# Patient Record
Sex: Female | Born: 1940
Health system: Midwestern US, Community
[De-identification: ages and names within clinical notes are randomized; demographics above are authoritative.]

## PROBLEM LIST (undated history)

## (undated) DIAGNOSIS — T8543XA Leakage of breast prosthesis and implant, initial encounter: Secondary | ICD-10-CM

## (undated) DIAGNOSIS — M79604 Pain in right leg: Secondary | ICD-10-CM

## (undated) DIAGNOSIS — M1990 Unspecified osteoarthritis, unspecified site: Secondary | ICD-10-CM

## (undated) DIAGNOSIS — M159 Polyosteoarthritis, unspecified: Secondary | ICD-10-CM

## (undated) DIAGNOSIS — E7849 Other hyperlipidemia: Principal | ICD-10-CM

## (undated) DIAGNOSIS — R928 Other abnormal and inconclusive findings on diagnostic imaging of breast: Secondary | ICD-10-CM

## (undated) DIAGNOSIS — I10 Essential (primary) hypertension: Secondary | ICD-10-CM

## (undated) DIAGNOSIS — M0600 Rheumatoid arthritis without rheumatoid factor, unspecified site: Principal | ICD-10-CM

## (undated) DIAGNOSIS — D649 Anemia, unspecified: Secondary | ICD-10-CM

## (undated) DIAGNOSIS — G629 Polyneuropathy, unspecified: Secondary | ICD-10-CM

## (undated) DIAGNOSIS — Z79899 Other long term (current) drug therapy: Secondary | ICD-10-CM

## (undated) DIAGNOSIS — M79642 Pain in left hand: Secondary | ICD-10-CM

## (undated) DIAGNOSIS — E039 Hypothyroidism, unspecified: Principal | ICD-10-CM

## (undated) DIAGNOSIS — R5383 Other fatigue: Secondary | ICD-10-CM

## (undated) DIAGNOSIS — Z471 Aftercare following joint replacement surgery: Secondary | ICD-10-CM

## (undated) DIAGNOSIS — N1832 Chronic kidney disease, stage 3b (HCC): Principal | ICD-10-CM

## (undated) DIAGNOSIS — M19042 Primary osteoarthritis, left hand: Secondary | ICD-10-CM

## (undated) DIAGNOSIS — R200 Anesthesia of skin: Secondary | ICD-10-CM

## (undated) DIAGNOSIS — Z853 Personal history of malignant neoplasm of breast: Secondary | ICD-10-CM

## (undated) DIAGNOSIS — M79605 Pain in left leg: Secondary | ICD-10-CM

## (undated) DIAGNOSIS — N63 Unspecified lump in unspecified breast: Secondary | ICD-10-CM

## (undated) DIAGNOSIS — Z01818 Encounter for other preprocedural examination: Secondary | ICD-10-CM

## (undated) DIAGNOSIS — M150 Primary generalized (osteo)arthritis: Secondary | ICD-10-CM

## (undated) DIAGNOSIS — R635 Abnormal weight gain: Secondary | ICD-10-CM

## (undated) DIAGNOSIS — R7309 Other abnormal glucose: Secondary | ICD-10-CM

## (undated) DIAGNOSIS — M47816 Spondylosis without myelopathy or radiculopathy, lumbar region: Secondary | ICD-10-CM

## (undated) DIAGNOSIS — R202 Paresthesia of skin: Secondary | ICD-10-CM

## (undated) DIAGNOSIS — Z1239 Encounter for other screening for malignant neoplasm of breast: Secondary | ICD-10-CM

## (undated) DIAGNOSIS — B029 Zoster without complications: Secondary | ICD-10-CM

## (undated) DIAGNOSIS — M25442 Effusion, left hand: Secondary | ICD-10-CM

## (undated) DIAGNOSIS — D509 Iron deficiency anemia, unspecified: Secondary | ICD-10-CM

## (undated) DIAGNOSIS — G63 Polyneuropathy in diseases classified elsewhere: Secondary | ICD-10-CM

## (undated) DIAGNOSIS — M810 Age-related osteoporosis without current pathological fracture: Principal | ICD-10-CM

---

## 2009-03-16 LAB — URINALYSIS WITH MICROSCOPIC
Bilirubin, Urine: NEGATIVE
Blood, Urine: NEGATIVE
Glucose, UA: NEGATIVE mg/dl
Ketones, Urine: NEGATIVE mg/dl
Nitrite, Urine: POSITIVE
Protein, UA: NEGATIVE mg/dl
Specific Gravity, UA: 1.01
Urobilinogen, Urine: 0.2 EU/dl (ref <2.0)
pH, UA: 6 (ref 4.5–8.0)

## 2009-03-16 LAB — BASIC METABOLIC PANEL
BUN: 23 mg/dL — ABNORMAL HIGH (ref 7–18)
CO2: 31 meq/L (ref 21–32)
Calcium: 10 mg/dL (ref 8.3–10.6)
Chloride: 103 meq/L (ref 99–110)
Creatinine: 1 mg/dL (ref 0.6–1.2)
Est, Glom Filt Rate: 58 — ABNORMAL LOW (ref >60)
GFR Est, African/Amer: >60
Glucose: 106 mg/dL — ABNORMAL HIGH (ref 70–99)
Potassium: 4.3 meq/L (ref 3.5–5.1)
Sodium: 141 meq/L (ref 136–145)

## 2009-08-22 MED ORDER — POTASSIUM CHLORIDE CRYS ER 10 MEQ PO TBCR
10 | ORAL_TABLET | Freq: Every day | ORAL | 1.00 refills | 30.00 days | Status: AC
Start: 2009-08-22 — End: 2010-08-22

## 2009-08-22 MED ORDER — POTASSIUM CHLORIDE CRYS ER 10 MEQ PO TBCR
10 | ORAL_TABLET | Freq: Every day | ORAL | 1.00 refills | 30.00 days | Status: DC
Start: 2009-08-22 — End: 2009-08-22

## 2009-08-25 LAB — COMPREHENSIVE METABOLIC PANEL
ALT: 22 U/L (ref 10–50)
AST: 27 U/L (ref 5–34)
Albumin/Globulin Ratio: 1.6 (ref 1.0–2.0)
Albumin: 4.4 g/dL (ref 3.5–5.2)
Alkaline Phosphatase: 90 U/L (ref 42–98)
BUN: 19 mg/dL (ref 6–20)
CO2: 25 mEq/L (ref 21–31)
Calcium: 10 mg/dL (ref 8.6–10.5)
Chloride: 101 mEq/L (ref 98–107)
Creatinine: 1.2 mg/dL — ABNORMAL HIGH (ref 0.6–1.1)
Est, Glom Filt Rate: 47.2 — ABNORMAL LOW (ref >60.0)
Glucose: 92 mg/dL (ref 70–110)
Potassium: 3.8 mEq/L (ref 3.6–5.0)
Sodium: 142 mEq/L (ref 135–145)
Total Bilirubin: 0.3 mg/dL (ref 0.3–1.2)
Total Protein: 7.1 g/dL (ref 6.0–8.3)

## 2009-08-25 LAB — CHOLESTEROL, TOTAL: Cholesterol, Total: 211 mg/dL — ABNORMAL HIGH (ref 0–200)

## 2009-08-25 LAB — T4, FREE: T4 Free: 1.2 ng/dL (ref 0.9–1.8)

## 2009-08-25 LAB — LDL CHOLESTEROL, DIRECT: LDL Direct: 119 mg/dl — ABNORMAL HIGH (ref 0–99)

## 2009-08-25 LAB — TSH: TSH: 0.262 u[IU]/mL — ABNORMAL LOW (ref 0.400–4.00)

## 2009-11-01 MED ORDER — SYNTHROID 112 MCG PO TABS
112 | ORAL_TABLET | ORAL | 1.00 refills | 90.00 days | Status: DC
Start: 2009-11-01 — End: 2010-03-18

## 2009-12-13 MED ORDER — OMEPRAZOLE 20 MG PO CPDR
20 | ORAL_CAPSULE | ORAL | 1.00 refills | 90.00 days | Status: DC
Start: 2009-12-13 — End: 2010-07-17

## 2009-12-22 MED ORDER — TRIAMTERENE-HCTZ 37.5-25 MG PO CAPS
37.5-25 | ORAL_CAPSULE | ORAL | 1.00 refills | Status: DC
Start: 2009-12-22 — End: 2010-07-17

## 2010-02-02 LAB — BASIC METABOLIC PANEL
BUN: 22 mg/dl — ABNORMAL HIGH (ref 7–18)
CO2: 29 mEq/L (ref 21–32)
Calcium: 10.2 mg/dl (ref 8.3–10.6)
Chloride: 102 mEq/L (ref 99–110)
Creatinine: 1.2 mg/dl (ref 0.6–1.2)
GFR Est, African/Amer: 57 — ABNORMAL LOW
GFR, Estimated: 47 — ABNORMAL LOW (ref >60)
Glucose: 103 mg/dl — ABNORMAL HIGH (ref 70–99)
Potassium: 3.7 mEq/L (ref 3.5–5.1)
Sodium: 138 mEq/L (ref 136–145)

## 2010-02-02 LAB — CBC WITH AUTO DIFFERENTIAL
Basophils %: 1 % (ref 0.0–2.0)
Basophils Absolute: 0.1 10*3 (ref 0.0–0.2)
Eosinophils %: 4.3 % (ref 0.0–5.0)
Eosinophils Absolute: 0.3 10*3 (ref 0.0–0.6)
Granulocyte Absolute Count: 4.2 10*3 (ref 1.7–7.7)
Hematocrit: 40.9 % (ref 36.0–48.0)
Hemoglobin: 13.7 gm/dl (ref 12.0–16.0)
Lymphocytes %: 27.4 % (ref 25.0–40.0)
Lymphocytes Absolute: 1.9 10*3 (ref 1.0–5.1)
MCH: 29.6 pg (ref 26–34)
MCHC: 33.6 gm/dl (ref 31–36)
MCV: 88.1 fl (ref 80–100)
MPV: 8.5 fl (ref 5.0–10.5)
Monocytes %: 6.9 % (ref 0.0–12.0)
Monocytes Absolute: 0.5 10*3 (ref 0.0–1.3)
Platelets: 262 10*3 (ref 135–450)
RBC: 4.64 10*6 (ref 4.0–5.2)
RDW: 12.1 % (ref 11.5–14.5)
Segs Relative: 60.4 % (ref 42.0–63.0)
WBC: 7 10*3 (ref 4.0–11.0)

## 2010-02-02 LAB — CK: Total CK: 207 U/L — ABNORMAL HIGH (ref 26–140)

## 2010-02-02 LAB — TROPONIN: Troponin I: <0.05 ng/ml (ref <0.39)

## 2010-02-02 LAB — CK-MB INDEX: CK-MB: 1.4 ng/ml (ref <6.0)

## 2010-02-02 NOTE — ED Provider Notes (Unsigned)
PATIENT NAME:                 PA #:            MR #Jodi Walls, Jodi Walls                [id number]       [id number]            EMERGENCY ROOM PHYSICIAN:                ADM DATE:                    Ralene Cork, MD              02/02/2010                   DATE OF BIRTH:   AGE:           PATIENT TYPE:     RM #:              February 19, 1941       69             MEQ                                     CHIEF COMPLAINT:  Chest pain.     HISTORY OF PRESENT ILLNESS:  The patient is a 69 year old female who comes in  with chest pain with movement to her chest wall this morning.  It happened  around 10:00.  It is diffusely across her chest with movement.  There was  some pain to the upper part of her back.  Of significance is that the patient  was doing a lot of carrying of mulch yesterday and a lot of gardening as the  weather was very nice yesterday.  Otherwise, no other complaints are noted.   Pain level is approximately 4/10.  No shortness of breath; able to eat and  drink without any problems.  She has already taken her aspirin this morning  around 8:00.     ALLERGIES:  CODEINE.     PAST MEDICAL/SURGICAL HISTORY:  Hypertension and breast CA, and mastectomy.     SOCIAL HISTORY:  Negative smoke.  Occasional EtOH.  Lives with family.     FAMILY HISTORY:  Positive for hypertension.     REVIEW OF SYSTEMS:  GENERAL:  No fever, chills, malaise.  NEUROLOGIC:  No headache, visual disturbance.  CARDIOVASCULAR/RESPIRATORY:  Has had chest wall pain.  GASTROINTESTINAL:  No nausea, vomiting, diarrhea or constipation.  GENITOURINARY:  No dysuria.  MUSCULOSKELETAL:  No calf swelling or pain.  SKIN:  Negative for rash.  PSYCHIATRIC:  Negative for anxiety.     The rest of the systems were reviewed and negative.     PHYSICAL EXAMINATION:  GENERAL:  The patient is awake, alert, and interactive.  VITAL SIGNS:  Blood pressure 153/76, pulse 62, respirations 16, temperature  98.  Pulse oximetry 99%.  Pain level is  4/10.  HEENT:  Head normocephalic, atraumatic.  EOMI.  PERRLA.  Ears clear.  Nose  clear.  Oropharynx clear.  The patient uvula is midline.   NECK:  Supple.  There are no meningeal signs.  No cervical point tenderness  noted.  LUNGS:  Bilateral breath  sounds.  No wheeze.  CARDIOVASCULAR:  RRR.  No murmurs, rubs or gallops noted.   CHEST:  Chest wall has reproducible pain.  No stepoff noted.  ABDOMEN:  Soft, nontender, nondistended.  Positive bowel sounds x4.  No  rebound.  No pulsatile masses noted.  EXTREMITIES:  Positive pulses, equal bilaterally.  No edema.   NEUROLOGIC:  Cranial nerves II-XII grossly intact.  No focal deficits noted.     DIFFERENTIAL DIAGNOSIS:  Chest pain versus chest wall pain.     MEDICAL DECISION MAKING/EMERGENCY DEPARTMENT COURSE:  The patient had EKG,  interpreted by me.  It shows normal sinus rhythm with a rate of 64.  I do not  appreciate any ST elevations noted.  This is compared to an old EKG  previously.  The patient had a chest x-ray that was read by Radiology as  negative.  The patient had blood work obtained.  It showed her CPK was  somewhat elevated, but she was doing a lot of manual labor with carrying  mulch and doing a lot of gardening.  It was 207.  Her MB was 1.4.  The ratio  was low.  Her troponin was less than 0.05.  Her BUN is 22, creatinine 1.2.   Potassium is 3.7.  The patients glucose is 103.  The patients white count is  7, hemoglobin 13.7 and hematocrit 40.9, so all her tests were basically  within normal limits.  She was monitored.  She felt fine except for when she  moved around, or with direct palpation to her chest.  In light of this, the  patient will be given Toradol 15 mg IV and will be discharged home on  ibuprofen or Tylenol, cool packs, and given written and verbal instructions.   She states that she understands and will comply and will follow up with her  doctor.  See chart for details.                                            [employer] [facility] [employer] Geoffry Paradise,  [place]     [id number]  DD: 02/02/2010   DT: 02/02/2010 15:51   Job #: [id number]  CC:

## 2010-02-20 MED ORDER — ATENOLOL 25 MG PO TABS
25 | ORAL_TABLET | ORAL | 1.00 refills | 90.00 days | Status: DC
Start: 2010-02-20 — End: 2010-09-14

## 2010-02-20 MED ORDER — POTASSIUM CHLORIDE CRYS ER 10 MEQ PO TBCR
10 | ORAL_TABLET | ORAL | 1.00 refills | 30.00 days | Status: DC
Start: 2010-02-20 — End: 2010-05-17

## 2010-03-20 MED ORDER — SYNTHROID 112 MCG PO TABS
112 | ORAL_TABLET | ORAL | 1.00 refills | 90.00 days | Status: DC
Start: 2010-03-20 — End: 2010-06-18

## 2010-04-06 MED ORDER — ALPRAZOLAM 0.25 MG PO TABS
0.25 | ORAL_TABLET | Freq: Two times a day (BID) | ORAL | 0.00 refills | 30.00 days | Status: DC | PRN
Start: 2010-04-06 — End: 2010-08-24

## 2010-04-06 NOTE — Progress Notes (Signed)
Subjective:      Patient ID: Jodi Walls is a 69 y.o. female.    HPI Comments: She is well and not c/o. She did have some atypical chest pain. Was in the er. All ok. She has not decided on the colon check still.   She did want a script for xanax to have at home.       Review of Systems   Constitutional: Negative for fever, appetite change, fatigue and unexpected weight change.   Respiratory: Negative for chest tightness and shortness of breath.    Cardiovascular: Negative for chest pain, palpitations and leg swelling.   Gastrointestinal: Negative for nausea, vomiting, abdominal pain, diarrhea, constipation, blood in stool and abdominal distention.   Genitourinary: Negative for dysuria, hematuria and difficulty urinating.   Neurological: Negative for headaches.       Objective:   Physical Exam   Constitutional: She appears well-developed and well-nourished. No distress.   Neck: No mass and no thyromegaly present.   Cardiovascular: Normal rate, regular rhythm and normal heart sounds.  Exam reveals no gallop and no friction rub.    No murmur heard.            Pulmonary/Chest: Effort normal and breath sounds normal. No respiratory distress. She has no wheezes. She has no rales.   Abdominal: Soft. She exhibits no distension and no mass. No tenderness. She has no guarding.   Musculoskeletal: She exhibits no edema.   Neurological: She is alert.   Skin: Skin is warm and dry. She is not diaphoretic.   Psychiatric: She has a normal mood and affect.       Assessment:      1. Essential hypertension, benign  Basic metabolic panel   2. Other and unspecified hyperlipidemia  Lipid panel   3. Unspecified hypothyroidism  T4, free, TSH           Plan:      She is advised to get the colon check. She was told of side effects of the xanax. See in about 4 months

## 2010-04-07 LAB — TSH
T4 Free: 1.44 ng/dl (ref 0.9–1.8)
TSH: 0.34 u[IU]/mL — ABNORMAL LOW (ref 0.35–5.5)

## 2010-04-07 LAB — LIPID PANEL
Cholesterol, Total: 224 mg/dl — ABNORMAL HIGH (ref <200)
HDL: 78 mg/dl — ABNORMAL HIGH (ref 40–60)
LDL Calculated: 126 mg/dl — ABNORMAL HIGH (ref <100)
Triglycerides: 97 mg/dl (ref <150)
VLDL Cholesterol Calculated: 19 mg/dl

## 2010-04-07 LAB — BASIC METABOLIC PANEL
BUN: 21 mg/dl — ABNORMAL HIGH (ref 7–18)
CO2: 30 mEq/L (ref 21–32)
Calcium: 9.8 mg/dl (ref 8.3–10.6)
Chloride: 102 mEq/L (ref 99–110)
Creatinine: 1.1 mg/dl (ref 0.6–1.2)
Est, Glom Filt Rate: 52 — ABNORMAL LOW (ref >60)
GFR Est, African/Amer: >60
Glucose: 111 mg/dl — ABNORMAL HIGH (ref 70–99)
Potassium: 4.1 mEq/L (ref 3.5–5.1)
Sodium: 143 mEq/L (ref 136–145)

## 2010-04-07 LAB — T4, FREE: T4 Free: 1.44 ng/dl (ref 0.9–1.8)

## 2010-05-18 MED ORDER — POTASSIUM CHLORIDE ER 10 MEQ PO TBCR
10 | ORAL_TABLET | ORAL | 1.00 refills | 30.00 days | Status: DC
Start: 2010-05-18 — End: 2011-02-15

## 2010-06-19 MED ORDER — LEVOTHYROXINE SODIUM 112 MCG PO TABS
112 | ORAL_TABLET | ORAL | 1.00 refills | 90.00 days | Status: DC
Start: 2010-06-19 — End: 2010-10-23

## 2010-07-17 MED ORDER — TRIAMTERENE-HCTZ 37.5-25 MG PO CAPS
37.5-25 | ORAL_CAPSULE | ORAL | 1.00 refills | Status: DC
Start: 2010-07-17 — End: 2011-01-16

## 2010-07-17 MED ORDER — OMEPRAZOLE 20 MG PO CPDR
20 | ORAL_CAPSULE | ORAL | 1.00 refills | 90.00 days | Status: DC
Start: 2010-07-17 — End: 2011-01-16

## 2010-08-24 MED ORDER — ALPRAZOLAM 0.25 MG PO TABS
0.25 | ORAL_TABLET | Freq: Two times a day (BID) | ORAL | 0.00 refills | 30.00 days | Status: DC | PRN
Start: 2010-08-24 — End: 2011-01-18

## 2010-08-24 NOTE — Progress Notes (Signed)
 Subjective:      Patient ID: Jodi Walls is a 70 y.o. female.    HPI Comments: Patient presents with:    Check-Up - pt fasting    She is well and no c/o.  She had a mammogram that was fine     height is 5' 2 (1.575 m) and weight is 210 lb (95.255 kg). Her oral temperature is 98.1 F (36.7 C). Her blood pressure is 138/80 and her pulse is 76.     Hx reviewed        Review of Systems   Constitutional: Negative for fever, chills, appetite change and unexpected weight change.   HENT: Negative for neck pain.    Eyes: Negative for visual disturbance.   Respiratory: Negative for cough, chest tightness and shortness of breath.    Cardiovascular: Negative for chest pain, palpitations and leg swelling.   Gastrointestinal: Negative for nausea, vomiting, abdominal pain, diarrhea, constipation, blood in stool and abdominal distention.   Genitourinary: Negative for dysuria, frequency, hematuria and difficulty urinating.   Neurological: Negative for headaches.       Objective:   Physical Exam   Constitutional: She appears well-developed and well-nourished. No distress.   Eyes: Conjunctivae are normal. No scleral icterus.   Neck: Carotid bruit is not present. No mass and no thyromegaly present.   Cardiovascular: Normal rate, regular rhythm and normal heart sounds.  Exam reveals no gallop and no friction rub.    No murmur heard.       No edema   Pulmonary/Chest: Effort normal and breath sounds normal. No respiratory distress. She has no wheezes. She has no rales.   Lymphadenopathy:     She has no cervical adenopathy.        Right: No supraclavicular adenopathy present.        Left: No supraclavicular adenopathy present.   Neurological: She is alert.   Skin: Skin is warm, dry and intact. She is not diaphoretic. No cyanosis. No pallor. Nails show no clubbing.   Psychiatric: She has a normal mood and affect. Her speech is normal.       Assessment:      1. Essential hypertension, benign  Comprehensive metabolic panel   2.  Unspecified hypothyroidism  T4, free, TSH   3. Other and unspecified hyperlipidemia  Cholesterol, total, Triglyceride           Plan:      Check the blood  We will have to write the mammogram in the future  See about 6 months

## 2010-08-25 LAB — COMPREHENSIVE METABOLIC PANEL
ALT: 32 U/L (ref 10–40)
AST: 36 U/L (ref 15–37)
Albumin/Globulin Ratio: 1.7 (ref 1.1–2.2)
Albumin: 4.5 gm/dl (ref 3.4–5.0)
Alkaline Phosphatase: 102 U/L (ref 45–129)
BUN: 18 mg/dl (ref 7–18)
CO2: 28 mEq/L (ref 21–32)
Calcium: 10.1 mg/dl (ref 8.3–10.6)
Chloride: 104 mEq/L (ref 99–110)
Creatinine: 1.1 mg/dl (ref 0.6–1.2)
Est, Glom Filt Rate: 52 — ABNORMAL LOW (ref >60)
GFR Est, African/Amer: >60
Glucose: 110 mg/dl — ABNORMAL HIGH (ref 70–99)
Potassium: 3.9 mEq/L (ref 3.5–5.1)
Sodium: 142 mEq/L (ref 136–145)
Total Bilirubin: 0.5 mg/dl (ref 0.0–1.0)
Total Protein: 7.2 gm/dl (ref 6.4–8.2)

## 2010-08-25 LAB — TSH
T4 Free: 1.61 ng/dl (ref 0.9–1.8)
TSH: 0.05 u[IU]/mL — ABNORMAL LOW (ref 0.35–5.5)

## 2010-08-25 LAB — T4, FREE: T4 Free: 1.61 ng/dl (ref 0.9–1.8)

## 2010-08-25 LAB — CHOLESTEROL, TOTAL: Cholesterol, Total: 232 mg/dl — ABNORMAL HIGH (ref <200)

## 2010-08-25 LAB — TRIGLYCERIDES: Triglycerides: 77 mg/dl (ref <150)

## 2010-09-14 MED ORDER — ATENOLOL 25 MG PO TABS
25 | ORAL_TABLET | ORAL | 1.00 refills | 90.00 days | Status: DC
Start: 2010-09-14 — End: 2011-03-19

## 2010-10-23 MED ORDER — LEVOTHYROXINE SODIUM 100 MCG PO TABS
100 MCG | ORAL_TABLET | Freq: Every day | ORAL | Status: DC
Start: 2010-10-23 — End: 2011-02-23

## 2010-10-23 NOTE — Telephone Encounter (Signed)
Pt said we can call the thyroid med into [facility] in [place].

## 2011-01-16 MED ORDER — OMEPRAZOLE 20 MG PO CPDR
20 MG | ORAL_CAPSULE | ORAL | Status: DC
Start: 2011-01-16 — End: 2011-06-18

## 2011-01-16 MED ORDER — TRIAMTERENE-HCTZ 37.5-25 MG PO CAPS
37.5-25 | ORAL_CAPSULE | ORAL | 1.00 refills | Status: DC
Start: 2011-01-16 — End: 2011-06-18

## 2011-01-18 MED ORDER — ALPRAZOLAM 0.25 MG PO TABS
0.25 MG | ORAL_TABLET | Freq: Two times a day (BID) | ORAL | Status: DC | PRN
Start: 2011-01-18 — End: 2011-08-21

## 2011-01-18 NOTE — Telephone Encounter (Signed)
Pharmacy called.

## 2011-02-15 MED ORDER — POTASSIUM CHLORIDE ER 10 MEQ PO TBCR
10 | ORAL_TABLET | ORAL | 1.00 refills | 30.00 days | Status: DC
Start: 2011-02-15 — End: 2011-08-22

## 2011-02-22 NOTE — Progress Notes (Signed)
Subjective:      Patient ID: Jodi Walls is a 70 y.o. female.    HPI Comments: Patient presents with:    Check-Up    She is well and is wanting flu shot  No pb with the flu shot in the  Past  Dr fox wants to replace the right knee. Patient feels well now and she is not sure she wants    ----------------------------                02/22/11                       1116         ----------------------------   BP:           122/78         Pulse:          60           Temp:   97.9 F (36.6 C)    TempSrc:       Oral          Height:  5\' 2"  (1.575 m)     Weight: 206 lb (93.441 kg)  ----------------------------  Wt Readings from Last 3 Encounters:  02/22/11 : 206 lb (93.441 kg)  08/24/10 : 210 lb (95.255 kg)  04/06/10 : 208 lb (94.348 kg)  BP Readings from Last 3 Encounters:  02/22/11 : 122/78  08/24/10 : 138/80  04/06/10 : 140/80        Review of Systems   Constitutional: Negative for fever, chills, appetite change and unexpected weight change.   Eyes: Negative for visual disturbance.   Respiratory: Negative for cough, chest tightness and shortness of breath.    Cardiovascular: Negative for chest pain, palpitations and leg swelling.   Gastrointestinal: Negative for nausea, vomiting, abdominal pain, diarrhea, constipation, blood in stool and abdominal distention.        No gerd no dysphagia   Genitourinary: Negative for dysuria, frequency, hematuria and difficulty urinating.   Neurological: Negative for dizziness, light-headedness and headaches.       Objective:   Physical Exam   Constitutional: She appears well-developed and well-nourished. No distress.   HENT:   Head: Normocephalic and atraumatic.   Mouth/Throat: Uvula is midline, oropharynx is clear and moist and mucous membranes are normal.   Eyes: Conjunctivae and lids are normal. No scleral icterus.   Neck: Neck supple. Carotid bruit is not present. No mass and no thyromegaly present.   Cardiovascular: Normal rate, regular rhythm and  normal heart sounds.  Exam reveals no gallop and no friction rub.    No murmur heard.         No edema legs   Pulmonary/Chest: Effort normal and breath sounds normal. No respiratory distress. She has no wheezes. She has no rales.   Abdominal: Soft. Normal appearance and bowel sounds are normal. She exhibits no distension, no abdominal bruit and no mass. There is no hepatosplenomegaly. There is no tenderness. There is no guarding.   Lymphadenopathy:        Head (right side): No submental, no submandibular, no preauricular and no posterior auricular adenopathy present.        Head (left side): No submental, no submandibular, no preauricular and no posterior auricular adenopathy present.     She has no cervical adenopathy.        Right: No supraclavicular adenopathy present.        Left: No supraclavicular  adenopathy present.   Neurological: She is alert.   Skin: Skin is warm, dry and intact. She is not diaphoretic. No cyanosis. No pallor. Nails show no clubbing.   Psychiatric: She has a normal mood and affect. Her speech is normal.       Assessment:      1. Essential hypertension, benign  Comprehensive metabolic panel   2. Unspecified hypothyroidism  T4, free, TSH   3. Other and unspecified hyperlipidemia  Comprehensive metabolic panel, TSH, Triglyceride, Cholesterol, total   4. Immunization, active; influenza virus vaccine  Flu Vaccine, High Dose Flu Zone IM           Plan:      Flu shot   See in 6 months  Stay with the diet  No change with the diet

## 2011-02-23 LAB — COMPREHENSIVE METABOLIC PANEL
ALT: 26 U/L (ref 10–40)
AST: 33 U/L (ref 15–37)
Albumin/Globulin Ratio: 1.7 (ref 1.1–2.2)
Albumin: 4.4 gm/dl (ref 3.4–5.0)
Alkaline Phosphatase: 96 U/L (ref 45–129)
BUN: 24 mg/dl — ABNORMAL HIGH (ref 7–18)
CO2: 29 mEq/L (ref 21–32)
Calcium: 9.6 mg/dl (ref 8.3–10.6)
Chloride: 105 mEq/L (ref 99–110)
Creatinine: 1.2 mg/dl (ref 0.6–1.2)
Est, Glom Filt Rate: 47 — ABNORMAL LOW (ref >60)
GFR Est, African/Amer: 57 — ABNORMAL LOW
Glucose: 103 mg/dl — ABNORMAL HIGH (ref 70–99)
Potassium: 4.7 mEq/L (ref 3.5–5.1)
Sodium: 142 mEq/L (ref 136–145)
Total Bilirubin: 0.6 mg/dl (ref 0.0–1.0)
Total Protein: 7.1 gm/dl (ref 6.4–8.2)

## 2011-02-23 LAB — CHOLESTEROL, TOTAL: Cholesterol, Total: 224 mg/dl — ABNORMAL HIGH (ref <200)

## 2011-02-23 LAB — T4, FREE: T4 Free: 1.57 ng/dl (ref 0.9–1.8)

## 2011-02-23 LAB — TSH
T4 Free: 1.57 ng/dl (ref 0.9–1.8)
TSH: 0.19 u[IU]/mL — ABNORMAL LOW (ref 0.35–5.5)

## 2011-02-23 LAB — TRIGLYCERIDES: Triglycerides: 80 mg/dl (ref <150)

## 2011-02-23 MED ORDER — LEVOTHYROXINE SODIUM 75 MCG PO TABS
75 MCG | ORAL_TABLET | Freq: Every day | ORAL | Status: DC
Start: 2011-02-23 — End: 2011-08-15

## 2011-02-23 NOTE — Telephone Encounter (Signed)
Spoke to pt about her results, she said she would need new thyroid med called into [facility] in [place]. ( levothyroxine 75mg )

## 2011-03-19 MED ORDER — ATENOLOL 25 MG PO TABS
25 | ORAL_TABLET | ORAL | 1.00 refills | 90.00 days | Status: DC
Start: 2011-03-19 — End: 2011-08-15

## 2011-06-18 MED ORDER — TRIAMTERENE-HCTZ 37.5-25 MG PO CAPS
37.5-25 | ORAL_CAPSULE | ORAL | 1.00 refills | Status: DC
Start: 2011-06-18 — End: 2011-09-19

## 2011-06-18 MED ORDER — OMEPRAZOLE 20 MG PO CPDR
20 MG | ORAL_CAPSULE | ORAL | Status: DC
Start: 2011-06-18 — End: 2011-09-19

## 2011-08-16 MED ORDER — LEVOTHYROXINE SODIUM 75 MCG PO TABS
75 MCG | ORAL_TABLET | ORAL | Status: DC
Start: 2011-08-16 — End: 2011-09-19

## 2011-08-16 MED ORDER — ATENOLOL 25 MG PO TABS
25 | ORAL_TABLET | ORAL | 1.00 refills | 90.00 days | Status: DC
Start: 2011-08-16 — End: 2011-09-19

## 2011-08-21 MED ORDER — ALPRAZOLAM 0.25 MG PO TABS
0.25 MG | ORAL_TABLET | Freq: Two times a day (BID) | ORAL | Status: DC | PRN
Start: 2011-08-21 — End: 2012-12-17

## 2011-08-23 LAB — URINALYSIS WITH MICROSCOPIC
Bilirubin Urine: NEGATIVE
Blood, Urine: NEGATIVE
Glucose, Ur: NEGATIVE mg/dl
Ketones, Urine: NEGATIVE mg/dl
Nitrite, Urine: NEGATIVE
Protein, UA: NEGATIVE mg/dl
Specific Gravity, UA: 1.015 (ref 1.005–1.030)
Urobilinogen, Urine: 0.2 EU/dl (ref <2.0)
pH, UA: 6.5 (ref 4.5–8.0)

## 2011-08-23 MED ORDER — GABAPENTIN 300 MG PO CAPS
300 | ORAL_CAPSULE | Freq: Every day | ORAL | 1.00 refills | 30.00 days | Status: DC
Start: 2011-08-23 — End: 2011-11-26

## 2011-08-23 MED ORDER — POTASSIUM CHLORIDE ER 10 MEQ PO TBCR
10 | ORAL_TABLET | ORAL | 1.00 refills | 30.00 days | Status: AC
Start: 2011-08-23 — End: ?

## 2011-08-23 NOTE — Progress Notes (Signed)
Subjective:      Patient ID: Jodi Walls is a 71 y.o. female.    HPI Comments: Patient presents with:    Check-Up - pt fasting    She has leg cramps mainly at night  Going on for more than 6 months  She drinks some club soda and that will help sometimes    She wonders if the knees could be doing it as she is supposed to have surgery  She has not done the colon test    Date of Birth:  [birth year]    Date of Visit:  08/23/2011     -- Codeine -- Nausea And Vomiting    Current Outpatient Prescriptions:  gabapentin (NEURONTIN) 300 MG capsule, Take 1 capsule by mouth Daily with supper., Disp: 30 capsule, Rfl: 3  ALPRAZolam (XANAX) 0.25 MG tablet, Take 1 tablet by mouth 2 times daily as needed for Anxiety., Disp: 30 tablet, Rfl: 0  levothyroxine (SYNTHROID) 75 MCG tablet, TAKE ONE TABLET BY MOUTH ONCE A DAY, Disp: 30 tablet, Rfl: 3  atenolol (TENORMIN) 25 MG tablet, TAKE ONE TABLET BY MOUTH ONCE A DAY, Disp: 30 tablet, Rfl: 3  triamterene-hydrochlorothiazide (DYAZIDE) 37.5-25 MG per capsule, TAKE ONE CAPSULE BY MOUTH ONCE A DAY, Disp: 30 capsule, Rfl: 3  omeprazole (PRILOSEC) 20 MG capsule, TAKE ONE CAPSULE BY MOUTH ONCE A DAY, Disp: 30 capsule, Rfl: 3  traMADol (ULTRAM) 50 MG tablet, Take 50 mg by mouth 2 times daily as needed.    Indications: Knee Pain, Disp: , Rfl:     No current facility-administered medications for this visit.      ----------------------------                08/23/11                       1019         ----------------------------   BP:           128/68         Pulse:          68           Temp:   97.8 F (36.6 C)    TempSrc:       Oral          Height:  5\' 2"  (1.575 m)     Weight: 218 lb (98.884 kg)  ----------------------------  Body mass index is 39.86 kg/(m^2).     Wt Readings from Last 3 Encounters:  08/23/11 : 218 lb (98.884 kg)  02/22/11 : 206 lb (93.441 kg)  08/24/10 : 210 lb (95.255 kg)  BP Readings from Last 3 Encounters:  08/23/11 : 128/68  02/22/11 :  122/78  08/24/10 : 138/80          Review of Systems   Constitutional: Negative for fever, chills, appetite change and unexpected weight change.   Eyes: Negative for visual disturbance.   Respiratory: Negative for cough, chest tightness and shortness of breath.    Cardiovascular: Negative for chest pain, palpitations and leg swelling.   Gastrointestinal: Negative for nausea, vomiting, abdominal pain, diarrhea, constipation, blood in stool and abdominal distention.        No gerd no dysphagia   Endocrine: Negative for polydipsia and polyuria.   Genitourinary: Negative for dysuria, frequency, hematuria and difficulty urinating.   Neurological: Negative for dizziness, light-headedness and headaches.       Objective:   Physical Exam   Constitutional: She  appears well-developed and well-nourished. No distress.   Neck: Full passive range of motion without pain. Neck supple. Carotid bruit is not present. No mass and no thyromegaly present.   Cardiovascular: Normal rate, regular rhythm and normal heart sounds.  Exam reveals no gallop and no friction rub.    No murmur heard.  Pulses:       Dorsalis pedis pulses are 2+ on the right side, and 2+ on the left side.        Posterior tibial pulses are 2+ on the right side, and 2+ on the left side.     No edema   Pulmonary/Chest: Effort normal and breath sounds normal. No accessory muscle usage. Not tachypneic. No respiratory distress. She has no decreased breath sounds. She has no wheezes. She has no rhonchi. She has no rales.   Abdominal: Soft. Normal appearance and bowel sounds are normal. She exhibits no distension, no abdominal bruit and no mass. There is no hepatosplenomegaly. There is no tenderness. There is no guarding and no tenderness at McBurney's point.   Musculoskeletal:   Feet pink warm and dry   Lymphadenopathy:        Head (right side): No submental, no submandibular, no preauricular and no posterior auricular adenopathy present.        Head (left side): No  submental, no submandibular, no preauricular and no posterior auricular adenopathy present.     She has no cervical adenopathy.        Right: No supraclavicular adenopathy present.        Left: No supraclavicular adenopathy present.   Neurological: She is alert.   Reflex Scores:       Patellar reflexes are 2+ on the right side and 2+ on the left side.       Achilles reflexes are 2+ on the right side and 2+ on the left side.  Skin: Skin is warm, dry and intact. No rash noted. She is not diaphoretic. No cyanosis. No pallor. Nails show no clubbing.   Psychiatric: She has a normal mood and affect. Her speech is normal.       Assessment:      1. Essential hypertension, benign  Comprehensive metabolic panel, Lipid panel, CBC, TSH, Urinalysis with microscopic   2. Unspecified hypothyroidism  TSH, T4, free   3. Other and unspecified hyperlipidemia  Comprehensive metabolic panel, Lipid panel, TSH   4. HX: breast cancer  Comprehensive metabolic panel, CBC, MAM Dexa Bone Density Scan   5. Postmenopausal status  MAM Dexa Bone Density Scan           Plan:      Wrote for the mammogram and copy to her doctor dr Elwin Mocha  Trial of neurontin at night  See [name] in about 4 months

## 2011-08-24 LAB — COMPREHENSIVE METABOLIC PANEL
ALT: 26 U/L (ref 10–40)
AST: 32 U/L (ref 15–37)
Albumin/Globulin Ratio: 1.5 (ref 1.1–2.2)
Albumin: 4.3 gm/dl (ref 3.4–5.0)
Alkaline Phosphatase: 108 U/L (ref 45–129)
BUN: 24 mg/dl — ABNORMAL HIGH (ref 7–18)
CO2: 35 mEq/L — ABNORMAL HIGH (ref 21–32)
Calcium: 10 mg/dl (ref 8.3–10.6)
Chloride: 104 mEq/L (ref 99–110)
Creatinine: 1.4 mg/dl — ABNORMAL HIGH (ref 0.6–1.2)
Est, Glom Filt Rate: 39 — ABNORMAL LOW (ref >60)
GFR Est, African/Amer: 48 — ABNORMAL LOW
Glucose: 110 mg/dl — ABNORMAL HIGH (ref 70–99)
Potassium: 4.5 mEq/L (ref 3.5–5.1)
Sodium: 142 mEq/L (ref 136–145)
Total Bilirubin: 0.5 mg/dl (ref 0.0–1.0)
Total Protein: 7.1 gm/dl (ref 6.4–8.2)

## 2011-08-24 LAB — CBC
Hematocrit: 41.4 % (ref 36.0–48.0)
Hemoglobin: 13.4 gm/dl (ref 12.0–16.0)
MCH: 28.7 pg (ref 26–34)
MCHC: 32.4 gm/dl (ref 31–36)
MCV: 88.5 fl (ref 80–100)
MPV: 10.4 fl (ref 5.0–10.5)
Platelets: 251 10*3 (ref 135–450)
RBC: 4.68 10*6 (ref 4.0–5.2)
RDW: 13.9 % (ref 11.5–14.5)
WBC: 6.6 10*3 (ref 4.0–11.0)

## 2011-08-24 LAB — LIPID PANEL
Cholesterol, Total: 231 mg/dl — ABNORMAL HIGH (ref <200)
HDL: 76 mg/dl — ABNORMAL HIGH (ref 40–60)
LDL Calculated: 136 mg/dl — ABNORMAL HIGH (ref <100)
Triglycerides: 95 mg/dl (ref <150)
VLDL Cholesterol Calculated: 19 mg/dl

## 2011-08-24 LAB — TSH
T4 Free: 1.02 ng/dl (ref 0.9–1.8)
TSH: 4.37 u[IU]/mL (ref 0.35–5.5)

## 2011-08-24 LAB — T4, FREE: T4 Free: 1.02 ng/dl (ref 0.9–1.8)

## 2011-09-20 MED ORDER — ATENOLOL 25 MG PO TABS
25 | ORAL_TABLET | ORAL | 1.00 refills | 90.00 days | Status: DC
Start: 2011-09-20 — End: 2012-03-18

## 2011-09-20 MED ORDER — POTASSIUM CHLORIDE CRYS ER 10 MEQ PO TBCR
10 | ORAL_TABLET | ORAL | 1.00 refills | 30.00 days | Status: DC
Start: 2011-09-20 — End: 2012-04-21

## 2011-09-20 MED ORDER — LEVOTHYROXINE SODIUM 75 MCG PO TABS
75 MCG | ORAL_TABLET | ORAL | Status: DC
Start: 2011-09-20 — End: 2011-12-24

## 2011-09-20 MED ORDER — OMEPRAZOLE 20 MG PO CPDR
20 MG | ORAL_CAPSULE | ORAL | Status: DC
Start: 2011-09-20 — End: 2012-04-16

## 2011-09-20 MED ORDER — TRIAMTERENE-HCTZ 37.5-25 MG PO CAPS
37.5-25 | ORAL_CAPSULE | ORAL | 1.00 refills | Status: DC
Start: 2011-09-20 — End: 2012-06-12

## 2011-10-11 NOTE — Patient Instructions (Signed)
Do take fluids  Check the blood  See [name] in 4 months

## 2011-10-11 NOTE — Progress Notes (Signed)
 Subjective:      Patient ID: Jodi Walls is a 71 y.o. female.    HPI Comments: Patient presents with:    Follow-up    The gabapentin  did help the leg cramps  It also helps with sleep    C/o appetite and weight gain     height is 5' 2 (1.575 m) and weight is 219 lb (99.338 kg). Her oral temperature is 98 F (36.7 C). Her blood pressure is 128/70.           Review of Systems   Constitutional: Negative for fever, chills, appetite change and unexpected weight change.   Respiratory: Negative for cough and shortness of breath.    Cardiovascular: Negative for chest pain.   Neurological: Negative for headaches.       Objective:   Physical Exam   Constitutional: She appears well-developed and well-nourished. No distress.   Cardiovascular: Normal rate, regular rhythm and normal heart sounds.  Exam reveals no gallop and no friction rub.    No murmur heard.  Pulmonary/Chest: Effort normal and breath sounds normal. No accessory muscle usage. Not tachypneic. No respiratory distress. She has no decreased breath sounds. She has no wheezes. She has no rhonchi. She has no rales.   Lymphadenopathy:     She has no cervical adenopathy.        Right: No supraclavicular adenopathy present.        Left: No supraclavicular adenopathy present.   Neurological: She is alert.   Skin: Skin is warm, dry and intact. She is not diaphoretic. No pallor.       Assessment:      1. Essential hypertension, benign  Basic Metabolic Panel   2. Weight gain  Basic Metabolic Panel, Hemoglobin A1c   3. Elevated glucose  Basic Metabolic Panel, Hemoglobin A1c           Plan:      Do take fluids  Check the blood  See us  in 4 months

## 2011-10-12 LAB — BASIC METABOLIC PANEL
BUN: 21 mg/dl — ABNORMAL HIGH (ref 7–18)
CO2: 32 mEq/L (ref 21–32)
Calcium: 9.9 mg/dl (ref 8.3–10.6)
Chloride: 105 mEq/L (ref 99–110)
Creatinine: 1.4 mg/dl — ABNORMAL HIGH (ref 0.6–1.2)
Est, Glom Filt Rate: 39 — ABNORMAL LOW (ref >60)
GFR Est, African/Amer: 48 — ABNORMAL LOW
Glucose: 102 mg/dl — ABNORMAL HIGH (ref 70–99)
Potassium: 5 mEq/L (ref 3.5–5.1)
Sodium: 142 mEq/L (ref 136–145)

## 2011-10-12 LAB — HEMOGLOBIN A1C
Estimated Avg Glucose: 116.9 mg/dL
Hemoglobin A1C: 5.7

## 2011-10-25 NOTE — Telephone Encounter (Signed)
Information faxed

## 2011-10-25 NOTE — Telephone Encounter (Signed)
Pl fax over the last 3 office notes, current medication list, any imaging and the last 5 blood work results to [phone].  Pt has an appt on Aug. 12,

## 2011-11-26 MED ORDER — GABAPENTIN 300 MG PO CAPS
300 | ORAL_CAPSULE | ORAL | 1.00 refills | 30.00 days | Status: DC
Start: 2011-11-26 — End: 2011-12-27

## 2011-12-20 LAB — PROTEIN, URINE, RANDOM: Protein, Ur: 4.19 mg/dL (ref 0.00–12.00)

## 2011-12-20 LAB — CBC WITH AUTO DIFFERENTIAL
Basophils %: 0.5 %
Basophils Absolute: 0 10*3/uL (ref 0.0–0.2)
Eosinophils %: 4.1 %
Eosinophils Absolute: 0.2 10*3/uL (ref 0.0–0.6)
Hematocrit: 36.7 % (ref 36.0–48.0)
Hemoglobin: 12.2 g/dL (ref 12.0–16.0)
Lymphocytes %: 28.4 %
Lymphocytes Absolute: 1.6 10*3/uL (ref 1.0–5.1)
MCH: 28 pg (ref 26.0–34.0)
MCHC: 33.2 g/dL (ref 31.0–36.0)
MCV: 84.4 fL (ref 80.0–100.0)
MPV: 9.6 fL (ref 5.0–10.5)
Monocytes %: 6.3 %
Monocytes Absolute: 0.4 10*3/uL (ref 0.0–1.3)
Neutrophils %: 60.7 %
Neutrophils Absolute: 3.4 10*3/uL (ref 1.7–7.7)
Platelets: 229 10*3/uL (ref 135–450)
RBC: 4.35 M/uL (ref 4.00–5.20)
RDW: 14.3 % (ref 12.4–15.4)
WBC: 5.6 10*3/uL (ref 4.0–11.0)

## 2011-12-20 LAB — BASIC METABOLIC PANEL
BUN: 19 mg/dL — ABNORMAL HIGH (ref 7–18)
CO2: 26 mEq/L (ref 21–32)
Calcium: 9.4 mg/dL (ref 8.3–10.6)
Chloride: 103 mEq/L (ref 99–110)
Creatinine: 1.1 mg/dL (ref 0.6–1.2)
GFR African American: >60 (ref >60)
GFR Non-African American: 52 — AB (ref >60)
Glucose: 107 mg/dL — ABNORMAL HIGH (ref 70–99)
Potassium: 3.5 mEq/L (ref 3.5–5.1)
Sodium: 140 mEq/L (ref 136–145)

## 2011-12-20 LAB — MAGNESIUM: Magnesium: 2 mg/dL (ref 1.8–2.4)

## 2011-12-20 LAB — PHOSPHORUS: Phosphorus: 3.3 mg/dL (ref 2.5–4.9)

## 2011-12-20 LAB — CREATININE, RANDOM URINE: Creatinine, Ur: 37 mg/dL

## 2011-12-21 LAB — ELECTROPHORESIS PROTEIN, SERUM
Albumin: 3.6 g/dL (ref 3.0–5.1)
Alpha-1-Globulin: 0.2 g/dL (ref 0.1–0.4)
Alpha-2-Globulin: 1 g/dL (ref 0.6–1.2)
Beta Globulin: 1 g/dL (ref 0.7–1.3)
Gamma Globulin: 1 g/dL (ref 0.6–2.0)
Total Protein: 6.8 g/dL (ref 6.4–8.2)

## 2011-12-22 LAB — VITAMIN D 25 HYDROXY: Vit D, 25-Hydroxy: 21 ng/mL — ABNORMAL LOW (ref 30–80)

## 2011-12-24 MED ORDER — LEVOTHYROXINE SODIUM 75 MCG PO TABS
75 MCG | ORAL_TABLET | ORAL | Status: DC
Start: 2011-12-24 — End: 2012-09-30

## 2011-12-27 MED ORDER — GABAPENTIN 300 MG PO CAPS
300 | ORAL_CAPSULE | ORAL | 1.00 refills | 30.00 days | Status: DC
Start: 2011-12-27 — End: 2011-12-27

## 2011-12-28 MED ORDER — GABAPENTIN 300 MG PO CAPS
300 | ORAL_CAPSULE | ORAL | 1.00 refills | 30.00 days | Status: DC
Start: 2011-12-28 — End: 2012-09-30

## 2012-02-14 NOTE — Progress Notes (Signed)
 Subjective:      Patient ID: Jodi Walls is a 71 y.o. female.    HPI Comments: Patient presents with:    Check-Up - pt fasting    She is well  She saw the renal fellow  She is having a breast bx in the future  She is on the same meds    Date of Birth:  07/19/1940    Date of Visit:  02/14/2012     -- Codeine -- Nausea And Vomiting    Current Outpatient Prescriptions:  gabapentin  (NEURONTIN ) 300 MG capsule, TAKE ONE CAPSULE BY MOUTH EVERY DAY WITH SUPPER, Disp: 90 capsule, Rfl: 3  levothyroxine  (SYNTHROID ) 75 MCG tablet, TAKE ONE TABLET BY MOUTH ONCE A DAY, Disp: 30 tablet, Rfl: 4  atenolol  (TENORMIN ) 25 MG tablet, TAKE 1 TABLET BY MOUTH DAILY, Disp: 90 tablet, Rfl: 1  omeprazole  (PRILOSEC) 20 MG capsule, TAKE ONE CAPSULE BY MOUTH ONCE A DAY, Disp: 90 capsule, Rfl: 1  potassium chloride  SA (K-DUR;KLOR-CON  M) 10 MEQ tablet, TAKE ONE TABLET BY MOUTH ONCE A DAY, Disp: 90 tablet, Rfl: 1  ALPRAZolam  (XANAX ) 0.25 MG tablet, Take 1 tablet by mouth 2 times daily as needed for Anxiety., Disp: 30 tablet, Rfl: 0  traMADol  (ULTRAM ) 50 MG tablet, Take 50 mg by mouth 2 times daily as needed.    Indications: Knee Pain, Disp: , Rfl:   triamterene -hydrochlorothiazide (DYAZIDE) 37.5-25 MG per capsule, TAKE ONE CAPSULE BY MOUTH ONCE A DAY, Disp: 90 capsule, Rfl: 1    No current facility-administered medications for this visit.      ----------------------------                02/14/12                       1016         ----------------------------   BP:           130/80         Pulse:          48           Temp:   97.9 F (36.6 C)    TempSrc:       Oral          Height:  5' 2 (1.575 m)     Weight: 220 lb (99.791 kg)  ----------------------------  Body mass index is 40.23 kg/(m^2).     Wt Readings from Last 3 Encounters:  02/14/12 : 220 lb (99.791 kg)  10/11/11 : 219 lb (99.338 kg)  08/23/11 : 218 lb (98.884 kg)  BP Readings from Last 3 Encounters:  02/14/12 : 130/80  10/11/11 : 128/70  08/23/11 :  128/68        Review of Systems   Constitutional: Negative for fever and chills.   Respiratory: Negative for cough and shortness of breath.    Cardiovascular: Negative for chest pain, palpitations and leg swelling.   Gastrointestinal: Negative for abdominal pain.   Neurological: Negative for dizziness, light-headedness and headaches.       Objective:   Physical Exam   Constitutional: She appears well-developed and well-nourished. No distress.   Eyes: Conjunctivae are normal. No scleral icterus.   Cardiovascular: Normal rate, regular rhythm and normal heart sounds.  Exam reveals no gallop and no friction rub.    No murmur heard.  Pulmonary/Chest: Effort normal and breath sounds normal. No accessory muscle usage. Not tachypneic. No respiratory distress. She has no decreased  breath sounds. She has no wheezes. She has no rhonchi. She has no rales.   Lymphadenopathy:     She has no cervical adenopathy.        Right: No supraclavicular adenopathy present.        Left: No supraclavicular adenopathy present.   Neurological: She is alert.   Skin: Skin is warm, dry and intact. She is not diaphoretic. No pallor.       Assessment:      1. Essential hypertension, benign  EKG 12 lead, CBC, Basic Metabolic Panel   2. Other and unspecified hyperlipidemia  Lipid panel   3. Bradycardia  EKG 12 lead   4. Needs flu shot  FLU VACCINE HIGH DOSE (FLUZONE HIGH-DOSE) IM   5. Renal insufficiency  CBC, Basic Metabolic Panel     Reviewed old ekg and there is change from [place]  She is asymptomatic  Send back to the cardiologist      Plan:      Stop the atenolol   See me in one week

## 2012-02-14 NOTE — Progress Notes (Signed)
Risks and benefits explained, Current VIS given, Consent signed      Per dr Modesto Charon made pt an appt with [facility].  Ann NP, will see her tomorrow at [place] office at 1030 am. Pt advised, faxed ekg to them and sent copy with pt.

## 2012-02-15 LAB — BASIC METABOLIC PANEL
BUN: 21 mg/dL — ABNORMAL HIGH (ref 7–18)
CO2: 29 mEq/L (ref 21–32)
Calcium: 9.8 mg/dL (ref 8.3–10.6)
Chloride: 103 mEq/L (ref 99–110)
Creatinine: 1.2 mg/dL (ref 0.6–1.2)
GFR African American: 57 — AB (ref >60)
GFR Non-African American: 47 — AB (ref >60)
Glucose: 108 mg/dL — ABNORMAL HIGH (ref 70–99)
Potassium: 4.1 mEq/L (ref 3.5–5.1)
Sodium: 142 mEq/L (ref 136–145)

## 2012-02-15 LAB — CBC
Hematocrit: 40.3 % (ref 36.0–48.0)
Hemoglobin: 13.1 g/dL (ref 12.0–16.0)
MCH: 27.8 pg (ref 26.0–34.0)
MCHC: 32.5 g/dL (ref 31.0–36.0)
MCV: 85.6 fL (ref 80.0–100.0)
MPV: 10.6 fL — ABNORMAL HIGH (ref 5.0–10.5)
Platelets: 268 10*3/uL (ref 135–450)
RBC: 4.71 M/uL (ref 4.00–5.20)
RDW: 14.1 % (ref 12.4–15.4)
WBC: 6.8 10*3/uL (ref 4.0–11.0)

## 2012-02-15 LAB — LIPID PANEL
Cholesterol, Total: 246 mg/dL — ABNORMAL HIGH (ref 0–199)
HDL: 87 mg/dL — ABNORMAL HIGH (ref 40–60)
LDL Calculated: 142 mg/dL — ABNORMAL HIGH (ref 0–99)
Triglycerides: 86 mg/dL (ref 0–149)
VLDL Cholesterol Calculated: 17 mg/dL

## 2012-02-18 NOTE — Patient Instructions (Signed)
Do follow up with the cardiologist  See me about a month

## 2012-02-18 NOTE — Progress Notes (Signed)
 Subjective:      Patient ID: Jodi Walls is a 71 y.o. female.    HPI Comments: Patient presents with:    Follow-up - not taking the atenolol  still as directed.     She is well and off the atenolol   She did see the cardiology group and is having a w/u    Date of Birth:  July 12, 1940    Date of Visit:  02/18/2012     -- Codeine -- Nausea And Vomiting    Current Outpatient Prescriptions:  gabapentin  (NEURONTIN ) 300 MG capsule, TAKE ONE CAPSULE BY MOUTH EVERY DAY WITH SUPPER, Disp: 90 capsule, Rfl: 3  levothyroxine  (SYNTHROID ) 75 MCG tablet, TAKE ONE TABLET BY MOUTH ONCE A DAY, Disp: 30 tablet, Rfl: 4  triamterene -hydrochlorothiazide (DYAZIDE) 37.5-25 MG per capsule, TAKE ONE CAPSULE BY MOUTH ONCE A DAY, Disp: 90 capsule, Rfl: 1  omeprazole  (PRILOSEC) 20 MG capsule, TAKE ONE CAPSULE BY MOUTH ONCE A DAY, Disp: 90 capsule, Rfl: 1  potassium chloride  SA (K-DUR;KLOR-CON  M) 10 MEQ tablet, TAKE ONE TABLET BY MOUTH ONCE A DAY, Disp: 90 tablet, Rfl: 1  ALPRAZolam  (XANAX ) 0.25 MG tablet, Take 1 tablet by mouth 2 times daily as needed for Anxiety., Disp: 30 tablet, Rfl: 0  traMADol  (ULTRAM ) 50 MG tablet, Take 50 mg by mouth 2 times daily as needed.    Indications: Knee Pain, Disp: , Rfl:   atenolol  (TENORMIN ) 25 MG tablet, TAKE 1 TABLET BY MOUTH DAILY, Disp: 90 tablet, Rfl: 1    No current facility-administered medications for this visit.      ---------------------------------------                02/18/12      02/18/12                   1457          1516     ---------------------------------------   BP:           128/80        140/88     Pulse:                        64       Temp:   97.7 F (36.5 C)              TempSrc:       Oral                    Height:  5' 2 (1.575 m)               Weight: 220 lb (99.791 kg)            ---------------------------------------  Body mass index is 40.23 kg/(m^2).     Wt Readings from Last 3 Encounters:  02/18/12 : 220 lb (99.791 kg)  02/14/12 : 220  lb (99.791 kg)  10/11/11 : 219 lb (99.338 kg)  BP Readings from Last 3 Encounters:  02/18/12 : 140/88  02/14/12 : 130/80  10/11/11 : 128/70        Review of Systems    Objective:   Physical Exam   Constitutional: She appears well-developed and well-nourished. No distress.   Cardiovascular: Normal rate, regular rhythm and normal heart sounds.  Exam reveals no gallop and no friction rub.    No murmur heard.  Pulmonary/Chest: Effort normal and breath sounds normal. No accessory muscle usage. Not tachypneic. No respiratory distress. She  has no decreased breath sounds. She has no wheezes. She has no rhonchi. She has no rales.   Lymphadenopathy:     She has no cervical adenopathy.        Right: No supraclavicular adenopathy present.        Left: No supraclavicular adenopathy present.   Neurological: She is alert.   Skin: Skin is warm, dry and intact. She is not diaphoretic. No pallor.       Assessment:      1. Essential hypertension, benign            Plan:      Do follow up with the cardiologist  See me about a month

## 2012-03-18 MED ORDER — ATENOLOL 25 MG PO TABS
25 | ORAL_TABLET | ORAL | 1.00 refills | 90.00 days | Status: DC
Start: 2012-03-18 — End: 2012-09-30

## 2012-04-16 MED ORDER — OMEPRAZOLE 20 MG PO CPDR
20 MG | ORAL_CAPSULE | ORAL | Status: DC
Start: 2012-04-16 — End: 2012-07-17

## 2012-04-21 MED ORDER — POTASSIUM CHLORIDE CRYS ER 10 MEQ PO TBCR
10 | ORAL_TABLET | ORAL | 1.00 refills | 30.00 days | Status: DC
Start: 2012-04-21 — End: 2012-04-21

## 2012-04-21 MED ORDER — POTASSIUM CHLORIDE CRYS ER 10 MEQ PO TBCR
10 | ORAL_TABLET | ORAL | 1.00 refills | 30.00 days | Status: DC
Start: 2012-04-21 — End: 2013-05-06

## 2012-05-19 MED ORDER — GABAPENTIN 300 MG PO CAPS
300 | ORAL_CAPSULE | ORAL | 1.00 refills | 30.00 days | Status: DC
Start: 2012-05-19 — End: 2013-05-06

## 2012-06-12 MED ORDER — LEVOTHYROXINE SODIUM 75 MCG PO TABS
75 MCG | ORAL_TABLET | ORAL | Status: DC
Start: 2012-06-12 — End: 2012-09-14

## 2012-06-12 MED ORDER — TRIAMTERENE-HCTZ 37.5-25 MG PO CAPS
37.5-25 | ORAL_CAPSULE | ORAL | 1.00 refills | Status: DC
Start: 2012-06-12 — End: 2012-09-14

## 2012-07-16 LAB — BASIC METABOLIC PANEL
BUN: 19 mg/dL (ref 7–20)
CO2: 25 mmol/L (ref 21–32)
Calcium: 9.9 mg/dL (ref 8.3–10.6)
Chloride: 99 mmol/L (ref 99–110)
Creatinine: 1.2 mg/dL (ref 0.6–1.2)
GFR African American: 57 — AB (ref >60)
GFR Non-African American: 47 — AB (ref >60)
Glucose: 123 mg/dL — ABNORMAL HIGH (ref 70–99)
Potassium: 3.9 mmol/L (ref 3.5–5.1)
Sodium: 140 mmol/L (ref 136–145)

## 2012-07-16 LAB — CBC
Hematocrit: 39.2 % (ref 36.0–48.0)
Hemoglobin: 12.7 g/dL (ref 12.0–16.0)
MCH: 26.8 pg (ref 26.0–34.0)
MCHC: 32.5 g/dL (ref 31.0–36.0)
MCV: 82.3 fL (ref 80.0–100.0)
MPV: 9.5 fL (ref 5.0–10.5)
Platelets: 280 10*3/uL (ref 135–450)
RBC: 4.76 M/uL (ref 4.00–5.20)
RDW: 14 % (ref 12.4–15.4)
WBC: 7.1 10*3/uL (ref 4.0–11.0)

## 2012-07-16 LAB — IRON AND TIBC
Iron Saturation: 15 % (ref 15–50)
Iron: 49 ug/dL (ref 37–145)
TIBC: 331 ug/dL (ref 260–445)

## 2012-07-16 LAB — FERRITIN: Ferritin: 47 ng/mL (ref 15–150)

## 2012-07-16 LAB — PHOSPHORUS: Phosphorus: 2.9 mg/dL (ref 2.5–4.9)

## 2012-07-16 LAB — MAGNESIUM: Magnesium: 2.1 mg/dL (ref 1.80–2.40)

## 2012-07-17 LAB — VITAMIN D 25 HYDROXY: Vit D, 25-Hydroxy: 28 ng/mL — ABNORMAL LOW (ref 30–80)

## 2012-07-17 MED ORDER — OMEPRAZOLE 20 MG PO CPDR
20 MG | ORAL_CAPSULE | ORAL | Status: DC
Start: 2012-07-17 — End: 2012-10-17

## 2012-09-15 MED ORDER — TRIAMTERENE-HCTZ 37.5-25 MG PO CAPS
37.5-25 | ORAL_CAPSULE | ORAL | 1.00 refills | Status: DC
Start: 2012-09-15 — End: 2013-03-11

## 2012-09-15 MED ORDER — LEVOTHYROXINE SODIUM 75 MCG PO TABS
75 MCG | ORAL_TABLET | ORAL | Status: DC
Start: 2012-09-15 — End: 2013-03-11

## 2012-09-30 NOTE — Progress Notes (Signed)
 Subjective:      Patient ID: Jodi Walls is a 72 y.o. female.    HPI Comments: Patient presents with:    Blood Pressure Check - pt blood pressure was elevated at dr Gerhard Knuckles office. pt was told to check with pcp.  pt has been feeling lightheaded and dizzy and anxiety in chest.     She is overall well and no c/o  She does note some irregular heart beat in past. She has had similar when she saw the cardiologist  No cp no sob.     Date of Birth:  [birth year]    Date of Visit:  09/30/2012     -- Codeine -- Nausea And Vomiting    Current Outpatient Prescriptions:  triamterene -hydrochlorothiazide (DYAZIDE) 37.5-25 MG per capsule, TAKE ONE CAPSULE BY MOUTH DAILY, Disp: 90 capsule, Rfl: 1  levothyroxine  (SYNTHROID ) 75 MCG tablet, TAKE ONE TABLET BY MOUTH DAILY, Disp: 90 tablet, Rfl: 1  omeprazole  (PRILOSEC) 20 MG capsule, TAKE ONE CAPSULE BY MOUTH EVERY DAY, Disp: 90 capsule, Rfl: 0  gabapentin  (NEURONTIN ) 300 MG capsule, TAKE ONE CAPSULE BY MOUTH EVERY DAY WITH SUPPER, Disp: 30 capsule, Rfl: 4  potassium chloride  SA (K-DUR;KLOR-CON  M) 10 MEQ tablet, TAKE 1 TABLET BY MOUTH EVERY DAY, Disp: 90 tablet, Rfl: 2  traMADol  (ULTRAM ) 50 MG tablet, Take 50 mg by mouth 2 times daily as needed.    Indications: Knee Pain, Disp: , Rfl:     No current facility-administered medications for this visit.      ---------------------------------------                09/30/12      09/30/12                   1544          1618     ---------------------------------------   BP:           138/78        138/82     Pulse:                        68       Temp:   99.1 F (37.3 C)              TempSrc:       Oral                    Height:  5' 2 (1.575 m)               Weight: 216 lb (97.977 kg)            ---------------------------------------  Body mass index is 39.5 kg/(m^2).     Wt Readings from Last 3 Encounters:  09/30/12 : 216 lb (97.977 kg)  02/18/12 : 220 lb (99.791 kg)  02/14/12 : 220 lb (99.791 kg)  BP  Readings from Last 3 Encounters:  09/30/12 : 138/82  02/18/12 : 140/88  02/14/12 : 130/80        Review of Systems    Objective:   Physical Exam   Constitutional: She appears well-developed and well-nourished. No distress.   Cardiovascular: Normal rate, regular rhythm and normal heart sounds.  Exam reveals no gallop and no friction rub.    No murmur heard.       Pulmonary/Chest: Effort normal and breath sounds normal. No accessory muscle usage. Not tachypneic. No respiratory distress. She has no decreased breath sounds.  She has no wheezes. She has no rhonchi. She has no rales.   Abdominal: Soft. Normal appearance and bowel sounds are normal. She exhibits no distension, no abdominal bruit and no mass. There is no hepatosplenomegaly. There is no tenderness. There is no guarding.   Lymphadenopathy:     She has no cervical adenopathy.        Right: No supraclavicular adenopathy present.        Left: No supraclavicular adenopathy present.   Neurological: She is alert.   Skin: Skin is warm, dry and intact. She is not diaphoretic. No cyanosis. No pallor. Nails show no clubbing.   Psychiatric: She has a normal mood and affect.       Assessment:      1. Essential hypertension, benign     2. Unspecified hypothyroidism  TSH without Reflex    T4, free   3. Elevated glucose  Glucose    Hemoglobin A1c           Plan:      Do follow up with dr simth to check the heart   Check the blood today  See me in 3 months

## 2012-09-30 NOTE — Progress Notes (Signed)
Per dr Modesto Charon, appt scheduled with dr Katrinka Blazing, cardiology at [facility], her cardiologist, on Friday 10/03/12 at the [facility] office at 230 pm.  Pt aware.

## 2012-09-30 NOTE — Patient Instructions (Signed)
Do follow up with dr simth to check the heart   Check the blood today  See me in 3 months

## 2012-10-01 LAB — T4, FREE: T4 Free: 1.4 ng/ml (ref 0.9–1.8)

## 2012-10-01 LAB — HEMOGLOBIN A1C
Estimated Avg Glucose: 122.6 mg/dL
Hemoglobin A1C: 5.9 %

## 2012-10-01 LAB — GLUCOSE: Glucose: 99 mg/dL (ref 70–99)

## 2012-10-01 LAB — TSH: TSH: 1.17 u[IU]/mL (ref 0.27–4.20)

## 2012-10-03 NOTE — Telephone Encounter (Signed)
Pt's insurance [facility]([employer] H[employer]) requires a referral from our office and pt is there NOW to see Dr Katrinka BlazingSmith. Bonita QuinLinda  needs for this to be placed ASAP b/c it's her understanding that [facility] is no longer backdating referrals.

## 2012-10-03 NOTE — Telephone Encounter (Signed)
Called linda, faxed referrral to [place] today.

## 2012-10-17 MED ORDER — OMEPRAZOLE 20 MG PO CPDR
20 MG | ORAL_CAPSULE | ORAL | Status: DC
Start: 2012-10-17 — End: 2013-01-20

## 2012-11-03 LAB — CBC
Hematocrit: 36.9 % (ref 36.0–48.0)
Hemoglobin: 12.8 g/dL (ref 12.0–16.0)
MCH: 28.9 pg (ref 26.0–34.0)
MCHC: 34.7 g/dL (ref 31.0–36.0)
MCV: 83.4 fL (ref 80.0–100.0)
MPV: 9.4 fL (ref 5.0–10.5)
Platelets: 257 10*3/uL (ref 135–450)
RBC: 4.42 M/uL (ref 4.00–5.20)
RDW: 14.1 % (ref 12.4–15.4)
WBC: 6.6 10*3/uL (ref 4.0–11.0)

## 2012-11-03 LAB — BASIC METABOLIC PANEL
BUN: 30 mg/dL — ABNORMAL HIGH (ref 7–20)
CO2: 33 mmol/L — ABNORMAL HIGH (ref 21–32)
Calcium: 10.2 mg/dL (ref 8.3–10.6)
Chloride: 89 mmol/L — ABNORMAL LOW (ref 99–110)
Creatinine: 1.8 mg/dL — ABNORMAL HIGH (ref 0.6–1.2)
GFR African American: 35 — AB (ref >60)
GFR Non-African American: 29 — AB (ref >60)
Glucose: 148 mg/dL — ABNORMAL HIGH (ref 70–99)
Potassium: 2.7 mmol/L — CL (ref 3.5–5.1)
Sodium: 135 mmol/L — ABNORMAL LOW (ref 136–145)

## 2012-11-03 LAB — CREATININE, RANDOM URINE: Creatinine, Ur: 22.2 mg/dL — ABNORMAL LOW (ref 28.0–259.0)

## 2012-11-03 LAB — PHOSPHORUS: Phosphorus: 3.2 mg/dL (ref 2.5–4.9)

## 2012-11-03 LAB — PROTEIN, URINE, RANDOM: Protein, Ur: 4 mg/dL (ref <12)

## 2012-11-03 LAB — MAGNESIUM: Magnesium: 2.4 mg/dL (ref 1.80–2.40)

## 2012-12-17 MED ORDER — ALPRAZOLAM 0.25 MG PO TABS
0.25 MG | ORAL_TABLET | Freq: Two times a day (BID) | ORAL | Status: DC | PRN
Start: 2012-12-17 — End: 2013-11-03

## 2012-12-17 NOTE — Telephone Encounter (Signed)
Pt is requesting for a new rx for Xanax for anxiety.  Pt stated that when she was last in the office back in June she was supposed to get a rx but she never got one.  Pl advise.

## 2012-12-17 NOTE — Telephone Encounter (Signed)
ok 

## 2012-12-17 NOTE — Telephone Encounter (Signed)
Left rx up front for pick up, pt advised.

## 2012-12-31 NOTE — Progress Notes (Signed)
Sodium (mmol/L)   Date Value   11/03/2012 135*    BUN (mg/dL)   Date Value   05/05/1094 30*    Glucose (mg/dL)   Date Value   0/07/5407 148*   08/15/2009 92       Potassium (mmol/L)   Date Value   11/03/2012 2.7*    CREATININE (mg/dL)   Date Value   11/28/1912 1.8*           BP Readings from Last 2 Encounters:   09/30/12 138/82   02/18/12 140/88       Is patient currently taking any antihypertensive medications?     Yes   If yes, see med list as above    Is the patient reporting any side effects of antihypertensive medications?   No    Is the patient taking any over the counter medications?    Yes   If yes, see med list as above    Is the patient taking a daily aspirin?    Yes

## 2012-12-31 NOTE — Patient Instructions (Addendum)
Self- Management Goals for the Patient with Hypertension:    It is important to have goals to work towards when you have Hypertension.    Below is a list of goals your doctor would like you to work towards to help control your hypertension and also maintain and improve your overall health.    Please select one of these goals to try before your next follow up visit:    Goal: i will 5 lbs in the next 6 months.     Guess your barriers before they happen. Everyone runs into barriers to their goals. You may already know what's going to get in your way. Write down these problems (cost? time? stress? fear?), and think of ways to get around them.     Barriers to success: none  Plan for overcoming my barriers: N/A     Confidence: 10/10  Date goal set: 12/31/2012  Date goal attained:    See dr Marlyne Beards for the area of tenderness and the lump on the lower right chest wall   See in 5 months          DASH Diet: After Your Visit  Your Care Instructions  The DASH diet is an eating plan that can help lower your blood pressure. DASH stands for Dietary Approaches to Stop Hypertension. Hypertension is high blood pressure.  The DASH diet focuses on eating foods that are high in calcium, potassium, and magnesium. These nutrients can lower blood pressure. The foods that are highest in these nutrients are fruits, vegetables, low-fat dairy products, nuts, seeds, and legumes. But taking calcium, potassium, and magnesium supplements instead of eating foods that are high in those nutrients does not have the same effect. The DASH diet also includes whole grains, fish, and poultry.  The DASH diet is one of several lifestyle changes your doctor may recommend to lower your high blood pressure. Your doctor may also want you to decrease the amount of sodium in your diet. Lowering sodium while following the DASH diet can lower blood pressure even further than just the DASH diet alone.  Follow-up care is a key part of your treatment and safety. Be  sure to make and go to all appointments, and call your doctor if you are having problems. It's also a good idea to know your test results and keep a list of the medicines you take.  How can you care for yourself at home?  Following the DASH diet  ?? Eat 4 to 5 servings of fruit each day. A serving is 1 medium-sized piece of fruit, ?? cup chopped or canned fruit, 1/4 cup dried fruit, or 4 ounces (?? cup) of fruit juice. Choose fruit more often than fruit juice.  ?? Eat 4 to 5 servings of vegetables each day. A serving is 1 cup of lettuce or raw leafy vegetables, ?? cup of chopped or cooked vegetables, or 4 ounces (?? cup) of vegetable juice. Choose vegetables more often than vegetable juice.  ?? Get 2 to 3 servings of low-fat and fat-free dairy each day. A serving is 8 ounces of milk, 1 cup of yogurt, or 1 ?? ounces of cheese.  ?? Eat 6 to 8 servings of grains each day. A serving is 1 slice of bread, 1 ounce of dry cereal, or ?? cup of cooked rice, pasta, or cooked cereal. Try to choose whole-grain products as much as possible.  ?? Limit lean meat, poultry, and fish to 2 servings each day. A serving is 3 ounces,  about the size of a deck of cards.  ?? Eat 4 to 5 servings of nuts, seeds, and legumes (cooked dried beans, lentils, and split peas) each week. A serving is 1/3 cup of nuts, 2 tablespoons of seeds, or ?? cup cooked dried beans or peas.  ?? Limit fats and oils to 2 to 3 servings each day. A serving is 1 teaspoon of vegetable oil or 2 tablespoons of salad dressing.  ?? Limit sweets and added sugars to 5 servings or less a week. A serving is 1 tablespoon jelly or jam, ?? cup sorbet, or 1 cup of lemonade.  ?? Eat less than 2,300 milligrams (mg) of sodium a day. If you have high blood pressure, diabetes, or chronic kidney disease, if you are African-American, or if you are older than age 58, try to limit the amount of sodium you eat to less than 1,500 mg a day.  Tips for success  ?? Start small. Do not try to make dramatic changes  to your diet all at once. You might feel that you are missing out on your favorite foods and then be more likely to not follow the plan. Make small changes, and stick with them. Once those changes become habit, add a few more changes.  ?? Try some of the following:  ?? Make it a goal to eat a fruit or vegetable at every meal and at snacks. This will make it easy to get the recommended amount of fruits and vegetables each day.  ?? Try yogurt topped with fruit and nuts for a snack or healthy dessert.  ?? Add lettuce, tomato, cucumber, and onion to sandwiches.  ?? Combine a ready-made pizza crust with low-fat mozzarella cheese and lots of vegetable toppings. Try using tomatoes, squash, spinach, broccoli, carrots, cauliflower, and onions.  ?? Have a variety of cut-up vegetables with a low-fat dip as an appetizer instead of chips and dip.  ?? Sprinkle sunflower seeds or chopped almonds over salads. Or try adding chopped walnuts or almonds to cooked vegetables.  ?? Try some vegetarian meals using beans and peas. Add garbanzo or kidney beans to salads. Make burritos and tacos with mashed pinto beans or black beans.   Where can you learn more?   Go to [email or web address] and sign in to your MyChart account. Enter [id number] in the Search Health Information box to learn more about ???DASH Diet: After Your Visit.???    If you do not have an account, please click on the ???Sign Up Now??? link.     ?? 2006-2014 [employer]. Care instructions adapted under license by [facility]. This care instruction is for use with your licensed healthcare professional. If you have questions about a medical condition or this instruction, always ask your healthcare professional. Healthwise, Inc[employer] disclaims any warranty or liability for your use of this information.  Content Version: 10.1.311062; Current as of: July 09, 2012              Learning About High Blood Pressure  What is high blood pressure?  Blood  pressure is a measure of how hard the blood pushes against the walls of your arteries. It's normal for blood pressure to go up and down throughout the day, but if it stays up, you have high blood pressure. Another name for high blood pressure is hypertension.  Two numbers tell you your blood pressure. The first number is the systolic pressure. It shows how hard the blood pushes when your heart is pumping. The  second number is the diastolic pressure. It shows how hard the blood pushes between heartbeats, when your heart is relaxed and filling with blood.  Adults should have a blood pressure of less than 120/80 (say "120 over 80"). High blood pressure is 140/90 or higher. Many people fall into the category in between, called prehypertension. People with prehypertension need to make lifestyle changes to bring their blood pressure down and help prevent or delay high blood pressure.  What happens when you have high blood pressure?  ?? Blood flows through your arteries with too much force. Over time, this damages the walls of your arteries. But you can't feel it. High blood pressure usually doesn't cause symptoms.  ?? Fat and calcium start to build up in your arteries. This buildup is called plaque. Plaque makes your arteries narrower and stiffer. Blood can't flow through them as easily.  ?? This lack of good blood flow starts to damage some of the organs in your body. This can lead to problems such as coronary artery disease and heart attack, heart failure, stroke, kidney failure, and eye damage.  How can you prevent high blood pressure?  ?? Stay at a healthy weight.  ?? Try to limit how much sodium you eat to less than 2,300 milligrams (mg) a day. And try to limit the sodium you eat to less than 1,500 mg a day if you are 51 or older, are black, or have high blood pressure, diabetes, or chronic kidney disease.  ?? Buy foods that are labeled "unsalted," "sodium-free," or "low-sodium." Foods labeled "reduced-sodium" and "light  sodium" may still have too much sodium.  ?? Flavor your food with garlic, lemon juice, onion, vinegar, herbs, and spices instead of salt. Do not use soy sauce, steak sauce, onion salt, garlic salt, mustard, or ketchup on your food.  ?? Use less salt (or none) when recipes call for it. You can often use half the salt a recipe calls for without losing flavor.  ?? Be physically active. Get at least 30 minutes of exercise on most days of the week. Walking is a good choice. You also may want to do other activities, such as running, swimming, cycling, or playing tennis or team sports.  ?? Limit alcohol to 2 drinks a day for men and 1 drink a day for women.  ?? Eat plenty of fruits, vegetables, and low-fat dairy products. Eat less saturated and total fats.  How is high blood pressure treated?  ?? Your doctor will suggest making lifestyle changes. For example, your doctor may ask you to eat healthy foods, quit smoking, lose extra weight, and be more active.  ?? If lifestyle changes don't help enough or your blood pressure is very high, you will have to take medicine every day.  Follow-up care is a key part of your treatment and safety. Be sure to make and go to all appointments, and call your doctor if you are having problems. It's also a good idea to know your test results and keep a list of the medicines you take.   Where can you learn more?   Go to [email or web address] and sign in to your MyChart account. Enter P501 in the Search Health Information box to learn more about ???Learning About High Blood Pressure.???    If you do not have an account, please click on the ???Sign Up Now??? link.     ?? 2006-2014 [employer]. Care instructions adapted under license by [facility]. This care instruction is for  use with your licensed healthcare professional. If you have questions about a medical condition or this instruction, always ask your healthcare professional. [employer] disclaims  any warranty or liability for your use of this information.  Content Version: 10.0.273164; Last Revised: December 04, 2011

## 2012-12-31 NOTE — Progress Notes (Signed)
 Subjective:      Patient ID: Jodi Walls is a 72 y.o. female.    HPI Comments: Patient presents with:  Check-Up: htn, pt fasting    Overall well and seeing the renal doctor. She is on the meds. She notes ruq pain at times for a year. She notes it mainly when leaning over. Like a cramp. No affect with food. No blood in stool no melena.  No n no v. Urine is passing well no sx. About every day. She points to the right anterior rib cage  She never got the colon check  She is due to get more blood from the renal fellow.    Date of Birth:  Jun 28, 1940    Date of Visit:  12/31/2012     -- Codeine -- Nausea And Vomiting    Current Outpatient Prescriptions:  aspirin  81 MG tablet, Take 81 mg by mouth daily., Disp: , Rfl:   vitamin D (CHOLECALCIFEROL) 1000 UNIT TABS tablet, Take 1,000 Units by mouth daily., Disp: , Rfl:   ALPRAZolam  (XANAX ) 0.25 MG tablet, Take 1 tablet by mouth 2 times daily as needed for Anxiety., Disp: 30 tablet, Rfl: 1  omeprazole  (PRILOSEC) 20 MG capsule, TAKE 1 CAPSULE BY MOUTH EVERY DAY, Disp: 90 capsule, Rfl: 0  triamterene -hydrochlorothiazide (DYAZIDE) 37.5-25 MG per capsule, TAKE ONE CAPSULE BY MOUTH DAILY, Disp: 90 capsule, Rfl: 1  levothyroxine  (SYNTHROID ) 75 MCG tablet, TAKE ONE TABLET BY MOUTH DAILY, Disp: 90 tablet, Rfl: 1  gabapentin  (NEURONTIN ) 300 MG capsule, TAKE ONE CAPSULE BY MOUTH EVERY DAY WITH SUPPER, Disp: 30 capsule, Rfl: 4  potassium chloride  SA (K-DUR;KLOR-CON  M) 10 MEQ tablet, TAKE 1 TABLET BY MOUTH EVERY DAY, Disp: 90 tablet, Rfl: 2    No current facility-administered medications for this visit.      ----------------------------                12/31/12                       0955         ----------------------------   BP:           128/78         Pulse:          72           Temp:    98 F (36.7 C)     TempSrc:       Oral          Height: 5' 0.75 (1.543 m)   Weight: 213 lb (96.616 kg)  ----------------------------  Body mass index is 40.58 kg/(m^2).       Wt Readings from Last 3 Encounters:  12/31/12 : 213 lb (96.616 kg)  09/30/12 : 216 lb (97.977 kg)  02/18/12 : 220 lb (99.791 kg)  BP Readings from Last 3 Encounters:  12/31/12 : 128/78  09/30/12 : 138/82  02/18/12 : 140/88        Review of Systems   Constitutional: Negative for fever, chills, appetite change and unexpected weight change.   Respiratory: Negative for cough, chest tightness and shortness of breath.    Cardiovascular: Negative for chest pain, palpitations and leg swelling.   Gastrointestinal: Negative for nausea, vomiting, abdominal pain, diarrhea, constipation, blood in stool and abdominal distention.   Endocrine: Negative for polydipsia and polyuria.   Genitourinary: Negative for dysuria, frequency, hematuria, flank pain and difficulty urinating.   Neurological: Negative for dizziness, light-headedness and headaches.  Objective:   Physical Exam   Constitutional: She appears well-developed and well-nourished. No distress.   Cardiovascular: Normal rate, regular rhythm and normal heart sounds.  Exam reveals no gallop and no friction rub.    No murmur heard.       Pulmonary/Chest: Effort normal and breath sounds normal. No accessory muscle usage. No tachypnea. No respiratory distress. She has no decreased breath sounds. She has no wheezes. She has no rhonchi. She has no rales.   Abdominal: Soft. Normal appearance and bowel sounds are normal. She exhibits no distension, no abdominal bruit, no pulsatile midline mass and no mass. There is no hepatosplenomegaly. There is no tenderness. There is no rigidity, no guarding and no CVA tenderness.       She is tender at the area noted and she has a tender movable lump about 1X2 inch. No skin change.    Lymphadenopathy:     She has no cervical adenopathy.        Right: No supraclavicular adenopathy present.        Left: No supraclavicular adenopathy present.   Neurological: She is alert.   Skin: Skin is warm, dry and intact. No rash noted. She is not  diaphoretic. No cyanosis. No pallor. Nails show no clubbing.   Psychiatric: She has a normal mood and affect.       Assessment:      1. Essential hypertension, benign    2. Renal insufficiency    3. Other and unspecified hyperlipidemia    4. Unspecified hypothyroidism    5. Right-sided chest wall pain      She has not wanted to treat the lipids  She will follow the blood work with dr Gerhard Knuckles this week and she has the paper work at home.        Plan:      See dr Esther Hem for the area of tenderness and the lump on the lower right chest wall   See in 4 months    Jodi Walls received counseling on the following healthy behaviors: nutrition and medication adherence    Patient given educational materials on Nutrition and Hypertension    I have instructed Jodi Walls to complete a self tracking handout on Blood Pressures  and instructed them to bring it with them to her next appointment.     Discussed use, benefit, and side effects of prescribed medications.  Barriers to medication compliance addressed.  All patient questions answered.  Pt voiced understanding.

## 2013-01-01 NOTE — Progress Notes (Signed)
Subjective:      Patient ID: Jodi Walls is a 72 y.o. female.    HPI  Patient referred by Dr. Modesto Walls for evaluation of a mass on her right side. Patient reports symptoms of increased size over the past few years and now tenderness at times. Location of symptoms is the right flank over the lower edge of the ribs. Symptoms were first noted years ago. Previous evaluation includes PCP exam and referral. Patient has a history of right mastectomy but this does not appear to be near the surgical area. Will plan following treatment: removal of lipoma in OR under sedation.    Review of Systems   Musculoskeletal: Positive for arthralgias.   Skin:        Painful mass right side   All other systems reviewed and are negative.        Objective:   Physical Exam   Constitutional: She is oriented to person, place, and time. She appears well-developed and well-nourished.   HENT:   Head: Normocephalic and atraumatic.   Neck: Normal range of motion. Neck supple. No tracheal deviation present. No thyromegaly present.   Cardiovascular: Normal rate and normal heart sounds.    No murmur heard.  Pulmonary/Chest: Effort normal and breath sounds normal.   Abdominal: Soft. She exhibits no distension. There is no tenderness. There is no guarding.   Musculoskeletal: Normal range of motion. She exhibits no edema.   Neurological: She is alert and oriented to person, place, and time.   Skin: Skin is warm and dry.        Psychiatric: She has a normal mood and affect. Thought content normal.       Assessment:      1. Lipoma of flank             Plan:      Removal of lipoma in OR    The risks, benefits and alternatives to the planned procedure were discussed. Patient expressed an understanding and is willing to proceed.

## 2013-01-02 LAB — CBC
Hematocrit: 38.6 % (ref 36.0–48.0)
Hemoglobin: 13 g/dL (ref 12.0–16.0)
MCH: 28.7 pg (ref 26.0–34.0)
MCHC: 33.7 g/dL (ref 31.0–36.0)
MCV: 85 fL (ref 80.0–100.0)
MPV: 10.1 fL (ref 5.0–10.5)
Platelets: 280 10*3/uL (ref 135–450)
RBC: 4.54 M/uL (ref 4.00–5.20)
RDW: 14 % (ref 12.4–15.4)
WBC: 6.4 10*3/uL (ref 4.0–11.0)

## 2013-01-02 LAB — BASIC METABOLIC PANEL
BUN: 25 mg/dL — ABNORMAL HIGH (ref 7–20)
CO2: 27 mmol/L (ref 21–32)
Calcium: 10.1 mg/dL (ref 8.3–10.6)
Chloride: 95 mmol/L — ABNORMAL LOW (ref 99–110)
Creatinine: 1.5 mg/dL — ABNORMAL HIGH (ref 0.6–1.2)
GFR African American: 44 — AB (ref >60)
GFR Non-African American: 36 — AB (ref >60)
Glucose: 110 mg/dL — ABNORMAL HIGH (ref 70–99)
Potassium: 3.2 mmol/L — ABNORMAL LOW (ref 3.5–5.1)
Sodium: 140 mmol/L (ref 136–145)

## 2013-01-02 LAB — PHOSPHORUS: Phosphorus: 3 mg/dL (ref 2.5–4.9)

## 2013-01-02 LAB — MAGNESIUM: Magnesium: 2.3 mg/dL (ref 1.80–2.40)

## 2013-01-02 LAB — CREATININE, RANDOM URINE: Creatinine, Ur: 131.6 mg/dL (ref 28.0–259.0)

## 2013-01-02 LAB — PROTEIN, URINE, RANDOM: Protein, Ur: 19 mg/dL — ABNORMAL HIGH (ref <12)

## 2013-01-05 NOTE — Telephone Encounter (Signed)
Tried to reach patient regarding surgery.  No Answer.  1st attempt.

## 2013-01-08 NOTE — Telephone Encounter (Signed)
Spoke with patient regarding surgery (Removal of Right Upper Quadrant Lipoma) scheduled for 01/23/2013 @ [facility] with Dr. Marlyne Beards.  Patient is asked to arrive by 6:00 AM.  NPO after midnight.  Patient will need someone to drive her home following this procedure.  Patient is asked to bring a Photo ID & Insurance Card with her, and to report in at the Registration Desk inside the front door.  Dr. Marlyne Beards will perform H & P update.  If the hospital requires any further information, they will contact her.  Patient expressed an understanding of these instructions.  Thanked patient for taking time to speak with me.  Call ended.

## 2013-01-20 MED ORDER — OMEPRAZOLE 20 MG PO CPDR
20 MG | ORAL_CAPSULE | ORAL | Status: DC
Start: 2013-01-20 — End: 2013-08-18

## 2013-01-23 LAB — EKG 12-LEAD
Atrial Rate: 58 {beats}/min
P Axis: 65 degrees
P-R Interval: 164 ms
Q-T Interval: 494 ms
QRS Duration: 154 ms
QTc Calculation (Bazett): 484 ms
R Axis: 6 degrees
T Axis: 72 degrees
Ventricular Rate: 58 {beats}/min

## 2013-01-23 MED ORDER — FENTANYL CITRATE 0.05 MG/ML IJ SOLN
0.05 | INTRAMUSCULAR | Status: DC | PRN
Start: 2013-01-23 — End: 2013-01-24

## 2013-01-23 MED ORDER — DIPHENHYDRAMINE HCL 50 MG/ML IJ SOLN
50 | Freq: Once | INTRAMUSCULAR | Status: AC | PRN
Start: 2013-01-23 — End: 2013-01-23

## 2013-01-23 MED ORDER — ONDANSETRON HCL 4 MG/2ML IJ SOLN
4 | Freq: Once | INTRAMUSCULAR | Status: AC | PRN
Start: 2013-01-23 — End: 2013-01-23

## 2013-01-23 MED ORDER — OXYCODONE HCL 5 MG PO TABS
5 MG | ORAL_TABLET | ORAL | Status: AC | PRN
Start: 2013-01-23 — End: 2013-01-30

## 2013-01-23 MED ADMIN — famotidine (PEPCID) injection 20 mg: 20 mg | INTRAVENOUS | @ 11:00:00 | NDC 63323073912

## 2013-01-23 MED ADMIN — ondansetron (ZOFRAN) injection 4 mg: INTRAVENOUS | @ 11:00:00 | NDC 23155037831

## 2013-01-23 MED ADMIN — 0.9 % sodium chloride infusion: INTRAVENOUS | @ 11:00:00 | NDC 00338004904

## 2013-01-23 MED ADMIN — ceFAZolin (ANCEF) 2 g in dextrose 5% 100 mL IVPB: INTRAVENOUS | @ 11:00:00 | NDC 09999990046

## 2013-01-23 MED FILL — FENTANYL CITRATE 0.05 MG/ML IJ SOLN: 0.05 MG/ML | INTRAMUSCULAR | Qty: 2

## 2013-01-23 MED FILL — ONDANSETRON HCL 4 MG/2ML IJ SOLN: 4 MG/2ML | INTRAMUSCULAR | Qty: 2

## 2013-01-23 MED FILL — BUPIVACAINE HCL (PF) 0.5 % IJ SOLN: 0.5 % | INTRAMUSCULAR | Qty: 30

## 2013-01-23 MED FILL — MIDAZOLAM HCL 2 MG/2ML IJ SOLN: 2 MG/ML | INTRAMUSCULAR | Qty: 2

## 2013-01-23 MED FILL — CEFAZOLIN 2000 MG IN D5W 100 ML IVPB: Qty: 2

## 2013-01-23 MED FILL — LIDOCAINE HCL 1 % IJ SOLN: 1 % | INTRAMUSCULAR | Qty: 20

## 2013-01-23 MED FILL — FAMOTIDINE 20 MG/2ML IV SOLN: 20 MG/2ML | INTRAVENOUS | Qty: 2

## 2013-01-23 MED FILL — PROPOFOL 10 MG/ML IV EMUL: 10 MG/ML | INTRAVENOUS | Qty: 40

## 2013-01-23 MED FILL — SODIUM CHLORIDE 0.9 % IV SOLN: 0.9 % | INTRAVENOUS | Qty: 1000

## 2013-01-23 NOTE — Discharge Instructions (Signed)
Follow up in 2-3 weeks  Call [phone] for an appointment  Remove dressings in 3-4 days  Ok to shower in AM  No Driving today. OK to drive starting Saturday if you are not taking any pain medication.

## 2013-01-23 NOTE — H&P (Signed)
H&P Update    Patient's History and Physical from my office visit on January 01, 2013 was reviewed.    Patient examined.  Lipoma along right costal margin    There has been no change to overall health or lipoma.    Plan removal of lipoma    The risks, benefits and alternatives to the planned procedure were discussed. Patient expressed an understanding and is willing to proceed.    Electronically signed by Lavona Mound, MD on 01/23/2013 at 7:15 AM      Devi Hopman Marcine Matar

## 2013-01-23 NOTE — Anesthesia Pre-Procedure Evaluation (Signed)
Jodi Walls     Anesthesia Evaluation     Patient summary reviewed    History of anesthetic complications (PO N/V)   Airway   Mallampati: II  TM distance: >3 FB  Neck ROM: full  Dental    (+) upper dentures and lower dentures    Pulmonary    (+) shortness of breath, sleep apnea (snoring),   (-) pneumonia, COPD, asthma, recent URI, rhonchi, decreased breath sounds, wheezes  Cardiovascular   Exercise tolerance: poor  (+) hypertension well controlled, DOE,   (-) pacemaker, valvular problems/murmurs, past MI, CAD, CABG/stent, dysrhythmias, angina, CHF, orthopnea, PND, murmur    NYHA Classification: II  Rhythm: regular  Rate: normal  Beta Blocker:  Not on Beta Blocker    Neuro/Psych    (-) seizures, neuromuscular disease, TIA, CVA, headaches, psychiatric history  GI/Hepatic/Renal    (+) GERD well controlled, chronic renal disease CRI,   (-) hiatal hernia, PUD, hepatitis, liver disease, bowel prep    Endo/Other  (+) , hypothyroidism, blood dyscrasia (bruise easy), arthritis  (-) no type I diabetes, hyperthyroidism  Abdominal   (+) obese,   Abdomen: soft.  Bowel sounds: normal.       Other findings: 5'1"  213 Lbs  BMI 40.3             Allergies: Codeine    NPO Status: Time of last liquid consumption: 2100                       Time of last solid food consumption: 2100    [facility] Department of Anesthesiology  Pre-Anesthesia Evaluation/Consultation       Name:  [name]                                         Age:  72 y.o.  MRN:  [id number]           Procedure (Scheduled):  Removal of Rt upper quadrant lipoma  Surgeon:  Dr. Marlyne Beards     Allergies   Allergen Reactions   ??? Codeine Nausea And Vomiting     Patient Active Problem List   Diagnosis   ??? Other and unspecified hyperlipidemia   ??? Unspecified hypothyroidism   ??? Essential hypertension, benign   ??? HX: breast cancer   ??? Renal insufficiency     Past Medical History   Diagnosis Date   ??? Hypothyroidism    ??? Cancer (HCC)    ??? Kidney failure (HCC)      stage 3          Past Surgical History   Procedure Laterality Date   ??? Breast surgery     ??? Appendectomy     ??? Hysterectomy     ??? Cholecystectomy     ??? Back surgery     ??? Neck surgery       History   Substance Use Topics   ??? Smoking status: Never Smoker    ??? Smokeless tobacco: Never Used   ??? Alcohol Use: No     Medications  Current Outpatient Prescriptions on File Prior to Encounter   Medication Sig Dispense Refill   ??? omeprazole (PRILOSEC) 20 MG capsule TAKE 1 CAPSULE BY MOUTH EVERY DAY  90 capsule  1   ??? vitamin D (CHOLECALCIFEROL) 1000 UNIT TABS tablet Take 1,000 Units by mouth daily.       ???  ALPRAZolam (XANAX) 0.25 MG tablet Take 1 tablet by mouth 2 times daily as needed for Anxiety.  30 tablet  1   ??? triamterene-hydrochlorothiazide (DYAZIDE) 37.5-25 MG per capsule TAKE ONE CAPSULE BY MOUTH DAILY  90 capsule  1   ??? levothyroxine (SYNTHROID) 75 MCG tablet TAKE ONE TABLET BY MOUTH DAILY  90 tablet  1   ??? gabapentin (NEURONTIN) 300 MG capsule TAKE ONE CAPSULE BY MOUTH EVERY DAY WITH SUPPER  30 capsule  4   ??? potassium chloride SA (K-DUR;KLOR-CON M) 10 MEQ tablet TAKE 1 TABLET BY MOUTH EVERY DAY  90 tablet  2   ??? aspirin 81 MG tablet Take 81 mg by mouth daily.         No current facility-administered medications on file prior to encounter.     Current Outpatient Prescriptions   Medication Sig Dispense Refill   ??? omeprazole (PRILOSEC) 20 MG capsule TAKE 1 CAPSULE BY MOUTH EVERY DAY  90 capsule  1   ??? vitamin D (CHOLECALCIFEROL) 1000 UNIT TABS tablet Take 1,000 Units by mouth daily.       ??? ALPRAZolam (XANAX) 0.25 MG tablet Take 1 tablet by mouth 2 times daily as needed for Anxiety.  30 tablet  1   ??? triamterene-hydrochlorothiazide (DYAZIDE) 37.5-25 MG per capsule TAKE ONE CAPSULE BY MOUTH DAILY  90 capsule  1   ??? levothyroxine (SYNTHROID) 75 MCG tablet TAKE ONE TABLET BY MOUTH DAILY  90 tablet  1   ??? gabapentin (NEURONTIN) 300 MG capsule TAKE ONE CAPSULE BY MOUTH EVERY DAY WITH SUPPER  30 capsule  4   ??? potassium chloride SA  (K-DUR;KLOR-CON M) 10 MEQ tablet TAKE 1 TABLET BY MOUTH EVERY DAY  90 tablet  2   ??? aspirin 81 MG tablet Take 81 mg by mouth daily.         Current Facility-Administered Medications   Medication Dose Route Frequency Provider Last Rate Last Dose   ??? 0.9 % sodium chloride infusion   Intravenous Continuous Alden Hipp, MD 75 mL/hr at 01/23/13 0643     ??? sodium chloride flush 0.9 % injection 10 mL  10 mL Intravenous Q12H [facility] Alden Hipp, MD       ??? sodium chloride flush 0.9 % injection 10 mL  10 mL Intravenous PRN Alden Hipp, MD       ??? famotidine (PEPCID) injection 20 mg  20 mg Intravenous Once Alden Hipp, MD       ??? ceFAZolin (ANCEF) 2 g in dextrose 5% 100 mL IVPB  2 g Intravenous Once Lavona Mound, MD         Vital Signs (Current)   Filed Vitals:    01/23/13 0606   BP: 128/69   Pulse: 71   Temp: 97.6 ??F (36.4 ??C)   Resp: 18     Vital Signs Statistics (for past 48 hrs)     BP  Min: 128/69   Min taken time: 01/23/13 0606  Max: 128/69   Max taken time: 01/23/13 0606  Temp  Avg: 97.6 ??F (36.4 ??C)  Min: 97.6 ??F (36.4 ??C)   Min taken time: 01/23/13 0606  Max: 97.6 ??F (36.4 ??C)   Max taken time: 01/23/13 0606  Pulse  Avg: 71  Min: 71   Min taken time: 01/23/13 0606  Max: 71   Max taken time: 01/23/13 0606  Resp  Avg: 18  Min: 18   Min taken time: 01/23/13 0606  Max:  18   Max taken time: 01/23/13 0606  SpO2  Avg: 98 %  Min: 98 %   Min taken time: 01/23/13 0606  Max: 98 %   Max taken time: 01/23/13 0606    BP Readings from Last 3 Encounters:   01/23/13 128/69   01/01/13 130/70   12/31/12 128/78     BMI  Body mass index is 40.27 kg/(m^2).  Estimated body mass index is 40.27 kg/(m^2) as calculated from the following:    Height as of this encounter: 5\' 1"  (1.549 m).    Weight as of this encounter: 213 lb (96.616 kg).    CBC   Lab Results   Component Value Date    WBC 6.4 01/01/2013    RBC 4.54 01/01/2013    HGB 13.0 01/01/2013    HCT 38.6 01/01/2013    MCV 85.0 01/01/2013    RDW 14.0 01/01/2013     PLT 280 01/01/2013     CMP    Lab Results   Component Value Date    NA 140 01/01/2013    K 3.2 01/01/2013    CL 95 01/01/2013    CO2 27 01/01/2013    BUN 25 01/01/2013    CREATININE 1.5 01/01/2013    GFRAA 44 01/01/2013    GFRAA 48 10/11/2011    AGRATIO 1.5 08/23/2011    LABGLOM 36 01/01/2013    LABGLOM 47.2 08/15/2009    GLUCOSE 110 01/01/2013    GLUCOSE 92 08/15/2009    PROT 6.8 12/20/2011    PROT 7.1 02/22/2011    CALCIUM 10.1 01/01/2013    BILITOT 0.50 08/23/2011    ALKPHOS 108 08/23/2011    AST 32 08/23/2011    ALT 26 08/23/2011     BMP    Lab Results   Component Value Date    NA 140 01/01/2013    K 3.2 01/01/2013    CL 95 01/01/2013    CO2 27 01/01/2013    BUN 25 01/01/2013    CREATININE 1.5 01/01/2013    CALCIUM 10.1 01/01/2013    GFRAA 44 01/01/2013    GFRAA 48 10/11/2011    LABGLOM 36 01/01/2013    LABGLOM 47.2 08/15/2009    GLUCOSE 110 01/01/2013    GLUCOSE 92 08/15/2009     POCGlucose  No results found for this basename: GLUCOSE,  in the last 72 hours   Coags    No results found for this basename: PROTIME, INR, APTT     HCG (If Applicable)   No results found for this basename: PREGTESTUR, PREGSERUM, HCG, HCGQUANT      ABGs   No results found for this basename: PHART, PO2ART, PCO2ART, HCO3ART, BEART, O2SATART      Type & Screen (If Applicable)  No results found for this basename: LABABO, LABRH       DOS STAFF ADDENDUM:    Pt seen and examined, chart reviewed (including anesthesia, drug and allergy history).  No interval changes to history and physical examination.  Anesthetic plan, risks, benefits, alternatives, and personnel involved discussed with patient.  Patient verbalized an understanding and agrees to proceed.      Zannie Kehr, MD  January 23, 2013  6:54 AM    Anesthesia Plan    ASA 3     MAC   (Morbid obesity  )  intravenous induction   Anesthetic plan and risks discussed with patient.    Plan discussed with CRNA.  Zannie Kehr, MD  01/23/2013

## 2013-01-23 NOTE — Progress Notes (Signed)
Pt wakes easily.  Oral airway removed.  Denies pain.  Quickly back to sleep.  VSS.

## 2013-01-23 NOTE — Progress Notes (Signed)
Pt awake.  Still drowsy.  OK with Dr. Kerry HoughWoo to transfer to ACU.  VSS.

## 2013-01-23 NOTE — Anesthesia Post-Procedure Evaluation (Signed)
[facility] Department of Anesthesiology  Post-Anesthesia Note       Name:  Jodi Walls                                         Age:  72 y.o.  MRN:  [id number]     Last Vitals & Oxygen Saturation: BP 102/53   Pulse 57   Temp(Src) 98.4 ??F (36.9 ??C) (Temporal)   Resp 9   Ht 5\' 1"  (1.549 m)   Wt 213 lb (96.616 kg)   BMI 40.27 kg/m2   SpO2 99%  Patient Vitals for the past 4 hrs:   BP Temp Temp src Pulse Resp SpO2 Height Weight   01/23/13 0826 102/53 mmHg - - 57 9 99 % - -   01/23/13 0816 97/52 mmHg - - 59 9 97 % - -   01/23/13 0811 93/49 mmHg - - 61 10 96 % - -   01/23/13 0806 95/62 mmHg - - 63 10 96 % - -   01/23/13 0801 95/61 mmHg - - 64 10 95 % - -   01/23/13 0759 92/55 mmHg 98.4 ??F (36.9 ??C) Temporal 68 10 96 % - -   01/23/13 0606 128/69 mmHg 97.6 ??F (36.4 ??C) Temporal 71 18 98 % 5\' 1"  (1.549 m) 213 lb (96.616 kg)       Level of consciousness: awake, alert  and oriented    Respiratory: stable     Cardiovascular: stable     Hydration: stable     PONV: stable     Post-op pain: Adequate analgesia    Post-op assessment: no apparent anesthetic complications, tolerated procedure well and no evidence of recall    Complications:  none    Krisandra Bueno Wilhelmina Mcardle, MD  January 23, 2013   8:40 AM

## 2013-01-23 NOTE — Progress Notes (Signed)
Dressing to right breast dry and intact No c/o

## 2013-01-23 NOTE — Plan of Care (Signed)
1.  Patient is identified using name and date of birth.  2.  The patient is free from signs and symptoms of injury.  3.  The patient receives appropriate medication(s), safely administered during the perioperative period.  4.  The patient had wound/tissuue perfusion consistent with or improved from baseline levels established preoperatively.  5.  The patient is at or returning to normothermia at the conclusion of the immediate postoperative period.  6.  The patient's fluid, electrolyte, and acid base balances are consistent with or improved from baseline levels established preoperatively.  7.  The patient's pulmonary function is consistent with or improved from baseline levels established preoperatively.  8.  The patient's cardiovascular status is consistent with or improved from baseline levels established preoperatively.  9.  The patient/caregiver participates in decisions affecting his or her perioperative care.  10.  The patient's care is consistent with the individualized perioperative plan of care.  11.  The patient's right to privacy is maintained.  12.  The patient is the recipient of competent and ethical care within legal standards of practice.  13.  The patient's value system, lifestyle, ethnicity, and culture are considered, respected, and incorporated in the perioperative plan of care.  14.  The patient demonstrates and/or reports adequate pain control throughout the perioperative period.  15.  The patient's neurological status is consistent with or improved from baseline levels established preoperatively.  16.  The patient/caregiver demonstrates knowledge of the expected responses to the operative or invasive procedure.  17.  Patient/caregiver has reduced anxiety.  Interventions - familiarize with environment and equipment.

## 2013-01-23 NOTE — Progress Notes (Signed)
1. Verify patient.  2.Confirm procedure  3.Maintaining core body temperature 96.8 or above.  4.Maintain line, tube placement  5.Assess pain, nausea and vomiting  6.Monitor EKG, pulse ox and VS per protocol.  7.Provide instructions in incentive spirometer, C& DB  8.Side Rails up  9.Maintain Oxygen saturations above  or equal to 92 %  10.Follow standard of care

## 2013-01-23 NOTE — Discharge Instructions (Signed)
GENERAL SURGERY DISCHARGE INSTRUCTIONS    ?? Follow your surgeons instructions.  ?? Follow up with your surgeon as directed.  ?? Observe the operative area for signs of excessive bleeding.If needed apply pressure,elevate if able and contact your surgeon.  ?? Observe the operative site for any signs of infection- such as increased pain,redness,fever greater than 101 degrees,swelling, foul odor or drainage.Contact your surgeon if any of these symptoms are present.  ?? Keep operative site clean and dry.  ?? Do not remove dressing unless instructed to by surgeon.  ??   ?? If unable to urinate once you are at home,  notify your surgeon or go to the Emergency Room.  ?? Avoid pulling,pushing or tugging to suture line.  ?? If you become short of breath call your doctor or go to the ER.  ?? Take medications as directed.  ?? Pain medication should be taken with food.  ?? Do not drive or operate machinery while taking narcotics.  ?? For any problems or question call your surgeon

## 2013-01-23 NOTE — Brief Op Note (Signed)
Brief Postoperative Note    Jodi Walls  Date of Birth:  June 14, 1940  [id number]    Pre-operative Diagnosis: lipoma RUQ    Post-operative Diagnosis: Same    Procedure: removal of subcutaneous lipoma from RUQ measuring 6 cm    Anesthesia: General and MAC    Surgeons/Assistants: Lyman Bishop    Estimated Blood Loss: less than 50     Complications: None    Specimens: Was Obtained: lipoma    Findings: muti-lobulated lipoma in subcutaneous space    Electronically signed by Lavona Mound, MD on 01/23/2013 at 7:50 AM

## 2013-01-23 NOTE — Progress Notes (Signed)
To PACU from OR.  Pt asleep with oral airway in place.  Dressing to right chest dry and intact.  Right radial pulse palpable.  IV infusing.  Monitor in sinus rhythm.  VSS

## 2013-01-23 NOTE — Op Note (Signed)
PATIENT NAME:                 PA #:            MR #Jodi Walls, Jodi Walls                [id number]       [id number]            SURGEON:                              SURG DATE:  DIS DATE:          Lavona Mound, MD             01/23/2013  01/23/2013         DATE OF BIRTH:   AGE:           PATIENT TYPE:     RM #:              [birth year]       72             OSK                                     PREOPERATIVE DIAGNOSIS:  Lipoma right upper quadrant near costal margin.     POSTOPERATIVE DIAGNOSIS:  Lipoma right upper quadrant near costal margin.     PROCEDURE:  Removal of subcutaneous lipoma measuring 6-cm from the right  upper quadrant.      SPECIMEN:  Lipoma.      COMPLICATIONS:   None.      DISPOSITION:  To recovery in stable condition.      ASA CLASS:  Two.     ANTIBIOTICS:  Prophylactic Ancef.      DVT PROPHYLAXIS:  Intermittent compression devices.      INDICATIONS:  The patient is a 72 year old female who presents with an  enlarging mass in the right upper quadrant near the right costal margin.  On  exam, this appeared to be a lipoma and was tender to direct palpation.  The  risks, benefits, and alternatives of removal were reviewed and she agreed to  proceed.      PROCEDURE:  The patient was brought to the operating room, placed supine. IV  sedation delivered and the right upper quadrant and lower chest prepped and  draped in a sterile fashion.  Local anesthetic was infused and a transverse  incision made over a clearly palpable mass.  Dissection was carried into the  subcutaneous tissue where an obvious lipoma was encountered.  This extended  over a 6-cm area, had multiple lobulations.  These were dissected free of the  surrounding subcutaneous tissue and removed.  Hemostasis was achieved and the  wound was closed with 3 and 4-0 Vicryl.  Dressings were applied and the  patient transferred to recovery in good condition.                                             Mayumi Summerson Marcine Matar, MD      [id number]  DD: 01/23/2013 14:04   DT: 01/24/2013 08:23   Job #:  [id number]  CC: [name], MD  CC: Denim Start Marcine Matar, MD

## 2013-01-23 NOTE — Plan of Care (Signed)
1.Verify patient  2.Orientation surroundings  3.Confirms consent  4.Assess anxiety  5.Maintain core temperature 96.8 or above  6.Patient safety call light in reach side rail(s) up.  7. IV infusing without problems.  8.Assess increasing levels of pain.  9.Monitor for nausea.  10.Follow standard of cares.

## 2013-01-23 NOTE — Progress Notes (Signed)
To room Alert but drowsy Denies pain Up to chair Dressing to right breast dry and intact Drink given Call light in reach

## 2013-02-05 NOTE — Progress Notes (Signed)
Subjective:      Patient ID: Jodi Walls is a 72 y.o. female.    HPI  Patient presents s/p removal of a lipoma from her RUQ. Patient is two weeks post op. Pain level is minimal. Incision appearance: well healed. Post op complications: none. Pathology report reviewed with patient and showed lipoma. Follow up prn.    Review of Systems    Objective:   Physical Exam    Assessment:      1. Lipoma of flank             Plan:      Follow up with me as needed

## 2013-02-10 ENCOUNTER — Telehealth

## 2013-02-10 NOTE — Telephone Encounter (Signed)
Referral sent to [facility], and put into epic.

## 2013-02-10 NOTE — Telephone Encounter (Signed)
Pt aware this was done.

## 2013-02-10 NOTE — Telephone Encounter (Signed)
Pt has an appt w/ Dr Daphene JaegerSunil Dama.(sleep medicine doctor) tomorrow and she needs for a referral to be sent to her insurance and she needs for our office to notify Dr Salley Hewsama's office as well that this has been done. The phone # to Dr Houston Sirenama is [phone]. Pl advise pt when this has been done.

## 2013-02-16 MED ORDER — GABAPENTIN 300 MG PO CAPS
300 | ORAL_CAPSULE | ORAL | 1.00 refills | 30.00 days | Status: DC
Start: 2013-02-16 — End: 2013-05-06

## 2013-02-18 MED ORDER — GABAPENTIN 300 MG PO CAPS
300 | ORAL_CAPSULE | ORAL | 1.00 refills | 30.00 days | Status: DC
Start: 2013-02-18 — End: 2014-05-11

## 2013-03-11 MED ORDER — LEVOTHYROXINE SODIUM 75 MCG PO TABS
75 MCG | ORAL_TABLET | ORAL | Status: DC
Start: 2013-03-11 — End: 2013-06-13

## 2013-03-11 MED ORDER — TRIAMTERENE-HCTZ 37.5-25 MG PO CAPS
37.5-25 | ORAL_CAPSULE | ORAL | 1.00 refills | Status: DC
Start: 2013-03-11 — End: 2013-06-13

## 2013-05-06 LAB — LIPID PANEL
Cholesterol, Total: 277 mg/dL — ABNORMAL HIGH (ref 0–199)
HDL: 88 mg/dL — ABNORMAL HIGH (ref 40–60)
LDL Calculated: 169 mg/dL — ABNORMAL HIGH (ref <100)
Triglycerides: 102 mg/dL (ref 0–150)
VLDL Cholesterol Calculated: 20 mg/dL

## 2013-05-06 LAB — COMPREHENSIVE METABOLIC PANEL
ALT: 32 U/L (ref 10–40)
AST: 32 U/L (ref 15–37)
Albumin/Globulin Ratio: 1.6 (ref 1.1–2.2)
Albumin: 4.6 g/dL (ref 3.4–5.0)
Alkaline Phosphatase: 105 U/L (ref 40–129)
Anion Gap: 16 (ref 3–16)
BUN: 26 mg/dL — ABNORMAL HIGH (ref 7–20)
CO2: 28 mmol/L (ref 21–32)
Calcium: 10.1 mg/dL (ref 8.3–10.6)
Chloride: 94 mmol/L — ABNORMAL LOW (ref 99–110)
Creatinine: 1.5 mg/dL — ABNORMAL HIGH (ref 0.6–1.2)
GFR African American: 44 — AB (ref >60)
GFR Non-African American: 36 — AB (ref >60)
Globulin: 2.9 g/dL
Glucose: 125 mg/dL — ABNORMAL HIGH (ref 70–99)
Potassium: 3.8 mmol/L (ref 3.5–5.1)
Sodium: 138 mmol/L (ref 136–145)
Total Bilirubin: 0.6 mg/dL (ref 0.0–1.0)
Total Protein: 7.5 g/dL (ref 6.4–8.2)

## 2013-05-06 NOTE — Patient Instructions (Signed)
On the day of the surgery take the omeprazole with just enough water to get the pill down.  See [name] back in 6 months

## 2013-05-06 NOTE — Progress Notes (Signed)
 Subjective:      Patient ID: Jodi Walls is a 73 y.o. female.    HPI Comments: Patient presents with:  Check-Up: htn, pt fasting    She is doing well and for knee surgery. February the 4th. Dr. Margaretmary Walls. At [facility]  (she has still not done the colon check we have asked for)     height is 5' 0.75 (1.543 m) and weight is 212 lb (96.163 kg). Her oral temperature is 98.3 F (36.8 C). Her blood pressure is 128/78 and her pulse is 72.     Allergies:   -- Codeine -- Nausea And Vomiting      No tobacco use. No ETOH     Immunization History  Administered            Date(s) Administered      Influenza Whole       01/14/2013      Pneumococcal Polysaccharide                          08/24/2010      Current Outpatient Prescriptions:  potassium chloride  SA (K-DUR;KLOR-CON  M) 10 MEQ tablet, Take 10 mEq by mouth 3 times daily.triamterene -hydrochlorothiazide (DYAZIDE) 37.5-25 MG per capsule, TAKE ONE CAPSULE BY MOUTH DAILY  levothyroxine  (SYNTHROID ) 75 MCG tablet, TAKE 1 TABLET BY MOUTH DAILY  gabapentin  (NEURONTIN ) 300 MG capsule, TAKE 1 CAPSULE BY MOUTH EVERY DAY WITH SUPPER. Uses for leg cramps at night  omeprazole  (PRILOSEC) 20 MG capsule, TAKE 1 CAPSULE BY MOUTH EVERY DAY    aspirin  81 MG tablet, Take 81 mg by mouth daily.  vitamin D (CHOLECALCIFEROL) 1000 UNIT TABS tablet, Take 1,000 Units by mouth daily.  ALPRAZolam  (XANAX ) 0.25 MG tablet, Take 1 tablet by mouth 2 times daily as needed for Anxiety.,     Past Surgical History:    APPENDECTOMY                                                   BACK SURGERY                                                   NECK SURGERY                                                   BREAST SURGERY                                   1995            Comment:right mastectomy    CHOLECYSTECTOMY                                                HYSTERECTOMY  Comment:TAH-BSO. no  cancer    No problems with anesthesia              Review of Systems    Constitutional: Negative for fever, chills, appetite change and unexpected weight change.   HENT: Negative for sore throat, trouble swallowing and voice change.         Full dentures   Eyes: Negative for pain and visual disturbance.   Respiratory: Negative for cough, chest tightness and shortness of breath.         No hemoptysis   Cardiovascular: Negative for chest pain, palpitations and leg swelling.        No hx of heart disease. Hx of LBBB and has seen dr. Lyndell Walls and ok in 2013. 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        No dysphagia no gerd symptoms.  no ulcer hx, no hepatitis hx   Endocrine: Negative for polydipsia and polyuria.   Genitourinary: Negative for dysuria, frequency, hematuria, flank pain and difficulty urinating.   Musculoskeletal: Negative for back pain and neck pain.   Skin: Negative for rash.   Neurological: Negative for dizziness, syncope, light-headedness and headaches.        No hx of CVA   Hematological: Negative for adenopathy. Does not bruise/bleed easily.        Hx of blood clot in the hand from I.V. No anticoagulation needed. After gallbladder surgery       Objective:   Physical Exam   Constitutional: She appears well-developed and well-nourished. No distress.   HENT:   Head: Normocephalic and atraumatic.   Right Ear: Tympanic membrane and ear canal normal.   Left Ear: Tympanic membrane and ear canal normal.   Nose: Nose normal.   Mouth/Throat: Uvula is midline, oropharynx is clear and moist and mucous membranes are normal. She has dentures. No oral lesions.   Eyes: Conjunctivae are normal. No scleral icterus.   Neck: Full passive range of motion without pain. Neck supple. Carotid bruit is not present. No thyroid mass and no thyromegaly present.   Cardiovascular: Normal rate, regular rhythm and normal heart sounds.  Exam  reveals no gallop and no friction rub.    No murmur heard.       Pulmonary/Chest: Effort normal and breath sounds normal. No accessory muscle usage. No tachypnea. No respiratory distress. She has no decreased breath sounds. She has no wheezes. She has no rhonchi. She has no rales.   Abdominal: Soft. Normal appearance and bowel sounds are normal. She exhibits no distension, no abdominal bruit, no pulsatile midline mass and no mass. There is no hepatosplenomegaly. There is no tenderness. There is no guarding.   Lymphadenopathy:        Head (right side): No submental and no submandibular adenopathy present.        Head (left side): No submental and no submandibular adenopathy present.     She has no cervical adenopathy.        Right: No supraclavicular adenopathy present.        Left: No supraclavicular adenopathy present.   Neurological: She is alert.   Skin: Skin is warm, dry and intact. She is not diaphoretic. No cyanosis. No pallor. Nails show no clubbing.   Psychiatric: She has a normal mood and affect. Her speech is normal.       Assessment:  1. Preop examination  Lipid Panel    Comprehensive Metabolic Panel   2. Knee joint pain, left     3. Essential hypertension, benign  Lipid Panel    Comprehensive Metabolic Panel   4. Other and unspecified hyperlipidemia  Lipid Panel    Comprehensive Metabolic Panel   5. Renal insufficiency  Comprehensive Metabolic Panel   6. Unspecified hypothyroidism     7. HX: breast cancer         She tells me the hospital will be calling to do preop lab and ekg  She see's dr Jodi Walls for the renal care      Plan:      On the day of the surgery take the omeprazole  with just enough water to get the pill down.  See us  back in 6 months

## 2013-05-07 ENCOUNTER — Encounter

## 2013-05-20 NOTE — Telephone Encounter (Signed)
Jodi Walls w/[facility] is needing the H & P faxed to them @ [phone]

## 2013-05-21 NOTE — Telephone Encounter (Signed)
Faxed h/p, labs, and ekg to [facility].

## 2013-06-15 MED ORDER — LEVOTHYROXINE SODIUM 75 MCG PO TABS
75 MCG | ORAL_TABLET | ORAL | Status: DC
Start: 2013-06-15 — End: 2013-10-17

## 2013-06-15 MED ORDER — TRIAMTERENE-HCTZ 37.5-25 MG PO CAPS
37.5-25 | ORAL_CAPSULE | ORAL | 1.00 refills | Status: DC
Start: 2013-06-15 — End: 2013-10-18

## 2013-07-01 NOTE — Telephone Encounter (Signed)
Christine w/ Dr Linus Orn is calling to get a insurance referral to be sent in to [facility] for pt. Pt has an appt on 3/23. The diagnoses code is 585.3 (chronic kidney disease stage 3). Altha Harm wants for this to be faxed to her at [phone] when this has been done.

## 2013-07-02 NOTE — Telephone Encounter (Signed)
Submitted referral to [employer] and faxed copy to christines attn.

## 2013-07-17 LAB — BASIC METABOLIC PANEL
Anion Gap: 17 — ABNORMAL HIGH (ref 3–16)
BUN: 18 mg/dL (ref 7–20)
CO2: 24 mmol/L (ref 21–32)
Calcium: 10 mg/dL (ref 8.3–10.6)
Chloride: 98 mmol/L — ABNORMAL LOW (ref 99–110)
Creatinine: 1.4 mg/dL — ABNORMAL HIGH (ref 0.6–1.2)
GFR African American: 44 — AB (ref >60)
GFR Non-African American: 37 — AB (ref >60)
Glucose: 105 mg/dL — ABNORMAL HIGH (ref 70–99)
Potassium: 4 mmol/L (ref 3.5–5.1)
Sodium: 139 mmol/L (ref 136–145)

## 2013-07-17 LAB — VITAMIN D 25 HYDROXY: Vit D, 25-Hydroxy: 30.7 ng/mL (ref >=30)

## 2013-07-17 LAB — CBC
Hematocrit: 37.4 % (ref 36.0–48.0)
Hemoglobin: 12.2 g/dL (ref 12.0–16.0)
MCH: 27.5 pg (ref 26.0–34.0)
MCHC: 32.6 g/dL (ref 31.0–36.0)
MCV: 84.2 fL (ref 80.0–100.0)
MPV: 9.6 fL (ref 5.0–10.5)
Platelets: 253 10*3/uL (ref 135–450)
RBC: 4.45 M/uL (ref 4.00–5.20)
RDW: 14.2 % (ref 12.4–15.4)
WBC: 5.8 10*3/uL (ref 4.0–11.0)

## 2013-07-17 LAB — PROTEIN ELECTROPHORESIS, URINE
Protein, Ur: 0.024 g/dL (ref <0.012)
Protein, Ur: 24 mg/dL — ABNORMAL HIGH (ref <12)

## 2013-07-17 LAB — CREATININE, RANDOM URINE: Creatinine, Ur: 312.6 mg/dL — ABNORMAL HIGH (ref 28.0–259.0)

## 2013-07-17 LAB — PHOSPHORUS: Phosphorus: 3.5 mg/dL (ref 2.5–4.9)

## 2013-07-17 LAB — PATH INTERP ELEC URINE

## 2013-07-17 LAB — MAGNESIUM: Magnesium: 2 mg/dL (ref 1.80–2.40)

## 2013-07-17 LAB — PTH, INTACT: PTH: 39.9 pg/mL (ref 14.0–72.0)

## 2013-08-19 MED ORDER — OMEPRAZOLE 20 MG PO CPDR
20 MG | ORAL_CAPSULE | ORAL | Status: DC
Start: 2013-08-19 — End: 2013-11-15

## 2013-10-19 MED ORDER — LEVOTHYROXINE SODIUM 75 MCG PO TABS
75 MCG | ORAL_TABLET | ORAL | Status: DC
Start: 2013-10-19 — End: 2014-04-16

## 2013-10-19 MED ORDER — TRIAMTERENE-HCTZ 37.5-25 MG PO CAPS
37.5-25 | ORAL_CAPSULE | ORAL | 1.00 refills | Status: DC
Start: 2013-10-19 — End: 2014-07-16

## 2013-11-03 LAB — MAGNESIUM: Magnesium: 2.3 mg/dL (ref 1.80–2.40)

## 2013-11-03 LAB — BASIC METABOLIC PANEL
Anion Gap: 16 (ref 3–16)
BUN: 25 mg/dL — ABNORMAL HIGH (ref 7–20)
CO2: 27 mmol/L (ref 21–32)
Calcium: 10.3 mg/dL (ref 8.3–10.6)
Chloride: 94 mmol/L — ABNORMAL LOW (ref 99–110)
Creatinine: 1.5 mg/dL — ABNORMAL HIGH (ref 0.6–1.2)
GFR African American: 41 — AB (ref >60)
GFR Non-African American: 34 — AB (ref >60)
Glucose: 115 mg/dL — ABNORMAL HIGH (ref 70–99)
Potassium: 3.8 mmol/L (ref 3.5–5.1)
Sodium: 137 mmol/L (ref 136–145)

## 2013-11-03 LAB — CBC
Hematocrit: 39.7 % (ref 36.0–48.0)
Hemoglobin: 12.7 g/dL (ref 12.0–16.0)
MCH: 26.6 pg (ref 26.0–34.0)
MCHC: 32 g/dL (ref 31.0–36.0)
MCV: 83.2 fL (ref 80.0–100.0)
MPV: 9.9 fL (ref 5.0–10.5)
Platelets: 295 10*3/uL (ref 135–450)
RBC: 4.77 M/uL (ref 4.00–5.20)
RDW: 14.3 % (ref 12.4–15.4)
WBC: 5.4 10*3/uL (ref 4.0–11.0)

## 2013-11-03 LAB — PHOSPHORUS: Phosphorus: 3.8 mg/dL (ref 2.5–4.9)

## 2013-11-03 LAB — TSH: TSH: 3.07 u[IU]/mL (ref 0.27–4.20)

## 2013-11-03 LAB — T4, FREE: T4 Free: 1.2 ng/ml (ref 0.9–1.8)

## 2013-11-03 MED ORDER — ALPRAZOLAM 0.25 MG PO TABS
0.25 MG | ORAL_TABLET | Freq: Two times a day (BID) | ORAL | Status: AC | PRN
Start: 2013-11-03 — End: 2014-11-03

## 2013-11-03 NOTE — Telephone Encounter (Signed)
[facility] Dr. Doristine Johns needs a referral thru [facility] for [place].    Dr. Doristine Johns  [address]  [place] [address]  [phone]  Tax ID [id number]   NPI [id number]    Dx 426.3, 785.1  [phone]  Member ID [place]    Fax to [phone]

## 2013-11-03 NOTE — Telephone Encounter (Signed)
Rikki Spearing w/[facility] is returning Dr Boneta Lucks phone call.

## 2013-11-03 NOTE — Telephone Encounter (Signed)
Referral done thru [employer].  Certification Number [id number]  Transaction ID [id number]  Valid til 01-31-2014.    Faxed Info to [place].

## 2013-11-03 NOTE — Patient Instructions (Signed)
See in 6 months

## 2013-11-03 NOTE — Telephone Encounter (Signed)
Staff called back and had pneumonia 23 on 06/10/13

## 2013-11-03 NOTE — Progress Notes (Signed)
 Subjective:      Patient ID: Jodi Walls is a 73 y.o. female.    HPI Comments: Patient presents with:  6 Month Follow-Up: hypertension, patient is fasting for labs, Medication Refills    While at rehab, [facility] in [place] had a pneumonia shot in February    sheis overall well and no c/o.  She seldom uses the xanax  and does need refill. She has used 60 in the last year    Date of Birth:  06/13/1940    Date of Visit:  11/03/2013     -- Codeine -- Nausea And Vomiting    Current Outpatient Prescriptions:  ALPRAZolam  (XANAX ) 0.25 MG tablet, Take 1 tablet by mouth 2 times daily as needed for Anxiety, Disp: 30 tablet, Rfl: 1  levothyroxine  (SYNTHROID ) 75 MCG tablet, TAKE 1 TABLET BY MOUTH EVERY DAY, Disp: 90 tablet, Rfl: 0  triamterene -hydrochlorothiazide (DYAZIDE) 37.5-25 MG per capsule, TAKE ONE CAPSULE BY MOUTH EVERY DAY, Disp: 90 capsule, Rfl: 1  omeprazole  (PRILOSEC) 20 MG capsule, TAKE ONE CAPSULE BY MOUTH EVERY DAY, Disp: 90 capsule, Rfl: 0  potassium chloride  SA (K-DUR;KLOR-CON  M) 10 MEQ tablet, Take 10 mEq by mouth 3 times daily., Disp: , Rfl:   gabapentin  (NEURONTIN ) 300 MG capsule, TAKE 1 CAPSULE BY MOUTH EVERY DAY WITH SUPPER, Disp: 90 capsule, Rfl: 2  aspirin  81 MG tablet, Take 81 mg by mouth daily., Disp: , Rfl:   vitamin D (CHOLECALCIFEROL) 1000 UNIT TABS tablet, Take 1,000 Units by mouth daily., Disp: , Rfl:     No current facility-administered medications for this visit.      ----------------------------                11/03/13                       1000         ----------------------------   BP:           130/74         Temp:   98.4 F (36.9 C)                Pulse 72 reg   TempSrc:       Oral          Height: 5' 0.75 (1.543 m)   Weight: 211 lb (95.709 kg)  ----------------------------  Body mass index is 40.2 kg/(m^2).     Wt Readings from Last 3 Encounters:  11/03/13 : 211 lb (95.709 kg)  05/06/13 : 212 lb (96.163 kg)  01/23/13 : 213 lb (96.616 kg)  BP  Readings from Last 3 Encounters:  11/03/13 : 130/74  05/06/13 : 128/78  01/23/13 : 116/62          Review of Systems   Constitutional: Negative for fever, chills, appetite change and unexpected weight change.   Respiratory: Negative for chest tightness and shortness of breath.    Cardiovascular: Negative for chest pain, palpitations and leg swelling.   Gastrointestinal: Negative for nausea, vomiting, abdominal pain, diarrhea, constipation, blood in stool and abdominal distention.   Neurological: Negative for headaches.       Objective:   Physical Exam   Constitutional: She appears well-developed and well-nourished. No distress.   Cardiovascular: Normal rate, regular rhythm and normal heart sounds.  Exam reveals no gallop and no friction rub.    No murmur heard.  Pulmonary/Chest: Effort normal and breath sounds normal. No accessory muscle usage. No tachypnea. No respiratory  distress. She has no decreased breath sounds. She has no wheezes. She has no rhonchi. She has no rales.   Lymphadenopathy:     She has no cervical adenopathy.        Right: No supraclavicular adenopathy present.        Left: No supraclavicular adenopathy present.   Neurological: She is alert.   Skin: Skin is warm, dry and intact. She is not diaphoretic. No pallor.       Assessment:       1. Essential hypertension, benign     2. Unspecified hypothyroidism  TSH without Reflex    T4, free   3. Elevated glucose  Hemoglobin A1C     Pneumonia i called the [facility] she did get the shot. They were not able to tell me exact type. Instructed to have nursing staff call me   Seeing dr Gerhard Knuckles still and he is getting additional blood work      Plan:      See in 6 months      Orders Placed This Encounter   Medications   . ALPRAZolam  (XANAX ) 0.25 MG tablet     Sig: Take 1 tablet by mouth 2 times daily as needed for Anxiety     Dispense:  30 tablet     Refill:  1

## 2013-11-04 LAB — HEMOGLOBIN A1C
Estimated Avg Glucose: 125.5 mg/dL
Hemoglobin A1C: 6 %

## 2013-11-09 NOTE — Telephone Encounter (Signed)
Pt has an appt today w/ Dr

## 2013-11-16 MED ORDER — OMEPRAZOLE 20 MG PO CPDR
20 MG | ORAL_CAPSULE | ORAL | Status: DC
Start: 2013-11-16 — End: 2014-02-10

## 2013-11-17 NOTE — Telephone Encounter (Signed)
Error

## 2014-01-15 MED ORDER — LEVOTHYROXINE SODIUM 75 MCG PO TABS
75 | ORAL_TABLET | ORAL | Status: DC
Start: 2014-01-15 — End: 2014-07-17

## 2014-01-18 MED ORDER — TRIAMTERENE-HCTZ 37.5-25 MG PO CAPS
ORAL_CAPSULE | ORAL | Status: DC
Start: 2014-01-18 — End: 2014-05-04

## 2014-02-11 MED ORDER — OMEPRAZOLE 20 MG PO CPDR
20 MG | ORAL_CAPSULE | ORAL | Status: DC
Start: 2014-02-11 — End: 2014-05-11

## 2014-04-16 MED ORDER — LEVOTHYROXINE SODIUM 75 MCG PO TABS
75 MCG | ORAL_TABLET | ORAL | Status: DC
Start: 2014-04-16 — End: 2014-05-04

## 2014-05-04 ENCOUNTER — Ambulatory Visit: Admit: 2014-05-04 | Discharge: 2014-05-04 | Payer: MEDICARE | Attending: Family Medicine | Primary: Family Medicine

## 2014-05-04 DIAGNOSIS — I10 Essential (primary) hypertension: Secondary | ICD-10-CM

## 2014-05-04 MED ORDER — AMOXICILLIN 500 MG PO CAPS
500 | ORAL_CAPSULE | Freq: Three times a day (TID) | ORAL | 0.00 refills | 9.00 days | Status: AC
Start: 2014-05-04 — End: 2014-05-14

## 2014-05-04 NOTE — Patient Instructions (Addendum)
Do try and trim the weight back  Be careful of salts and fats in the diet  See [name] in 6 months  Do see Dr. Volney Presser for the breast checks  See dr haberthier for the colon check and the scope consideration on the stomach as well      Self- Management Goals for the Patient with Hypertension:    It is important to have goals to work towards when you have Hypertension.    Below is a list of goals your doctor would like you to work towards to help control your hypertension and also maintain and improve your overall health.    Please select one of these goals to try before your next follow up visit:    Goal: I will take all medications as prescribed by my doctor, and I will call the office if I am having any medication problems.    Guess your barriers before they happen. Everyone runs into barriers to their goals. You may already know what's going to get in your way. Write down these problems (cost? time? stress? fear?), and think of ways to get around them.     Barriers to success: none  Plan for overcoming my barriers: N/A     Confidence: 10/10  Date goal set: 05/04/14  Date goal attained:          DASH Diet: Care Instructions  Your Care Instructions  The DASH diet is an eating plan that can help lower your blood pressure. DASH stands for Dietary Approaches to Stop Hypertension. Hypertension is high blood pressure.  The DASH diet focuses on eating foods that are high in calcium, potassium, and magnesium. These nutrients can lower blood pressure. The foods that are highest in these nutrients are fruits, vegetables, low-fat dairy products, nuts, seeds, and legumes. But taking calcium, potassium, and magnesium supplements instead of eating foods that are high in those nutrients does not have the same effect. The DASH diet also includes whole grains, fish, and poultry.  The DASH diet is one of several lifestyle changes your doctor may recommend to lower your high blood pressure. Your doctor may also want you to decrease the  amount of sodium in your diet. Lowering sodium while following the DASH diet can lower blood pressure even further than just the DASH diet alone.  Follow-up care is a key part of your treatment and safety. Be sure to make and go to all appointments, and call your doctor if you are having problems. It's also a good idea to know your test results and keep a list of the medicines you take.  How can you care for yourself at home?  Following the DASH diet  ?? Eat 4 to 5 servings of fruit each day. A serving is 1 medium-sized piece of fruit, ?? cup chopped or canned fruit, 1/4 cup dried fruit, or 4 ounces (?? cup) of fruit juice. Choose fruit more often than fruit juice.  ?? Eat 4 to 5 servings of vegetables each day. A serving is 1 cup of lettuce or raw leafy vegetables, ?? cup of chopped or cooked vegetables, or 4 ounces (?? cup) of vegetable juice. Choose vegetables more often than vegetable juice.  ?? Get 2 to 3 servings of low-fat and fat-free dairy each day. A serving is 8 ounces of milk, 1 cup of yogurt, or 1 ?? ounces of cheese.  ?? Eat 6 to 8 servings of grains each day. A serving is 1 slice of bread, 1 ounce  of dry cereal, or ?? cup of cooked rice, pasta, or cooked cereal. Try to choose whole-grain products as much as possible.  ?? Limit lean meat, poultry, and fish to 2 servings each day. A serving is 3 ounces, about the size of a deck of cards.  ?? Eat 4 to 5 servings of nuts, seeds, and legumes (cooked dried beans, lentils, and split peas) each week. A serving is 1/3 cup of nuts, 2 tablespoons of seeds, or ?? cup of cooked beans or peas.  ?? Limit fats and oils to 2 to 3 servings each day. A serving is 1 teaspoon of vegetable oil or 2 tablespoons of salad dressing.  ?? Limit sweets and added sugars to 5 servings or less a week. A serving is 1 tablespoon jelly or jam, ?? cup sorbet, or 1 cup of lemonade.  ?? Eat less than 2,300 milligrams (mg) of sodium a day. If you have high blood pressure, diabetes, or chronic kidney  disease, if you are African-American, or if you are older than age 61, try to limit the amount of sodium you eat to less than 1,500 mg a day.  Tips for success  ?? Start small. Do not try to make dramatic changes to your diet all at once. You might feel that you are missing out on your favorite foods and then be more likely to not follow the plan. Make small changes, and stick with them. Once those changes become habit, add a few more changes.  ?? Try some of the following:  ?? Make it a goal to eat a fruit or vegetable at every meal and at snacks. This will make it easy to get the recommended amount of fruits and vegetables each day.  ?? Try yogurt topped with fruit and nuts for a snack or healthy dessert.  ?? Add lettuce, tomato, cucumber, and onion to sandwiches.  ?? Combine a ready-made pizza crust with low-fat mozzarella cheese and lots of vegetable toppings. Try using tomatoes, squash, spinach, broccoli, carrots, cauliflower, and onions.  ?? Have a variety of cut-up vegetables with a low-fat dip as an appetizer instead of chips and dip.  ?? Sprinkle sunflower seeds or chopped almonds over salads. Or try adding chopped walnuts or almonds to cooked vegetables.  ?? Try some vegetarian meals using beans and peas. Add garbanzo or kidney beans to salads. Make burritos and tacos with mashed pinto beans or black beans.   Where can you learn more?   Go to [email or web address] and sign in to your MyChart account. Enter [phone] in the [place] box to learn more about ???DASH Diet: Care Instructions.???    If you do not have an account, please click on the ???Sign Up Now??? link.     ?? 2006-2015 Healthwise, Inc[employer]. Care instructions adapted under license by [facility]. This care instruction is for use with your licensed healthcare professional. If you have questions about a medical condition or this instruction, always ask your healthcare professional. [place] any  warranty or liability for your use of this information.  Content Version: 10.6.465758; Current as of: June 19, 2013              Low Sodium Diet (2,000 Milligram): Care Instructions  Your Care Instructions  Too much sodium causes your body to hold on to extra water. This can raise your blood pressure and force your heart and kidneys to work harder. In very serious cases, this could cause you to be put  in the hospital. It might even be life-threatening. By limiting sodium, you will feel better and lower your risk of serious problems.  The most common source of sodium is salt. People get most of the salt in their diet from canned, prepared, and packaged foods. Fast food and restaurant meals also are very high in sodium. Your doctor will probably limit your sodium to less than 2,000 milligrams (mg) a day. This limit counts all the sodium in prepared and packaged foods and any salt you add to your food.  And try to further reduce how much sodium you eat to less than 1,500 mg a day if you are 60 or older, are black, or have high blood pressure, diabetes, or chronic kidney disease.  Follow-up care is a key part of your treatment and safety. Be sure to make and go to all appointments, and call your doctor if you are having problems. It's also a good idea to know your test results and keep a list of the medicines you take.  How can you care for yourself at home?  Read food labels  ?? Read labels on cans and food packages. The labels tell you how much sodium is in each serving. Make sure that you look at the serving size. If you eat more than the serving size, you have eaten more sodium.  ?? Food labels also tell you the Percent Daily Value for sodium. Choose products with low Percent Daily Values for sodium.  ?? Be aware that sodium can come in forms other than salt, including monosodium glutamate (MSG), sodium citrate, and sodium bicarbonate (baking soda). MSG is often added to Asian food. When you eat out, you can  sometimes ask for food without MSG or added salt.  Buy low-sodium foods  ?? Buy foods that are labeled "unsalted" (no salt added), "sodium-free" (less than 5 mg of sodium per serving), or "low-sodium" (less than 140 mg of sodium per serving). Foods labeled "reduced-sodium" and "light sodium" may still have too much sodium. Be sure to read the label to see how much sodium you are getting.  ?? Buy fresh vegetables, or frozen vegetables without added sauces. Buy low-sodium versions of canned vegetables, soups, and other canned goods.  Prepare low-sodium meals  ?? Cut back on the amount of salt you use in cooking. This will help you adjust to the taste. Do not add salt after cooking. One teaspoon of salt has about 2,300 mg of sodium.  ?? Take the salt shaker off the table.  ?? Flavor your food with garlic, lemon juice, onion, vinegar, herbs, and spices. Do not use soy sauce, lite soy sauce, steak sauce, onion salt, garlic salt, celery salt, mustard, or ketchup on your food.  ?? Use low-sodium salad dressings, sauces, and ketchup. Or make your own salad dressings and sauces without adding salt.  ?? Use less salt (or none) when recipes call for it. You can often use half the salt a recipe calls for without losing flavor. Other foods such as rice, pasta, and grains do not need added salt.  ?? Rinse canned vegetables, and cook them in fresh water. This removes some--but not all--of the salt.  ?? Avoid water that is naturally high in sodium or that has been treated with water softeners, which add sodium. Call your local water company to find out the sodium content of your water supply. If you buy bottled water, read the label and choose a sodium-free brand.  Avoid high-sodium foods  ??  Avoid eating:  ?? Smoked, cured, salted, and canned meat, fish, and poultry.  ?? Ham, bacon, hot dogs, and luncheon meats.  ?? Regular, hard, and processed cheese and regular peanut butter.  ?? Crackers with salted tops, and other salted snack foods such as  pretzels, chips, and salted popcorn.  ?? Frozen prepared meals, unless labeled low-sodium.  ?? Canned and dried soups, broths, and bouillon, unless labeled sodium-free or low-sodium.  ?? Canned vegetables, unless labeled sodium-free or low-sodium.  ?? [place] fries, pizza, tacos, and other fast foods.  ?? Pickles, olives, ketchup, and other condiments, especially soy sauce, unless labeled sodium-free or low-sodium.   Where can you learn more?   Go to [email or web address] and sign in to your MyChart account. Enter [id number] in the [place] box to learn more about ???Low Sodium Diet (2,000 Milligram): Care Instructions.???    If you do not have an account, please click on the ???Sign Up Now??? link.     ?? 2006-2015 Healthwise, Inc[employer]. Care instructions adapted under license by [facility]. This care instruction is for use with your licensed healthcare professional. If you have questions about a medical condition or this instruction, always ask your healthcare professional. [place] any warranty or liability for your use of this information.  Content Version: 10.6.465758; Current as of: June 12, 2013

## 2014-05-04 NOTE — Progress Notes (Signed)
 Subjective:      Patient ID: Jodi Walls is a 74 y.o. female.    HPI Comments: Patient presents with:  6 Month Follow-Up: hypertension, cholesterol, thyroid- pt is fasting    She is well and no c/o  She did not want the colon yet  She also has not done the mammogram yet    Some nasal congestion and pnd and runny nose and no fever since christmas.   No cough  Facial pain none. Not getting better    She is getting blood work with dr Gerhard Knuckles soon and we will add hga1c    Date of Birth:  [birth year]    Date of Visit:  05/04/2014     -- Codeine -- Nausea And Vomiting    Current Outpatient Prescriptions:  omeprazole  (PRILOSEC) 20 MG capsule, TAKE ONE CAPSULE BY MOUTH DAILY, Disp: 90 capsule, Rfl: 0  levothyroxine  (SYNTHROID ) 75 MCG tablet, TAKE 1 TABLET BY MOUTH EVERY DAY, Disp: 90 tablet, Rfl: 0  ALPRAZolam  (XANAX ) 0.25 MG tablet, Take 1 tablet by mouth 2 times daily as needed for Anxiety, Disp: 30 tablet, Rfl: 1  triamterene -hydrochlorothiazide (DYAZIDE) 37.5-25 MG per capsule, TAKE ONE CAPSULE BY MOUTH EVERY DAY, Disp: 90 capsule, Rfl: 1  potassium chloride  SA (K-DUR;KLOR-CON  M) 10 MEQ tablet, Take 10 mEq by mouth 3 times daily., Disp: , Rfl:   gabapentin  (NEURONTIN ) 300 MG capsule, TAKE 1 CAPSULE BY MOUTH EVERY DAY WITH SUPPER, Disp: 90 capsule, Rfl: 2  aspirin  81 MG tablet, Take 81 mg by mouth daily., Disp: , Rfl:   vitamin D (CHOLECALCIFEROL) 1000 UNIT TABS tablet, Take 1,000 Units by mouth daily., Disp: , Rfl:     No current facility-administered medications for this visit.      -----------------------------------                   05/04/14                              1013             -----------------------------------   BP:              134/80             Pulse:             72               Temp:       97.5 F (36.4 C)       TempSrc:          Oral              Height:    5' 0.75 (1.543 m)       Weight: 216 lb 9.6 oz (98.249 kg)  -----------------------------------  Body mass index  is 41.27 kg/(m^2).     Wt Readings from Last 3 Encounters:  05/04/14 : 216 lb 9.6 oz (98.249 kg)  11/03/13 : 211 lb (95.709 kg)  05/06/13 : 212 lb (96.163 kg)  BP Readings from Last 3 Encounters:  05/04/14 : 134/80  11/03/13 : 130/74  05/06/13 : 128/78    Past Medical History:    Hypothyroidism  Cancer (HCC)                                                  Kidney failure                                                  Comment:stage 3  Past Surgical History:    APPENDECTOMY                                                   BACK SURGERY                                                   NECK SURGERY                                                   BREAST SURGERY                                   1995            Comment:right mastectomy    CHOLECYSTECTOMY                                                HYSTERECTOMY                                                     Comment:TAH-BSO. no  cancer  Review of patient's family history indicates:    Cancer                         Mother                      Comment: lung     Cancer                         Father                      Comment: bladder    Heart Disease                  Father                    High Blood Pressure            Father  Cancer                         Maternal Aunt               Comment: ovarian    Cancer                         Paternal Cousin             Comment: ovarian    Social History    Marital Status: Widowed             Spouse Name:                       Years of Education:                 Number of children:               Occupational History    None on file    Social History Main Topics    Smoking Status: Never Smoker                      Smokeless Status: Never Used                        Alcohol Use: No              Drug Use: No                           Review of Systems   Constitutional: Negative for fever, chills, appetite change and unexpected weight change.    Respiratory: Negative for cough, chest tightness and shortness of breath.    Cardiovascular: Negative for chest pain, palpitations and leg swelling.   Gastrointestinal: Negative for nausea, vomiting, abdominal pain, diarrhea, constipation, blood in stool and abdominal distention.        No gerd no dysphagia   Genitourinary: Negative for dysuria, frequency, hematuria, enuresis and difficulty urinating.   Neurological: Negative for headaches.       Objective:   Physical Exam   Constitutional: She appears well-developed and well-nourished. No distress.   HENT:   Head: Normocephalic and atraumatic.   Right Ear: Tympanic membrane and ear canal normal.   Left Ear: Tympanic membrane and ear canal normal.   Nose: Nose normal.   Mouth/Throat: Uvula is midline, oropharynx is clear and moist and mucous membranes are normal. No oral lesions.   Neck: Neck supple.   Cardiovascular: Normal rate, regular rhythm and normal heart sounds.  Exam reveals no gallop and no friction rub.    No murmur heard.       Pulmonary/Chest: Effort normal and breath sounds normal. No accessory muscle usage. No tachypnea. No respiratory distress. She has no decreased breath sounds. She has no wheezes. She has no rhonchi. She has no rales.   Abdominal: Soft. Normal appearance and bowel sounds are normal. She exhibits no distension, no abdominal bruit and no mass. There is no hepatosplenomegaly. There is no tenderness. There is no guarding.   Lymphadenopathy:        Head (right side): No submental and no submandibular adenopathy present.        Head (left side): No submental and no submandibular adenopathy present.     She has no cervical adenopathy.  Right: No supraclavicular adenopathy present.        Left: No supraclavicular adenopathy present.   Neurological: She is alert.   Skin: Skin is warm, dry and intact. She is not diaphoretic. No cyanosis. No pallor. Nails show no clubbing.   Psychiatric: She has a normal mood and affect.        Assessment:       1. Essential hypertension, benign     2. HX: breast cancer  MAM Digital Diagnostic Unilateral Left   3. Acquired hypothyroidism     4. Elevated glucose  Hemoglobin A1C   5. Upper respiratory tract infection, unspecified upper respiratory infection         Updated the allergies as she tells me she had a rxn to zpak in past  She still remains on the prilosec and she is again asked to have egd      Plan:      Do try and trim the weight back  Be careful of salts and fats in the diet  See us  in 6 months  Do see Dr. Ezra Holmes for the breast checks  See dr haberthier for the colon check and the scope consideration on the stomach as well        Orders Placed This Encounter   Medications   . amoxicillin  (AMOXIL ) 500 MG capsule     Sig: Take 1 capsule by mouth 3 times daily for 10 days     Dispense:  30 capsule     Refill:  0

## 2014-05-04 NOTE — Progress Notes (Signed)
SODIUM (mmol/L)   Date Value   11/03/2013 137    BUN (mg/dL)   Date Value   11/03/2013 25*    GLUCOSE (mg/dL)   Date Value   11/03/2013 115*   08/15/2009 92      POTASSIUM (mmol/L)   Date Value   11/03/2013 3.8    CREATININE (mg/dL)   Date Value   11/03/2013 1.5*           BP Readings from Last 2 Encounters:   11/03/13 130/74   05/06/13 128/78       Is patient currently taking any antihypertensive medications?     Yes   If yes, see med list as above    Is the patient reporting any side effects of antihypertensive medications?   No    Is the patient taking any over the counter medications?    Yes   If yes, see med list as above    Is the patient taking a daily aspirin?    Yes

## 2014-05-11 MED ORDER — GABAPENTIN 300 MG PO CAPS
300 | ORAL_CAPSULE | ORAL | 1.00 refills | 30.00 days | Status: DC
Start: 2014-05-11 — End: 2014-11-25

## 2014-05-11 MED ORDER — OMEPRAZOLE 20 MG PO CPDR
20 MG | ORAL_CAPSULE | ORAL | Status: DC
Start: 2014-05-11 — End: 2014-11-04

## 2014-05-27 LAB — CBC
Hematocrit: 37 % (ref 36.0–48.0)
Hemoglobin: 11.7 g/dL — ABNORMAL LOW (ref 12.0–16.0)
MCH: 25.3 pg — ABNORMAL LOW (ref 26.0–34.0)
MCHC: 31.7 g/dL (ref 31.0–36.0)
MCV: 79.9 fL — ABNORMAL LOW (ref 80.0–100.0)
MPV: 9.1 fL (ref 5.0–10.5)
Platelets: 252 10*3/uL (ref 135–450)
RBC: 4.62 M/uL (ref 4.00–5.20)
RDW: 14.5 % (ref 12.4–15.4)
WBC: 5.9 10*3/uL (ref 4.0–11.0)

## 2014-05-27 LAB — BASIC METABOLIC PANEL
Anion Gap: 15 (ref 3–16)
BUN: 23 mg/dL — ABNORMAL HIGH (ref 7–20)
CO2: 24 mmol/L (ref 21–32)
Calcium: 9.8 mg/dL (ref 8.3–10.6)
Chloride: 98 mmol/L — ABNORMAL LOW (ref 99–110)
Creatinine: 1.3 mg/dL — ABNORMAL HIGH (ref 0.6–1.2)
GFR African American: 48 — AB (ref >60)
GFR Non-African American: 40 — AB (ref >60)
Glucose: 111 mg/dL — ABNORMAL HIGH (ref 70–99)
Potassium: 3.6 mmol/L (ref 3.5–5.1)
Sodium: 137 mmol/L (ref 136–145)

## 2014-05-27 LAB — VITAMIN D 25 HYDROXY: Vit D, 25-Hydroxy: 27.1 ng/mL — ABNORMAL LOW (ref >=30)

## 2014-05-27 LAB — PHOSPHORUS: Phosphorus: 2.8 mg/dL (ref 2.5–4.9)

## 2014-05-27 LAB — MAGNESIUM: Magnesium: 2.1 mg/dL (ref 1.80–2.40)

## 2014-05-27 LAB — PROTEIN, URINE, RANDOM: Protein, Ur: 19 mg/dL — ABNORMAL HIGH (ref <12)

## 2014-05-27 LAB — CREATININE, RANDOM URINE: Creatinine, Ur: 136.5 mg/dL (ref 28.0–259.0)

## 2014-05-28 LAB — HEMOGLOBIN A1C
Estimated Avg Glucose: 125.5 mg/dL
Hemoglobin A1C: 6 %

## 2014-06-09 ENCOUNTER — Encounter

## 2014-06-16 ENCOUNTER — Encounter

## 2014-06-17 LAB — CBC
Hematocrit: 35.3 % — ABNORMAL LOW (ref 36.0–48.0)
Hemoglobin: 11.4 g/dL — ABNORMAL LOW (ref 12.0–16.0)
MCH: 26.1 pg (ref 26.0–34.0)
MCHC: 32.2 g/dL (ref 31.0–36.0)
MCV: 81.1 fL (ref 80.0–100.0)
MPV: 9.8 fL (ref 5.0–10.5)
Platelets: 283 10*3/uL (ref 135–450)
RBC: 4.36 M/uL (ref 4.00–5.20)
RDW: 15.7 % — ABNORMAL HIGH (ref 12.4–15.4)
WBC: 6.3 10*3/uL (ref 4.0–11.0)

## 2014-06-17 LAB — VITAMIN B12 & FOLATE
Folate: 9.16 ng/mL (ref 3.10–17.50)
Vitamin B-12: 457 pg/mL (ref 211–911)

## 2014-06-17 LAB — IRON AND TIBC
Iron % Saturation: 17 % (ref 15–50)
Iron: 62 ug/dL (ref 37–145)
TIBC: 356 ug/dL (ref 260–445)

## 2014-06-21 NOTE — Telephone Encounter (Signed)
noted 

## 2014-06-21 NOTE — Telephone Encounter (Signed)
Noted. Medical records, please close when you are done with chart.

## 2014-06-21 NOTE — Telephone Encounter (Signed)
np, dx anemia, ref by Dr. Barron Alvine [phone],  per pt. we have permission to request records from Dr. Chriss Czar done, ins- [place] [employer], np appt scheduled per first available per physician preferences, np appt scheduled for 2.26.16 at 12:15p.m. aes

## 2014-06-25 ENCOUNTER — Ambulatory Visit
Admit: 2014-06-25 | Discharge: 2014-06-25 | Payer: MEDICARE | Attending: Hematology & Oncology | Primary: Family Medicine

## 2014-06-25 DIAGNOSIS — D500 Iron deficiency anemia secondary to blood loss (chronic): Secondary | ICD-10-CM

## 2014-06-25 NOTE — Progress Notes (Signed)
HEMATOLOGY/ONCOLOGY CONSULTATION:     06/25/2014 9:42 PM    REASON FOR CONSULT:     PROVIDERS:  Barron Alvine, MD    CHIEF COMPLAINT:     Chief Complaint   Patient presents with   ??? New Patient       HISTORY OF PRESENT ILLNESS:     HPI:        Jodi Walls was referred for further workup and management of her anemia.  In terms of her recent history, she was seeing Dr. Jacelyn Grip as part of a routine visit and her Hb was 11.4 with a normal WBC count and plt count.  She is otherwise at her baseline state of health and denies sob/cp, melana, hematochezia,  She denies any weight loss.  Her main complaint is mild to moderate chronic fatigue.       No history exists.       PAST MEDICAL HISTORY:     Past Medical History   Diagnosis Date   ??? Hypothyroidism    ??? Cancer ([place])    ??? Kidney failure      stage 3       PAST SURGICAL HISTORY:        Past Surgical History   Procedure Laterality Date   ??? Appendectomy     ??? Back surgery     ??? Neck surgery     ??? Breast surgery  1995     right mastectomy   ??? Cholecystectomy     ??? Hysterectomy       TAH-BSO. no  cancer       SOCIAL HISTORY:     History     Social History   ??? Marital Status: Widowed     Spouse Name: N/A     Number of Children: N/A   ??? Years of Education: N/A     Occupational History   ??? Not on file.     Social History Main Topics   ??? Smoking status: Never Smoker    ??? Smokeless tobacco: Never Used   ??? Alcohol Use: No   ??? Drug Use: No   ??? Sexual Activity: Not on file     Other Topics Concern   ??? Not on file     Social History Narrative       FAMILY HISTORY:     Family History   Problem Relation Age of Onset   ??? Cancer Mother      lung    ??? Cancer Father      bladder   ??? Heart Disease Father    ??? High Blood Pressure Father    ??? Cancer Maternal Aunt      ovarian   ??? Cancer Paternal Cousin      ovarian       ALLERGIES:     Allergies as of 06/25/2014 - Review Complete 06/25/2014   Allergen Reaction Noted   ??? Zithromax [azithromycin] Swelling 05/04/2014   ??? Codeine Nausea And  Vomiting        MEDICATIONS:     Current Outpatient Prescriptions on File Prior to Visit   Medication Sig Dispense Refill   ??? omeprazole (PRILOSEC) 20 MG capsule TAKE 1 CAPSULE BY MOUTH DAILY 90 capsule 1   ??? gabapentin (NEURONTIN) 300 MG capsule TAKE ONE CAPSULE BY MOUTH EVERY DAY WITH SUPPER 90 capsule 1   ??? levothyroxine (SYNTHROID) 75 MCG tablet TAKE 1 TABLET BY MOUTH EVERY DAY 90 tablet 0   ??? ALPRAZolam (  XANAX) 0.25 MG tablet Take 1 tablet by mouth 2 times daily as needed for Anxiety 30 tablet 1   ??? triamterene-hydrochlorothiazide (DYAZIDE) 37.5-25 MG per capsule TAKE ONE CAPSULE BY MOUTH EVERY DAY 90 capsule 1   ??? potassium chloride SA (K-DUR;KLOR-CON M) 10 MEQ tablet Take 10 mEq by mouth 3 times daily.     ??? aspirin 81 MG tablet Take 81 mg by mouth daily.     ??? vitamin D (CHOLECALCIFEROL) 1000 UNIT TABS tablet Take 1,000 Units by mouth daily.       No current facility-administered medications on file prior to visit.           REVIEW OF SYSTEMS:       ?? Constitutional: Denies fever, sweats, weight loss     ?? Eyes: No visual changes or diplopia. No scleral icterus.  ?? ENT: No Headaches, hearing loss or vertigo. No mouth sores or sore throat.  ?? Cardiovascular: No chest pain, dyspnea on exertion, palpitations or loss of consciousness.   ?? Respiratory: No cough or wheezing, no sputum production. No hemoptysis..    ?? Gastrointestinal: No abdominal pain, appetite loss, blood in stools. No change in bowel habits.  ?? Genitourinary: No dysuria, trouble voiding, or hematuria.  ?? Musculoskeletal:  Generalized weakness. No joint complaints.  ?? Integumentary: No rash or pruritis.  ?? Neurological: No headache, diplopia. No change in gait, balance, or coordination. No paresthesias.  ?? Endocrine: No temperature intolerance. No excessive thirst, fluid intake, or urination.   ?? Hematologic/Lymphatic: No abnormal bruising or ecchymoses, blood clots or swollen lymph nodes.  ?? Allergic/Immunologic: No nasal congestion or hives.        PHYSICAL EXAM:       BP 144/88 mmHg   Pulse 66   Temp(Src) 96.9 ??F (36.1 ??C) (Oral)   Resp 18   Ht 5' 0.63" (1.54 m)   Wt 222 lb (100.699 kg)   BMI 42.46 kg/m2    General appearance: alert and cooperative  Head: Normocephalic, without obvious abnormality, atraumatic  Neck: No palpable lymphadenopathy in supraclavicular or cervical chains  Lungs: Clear to auscultation bilaterally, no audible rales, wheezes or crackles  Heart: Regular rate and rhythm, S1, S2 normal  Abdomen: Soft, non-tender; bowel sounds normal; no masses,  no organomegaly  Extremities: without cyanosis, clubbing, edema or asymmetry  Skin: No jaundice, purpura or petechiae          LABS:     Lab Results   Component Value Date    WBC 6.3 06/16/2014    HGB 11.4* 06/16/2014    HCT 35.3* 06/16/2014    MCV 81.1 06/16/2014    PLT 283 06/16/2014    LYMPHOPCT 28.4 12/20/2011    RBC 4.36 06/16/2014    MCH 26.1 06/16/2014    MCHC 32.2 06/16/2014    RDW 15.7* 06/16/2014             @LASTINR @    Lab Results   Component Value Date    GLUCOSE 111* 05/27/2014    BUN 23* 05/27/2014    CREATININE 1.3* 05/27/2014    K 3.6 05/27/2014    PHOS 2.8 05/27/2014       Lab Results   Component Value Date    ALKPHOS 105 05/06/2013    AST 32 05/06/2013       Lab Results   Component Value Date    MG 2.10 05/27/2014       No results found for: URICACID    No  results found for: LDH      Lab Results   Component Value Date    IRON 29* 06/25/2014    TIBC 393 06/25/2014    FERRITIN 27.8 06/25/2014       IMAGING:     No procedure found.    STAGING:     No matching staging information was found for the patient.    ASSESSMENT:     Problem List Items Addressed This Visit     None      Visit Diagnoses     Iron deficiency anemia due to chronic blood loss    -  Primary     Relevant Orders        Protein Electrophoresis, Serum        Ferritin (Completed)        Iron (Completed)        TIBC AND %SATURATION (Completed)        CBC Auto Differential                 PLAN:     1. Anemia.  Her  iron studies and B12 and folate were recently normal.  Will check a spep to rule out hemolysis and a haptoglobin to rule out hemolysis.  If the additional blood work is negative, I suspect her anemia is due to her CRI.  She could have early MDS as well.  I would not treat this unless her Hb falls below 10.  She is not on any meds that cause her anemia and does not have hepatosplenomegaly on exam.  She will return in 2 months.    Thank you for the consultation and please call with any questions.    Zenia Resides, MD  [phone]

## 2014-06-28 LAB — TIBC AND %SATURATION
% SATURATION: 7 % — ABNORMAL LOW (ref 15–50)
TIBC: 393 ug/dL (ref 250–450)

## 2014-06-28 LAB — PROTEIN, TOTAL: Total Protein: 7.2 gm/dL (ref 6.4–8.2)

## 2014-06-28 LAB — ELECTROPHORESIS PROTEIN, SERUM
Albumin %: 61 %
Albumin: 4.39 gm/dL (ref 4.00–5.30)
Alpha 1 %: 2.6 %
Alpha 1: 0.19 gm/dL (ref 0.10–0.30)
Alpha 2 %: 12.7 %
Alpha 2: 0.91 gm/dL (ref 0.50–1.00)
Beta Percent: 12.8 %
Beta: 0.92 gm/dL (ref 0.60–1.10)
Gamma Globulin %: 10.9 %
Gamma: 0.78 gm/dL (ref 0.60–1.60)
Total Protein: 7.2 gm/dL (ref 6.4–8.2)

## 2014-06-28 LAB — IMMUNOFIXATION SERUM PROFILE

## 2014-06-28 LAB — IRON: Iron: 29 ug/dL — ABNORMAL LOW (ref 50–170)

## 2014-06-28 LAB — FERRITIN: Ferritin: 27.8 ng/mL (ref 8.0–255.0)

## 2014-06-30 MED ORDER — FERROUS SULFATE 325 (65 FE) MG PO TABS
325 (65 Fe) MG | ORAL_TABLET | Freq: Every day | ORAL | Status: DC
Start: 2014-06-30 — End: 2015-04-08

## 2014-06-30 NOTE — Telephone Encounter (Signed)
Patient called regarding Blood Work Results from 06/25/2014. Please call.

## 2014-06-30 NOTE — Telephone Encounter (Signed)
Spoke to Mrs Steinmeyer and per Dr Allegra Lai labs showed iron is low and Dr Allegra Lai would like Mrs Hansley to take Ferrous sulfate 325 mg one time daily.  This was escribed to [facility] in [place]. She should also keep her two month follow up appointment.   Mrs Dermody verbalized understanding

## 2014-07-16 MED ORDER — TRIAMTERENE-HCTZ 37.5-25 MG PO CAPS
37.5-25 | ORAL_CAPSULE | ORAL | 1.00 refills | Status: DC
Start: 2014-07-16 — End: 2015-01-13

## 2014-07-17 MED ORDER — LEVOTHYROXINE SODIUM 75 MCG PO TABS
75 MCG | ORAL_TABLET | ORAL | Status: DC
Start: 2014-07-17 — End: 2015-04-14

## 2014-08-12 ENCOUNTER — Ambulatory Visit: Admit: 2014-08-12 | Discharge: 2014-08-12 | Payer: MEDICARE | Attending: Family Medicine | Primary: Family Medicine

## 2014-08-12 DIAGNOSIS — I10 Essential (primary) hypertension: Secondary | ICD-10-CM

## 2014-08-12 NOTE — Progress Notes (Signed)
Subjective:      Patient ID: Jodi Walls is a 74 y.o. female.    HPI Comments: Patient presents with:  3 Month Follow-Up    No c/o she is actually here to early      Review of Systems    Objective:   Physical Exam   Constitutional: She appears well-developed and well-nourished. No distress.   Neurological: She is alert.   Skin: She is not diaphoretic.       Assessment:      She is too early  No charge and told her to get the copay back from front      Plan:      Do see dr haberthier for the colon check  Do follow up with dr Volney Presser for the breast checks  See [name] in the summer. July or august

## 2014-08-12 NOTE — Patient Instructions (Signed)
Do see dr haberthier for the colon check  Do follow up with dr Volney Presser for the breast checks  See [name] in the summer. July or august

## 2014-08-23 ENCOUNTER — Telehealth

## 2014-08-23 NOTE — Telephone Encounter (Signed)
[phone] Jodi Walls states personal history of cancer needs to be secondary.   The primary dx needs to be a physical evidence such as lump for her insurance to cover.

## 2014-08-23 NOTE — Telephone Encounter (Signed)
Unless she has a lump which she does not  We can do screening as the dx  Second is hx of breast ca if that will work  If he is stil having problems with that  Refer the patient back to dr Volney Presser for evaluation and check

## 2014-08-23 NOTE — Telephone Encounter (Signed)
i am not sure what to tell them. It is what it is.  If they need screening vs diagnostic or vice versa that is fine

## 2014-08-23 NOTE — Telephone Encounter (Signed)
New order placed in EP[facility].

## 2014-08-23 NOTE — Telephone Encounter (Signed)
Brad w/ [facility] scheduling is calling to get a new diagnoses and new order for the diagnostic mammogram. The current diagnoses is a history of breast cancer and this won't pass the insurance guidelines. Pl advise.

## 2014-08-25 ENCOUNTER — Ambulatory Visit
Admit: 2014-08-25 | Discharge: 2014-08-25 | Payer: MEDICARE | Attending: Hematology & Oncology | Primary: Family Medicine

## 2014-08-25 DIAGNOSIS — D500 Iron deficiency anemia secondary to blood loss (chronic): Secondary | ICD-10-CM

## 2014-08-25 LAB — CBC WITH AUTO DIFFERENTIAL
Granulocytes %: 58.4 % (ref 37.0–80.0)
Granulocytes: 4 10*3/uL (ref 2.0–7.8)
Hematocrit: 42.2 % (ref 37.7–47.9)
Hemoglobin: 12.6 g/dL (ref 12.2–16.2)
Lymphocyte %: 32.3 % (ref 10.0–50.0)
Lymphocytes: 2.2 10*3/uL (ref 0.6–4.1)
MCH: 26.2 pg — ABNORMAL LOW (ref 27.0–31.2)
MCHC: 29.9 g/dL — ABNORMAL LOW (ref 31.8–35.4)
MCV: 87.8 fL (ref 80.0–97.0)
Mid Cells %: 9.3 % (ref 0.1–24.0)
Mid Cells: 0.6 10*3/uL (ref 0.0–1.8)
Platelets: 220 10*3/uL (ref 142–424)
RBC: 4.81 M/uL (ref 4.04–5.48)
RDW: 18.5 % — ABNORMAL HIGH (ref 11.6–14.8)
WBC: 6.8 10*3/uL (ref 4.6–10.2)

## 2014-08-25 NOTE — Progress Notes (Addendum)
ONCOLOGY FOLLOW-UP:       Primary Oncologist: Zenia Resides M.D.    PROBLEM LIST:       1. Anemia  2. CRI  3. MGUS    INTERVAL HISTORY:       Jodi Walls returns for follow up.  Overall she is doing well and tolerating her oral iron reasonably well other than mild constipation.    REVIEW OF SYSTEMS:         ?? Constitutional: Denies fever, sweats, weight loss     ?? Eyes: No visual changes or diplopia. No scleral icterus.  ?? ENT: No Headaches, hearing loss or vertigo. No mouth sores or sore throat.  ?? Cardiovascular: No chest pain, dyspnea on exertion, palpitations or loss of consciousness.   ?? Respiratory: No cough or wheezing, no sputum production. No hemoptysis..    ?? Gastrointestinal: No abdominal pain, appetite loss, blood in stools. No change in bowel habits.  ?? Genitourinary: No dysuria, trouble voiding, or hematuria.  ?? Musculoskeletal:  Generalized weakness. No joint complaints.  ?? Integumentary: No rash or pruritis.  ?? Neurological: No headache, diplopia. No change in gait, balance, or coordination. No paresthesias.  ?? Endocrine: No temperature intolerance. No excessive thirst, fluid intake, or urination.   ?? Hematologic/Lymphatic: No abnormal bruising or ecchymoses, blood clots or swollen lymph nodes.  ?? Allergic/Immunologic: No nasal congestion or hives.           PHYSICAL EXAM:       BP 128/80 mmHg   Pulse 72   Temp(Src) 97.6 ??F (36.4 ??C) (Oral)   Resp 14   Ht 5' 0.63" (1.54 m)   Wt 224 lb (101.606 kg)   BMI 42.84 kg/m2    General appearance: alert and cooperative  Head: Normocephalic, without obvious abnormality, atraumatic  Neck: No palpable lymphadenopathy in supraclavicular or cervical chains  Lungs: Clear to auscultation bilaterally, no audible rales, wheezes or crackles  Heart: Regular rate and rhythm, S1, S2 normal  Abdomen: Soft, non-tender; bowel sounds normal; no masses,  no organomegaly  Extremities: without cyanosis, clubbing, edema or asymmetry  Skin: No jaundice, purpura or  petechiae        LABS:     CBC:   Lab Results   Component Value Date    WBC 6.8 08/25/2014    WBC 6.3 06/16/2014    NEUTROABS 3.4 12/20/2011    HGB 12.6 08/25/2014    MCV 87.8 08/25/2014    PLT 220 08/25/2014       BMP:   Lab Results   Component Value Date    NA 137 05/27/2014    K 3.6 05/27/2014    CO2 24 05/27/2014    BUN 23 05/27/2014    CREATININE 1.3 05/27/2014    MG 2.10 05/27/2014    TSH 3.07 11/03/2013    TSH 4.37 08/23/2011       HEPATIC:  Lab Results   Component Value Date    AST 32 05/06/2013    ALT 32 05/06/2013    ALKPHOS 105 05/06/2013    PROT 7.2 06/25/2014    PROT 7.2 06/25/2014    PROT 7.1 02/22/2011    BILITOT 0.6 05/06/2013       TUMOR MARKERS: No results found for: PSA, CEA, CA125, CA2729, CA199      IMAGING:     @SEHRISRSLT @    ASSESSMENT AND PLAN:       1. Anemia--repeat iron studies low.  Her Hb has normalized on oral iron.  Will continue daily for 3 months and then stop for 3 months and see her back in 6 months. She should be getting an egd/colon in next month or so.    2. MGUS--recheck M-spike in 6 months      Zenia Resides, MD  [phone]

## 2014-08-30 ENCOUNTER — Inpatient Hospital Stay: Admit: 2014-08-30 | Primary: Family Medicine

## 2014-08-30 DIAGNOSIS — Z853 Personal history of malignant neoplasm of breast: Secondary | ICD-10-CM

## 2014-10-13 MED ORDER — LEVOTHYROXINE SODIUM 75 MCG PO TABS
75 MCG | ORAL_TABLET | ORAL | Status: DC
Start: 2014-10-13 — End: 2014-11-10

## 2014-11-02 ENCOUNTER — Encounter: Attending: Family Medicine | Primary: Family Medicine

## 2014-11-04 MED ORDER — OMEPRAZOLE 20 MG PO CPDR
20 MG | ORAL_CAPSULE | ORAL | 1 refills | Status: DC
Start: 2014-11-04 — End: 2015-05-06

## 2014-11-10 ENCOUNTER — Encounter

## 2014-11-10 ENCOUNTER — Ambulatory Visit: Admit: 2014-11-10 | Discharge: 2014-11-10 | Payer: MEDICARE | Attending: Family Medicine | Primary: Family Medicine

## 2014-11-10 DIAGNOSIS — R635 Abnormal weight gain: Secondary | ICD-10-CM

## 2014-11-10 LAB — LIPID PANEL
Cholesterol, Total: 240 mg/dL — ABNORMAL HIGH (ref 0–199)
HDL: 79 mg/dL — ABNORMAL HIGH (ref 40–60)
LDL Calculated: 141 mg/dL — ABNORMAL HIGH (ref <100)
Triglycerides: 98 mg/dL (ref 0–150)
VLDL Cholesterol Calculated: 20 mg/dL

## 2014-11-10 LAB — COMPREHENSIVE METABOLIC PANEL
ALT: 25 U/L (ref 10–40)
AST: 26 U/L (ref 15–37)
Albumin/Globulin Ratio: 1.6 (ref 1.1–2.2)
Albumin: 4.4 g/dL (ref 3.4–5.0)
Alkaline Phosphatase: 96 U/L (ref 40–129)
Anion Gap: 13 (ref 3–16)
BUN: 21 mg/dL — ABNORMAL HIGH (ref 7–20)
CO2: 27 mmol/L (ref 21–32)
Calcium: 10 mg/dL (ref 8.3–10.6)
Chloride: 100 mmol/L (ref 99–110)
Creatinine: 1.3 mg/dL — ABNORMAL HIGH (ref 0.6–1.2)
GFR African American: 48 — AB (ref >60)
GFR Non-African American: 40 — AB (ref >60)
Globulin: 2.8 g/dL
Glucose: 105 mg/dL — ABNORMAL HIGH (ref 70–99)
Potassium: 4.7 mmol/L (ref 3.5–5.1)
Sodium: 140 mmol/L (ref 136–145)
Total Bilirubin: 0.5 mg/dL (ref 0.0–1.0)
Total Protein: 7.2 g/dL (ref 6.4–8.2)

## 2014-11-10 LAB — HEMOGLOBIN A1C
Estimated Avg Glucose: 116.9 mg/dL
Hemoglobin A1C: 5.7 %

## 2014-11-10 LAB — TSH: TSH: 3.45 u[IU]/mL (ref 0.27–4.20)

## 2014-11-10 LAB — T4, FREE: T4 Free: 1.2 ng/dL (ref 0.9–1.8)

## 2014-11-10 MED ORDER — POTASSIUM CHLORIDE CRYS ER 10 MEQ PO TBCR
10 MEQ | ORAL_TABLET | Freq: Two times a day (BID) | ORAL | 1 refills | Status: DC
Start: 2014-11-10 — End: 2015-02-08

## 2014-11-10 NOTE — Patient Instructions (Signed)
See in 4 months  Stay with the diet  Do remember to see the [facility] doctor to evaluate the colon and stomach  Do keep the appointments with the breast surgeon dr Volney Presser

## 2014-11-10 NOTE — Progress Notes (Signed)
 Subjective:      Patient ID: Jodi Walls is a 74 y.o. female.    HPI Comments: Patient presents with:  3 Month Follow-Up: hypertension, cholesterol, thyroid - pt is fasting    She is overall well  She is trying to watch the diet  She did see the specialist and she did not get the egd or the colon yet  She is off the iron for now    i offered to schedule and she refused    She is seeing dr Felipe Horton for cardiology next week. Dr Parris Bolognese checks her breasts. [place]    Date of Birth:  April 21, 1941    Date of Visit:  11/10/2014     -- Pcn (Penicillins) -- Swelling    --  Face and hands swelled   -- Zithromax (Azithromycin) -- Swelling   -- Codeine -- Nausea And Vomiting    Current Outpatient Prescriptions:  potassium chloride  SA (K-DUR;KLOR-CON  M) 10 MEQ tablet, Take 1 tablet by mouth 2 times daily, Disp: 60 tablet, Rfl: 1  omeprazole  (PRILOSEC) 20 MG capsule, TAKE ONE CAPSULE BY MOUTH EVERY DAY, Disp: 90 capsule, Rfl: 1  levothyroxine  (SYNTHROID ) 75 MCG tablet, TAKE 1 TABLET BY MOUTH EVERY DAY, Disp: 90 tablet, Rfl: 1  triamterene -hydrochlorothiazide (DYAZIDE) 37.5-25 MG per capsule, TAKE ONE CAPSULE BY MOUTH EVERY DAY, Disp: 90 capsule, Rfl: 1  melatonin 3 MG TABS tablet, Take 3 mg by mouth daily, Disp: , Rfl:   gabapentin  (NEURONTIN ) 300 MG capsule, TAKE ONE CAPSULE BY MOUTH EVERY DAY WITH SUPPER, Disp: 90 capsule, Rfl: 1  aspirin  81 MG tablet, Take 81 mg by mouth daily., Disp: , Rfl:   vitamin D (CHOLECALCIFEROL) 1000 UNIT TABS tablet, Take 1,000 Units by mouth daily., Disp: , Rfl:   ferrous sulfate  325 (65 FE) MG tablet, Take 1 tablet by mouth daily (with breakfast), Disp: 30 tablet, Rfl: 3    No current facility-administered medications for this visit.       --------------------------------                  11/10/14                           0903           --------------------------------   BP:             128/76           Site:          Left Arm          Position:        Sitting           Cuff Size:     Large Adult         Pulse:            64             Temp:     97.6 F (36.4 C)      TempSrc:         Oral            Weight: 220 lb 6.4 oz (100 kg)   Height:   5' 0.63 (1.54 m)     --------------------------------  Body mass index is 42.15 kg/(m^2).     Wt Readings from Last 3 Encounters:  11/10/14 : 220 lb 6.4 oz (100 kg)  08/25/14 : 224 lb (101.6 kg)  08/12/14 : 218 lb 12.8 oz (99.2 kg)    BP Readings from Last 3 Encounters:  11/10/14 : 128/76  08/25/14 : 128/80  08/12/14 : 122/74            Review of Systems   Constitutional: Negative for appetite change, chills, fever and unexpected weight change.   Respiratory: Negative for cough, chest tightness and shortness of breath.    Cardiovascular: Negative for chest pain, palpitations and leg swelling.   Gastrointestinal: Negative for abdominal distention, abdominal pain, blood in stool, constipation, diarrhea, nausea and vomiting.        No gerd no dysphagia   Genitourinary: Negative for difficulty urinating.   Neurological: Negative for headaches.   Hematological: Does not bruise/bleed easily.       Objective:   Physical Exam   Constitutional: She appears well-developed and well-nourished. No distress.   HENT:   Head: Normocephalic and atraumatic.   Mouth/Throat: Oropharynx is clear and moist.   Eyes: No scleral icterus.   Neck: Full passive range of motion without pain. Neck supple. Carotid bruit is not present. No thyroid mass and no thyromegaly present.   Cardiovascular: Normal rate, regular rhythm and normal heart sounds.  Exam reveals no gallop and no friction rub.    No murmur heard.       Pulmonary/Chest: Effort normal and breath sounds normal. No accessory muscle usage. No tachypnea. No respiratory distress. She has no decreased breath sounds. She has no wheezes. She has no rhonchi. She has no rales.   Abdominal: Soft. Normal appearance and bowel sounds are normal. She exhibits no distension, no  abdominal bruit, no pulsatile midline mass and no mass. There is no hepatosplenomegaly. There is no tenderness. There is no guarding.   Lymphadenopathy:     She has no cervical adenopathy.        Right: No supraclavicular adenopathy present.        Left: No supraclavicular adenopathy present.   Neurological: She is alert.   Skin: Skin is warm, dry and intact. She is not diaphoretic. No cyanosis. No pallor. Nails show no clubbing.   Psychiatric: She has a normal mood and affect. Her speech is normal.       Assessment:       1. Weight gain  Hemoglobin A1C    Comprehensive Metabolic Panel    TSH without Reflex   2. Glucose intolerance (impaired glucose tolerance)  Hemoglobin A1C    Comprehensive Metabolic Panel    Lipid Panel   3. Essential hypertension, benign  TSH without Reflex    Lipid Panel   4. Acquired hypothyroidism  TSH without Reflex    T4, Free   5. Other and unspecified hyperlipidemia  TSH without Reflex    Lipid Panel       i will fax the mammogram report to dr Ezra Holmes  She declined tetanus and prevnar for now      Plan:      See in 4 months  Stay with the diet  Do remember to see the Gi doctor to evaluate the colon and stomach  Do keep the appointments with the breast surgeon dr Ezra Holmes

## 2014-11-22 LAB — BASIC METABOLIC PANEL
Anion Gap: 15 (ref 3–16)
BUN: 20 mg/dL (ref 7–20)
CO2: 25 mmol/L (ref 21–32)
Calcium: 9.8 mg/dL (ref 8.3–10.6)
Chloride: 99 mmol/L (ref 99–110)
Creatinine: 1.2 mg/dL (ref 0.6–1.2)
GFR African American: 53 — AB (ref >60)
GFR Non-African American: 44 — AB (ref >60)
Glucose: 100 mg/dL — ABNORMAL HIGH (ref 70–99)
Potassium: 4.4 mmol/L (ref 3.5–5.1)
Sodium: 139 mmol/L (ref 136–145)

## 2014-11-22 LAB — CBC
Hematocrit: 42.5 % (ref 36.0–48.0)
Hemoglobin: 13.9 g/dL (ref 12.0–16.0)
MCH: 28.7 pg (ref 26.0–34.0)
MCHC: 32.6 g/dL (ref 31.0–36.0)
MCV: 88.1 fL (ref 80.0–100.0)
MPV: 8.7 fL (ref 5.0–10.5)
Platelets: 224 10*3/uL (ref 135–450)
RBC: 4.83 M/uL (ref 4.00–5.20)
RDW: 13.2 % (ref 12.4–15.4)
WBC: 6.8 10*3/uL (ref 4.0–11.0)

## 2014-11-22 LAB — PROTEIN, URINE, RANDOM: Protein, Ur: 9 mg/dL (ref <12)

## 2014-11-22 LAB — MAGNESIUM: Magnesium: 2 mg/dL (ref 1.80–2.40)

## 2014-11-22 LAB — PHOSPHORUS: Phosphorus: 2.9 mg/dL (ref 2.5–4.9)

## 2014-11-22 LAB — CREATININE, RANDOM URINE: Creatinine, Ur: 43.3 mg/dL (ref 28.0–259.0)

## 2014-11-25 MED ORDER — GABAPENTIN 300 MG PO CAPS
300 MG | ORAL_CAPSULE | ORAL | 1 refills | Status: DC
Start: 2014-11-25 — End: 2015-06-06

## 2015-01-13 MED ORDER — TRIAMTERENE-HCTZ 37.5-25 MG PO CAPS
37.5-25 | ORAL_CAPSULE | ORAL | 0 refills | Status: DC
Start: 2015-01-13 — End: 2015-04-13

## 2015-02-08 ENCOUNTER — Ambulatory Visit: Admit: 2015-02-08 | Discharge: 2015-02-08 | Payer: MEDICARE | Attending: Family Medicine | Primary: Family Medicine

## 2015-02-08 DIAGNOSIS — I10 Essential (primary) hypertension: Secondary | ICD-10-CM

## 2015-02-08 MED ORDER — POTASSIUM CHLORIDE CRYS ER 10 MEQ PO TBCR
10 MEQ | ORAL_TABLET | Freq: Every day | ORAL | 1 refills | Status: DC
Start: 2015-02-08 — End: 2019-05-21

## 2015-02-08 NOTE — Patient Instructions (Addendum)
Do see dr haberthier for the stomach scope and the colon check  Dr Fransisco Beau for the foot   [facility]  See me back in 5 months  Stay with the diet

## 2015-02-08 NOTE — Progress Notes (Signed)
Flu  Vaccine given per order. Consent form signed. See immunization record for documentation.

## 2015-02-08 NOTE — Progress Notes (Signed)
 Subjective:      Patient ID: Jodi Walls is a 74 y.o. female.    HPI Comments: Patient presents with:  Check-Up: pt requesting flu shot    She is overall well. Her daughter is still ill with the breast ca  We discussed the pneumonia vaccine. She has had 2 pneumovax in the last 4 years. We ill wait on the prevnar     She needs referral for another egd, derm, and foot doctor. Having problem with small toe deviation  She is seeing dr Mercer Stakes end of the month  Dr Gerhard Knuckles told her to decrease the k to one a day      Date of Birth:  1941-04-15    Date of Visit:  02/08/2015     -- Pcn (Penicillins) -- Swelling    --  Face and hands swelled   -- Zithromax (Azithromycin) -- Swelling   -- Codeine -- Nausea And Vomiting    Current Outpatient Prescriptions:  potassium chloride  (KLOR-CON  M) 10 MEQ extended release tablet, Take 1 tablet by mouth daily, Disp: 60 tablet, Rfl: 1  triamterene -hydrochlorothiazide (DYAZIDE) 37.5-25 MG per capsule, TAKE 1 CAPSULE BY MOUTH EVERY DAY, Disp: 90 capsule, Rfl: 0  gabapentin  (NEURONTIN ) 300 MG capsule, TAKE ONE CAPSULE BY MOUTH EVERY DAY WITH SUPPER, Disp: 90 capsule, Rfl: 1  omeprazole  (PRILOSEC) 20 MG capsule, TAKE ONE CAPSULE BY MOUTH EVERY DAY, Disp: 90 capsule, Rfl: 1  levothyroxine  (SYNTHROID ) 75 MCG tablet, TAKE 1 TABLET BY MOUTH EVERY DAY, Disp: 90 tablet, Rfl: 1  melatonin 3 MG TABS tablet, Take 3 mg by mouth daily, Disp: , Rfl:   aspirin  81 MG tablet, Take 81 mg by mouth daily., Disp: , Rfl:   ferrous sulfate  325 (65 FE) MG tablet, Take 1 tablet by mouth daily (with breakfast), Disp: 30 tablet, Rfl: 3    No current facility-administered medications for this visit.       ---------------------------               02/08/15                      1052         ---------------------------   BP:          118/70         Site:       Left Arm        Position:     Sitting        Cuff Size:  Medium Adult      Pulse:         60           Temp:   97.5 F (36.4 C)    TempSrc:      Oral          Weight: 217 lb (98.4 kg)    Height:   5' (1.524 m)     ---------------------------  Body mass index is 42.38 kg/(m^2).     Wt Readings from Last 3 Encounters:  02/08/15 : 217 lb (98.4 kg)  11/10/14 : 220 lb 6.4 oz (100 kg)  08/25/14 : 224 lb (101.6 kg)    BP Readings from Last 3 Encounters:  02/08/15 : 118/70  11/10/14 : 128/76  08/25/14 : 128/80        Review of Systems    Objective:   Physical Exam   Constitutional: She appears well-developed and well-nourished. No distress.   Cardiovascular: Normal rate, regular rhythm  and normal heart sounds.  Exam reveals no gallop and no friction rub.    No murmur heard.       Pulmonary/Chest: Effort normal and breath sounds normal. No accessory muscle usage. No tachypnea. No respiratory distress. She has no decreased breath sounds. She has no wheezes. She has no rhonchi. She has no rales.   Abdominal: Soft. Normal appearance and bowel sounds are normal. She exhibits no distension, no abdominal bruit and no mass. There is no hepatosplenomegaly. There is no tenderness. There is no guarding.   Lymphadenopathy:     She has no cervical adenopathy.        Right: No supraclavicular adenopathy present.        Left: No supraclavicular adenopathy present.   Neurological: She is alert.   Skin: Skin is warm, dry and intact. She is not diaphoretic. No cyanosis. No pallor. Nails show no clubbing.   Psychiatric: She has a normal mood and affect.       Assessment:      1. Essential hypertension, benign     2. Needs flu shot  INFLUENZA, HIGH DOSE, PF (FLUZONE, HIGH DOSE - 65+)     Seeing dr Ezra Holmes for the breast next month      Plan:      Do see dr haberthier for the stomach scope and the colon check  Dr Glenetta Lane for the foot   [facility]  See me back in 5 months  Stay with the diet

## 2015-02-09 ENCOUNTER — Encounter: Attending: Family Medicine | Primary: Family Medicine

## 2015-02-23 ENCOUNTER — Encounter

## 2015-02-23 ENCOUNTER — Ambulatory Visit: Admit: 2015-02-23 | Discharge: 2015-02-23 | Payer: MEDICARE | Attending: Nurse Practitioner | Primary: Family Medicine

## 2015-02-23 DIAGNOSIS — D500 Iron deficiency anemia secondary to blood loss (chronic): Secondary | ICD-10-CM

## 2015-02-23 NOTE — Progress Notes (Signed)
ONCOLOGY FOLLOW-UP:       Primary Oncologist: Zenia Resides M.D.    CHIEF COMPLAINT:      Chief Complaint   Patient presents with   ??? Follow-up     PROBLEM LIST:       1. Anemia  2. CRI  3. MGUS    CURRENT THERAPY:      Observation.  Was on oral iron but stopped 3 months ago per instruction of Dr.Kurt Leuenberger.     INTERVAL HISTORY:       Jodi Walls returns for follow up.  Overall she is doing well and has no new concerns or complaints. Denies any blood in stool that she can tell. She has not seen GI because she is not sure what is going on with this referral.     REVIEW OF SYSTEMS:       ?? Constitutional: Denies fever, sweats, weight loss     ?? Eyes: No visual changes or diplopia. No scleral icterus.  ?? ENT: No Headaches, hearing loss or vertigo. No mouth sores or sore throat.  ?? Cardiovascular: No chest pain, dyspnea on exertion, palpitations or loss of consciousness.   ?? Respiratory: No cough or wheezing, no sputum production. No hemoptysis.   ?? Gastrointestinal: No abdominal pain, appetite loss, blood in stools. No change in bowel habits.  ?? Genitourinary: No dysuria, trouble voiding, or hematuria.  ?? Musculoskeletal:  No generalized weakness. No joint complaints.  ?? Integumentary: No rash or pruritis.  ?? Neurological: No headache, diplopia. No change in gait, balance, or coordination. No paresthesias.  ?? Endocrine: No temperature intolerance. No excessive thirst, fluid intake, or urination.   ?? Hematologic/Lymphatic: No abnormal bruising or ecchymoses, blood clots or swollen lymph nodes.  ?? Allergic/Immunologic: No nasal congestion or hives.     PHYSICAL EXAM:       Visit Vitals   ??? BP 134/82 (Site: Left Arm, Position: Sitting, Cuff Size: Medium Adult)   ??? Pulse 68   ??? Temp 97.5 ??F (36.4 ??C) (Oral)   ??? Resp 14   ??? Ht 4' 11.84" (1.52 m)   ??? Wt 220 lb (99.8 kg)   ??? BMI 43.19 kg/m2       General appearance: alert and cooperative  Head: Normocephalic, without obvious abnormality, atraumatic  Neck: No  palpable lymphadenopathy in supraclavicular or cervical chains  Lungs: Clear to auscultation bilaterally, no audible rales, wheezes or crackles  Heart: Regular rate and rhythm, S1, S2 normal  Abdomen: Soft, non-tender; bowel sounds normal; no masses,  no organomegaly  Extremities: without cyanosis, clubbing, edema or asymmetry  Skin: No jaundice, purpura or petechiae    LABS:     CBC:   Lab Results   Component Value Date    WBC 6.7 02/23/2015    WBC 6.8 11/22/2014    NEUTROABS 3.4 12/20/2011    HGB 14.3 02/23/2015    MCV 93.5 02/23/2015    PLT 268 02/23/2015       BMP:   Lab Results   Component Value Date    NA 143 02/23/2015    K 4.1 02/23/2015    CO2 26 02/23/2015    BUN 19 02/23/2015    CREATININE 1.4 02/23/2015    MG 2.00 11/22/2014    TSH 3.45 11/10/2014    TSH 4.37 08/23/2011       HEPATIC:  Lab Results   Component Value Date    AST 38 02/23/2015    ALT 33 02/23/2015  ALKPHOS 78 02/23/2015    ALKPHOS 96 11/10/2014    PROT 7.6 02/23/2015    PROT 7.2 11/10/2014    BILITOT 0.8 02/23/2015       SPEP pending for today.     IMAGING:     No results found.    ASSESSMENT AND PLAN:       1. Anemia.  Her Hb has normalized on oral iron--we will continue her off oral iron since her anemia remains resolved after being off for 3 months. We will call Dr.Habetheir's office and see when she can get in for GI eval (possible scopes). The patient has never had a colonoscopy. Return in 8 months otherwise.     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     Return to clinic:  MD/FU in 8 months. CBC, CMP, SPEP    Patient was instructed to stop at our front desk to make their next appointment before leaving.    They will call in the interim with any questions/concerns/new symptoms.    This plan was discussed with the patient and they verbalized understanding and acceptance of the plan.     Dr.Kurt Allegra Lai was available for consultation for this visit.     Carles Collet, APRN

## 2015-02-23 NOTE — Telephone Encounter (Signed)
Spoke to [place] at Dr. Dellia Cloud office patient needs appt for EGD and colonoscopy for iron def anemia.  Office will call patient with appt date and time.  Records were faxed to [phone]

## 2015-03-01 LAB — ELECTROPHORESIS PROTEIN, SERUM
Albumin %: 61.1 %
Albumin: 4.34 gm/dL (ref 4.00–5.30)
Alpha 1 %: 2.5 %
Alpha 1: 0.18 gm/dL (ref 0.10–0.30)
Alpha 2 %: 12.2 %
Alpha 2: 0.87 gm/dL (ref 0.50–1.00)
Beta Percent: 11.6 %
Beta: 0.82 gm/dL (ref 0.60–1.10)
Gamma Globulin %: 12.6 %
Gamma: 0.89 gm/dL (ref 0.60–1.60)
Total Protein: 7.1 gm/dL (ref 6.4–8.2)

## 2015-03-01 LAB — COMPREHENSIVE METABOLIC PANEL
ALT: 33 U/L (ref 10–47)
AST: 38 U/L (ref 11–38)
Albumin: 4.1 G/DL (ref 3.3–5.5)
Alk Phosphatase: 78 U/L (ref 42–141)
BUN: 19 MG/DL (ref 7–22)
CO2: 26 mmol/L (ref 18–33)
Calcium: 10 MG/DL (ref 8.0–10.3)
Chloride: 96 mmol/L — ABNORMAL LOW (ref 98–108)
Creatinine: 1.4 MG/DL — ABNORMAL HIGH (ref 0.6–1.2)
GFR Non-African American: 39 mL/min/{1.73_m2} — ABNORMAL LOW (ref >60.0)
Glucose: 109 MG/DL (ref 73–118)
Potassium: 4.1 mmol/L (ref 3.6–5.1)
Sodium: 143 mmol/L (ref 128–145)
Total Bilirubin: 0.8 MG/DL (ref 0.2–1.6)
Total Protein: 7.6 G/DL (ref 6.4–8.1)
eGFR African American: 47.1 mL/min/{1.73_m2} — ABNORMAL LOW (ref >60.0)

## 2015-03-01 LAB — CBC WITH AUTO DIFFERENTIAL
Granulocytes %: 65.9 % (ref 37.0–80.0)
Granulocytes: 4.4 10*3/uL (ref 2.0–7.8)
Hematocrit: 46.8 % (ref 37.7–47.9)
Hemoglobin: 14.3 g/dL (ref 12.2–16.2)
Lymphocyte %: 27.4 % (ref 10.0–50.0)
Lymphocytes: 1.8 10*3/uL (ref 0.6–4.1)
MCH: 28.5 pg (ref 27.0–31.2)
MCHC: 30.6 g/dL — ABNORMAL LOW (ref 31.8–35.4)
MCV: 93.5 fL (ref 80.0–97.0)
Mid Cells %: 6.7 % (ref 0.1–24.0)
Mid Cells: 0.4 10*3/uL (ref 0.0–1.8)
Platelets: 268 10*3/uL (ref 142–424)
RBC: 5.01 M/uL (ref 4.04–5.48)
RDW: 13.3 % (ref 11.6–14.8)
WBC: 6.7 10*3/uL (ref 4.6–10.2)

## 2015-03-01 LAB — PROTEIN, TOTAL: Total Protein: 7.1 gm/dL (ref 6.4–8.2)

## 2015-04-08 NOTE — Progress Notes (Signed)
C-Difficile admission screening and protocol:     * Admitted with diarrhea?  no     *Prior history of C-Diff. In last 3 months?  no     *Antibiotic use in the past 6-8 weeks?  no     *Prior hospitalization or nursing home in the last month?    no

## 2015-04-08 NOTE — Patient Instructions (Signed)
[place] time____0900________        Surgery time__1030__________    Take the following medications with a sip of water:    Levothyroxine and omeprazole    Do not eat or drink anything after 12:00 midnight prior to your surgery.  This includes water chewing gum, mints and ice chips.   You may brush your teeth and gargle the morning of your surgery, but do not swallow the water     Please see your family doctor/pediatrician for a history and physical and/or concerning medications.   Bring any test results/reports from your physicians office.   If you are under the care of a heart doctor or specialist doctor, please be aware that you may be asked to them for clearance    You may be asked to stop blood thinners such as Coumadin, Plavix, Fragmin, Lovenox, etc., or any anti-inflammatories such as:  Aspirin, Ibuprofen, Advil, Naproxen prior to your surgery.    We also ask that you stop any OTC medications such as fish oil, vitamin E, glucosamine, garlic, Multivitamins, COQ 10, etc.    We ask that you do not smoke 24 hours prior to surgery  We ask that you do not  drink any alcoholic beverages 24 hours prior to surgery     You must make arrangements for a responsible adult to take you home after your surgery.    For your safety you will not be allowed to leave alone or drive yourself home.  Your surgery will be cancelled if you do not have a ride home.     Also for your safety, it is strongly suggested that someone stay with you the first 24 hours after your surgery.     A parent or legal guardian must accompany a child scheduled for surgery and plan to stay at the hospital until the child is discharged.    Please do not bring other children with you.    For your comfort, please wear simple loose fitting clothing to the hospital.  Please do not bring valuables.    Do not wear any make-up or nail polish on your fingers or toes      For your safety, please do not wear any  jewelry or body piercing's on the day of surgery.   All jewelry must be removed.      If you have dentures, they will be removed before going to operating room.    For your convenience, we will provide you with a container.    If you wear contact lenses or glasses, they will be removed, please bring a case for them.     If you have a living will and a durable power of attorney for healthcare, please bring in a copy.     As part of our patient safety program to minimize surgical site infections, we ask you to do the following:    ?? Please notify your surgeon if you develop any illness between         now and the  day of your surgery.    ?? This includes a cough, cold, fever, sore throat, nausea,         or vomiting, and diarrhea, etc.  ??  Please notify your surgeon if you experience dizziness, shortness         of breath or blurred vision between now and the time of your surgery.      Do not shave  your operative site 96 hours prior to surgery.   For face and neck surgery, men may use an electric razor 48 hours   prior to surgery.    You may shower the night before surgery or the morning of   your surgery with an antibacterial soap.    You will need to bring a photo ID and insurance card    [facility] has an onsite pharmacy, would you like to utilize our pharmacy     If you will be staying overnight and use a C-pap machine, please bring   your C-pap to hospital     Our goal is to provide you with excellent care, therefore, visitors will be limited to two(2) in the room at a time so that we may focus on providing this care for you.          Please contact pre-admission testing if you have any further questions.                 [facility] phone number:  [facility] fax number:  [phone]  Please note these are generalized instructions for all surgical cases, you may be provided with more specific instructions according to your surgery.

## 2015-04-12 ENCOUNTER — Inpatient Hospital Stay: Admit: 2015-04-12 | Attending: Gastroenterology | Primary: Family Medicine

## 2015-04-12 LAB — CBC WITH AUTO DIFFERENTIAL
Basophils %: 0.8 %
Basophils Absolute: 0 10*3/uL (ref 0.0–0.2)
Eosinophils %: 2.2 %
Eosinophils Absolute: 0.1 10*3/uL (ref 0.0–0.6)
Hematocrit: 38 % (ref 36.0–48.0)
Hemoglobin: 12.8 g/dL (ref 12.0–16.0)
Lymphocytes %: 22.5 %
Lymphocytes Absolute: 1.2 10*3/uL (ref 1.0–5.1)
MCH: 29.3 pg (ref 26.0–34.0)
MCHC: 33.6 g/dL (ref 31.0–36.0)
MCV: 87.1 fL (ref 80.0–100.0)
MPV: 8.8 fL (ref 5.0–10.5)
Monocytes %: 7.5 %
Monocytes Absolute: 0.4 10*3/uL (ref 0.0–1.3)
Neutrophils %: 67 %
Neutrophils Absolute: 3.5 10*3/uL (ref 1.7–7.7)
Platelets: 204 10*3/uL (ref 135–450)
RBC: 4.36 M/uL (ref 4.00–5.20)
RDW: 13.2 % (ref 12.4–15.4)
WBC: 5.2 10*3/uL (ref 4.0–11.0)

## 2015-04-12 LAB — FERRITIN: Ferritin: 78.8 ng/mL (ref 15.0–150.0)

## 2015-04-12 LAB — IRON AND TIBC
Iron Saturation: 29 % (ref 15–50)
Iron: 72 ug/dL (ref 37–145)
TIBC: 252 ug/dL — ABNORMAL LOW (ref 260–445)

## 2015-04-12 MED ORDER — ONDANSETRON HCL 4 MG/2ML IJ SOLN
4 MG/2ML | Freq: Once | INTRAMUSCULAR | Status: AC | PRN
Start: 2015-04-12 — End: 2015-04-12

## 2015-04-12 MED ORDER — NORMAL SALINE FLUSH 0.9 % IV SOLN
0.9 % | INTRAVENOUS | Status: DC | PRN
Start: 2015-04-12 — End: 2015-04-13

## 2015-04-12 MED ORDER — NORMAL SALINE FLUSH 0.9 % IV SOLN
0.9 % | Freq: Two times a day (BID) | INTRAVENOUS | Status: DC
Start: 2015-04-12 — End: 2015-04-13

## 2015-04-12 MED ORDER — FAMOTIDINE 20 MG/2ML IV SOLN
20 MG/2ML | Freq: Once | INTRAVENOUS | Status: AC
Start: 2015-04-12 — End: 2015-04-12
  Administered 2015-04-12: 15:00:00 20 mg via INTRAVENOUS

## 2015-04-12 MED ORDER — SODIUM CHLORIDE 0.9 % IV SOLN
0.9 % | INTRAVENOUS | Status: DC
Start: 2015-04-12 — End: 2015-04-13
  Administered 2015-04-12: 15:00:00 via INTRAVENOUS

## 2015-04-12 MED FILL — SODIUM CHLORIDE 0.9 % IV SOLN: 0.9 % | INTRAVENOUS | Qty: 1000

## 2015-04-12 MED FILL — FAMOTIDINE 20 MG/2ML IV SOLN: 20 MG/2ML | INTRAVENOUS | Qty: 2

## 2015-04-12 NOTE — Anesthesia Pre-Procedure Evaluation (Signed)
Department of Anesthesiology  Preprocedure Note       Name:  Jodi Walls   Age:  74 y.o.  DOB:  1940-05-13                                          MRN:  [id number]         Date:  04/12/2015        [facility] of Anest[facility]iology  Pre-Anesthesia Evaluation/Consultation       Name:  Jodi Walls                                         Age:  74 y.o.  MRN:  [id number]           Procedure (Scheduled):  Egd, colon  Surgeon:  Dr. Marsa Aris      Allergies   Allergen Reactions   ??? Pcn [Penicillins] Swelling     Face and hands swelled   ??? Ace Inhibitors Other (See Comments)     cough   ??? Atorvastatin Other (See Comments)     Muscle aches   ??? Zithromax [Azithromycin] Swelling   ??? Codeine Nausea And Vomiting     Patient Active Problem List   Diagnosis   ??? Other and unspecified hyperlipidemia   ??? Hypothyroidism   ??? Essential hypertension, benign   ??? HX: breast cancer   ??? Renal insufficiency   ??? Iron deficiency anemia due to chronic blood loss   ??? MGUS (monoclonal gammopathy of unknown significance)     Past Medical History   Diagnosis Date   ??? Anemia    ??? Arthritis    ??? Cancer ([place]) 1995     hx of breast cancer--no chemo or radiation needed   ??? GERD (gastroesophageal reflux disease)    ??? Hx of blood clots 2001     after gallbladder surgery--blood clot in arm where IV had been   ??? Hypothyroidism    ??? Kidney failure      stage 3   ??? PONV (postoperative nausea and vomiting)    ??? Wears glasses      reading     Past Surgical History   Procedure Laterality Date   ??? Appendectomy     ??? Hysterectomy       TAH-BSO. no  cancer   ??? Mastectomy, radical  1995   ??? Cholecystectomy  2001   ??? Joint replacement Left      left total knee replacement   ??? Back surgery       r/t to herniated disc in lower back, neck area--screws and rods in place   ??? Lipoma resection       right side below right breast   ??? Breast surgery  1995     right mastectomy   ??? Breast biopsy       left breast biopsy     Social History   Substance Use Topics   ???  Smoking status: Never Smoker   ??? Smokeless tobacco: Never Used   ??? Alcohol use Yes      Comment: social-rare     Medications  Current Outpatient Prescriptions on File Prior to Encounter   Medication Sig Dispense Refill   ??? potassium chloride (KLOR-CON M) 10 MEQ extended  release tablet Take 1 tablet by mouth daily 60 tablet 1   ??? triamterene-hydrochlorothiazide (DYAZIDE) 37.5-25 MG per capsule TAKE 1 CAPSULE BY MOUTH EVERY DAY 90 capsule 0   ??? gabapentin (NEURONTIN) 300 MG capsule TAKE ONE CAPSULE BY MOUTH EVERY DAY WITH SUPPER 90 capsule 1   ??? omeprazole (PRILOSEC) 20 MG capsule TAKE ONE CAPSULE BY MOUTH EVERY DAY 90 capsule 1   ??? levothyroxine (SYNTHROID) 75 MCG tablet TAKE 1 TABLET BY MOUTH EVERY DAY 90 tablet 1   ??? melatonin 3 MG TABS tablet Take 3 mg by mouth nightly      ??? aspirin 81 MG tablet Take 81 mg by mouth daily.       No current facility-administered medications on file prior to encounter.      Current Outpatient Prescriptions   Medication Sig Dispense Refill   ??? potassium chloride (KLOR-CON M) 10 MEQ extended release tablet Take 1 tablet by mouth daily 60 tablet 1   ??? triamterene-hydrochlorothiazide (DYAZIDE) 37.5-25 MG per capsule TAKE 1 CAPSULE BY MOUTH EVERY DAY 90 capsule 0   ??? gabapentin (NEURONTIN) 300 MG capsule TAKE ONE CAPSULE BY MOUTH EVERY DAY WITH SUPPER 90 capsule 1   ??? omeprazole (PRILOSEC) 20 MG capsule TAKE ONE CAPSULE BY MOUTH EVERY DAY 90 capsule 1   ??? levothyroxine (SYNTHROID) 75 MCG tablet TAKE 1 TABLET BY MOUTH EVERY DAY 90 tablet 1   ??? melatonin 3 MG TABS tablet Take 3 mg by mouth nightly      ??? aspirin 81 MG tablet Take 81 mg by mouth daily.       No current facility-administered medications for this encounter.      Vital Signs (Current) There were no vitals filed for this visit.  Vital Signs Statistics (for past 48 hrs)     No Data Recorded    BP Readings from Last 3 Encounters:   02/23/15 134/82   02/08/15 118/70   11/10/14 128/76     BMI  There is no height or weight on file  to calculate BMI.  Estimated body mass index is 40.62 kg/(m^2) as calculated from the following:    Height as of 04/08/15: 5\' 1"  (1.549 m).    Weight as of 04/08/15: 215 lb (97.5 kg).    CBC   Lab Results   Component Value Date    WBC 6.7 02/23/2015    WBC 6.8 11/22/2014    RBC 5.01 02/23/2015    RBC 4.83 11/22/2014    HGB 14.3 02/23/2015    HCT 46.8 02/23/2015    MCV 93.5 02/23/2015    RDW 13.3 02/23/2015    PLT 268 02/23/2015     CMP    Lab Results   Component Value Date    NA 143 02/23/2015    K 4.1 02/23/2015    CL 96 02/23/2015    CO2 26 02/23/2015    BUN 19 02/23/2015    CREATININE 1.4 02/23/2015    GFRAA 53 11/22/2014    GFRAA 48 10/11/2011    AGRATIO 1.6 11/10/2014    LABGLOM 39.0 02/23/2015    LABGLOM 47.2 08/15/2009    GLUCOSE 109 02/23/2015    GLUCOSE 100 11/22/2014    PROT 7.6 02/23/2015    PROT 7.1 02/23/2015    PROT 7.1 02/23/2015    CALCIUM 10.0 02/23/2015    BILITOT 0.8 02/23/2015    ALKPHOS 78 02/23/2015    ALKPHOS 96 11/10/2014    AST 38 02/23/2015  ALT 33 02/23/2015     BMP    Lab Results   Component Value Date    NA 143 02/23/2015    K 4.1 02/23/2015    CL 96 02/23/2015    CO2 26 02/23/2015    BUN 19 02/23/2015    CREATININE 1.4 02/23/2015    CALCIUM 10.0 02/23/2015    GFRAA 53 11/22/2014    GFRAA 48 10/11/2011    LABGLOM 39.0 02/23/2015    LABGLOM 47.2 08/15/2009    GLUCOSE 109 02/23/2015    GLUCOSE 100 11/22/2014     POCGlucose  No results for input(s): GLUCOSE in the last 72 hours.   Coags  No results found for: PROTIME, INR, APTT  HCG (If Applicable) No results found for: PREGTESTUR, PREGSERUM, HCG, HCGQUANT   ABGs No results found for: PHART, PO2ART, PCO2ART, HCO3ART, BEART, O2SATART   Type & Screen (If Applicable)  No results found for: LABABO, LABRH    NPO Status:                            clears 0500                                                      BMI:   Wt Readings from Last 3 Encounters:   04/08/15 215 lb (97.5 kg)   02/23/15 220 lb (99.8 kg)   02/08/15 217 lb (98.4 kg)     There  is no height or weight on file to calculate BMI.    Anesthesia Evaluation  Patient summary reviewed   history of anesthetic complications: PONV  Airway: Mallampati: II  TM distance: >3 FB   Neck ROM: full  Mouth opening: > = 3 FB Dental:    (+) upper dentures and lower dentures      Pulmonary: breath sounds clear to auscultation      (-) COPD, asthma, shortness of breath, recent URI and sleep apnea       Cardiovascular:    (+) hypertension:,     (-) valvular problems/murmurs, past MI, CAD, CABG/stent, dysrhythmias,  angina,  CHF, orthopnea, PND and murmur      Rhythm: regular  Rate: normal              Beta Blocker:  Not on Beta Blocker   Neuro/Psych:      (-) seizures, neuromuscular disease, TIA, CVA, headaches and psychiatric history  GI/Hepatic/Renal:   (+) GERD: well controlled, renal disease: CRI,      (-) PUD, hepatitis and liver disease     Endo/Other:    (+) hypothyroidism::.    (-) hyperthyroidism, blood dyscrasia       Comments: Right sided breast cancer, no BP/IV RUE Abdominal:                    Anesthesia Plan    ASA 3     TIVA     intravenous induction   Anesthetic plan and risks discussed with patient and child/children.    Plan discussed with CRNA.            DOS STAFF ADDENDUM:    Pt seen and examined, chart reviewed (including anesthesia, drug and allergy history).  No interval changes to history and physical examination.  Anesthetic plan, risks,  benefits, alternatives, and personnel involved discussed with patient.  Patient verbalized an understanding and agrees to proceed.      Stann Mainland, MD  April 12, 2015  9:11 AM      Stann Mainland, MD   04/12/2015

## 2015-04-12 NOTE — Progress Notes (Signed)
1.  Identify name and date of birth.  2.  Patient remains free from signs and symptoms of injury.  3.  Patient receives appropriate medication(s), safely administered during the       Perioperative period.  4.  The patient is free from signs and symptoms of infection.  5.  The patient has wound/tissur perfusion.  6.  The patient's fluid, electrolyte, and acid-base balances are established perioperatively.  7.  The patient's pulmonary function is established preoperatively.  8.  The patient's cardiovascular status is established perioperatively.  9.  The patient/caregiver demonstrates knowledge of nutritional management related to the operative or other invasive procedure.  10.  The patient/caregiver demonstrates knowledge of medication management.  11.  The patient/caregiver demonstrates knowledge of pain management.  12.  The patient participates in the rehabilitation process as applicable.  13.  The patient/caregiver participates in decisions in decisions affecting his or her Perioperative plan of care.  14.  The patients care is consistent with the individualized Perioperative plan of care.  15.  The patients right to privacy is maintained.  16.  The patient is the recipient of competent and ethical care within legal standards of practice.  17.  The patient's value system, lifestyle, ethnicity, and culture are considered, respected, and incorporated int the perioperative plan of care and understands special services available.  18.  The patient demonstrates and/or reports adequate pain control throughout the perioperative period.  19.  The patient's neurological status is established preoperatively.  20.  The patient/caregiver demonstrates knowledge of the expected responses to the operative or invasive procedure.  21.  Patient/caregiver has reduced anxiety.  Interventions - Familiarize with environment and equipment.  22.  Patient/caregiver verbalizes understanding of Phase I and Phase II process.  23.  Patient  pain level is established preoperatively using age appropriate pain scale.  Electronically signed by Jenne Campus, RN on 04/12/2015 at 9:30 AM

## 2015-04-12 NOTE — H&P (Signed)
Pre-operative History and Physical    Patient: Jodi Walls  DOB: 03-24-1941  Acct#:     Intended Procedure:  EGD, colonoscopy    HISTORY OF PRESENT ILLNESS:  The patient is a 74 y.o. female  who presents for/due to iron deficiency anemia.       Past Medical History:        Diagnosis Date   ??? Anemia    ??? Arthritis    ??? Cancer ([place]) 1995     hx of breast cancer--no chemo or radiation needed   ??? GERD (gastroesophageal reflux disease)    ??? Hx of blood clots 2001     after gallbladder surgery--blood clot in arm where IV had been   ??? Hypothyroidism    ??? Kidney failure      stage 3   ??? PONV (postoperative nausea and vomiting)    ??? Wears glasses      reading     Past Surgical History:        Procedure Laterality Date   ??? Appendectomy     ??? Hysterectomy       TAH-BSO. no  cancer   ??? Mastectomy, radical  1995   ??? Cholecystectomy  2001   ??? Joint replacement Left      left total knee replacement   ??? Back surgery       r/t to herniated disc in lower back, neck area--screws and rods in place   ??? Lipoma resection       right side below right breast   ??? Breast surgery  1995     right mastectomy   ??? Breast biopsy       left breast biopsy     Medications Prior to Admission:   Current Outpatient Prescriptions on File Prior to Encounter   Medication Sig Dispense Refill   ??? potassium chloride (KLOR-CON M) 10 MEQ extended release tablet Take 1 tablet by mouth daily 60 tablet 1   ??? triamterene-hydrochlorothiazide (DYAZIDE) 37.5-25 MG per capsule TAKE 1 CAPSULE BY MOUTH EVERY DAY 90 capsule 0   ??? gabapentin (NEURONTIN) 300 MG capsule TAKE ONE CAPSULE BY MOUTH EVERY DAY WITH SUPPER 90 capsule 1   ??? omeprazole (PRILOSEC) 20 MG capsule TAKE ONE CAPSULE BY MOUTH EVERY DAY 90 capsule 1   ??? levothyroxine (SYNTHROID) 75 MCG tablet TAKE 1 TABLET BY MOUTH EVERY DAY 90 tablet 1   ??? melatonin 3 MG TABS tablet Take 3 mg by mouth nightly      ??? aspirin 81 MG tablet Take 81 mg by mouth daily.       No current facility-administered medications on  file prior to encounter.         Allergies:  Pcn [penicillins]; Ace inhibitors; Atorvastatin; Zithromax [azithromycin]; and Codeine    Social History:   TOBACCO:   reports that she has never smoked. She has never used smokeless tobacco.  ETOH:   reports that she drinks alcohol.  DRUGS:   reports that she does not use illicit drugs.    PHYSICAL EXAM:      Vital Signs:   Visit Vitals   ??? BP (!) 146/78   ??? Pulse 92   ??? Temp 97.8 ??F (36.6 ??C) (Temporal)   ??? Resp 16   ??? Ht 5\' 1"  (1.549 m)   ??? Wt 217 lb 4.8 oz (98.6 kg)   ??? SpO2 97%   ??? BMI 41.06 kg/m2      Airway: No stridor or  wheezing noted.  Good air movement  Pulmonary: without wheezes.  Clear to auscultation  Cardiac:regular rate and rhythm without loud murmurs  Abdomen:soft, nontender,  Bowel sounds present    Pre-Procedure Assessment / Plan:  1) EGD, colonoscopy - iron deficiency anemia    ASA Grade:  ASA 2 - Patient with mild systemic disease with no functional limitations  Mallampati Classification:  Class II    Level of Sedation Plan: Administered by monitored anesthesia care.      Post Procedure plan: Return to same level of care    I assessed the patient and find that the patient is in satisfactory condition to proceed with the planned procedure and sedation plan.    I have explained the risk, benefits, and alternatives to the procedure; the patient understands and agrees to proceed.       Rockwell Germany, MD  04/12/2015

## 2015-04-12 NOTE — Progress Notes (Signed)
1.  Patient/Caregiver identifies-states name and date of birth  2.  The patient is free from signs & symptoms of chemical, electrical, laser, radiation, positioning or transfer/transport injury.  3.  The patient receives appropriate medication(s) safely administered during the Perioperative period.  4.  The patient has wound/tissue perfusion consistent with or improved from baseline levels established preoperatively  5. The patient is at or returning to normothermia at the conclusion of the immediate postoperative period.  6.  The patient's fluid, electrolyte, and acid base balances are consistent with or improved from baseline levels established preoperatively  7.  The patient's pulmonary function is consistent with or improved from baseline levels established preoperatively  8.  The patient's cardiovascular status is consistent with or improved from baseline levels established preoperatively  9.  The patient/caregiver demonstrates knowledge of nutritional management related to the operative or other invasive procedure.  10.  The patient/caregiver demonstrates knowledge of medication, pain, and wound management.  11.  The patient participates in the rehabilitation process as applicable  12.  The patient/caregiver participates in decisions affecting his or her Perioperative plan of care  13.  The patient's care is consistent with the individualized Perioperative plan of care  14.  The patient's right to privacy is maintained.  15.  The patient is the recipient of competent and ethical care within legal standards of practice.  16. The patient's value system, lifestyle, ethnicity, and culture are considered, respected, and incorporated in the Perioperative plan of care. Verbalizes understanding of special services available.  17.  The patient demonstrated and/or reports adequate pain control throughout the Perioperative period.  18.  The patient's neurological status is consistent with or improved from baseline levels  established preoperatively.  19.  The patient/caregiver demonstrates knowledge of the expected responses to the operative or invasive procedure.  20.  Patient/Caregiver has reduced anxiety. Interventions-Familiarize with environment and equipment.  21.  Other:  22.  Other:

## 2015-04-12 NOTE — Progress Notes (Signed)
oob to chair, tolerated po, daughter to bedside and d/c instructions reviewed

## 2015-04-12 NOTE — Progress Notes (Signed)
Arrived to phase 2, awake, alert, vss, abd soft, denies pain

## 2015-04-12 NOTE — Other (Signed)
Sedation provided per anesthesia.  See anesthesia record for medication administration and vitals.

## 2015-04-12 NOTE — Op Note (Addendum)
Endoscopy Note    Patient: Jodi Walls  DOB: 03-16-1941  Acct#:     Procedure: Esophagogastroduodenoscopy with biopsy                       Colonoscopy, diagnostic    Date:  04/12/2015     Endoscopist:   Rockwell Germany, MD    Referring Physician:  Barron Alvine, MD    Indications: This is a 74 y.o. year old female who presents today with Unexplained iron deficiency anaemia and screening for colon cancer.    Postoperative Diagnosis:    1) Large 6cm hiatal hernia.   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   3) Normal duodenum.  Cold forceps biopsies were performed to rule out celiac disease.   4) Mild sigmoid diverticulosis.     Anesthesia:  Administered by monitored anesthesia care.      Consent:  The patient or their legal guardian has signed a consent, and is aware of the potential risks, benefits, alternatives, and potential complications of this procedure.  These include, but are not limited to hemorrhage, bleeding, post procedural pain, perforation, phlebitis, aspiration, hypotension, hypoxia, cardiovascular events such as arryhthmia, and possibly death.  Additionally, the possibility of missed colonic polyps and interval colon cancer was discussed in the consent.    Description of Procedure: The patient was then taken to the endoscopy suite, placed in the left lateral decubitus position and the above IV sedation was administrered.    The Olympus video endoscope was placed through the patient's oropharynx without difficulty to the extent of the 2nd portion of the duodenum.  Both forward and retroflexed views of the stomach were obtained.      Findings:    Esophagus: The esophagus had a large 6cm hiatal hernia.  There were several small non-bleeding Cameron's erosions in the hiatal hernia sac, which may be the source of her recent iron deficiency anemia. There was no evidence of Barrett's esophagus or  reflux esophagitis.   Stomach: The stomach appeared normal on forward views.  Retroflexed views showed the large hiatal hernia.   Duodenum: The first and 2nd portions of the duodenum appeared normal with normal villous pattern.  Cold forceps biopsies were taken in the duodenum to rule out celiac disease.     The scope was then withdrawn back into the stomach, it was decompressed, and the scope was completely withdrawn.    A digital rectal examination was performed and revealed negative without mass, lesions or tenderness.      The Olympus video colonoscope was placed in the patient's rectum under digital direction and advanced to the cecum. The cecum was identified by characteristic anatomy and ballottment.  The prep was good.  The ileocecal valve was identified.     The terminal ileum was not intubated.    The scope was then withdrawn back through the cecum, ascending, transverse, descending, sigmoid colon, and rectum.  Careful circumferential examination of the mucosa in these areas demonstrated mild sigmoid diverticulosis. The scope was then withdrawn into the rectum and retroflexed.  The retroflexed view of the anal verge and rectum demonstrates no abnormalities.  The scope was straightened, the colon was decompressed and the scope was withdrawn from the patient.  The patient tolerated the procedure well and was taken to the PACU in good condition.    Impression:    1) Large 6cm  hiatal hernia.   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   3) Normal duodenum.  Cold forceps biopsies were performed to rule out celiac disease.   4) Mild sigmoid diverticulosis.     Recommendations:   1)  The patient had biopsies taken today.  The patient should call for results in 7 days if they have not heard from our office.  Our number is [phone].  2)  The patient should continue Prilosec (aka Omeprazole) 20mg  daily for findings of  large hiatal hernia with Cameron's erosions.  3)  Repeat colonoscopy in 10 years.   4)  Check labs today including: CBC with diff, iron, TIBC, and ferritin.     Rockwell Germany, [facility] [facility] [place]

## 2015-04-12 NOTE — Progress Notes (Signed)
1.  Patient is identified using name and date of birth.  2.  The patient is free from signs and symptoms of injury.  3.  The patient receives appropriate medication(s), safely administered during the perioperative period.  4.  The patient had wound/tissuue perfusion consistent with or improved from baseline levels established preoperatively.  5.  The patient is at or returning to normothermia at the conclusion of the immediate postoperative period.  6.  The patient's fluid, electrolyte, and acid base balances are consistent with or improved from baseline levels established preoperatively.  7.  The patient's pulmonary function is consistent with or improved from baseline levels established preoperatively.  8.  The patient's cardiovascular status is consistent with or improved from baseline levels established preoperatively.  9.  The patient/caregiver participates in decisions affecting his or her perioperative care.  10.  The patient's care is consistent with the individualized perioperative plan of care.  11.  The patient's right to privacy is maintained.  12.  The patient is the recipient of competent and ethical care within legal standards of practice.  13.  The patient's value system, lifestyle, ethnicity, and culture are considered, respected, and incorporated in the perioperative plan of care.  14.  The patient demonstrates and/or reports adequate pain control throughout the perioperative period.  15.  The patient's neurological status is consistent with or improved from baseline levels established preoperatively.  16.  The patient/caregiver demonstrates knowledge of the expected responses to the operative or invasive procedure.  17.  Patient/caregiver has reduced anxiety.  Interventions - familiarize with environment and equipment.

## 2015-04-12 NOTE — Anesthesia Post-Procedure Evaluation (Signed)
Anesthesia Post-op Note    Patient: Jodi Walls  MRN: [id number]  Birthdate: Sep 29, 1940  Date of evaluation: 04/12/2015  Time:  11:59 AM         [facility]  Post-Anesthesia Note       Name:  ANTONYA CARMER                                  Age:  74 y.o.  MRN:  [id number]     Last Vitals & Oxygen Saturation:   Visit Vitals   ??? BP (!) 154/89   ??? Pulse 67   ??? Temp 97.4 ??F (36.3 ??C) (Temporal)   ??? Resp 16   ??? Ht 5\' 1"  (1.549 m)   ??? Wt 217 lb 4.8 oz (98.6 kg)   ??? SpO2 99%   ??? BMI 41.06 kg/m2     Patient Vitals for the past 4 hrs:   BP Temp Temp src Pulse Resp SpO2 Height Weight   04/12/15 1147 (!) 154/89 97.4 ??F (36.3 ??C) Temporal 67 16 99 % - -   04/12/15 1128 (!) 142/84 97.3 ??F (36.3 ??C) Temporal 72 16 98 % - -   04/12/15 1115 - 97 ??F (36.1 ??C) - 67 16 98 % - -   04/12/15 1114 - - - 71 13 100 % - -   04/12/15 1113 - - - 74 16 100 % - -   04/12/15 1112 123/69 - - 67 14 100 % - -   04/12/15 1111 - - - 71 14 100 % - -   04/12/15 1110 - - - 71 11 99 % - -   04/12/15 1108 - - - 74 15 - - -   04/12/15 1107 113/68 - - 76 13 100 % - -   04/12/15 1106 - - - 75 13 100 % - -   04/12/15 1105 - - - 73 14 100 % - -   04/12/15 1104 - - - 75 14 100 % - -   04/12/15 1103 - - - 79 21 100 % - -   04/12/15 1102 109/64 - - 71 14 - - -   04/12/15 1059 - - - 80 13 99 % - -   04/12/15 1058 (!) 103/51 96.8 ??F (36 ??C) Temporal 74 13 97 % - -   04/12/15 1057 - - - 76 13 100 % - -   04/12/15 1056 110/66 - - - - 98 % - -   04/12/15 1055 - - - - - 100 % - -   04/12/15 1054 - - - 74 15 98 % - -   04/12/15 0934 (!) 146/78 97.8 ??F (36.6 ??C) Temporal 92 16 97 % 5\' 1"  (1.549 m) 217 lb 4.8 oz (98.6 kg)       Level of consciousness:  Awake, alert    Respiratory: Respirations easy, no distress. Stable.    Cardiovascular: Hemodynamically stable.    Hydration: Adequate.    PONV: Adequately managed.    Post-op pain: Adequately controlled.    Post-op assessment: Tolerated anesthetic well without complication.    Complications:   None.    Sonia Baller, MD  April 12, 2015   11:59 AM      Anesthesia Post Evaluation    Sonia Baller, MD  11:59 AM

## 2015-04-12 NOTE — Progress Notes (Signed)
1052--Pt admitted to PACU from endo. Moniotrs placed. O2 ona t 3l./nc. Abd soft.  IV patent .

## 2015-04-12 NOTE — Progress Notes (Signed)
Awake and oriented. Abd soft.  To phase 2 permission Dr. Berneice Gandy anesthesia.

## 2015-04-12 NOTE — Progress Notes (Signed)
Blood drawn jper order before leaving PACU>

## 2015-04-13 MED ORDER — TRIAMTERENE-HCTZ 37.5-25 MG PO CAPS
37.5-25 | ORAL_CAPSULE | Freq: Every day | ORAL | 0 refills | Status: DC
Start: 2015-04-13 — End: 2015-07-16

## 2015-04-14 MED ORDER — LEVOTHYROXINE SODIUM 75 MCG PO TABS
75 MCG | ORAL_TABLET | Freq: Every day | ORAL | 1 refills | Status: DC
Start: 2015-04-14 — End: 2015-07-18

## 2015-05-06 MED ORDER — OMEPRAZOLE 20 MG PO CPDR
20 MG | ORAL_CAPSULE | ORAL | 0 refills | Status: DC
Start: 2015-05-06 — End: 2015-08-03

## 2015-06-06 MED ORDER — GABAPENTIN 300 MG PO CAPS
300 MG | ORAL_CAPSULE | ORAL | 1 refills | Status: DC
Start: 2015-06-06 — End: 2015-11-29

## 2015-07-13 ENCOUNTER — Encounter

## 2015-07-13 ENCOUNTER — Ambulatory Visit: Admit: 2015-07-13 | Discharge: 2015-07-13 | Payer: MEDICARE | Attending: Family Medicine | Primary: Family Medicine

## 2015-07-13 DIAGNOSIS — I10 Essential (primary) hypertension: Secondary | ICD-10-CM

## 2015-07-13 MED ORDER — ALPRAZOLAM 0.25 MG PO TABS
0.25 MG | ORAL_TABLET | Freq: Every day | ORAL | 0 refills | Status: DC | PRN
Start: 2015-07-13 — End: 2016-08-27

## 2015-07-13 NOTE — Patient Instructions (Addendum)
Take care of the xanax as it can cause sleepiness and at times forgetfulness  See [name] in 6 months    Self- Management Goals for the Patient with Hypertension:    It is important to have goals to work towards when you have Hypertension.    Below is a list of goals your doctor would like you to work towards to help control your hypertension and also maintain and improve your overall health.    Please select one of these goals to try before your next follow up visit:    Goal: I will take all medications as prescribed by my doctor, and I will call the office if I am having any medication problems.    Guess your barriers before they happen. Everyone runs into barriers to their goals. You may already know what's going to get in your way. Write down these problems (cost? time? stress? fear?), and think of ways to get around them.     Barriers to success: none  Plan for overcoming my barriers: N/A     Confidence: 10/10  Date goal set: 07/13/15  Date goal attained:             Learning About High Blood Pressure  What is high blood pressure?    Blood pressure is a measure of how hard the blood pushes against the walls of your arteries. It's normal for blood pressure to go up and down throughout the day, but if it stays up, you have high blood pressure. Another name for high blood pressure is hypertension.  Two numbers tell you your blood pressure. The first number is the systolic pressure. It shows how hard the blood pushes when your heart is pumping. The second number is the diastolic pressure. It shows how hard the blood pushes between heartbeats, when your heart is relaxed and filling with blood.  A blood pressure of less than 120/80 (say "120 over 80") is ideal for an adult. High blood pressure is 140/90 or higher. You have high blood pressure if your top number is 140 or higher or your bottom number is 90 or higher, or both. Many people fall into the category in between, called prehypertension. People with  prehypertension need to make lifestyle changes to bring their blood pressure down and help prevent or delay high blood pressure.  What happens when you have high blood pressure?  ?? Blood flows through your arteries with too much force. Over time, this damages the walls of your arteries. But you can't feel it. High blood pressure usually doesn't cause symptoms.  ?? Fat and calcium start to build up in your arteries. This buildup is called plaque. Plaque makes your arteries narrower and stiffer. Blood can't flow through them as easily.  ?? This lack of good blood flow starts to damage some of the organs in your body. This can lead to problems such as coronary artery disease and heart attack, heart failure, stroke, kidney failure, and eye damage.  How can you prevent high blood pressure?  ?? Stay at a healthy weight.  ?? Try to limit how much sodium you eat to less than 2,300 milligrams (mg) a day. If you limit your sodium to 1,500 mg a day, you can lower your blood pressure even more.  ?? Buy foods that are labeled "unsalted," "sodium-free," or "low-sodium." Foods labeled "reduced-sodium" and "light sodium" may still have too much sodium.  ?? Flavor your food with garlic, lemon juice, onion, vinegar, herbs, and spices instead  of salt. Do not use soy sauce, steak sauce, onion salt, garlic salt, mustard, or ketchup on your food.  ?? Use less salt (or none) when recipes call for it. You can often use half the salt a recipe calls for without losing flavor.  ?? Be physically active. Get at least 30 minutes of exercise on most days of the week. Walking is a good choice. You also may want to do other activities, such as running, swimming, cycling, or playing tennis or team sports.  ?? Limit alcohol to 2 drinks a day for men and 1 drink a day for women.  ?? Eat plenty of fruits, vegetables, and low-fat dairy products. Eat less saturated and total fats.  How is high blood pressure treated?  ?? Your doctor will suggest making lifestyle  changes. For example, your doctor may ask you to eat healthy foods, quit smoking, lose extra weight, and be more active.  ?? If lifestyle changes don't help enough or your blood pressure is very high, you will have to take medicine every day.  Follow-up care is a key part of your treatment and safety. Be sure to make and go to all appointments, and call your doctor if you are having problems. It's also a good idea to know your test results and keep a list of the medicines you take.  Where can you learn more?  Go to [email or web address] and sign in to your MyChart account. Enter P501 in the [place] box to learn more about "Learning About High Blood Pressure."     If you do not have an account, please click on the "Sign Up Now" link.  Current as of: July 21, 2014  Content Version: 11.2  ?? 2006-2017 [employer], [employer]. Care instructions adapted under license by [facility]. If you have questions about a medical condition or this instruction, always ask your healthcare professional. [place] any warranty or liability for your use of this information.       DASH Diet: Care Instructions  Your Care Instructions  The DASH diet is an eating plan that can help lower your blood pressure. DASH stands for Dietary Approaches to Stop Hypertension. Hypertension is high blood pressure.  The DASH diet focuses on eating foods that are high in calcium, potassium, and magnesium. These nutrients can lower blood pressure. The foods that are highest in these nutrients are fruits, vegetables, low-fat dairy products, nuts, seeds, and legumes. But taking calcium, potassium, and magnesium supplements instead of eating foods that are high in those nutrients does not have the same effect. The DASH diet also includes whole grains, fish, and poultry.  The DASH diet is one of several lifestyle changes your doctor may recommend to lower your high blood pressure. Your doctor may  also want you to decrease the amount of sodium in your diet. Lowering sodium while following the DASH diet can lower blood pressure even further than just the DASH diet alone.  Follow-up care is a key part of your treatment and safety. Be sure to make and go to all appointments, and call your doctor if you are having problems. It's also a good idea to know your test results and keep a list of the medicines you take.  How can you care for yourself at home?  Following the DASH diet  ?? Eat 4 to 5 servings of fruit each day. A serving is 1 medium-sized piece of fruit, ?? cup chopped or canned fruit, 1/4 cup dried fruit, or  4 ounces (?? cup) of fruit juice. Choose fruit more often than fruit juice.  ?? Eat 4 to 5 servings of vegetables each day. A serving is 1 cup of lettuce or raw leafy vegetables, ?? cup of chopped or cooked vegetables, or 4 ounces (?? cup) of vegetable juice. Choose vegetables more often than vegetable juice.  ?? Get 2 to 3 servings of low-fat and fat-free dairy each day. A serving is 8 ounces of milk, 1 cup of yogurt, or 1 ?? ounces of cheese.  ?? Eat 6 to 8 servings of grains each day. A serving is 1 slice of bread, 1 ounce of dry cereal, or ?? cup of cooked rice, pasta, or cooked cereal. Try to choose whole-grain products as much as possible.  ?? Limit lean meat, poultry, and fish to 2 servings each day. A serving is 3 ounces, about the size of a deck of cards.  ?? Eat 4 to 5 servings of nuts, seeds, and legumes (cooked dried beans, lentils, and split peas) each week. A serving is 1/3 cup of nuts, 2 tablespoons of seeds, or ?? cup of cooked beans or peas.  ?? Limit fats and oils to 2 to 3 servings each day. A serving is 1 teaspoon of vegetable oil or 2 tablespoons of salad dressing.  ?? Limit sweets and added sugars to 5 servings or less a week. A serving is 1 tablespoon jelly or jam, ?? cup sorbet, or 1 cup of lemonade.  ?? Eat less than 2,300 milligrams (mg) of sodium a day. If you limit your sodium to 1,500 mg  a day, you can lower your blood pressure even more.  Tips for success  ?? Start small. Do not try to make dramatic changes to your diet all at once. You might feel that you are missing out on your favorite foods and then be more likely to not follow the plan. Make small changes, and stick with them. Once those changes become habit, add a few more changes.  ?? Try some of the following:  ?? Make it a goal to eat a fruit or vegetable at every meal and at snacks. This will make it easy to get the recommended amount of fruits and vegetables each day.  ?? Try yogurt topped with fruit and nuts for a snack or healthy dessert.  ?? Add lettuce, tomato, cucumber, and onion to sandwiches.  ?? Combine a ready-made pizza crust with low-fat mozzarella cheese and lots of vegetable toppings. Try using tomatoes, squash, spinach, broccoli, carrots, cauliflower, and onions.  ?? Have a variety of cut-up vegetables with a low-fat dip as an appetizer instead of chips and dip.  ?? Sprinkle sunflower seeds or chopped almonds over salads. Or try adding chopped walnuts or almonds to cooked vegetables.  ?? Try some vegetarian meals using beans and peas. Add garbanzo or kidney beans to salads. Make burritos and tacos with mashed pinto beans or black beans.  Where can you learn more?  Go to [email or web address] and sign in to your MyChart account. Enter [phone] in the [place] box to learn more about "DASH Diet: Care Instructions."     If you do not have an account, please click on the "Sign Up Now" link.  Current as of: July 21, 2014  Content Version: 11.2  ?? 2006-2017 Healthwise, Inc[employer]. Care instructions adapted under license by [facility]. If you have questions about a medical condition or this instruction, always ask your healthcare professional. [employer] disclaims  any warranty or liability for your use of this information.

## 2015-07-13 NOTE — Progress Notes (Signed)
 Subjective:      Patient ID: Jodi Walls is a 75 y.o. female.    HPI Comments: Patient presents with:  Follow-up: hypertension, thyroid, lipids - pt is fasting    She is ;doing fine  She did see dr Ezra Holmes last week for breast check  She also had egd and colon in December    She wanted a refill of the xanax  and she uses for anxiety situations. Will be on plane trip  She  Has not used for some time    Date of Birth:  29-Sep-1940    Date of Visit:  07/13/2015     -- Pcn (Penicillins) -- Swelling    --  Face and hands swelled   -- Ace Inhibitors -- Other (See Comments)    --  cough   -- Atorvastatin -- Other (See Comments)    --  Muscle aches   -- Zithromax (Azithromycin) -- Swelling   -- Codeine -- Nausea And Vomiting    Current Outpatient Prescriptions:  gabapentin  (NEURONTIN ) 300 MG capsule, TAKE ONE CAPSULE BY MOUTH EVERY DAY WITH SUPPER, Disp: 90 capsule, Rfl: 1  omeprazole  (PRILOSEC) 20 MG delayed release capsule, TAKE ONE CAPSULE BY MOUTH EVERY DAY, Disp: 90 capsule, Rfl: 0  levothyroxine  (SYNTHROID ) 75 MCG tablet, Take 1 tablet by mouth daily, Disp: 90 tablet, Rfl: 1  triamterene -hydrochlorothiazide (DYAZIDE) 37.5-25 MG per capsule, Take 1 capsule by mouth daily, Disp: 90 capsule, Rfl: 0  potassium chloride  (KLOR-CON  M) 10 MEQ extended release tablet, Take 1 tablet by mouth daily, Disp: 60 tablet, Rfl: 1  melatonin 3 MG TABS tablet, Take 3 mg by mouth nightly , Disp: , Rfl:   aspirin  81 MG tablet, Take 81 mg by mouth daily., Disp: , Rfl:     No current facility-administered medications for this visit.       ---------------------------               07/13/15                      0948         ---------------------------   BP:          130/74         Site:       Left Arm        Position:     Sitting        Cuff Size:   Large Adult      Pulse:         72           Temp:   97.7 F (36.5 C)   TempSrc:      Oral          Weight:  216 lb (98 kg)     Height:  5' 1 (1.549 m)    ---------------------------  Body mass index is 40.81 kg/(m^2).     Wt Readings from Last 3 Encounters:  07/13/15 : 216 lb (98 kg)  04/12/15 : 217 lb 4.8 oz (98.6 kg)  04/08/15 : 215 lb (97.5 kg)    BP Readings from Last 3 Encounters:  07/13/15 : 130/74  04/12/15 : (!) 154/89  02/23/15 : 134/82                Review of Systems   Constitutional: Negative for appetite change, chills, fever and unexpected weight change.   Respiratory: Negative for cough, chest tightness and shortness of breath.  Cardiovascular: Negative for chest pain, palpitations and leg swelling.   Gastrointestinal: Negative for abdominal distention, abdominal pain, blood in stool, constipation, diarrhea, nausea and vomiting.   Genitourinary: Negative for difficulty urinating, dysuria, frequency and hematuria.   Neurological: Negative for dizziness and headaches.       Objective:   Physical Exam   Constitutional: She appears well-developed and well-nourished. No distress.   Cardiovascular: Normal rate, regular rhythm and normal heart sounds.  Exam reveals no gallop and no friction rub.    No murmur heard.  Pulmonary/Chest: Effort normal and breath sounds normal. No accessory muscle usage. No tachypnea. No respiratory distress. She has no decreased breath sounds. She has no wheezes. She has no rhonchi. She has no rales.   Lymphadenopathy:     She has no cervical adenopathy.        Right: No supraclavicular adenopathy present.        Left: No supraclavicular adenopathy present.   Neurological: She is alert.   Skin: Skin is warm, dry and intact. She is not diaphoretic. No pallor.       Assessment:       1. Essential hypertension, benign  Comprehensive Metabolic Panel    Lipid Panel   2. Acquired hypothyroidism  Comprehensive Metabolic Panel    Lipid Panel   3. Need for prophylactic vaccination with Streptococcus pneumoniae (Pneumococcus) and Influenza vaccines  Pneumococcal conjugate vaccine 13-valent IM (PREVNAR 13)     Controlled Substances  Monitoring:    Done today and negative  Gabapentin  used for her leg cramps      Plan:      Take care of the xanax  as it can cause sleepiness and at times forgetfulness  See us  in 6 months  Jodi Walls received counseling on the following healthy behaviors: nutrition, exercise and medication adherence    Patient given educational materials on Nutrition and Hypertension    I have instructed Jodi Walls to complete a self tracking handout on Blood Pressures  and instructed them to bring it with them to her next appointment.     Discussed use, benefit, and side effects of prescribed medications.  Barriers to medication compliance addressed.  All patient questions answered.  Pt voiced understanding.         Orders Placed This Encounter   Medications   . ALPRAZolam  (XANAX ) 0.25 MG tablet     Sig: Take 1 tablet by mouth daily as needed for Anxiety     Dispense:  20 tablet     Refill:  0

## 2015-07-13 NOTE — Progress Notes (Signed)
Sodium (mmol/L)   Date Value   02/23/2015 143    BUN (MG/DL)   Date Value   02/23/2015 19    Glucose   Date Value   02/23/2015 109 MG/DL   11/22/2014 100 mg/dL (H)      Potassium (mmol/L)   Date Value   02/23/2015 4.1    CREATININE (MG/DL)   Date Value   02/23/2015 1.4 (H)           BP Readings from Last 2 Encounters:   04/12/15 (!) 154/89   02/23/15 134/82       Is patient currently taking any antihypertensive medications?     Yes   If yes, see med list as above    Is the patient reporting any side effects of antihypertensive medications?   No    Is the patient taking any over the counter medications?    Yes   If yes, see med list as above    Is the patient taking a daily aspirin?    Yes

## 2015-07-14 LAB — COMPREHENSIVE METABOLIC PANEL
ALT: 21 U/L (ref 10–40)
AST: 24 U/L (ref 15–37)
Albumin/Globulin Ratio: 1.4 (ref 1.1–2.2)
Albumin: 4.2 g/dL (ref 3.4–5.0)
Alkaline Phosphatase: 90 U/L (ref 40–129)
Anion Gap: 16 (ref 3–16)
BUN: 25 mg/dL — ABNORMAL HIGH (ref 7–20)
CO2: 25 mmol/L (ref 21–32)
Calcium: 9.8 mg/dL (ref 8.3–10.6)
Chloride: 98 mmol/L — ABNORMAL LOW (ref 99–110)
Creatinine: 1.2 mg/dL (ref 0.6–1.2)
GFR African American: 53 — AB (ref >60)
GFR Non-African American: 44 — AB (ref >60)
Globulin: 3 g/dL
Glucose: 113 mg/dL — ABNORMAL HIGH (ref 70–99)
Potassium: 3.9 mmol/L (ref 3.5–5.1)
Sodium: 139 mmol/L (ref 136–145)
Total Bilirubin: 0.6 mg/dL (ref 0.0–1.0)
Total Protein: 7.2 g/dL (ref 6.4–8.2)

## 2015-07-14 LAB — LIPID PANEL
Cholesterol, Total: 224 mg/dL — ABNORMAL HIGH (ref 0–199)
HDL: 82 mg/dL — ABNORMAL HIGH (ref 40–60)
LDL Calculated: 125 mg/dL — ABNORMAL HIGH (ref <100)
Triglycerides: 87 mg/dL (ref 0–150)
VLDL Cholesterol Calculated: 17 mg/dL

## 2015-07-18 MED ORDER — TRIAMTERENE-HCTZ 37.5-25 MG PO CAPS
37.5-25 | ORAL_CAPSULE | ORAL | 1 refills | Status: DC
Start: 2015-07-18 — End: 2016-02-03

## 2015-07-18 MED ORDER — LEVOTHYROXINE SODIUM 75 MCG PO TABS
75 MCG | ORAL_TABLET | ORAL | 1 refills | Status: DC
Start: 2015-07-18 — End: 2016-07-19

## 2015-08-03 MED ORDER — OMEPRAZOLE 20 MG PO CPDR
20 MG | ORAL_CAPSULE | ORAL | 1 refills | Status: DC
Start: 2015-08-03 — End: 2016-01-27

## 2015-10-26 ENCOUNTER — Ambulatory Visit
Admit: 2015-10-26 | Discharge: 2015-10-26 | Payer: MEDICARE | Attending: Hematology & Oncology | Primary: Family Medicine

## 2015-10-26 DIAGNOSIS — D500 Iron deficiency anemia secondary to blood loss (chronic): Secondary | ICD-10-CM

## 2015-10-26 NOTE — Progress Notes (Signed)
ONCOLOGY FOLLOW-UP:       Primary Oncologist: Zenia Resides M.D.    PROBLEM LIST:       1. Anemia  2. CRI  3. MGUS    INTERVAL HISTORY:       Jodi Walls returns for follow up.  Overall she is doing well--denies sob/cp/abd pain    REVIEW OF SYSTEMS:         ?? Constitutional: Denies fever, sweats, weight loss     ?? Eyes: No visual changes or diplopia. No scleral icterus.  ?? ENT: No Headaches, hearing loss or vertigo. No mouth sores or sore throat.  ?? Cardiovascular: No chest pain, dyspnea on exertion, palpitations or loss of consciousness.   ?? Respiratory: No cough or wheezing, no sputum production. No hemoptysis..    ?? Gastrointestinal: No abdominal pain, appetite loss, blood in stools. No change in bowel habits.  ?? Genitourinary: No dysuria, trouble voiding, or hematuria.  ?? Musculoskeletal:  Generalized weakness. No joint complaints.  ?? Integumentary: No rash or pruritis.  ?? Neurological: No headache, diplopia. No change in gait, balance, or coordination. No paresthesias.  ?? Endocrine: No temperature intolerance. No excessive thirst, fluid intake, or urination.   ?? Hematologic/Lymphatic: No abnormal bruising or ecchymoses, blood clots or swollen lymph nodes.  ?? Allergic/Immunologic: No nasal congestion or hives.           PHYSICAL EXAM:       BP 138/82 (Site: Left Arm, Position: Sitting, Cuff Size: Medium Adult)   Pulse 54   Temp 96.9 ??F (36.1 ??C) (Oral)    Resp 20   Ht 5' 1.02" (1.55 m)   Wt 213 lb (96.6 kg)   BMI 40.21 kg/m2    General appearance: alert and cooperative  Head: Normocephalic, without obvious abnormality, atraumatic  Neck: No palpable lymphadenopathy in supraclavicular or cervical chains  Lungs: Clear to auscultation bilaterally, no audible rales, wheezes or crackles  Heart: Regular rate and rhythm, S1, S2 normal  Abdomen: Soft, non-tender; bowel sounds normal; no masses,  no organomegaly  Extremities: without cyanosis, clubbing, edema or asymmetry  Skin: No jaundice, purpura or  petechiae        LABS:     CBC:   Lab Results   Component Value Date    WBC 6.0 10/26/2015    WBC 5.2 04/12/2015    NEUTROABS 3.5 04/12/2015    HGB 14.0 10/26/2015    MCV 90.8 10/26/2015    PLT 261 10/26/2015       BMP:   Lab Results   Component Value Date    NA 140 10/26/2015    K 3.3 10/26/2015    CO2 30 10/26/2015    BUN 27 10/26/2015    CREATININE 1.4 10/26/2015    MG 2.00 11/22/2014    TSH 3.45 11/10/2014       HEPATIC:  Lab Results   Component Value Date    AST 32 10/26/2015    ALT 25 10/26/2015    ALKPHOS 89 10/26/2015    ALKPHOS 90 07/13/2015    PROT 7.3 10/26/2015    PROT 7.1 10/26/2015    BILITOT 0.7 10/26/2015       TUMOR MARKERS: No results found for: PSA, CEA, CA125, CA2729, CA199      IMAGING:     @SEHRISRSLT @    ASSESSMENT AND PLAN:       1. Anemia.  She is not anemic today.    2. MGUS.  I will recheck her M  spike today.  Assuming it is stable, I will see her on a yearly basis.  If it ever starts to go up, I will perform a bone marrow aspirate and biopsy.  She will return in 1 year.      Zenia Resides, MD  [phone]

## 2015-10-28 LAB — CBC WITH AUTO DIFFERENTIAL
Granulocytes %: 68.2 % (ref 37.0–80.0)
Granulocytes: 4.1 10*3/uL (ref 2.0–7.8)
Hematocrit: 42.9 % (ref 37.7–47.9)
Hemoglobin: 14 g/dL (ref 12.2–16.2)
Lymphocyte %: 24.7 % (ref 10.0–50.0)
Lymphocytes: 1.5 10*3/uL (ref 0.6–4.1)
MCH: 29.7 pg (ref 27.0–31.2)
MCHC: 32.6 g/dL (ref 31.8–35.4)
MCV: 90.8 fL (ref 80.0–97.0)
Mid Cells %: 7.1 % (ref 0.1–24.0)
Mid Cells: 0.4 10*3/uL (ref 0.0–1.8)
Platelets: 261 10*3/uL (ref 142–424)
RBC: 4.72 M/uL (ref 4.04–5.48)
RDW: 13.5 % (ref 11.6–14.8)
WBC: 6 10*3/uL (ref 4.6–10.2)

## 2015-10-28 LAB — COMPREHENSIVE METABOLIC PANEL
ALT: 25 U/L (ref 10–47)
AST: 32 U/L (ref 11–38)
Albumin: 3.9 G/DL (ref 3.3–5.5)
Alk Phosphatase: 89 U/L (ref 42–141)
BUN: 27 MG/DL — ABNORMAL HIGH (ref 7–22)
CO2: 30 mmol/L (ref 18–33)
Calcium: 9.6 MG/DL (ref 8.0–10.3)
Chloride: 99 mmol/L (ref 98–108)
Creatinine: 1.4 MG/DL — ABNORMAL HIGH (ref 0.6–1.2)
GFR Non-African American: 38.9 mL/min/{1.73_m2} — ABNORMAL LOW (ref >60.0)
Glucose: 109 MG/DL (ref 73–118)
Potassium: 3.3 mmol/L — ABNORMAL LOW (ref 3.6–5.1)
Sodium: 140 mmol/L (ref 128–145)
Total Bilirubin: 0.7 MG/DL (ref 0.2–1.6)
Total Protein: 7.3 G/DL (ref 6.4–8.1)
eGFR African American: 47 mL/min/{1.73_m2} — ABNORMAL LOW (ref >60.0)

## 2015-10-28 LAB — ELECTROPHORESIS PROTEIN, SERUM
Albumin %: 62.6 %
Albumin: 4.44 gm/dL (ref 4.00–5.30)
Alpha 1 %: 2.5 %
Alpha 1: 0.18 gm/dL (ref 0.10–0.30)
Alpha 2 %: 12.4 %
Alpha 2: 0.88 gm/dL (ref 0.50–1.00)
Beta Percent: 11.2 %
Beta: 0.8 gm/dL (ref 0.60–1.10)
Gamma Globulin %: 11.3 %
Gamma: 0.8 gm/dL (ref 0.60–1.60)
Total Protein: 7.1 gm/dL (ref 6.4–8.2)

## 2015-10-28 LAB — PROTEIN, TOTAL: Total Protein: 7.1 gm/dL (ref 6.4–8.2)

## 2015-11-29 MED ORDER — GABAPENTIN 300 MG PO CAPS
300 MG | ORAL_CAPSULE | ORAL | 0 refills | Status: DC
Start: 2015-11-29 — End: 2016-02-27

## 2015-12-28 ENCOUNTER — Ambulatory Visit: Admit: 2015-12-28 | Primary: Family Medicine

## 2015-12-28 ENCOUNTER — Encounter

## 2015-12-28 ENCOUNTER — Inpatient Hospital Stay: Admit: 2015-12-28 | Primary: Family Medicine

## 2015-12-28 DIAGNOSIS — C50211 Malignant neoplasm of upper-inner quadrant of right female breast: Secondary | ICD-10-CM

## 2016-01-16 ENCOUNTER — Ambulatory Visit: Admit: 2016-01-16 | Discharge: 2016-01-16 | Payer: MEDICARE | Attending: Family Medicine | Primary: Family Medicine

## 2016-01-16 ENCOUNTER — Encounter

## 2016-01-16 DIAGNOSIS — I10 Essential (primary) hypertension: Secondary | ICD-10-CM

## 2016-01-16 LAB — BASIC METABOLIC PANEL
Anion Gap: 15 (ref 3–16)
BUN: 30 mg/dL — ABNORMAL HIGH (ref 7–20)
CO2: 28 mmol/L (ref 21–32)
Calcium: 9.8 mg/dL (ref 8.3–10.6)
Chloride: 97 mmol/L — ABNORMAL LOW (ref 99–110)
Creatinine: 1.5 mg/dL — ABNORMAL HIGH (ref 0.6–1.2)
GFR African American: 41 — AB (ref >60)
GFR Non-African American: 34 — AB (ref >60)
Glucose: 106 mg/dL — ABNORMAL HIGH (ref 70–99)
Potassium: 4.1 mmol/L (ref 3.5–5.1)
Sodium: 140 mmol/L (ref 136–145)

## 2016-01-16 LAB — LIPID PANEL
Cholesterol, Total: 227 mg/dL — ABNORMAL HIGH (ref 0–199)
HDL: 84 mg/dL — ABNORMAL HIGH (ref 40–60)
LDL Calculated: 125 mg/dL — ABNORMAL HIGH (ref <100)
Triglycerides: 89 mg/dL (ref 0–150)
VLDL Cholesterol Calculated: 18 mg/dL

## 2016-01-16 LAB — T4, FREE: T4 Free: 1.1 ng/dL (ref 0.9–1.8)

## 2016-01-16 LAB — MAGNESIUM: Magnesium: 2.1 mg/dL (ref 1.80–2.40)

## 2016-01-16 LAB — TSH: TSH: 2.46 u[IU]/mL (ref 0.27–4.20)

## 2016-01-16 MED ORDER — TRIAMTERENE-HCTZ 37.5-25 MG PO CAPS
37.5-25 | ORAL_CAPSULE | ORAL | 0 refills | Status: DC
Start: 2016-01-16 — End: 2016-03-05

## 2016-01-16 NOTE — Progress Notes (Signed)
Vaccine Information Sheet, "Influenza - Inactivated"  given to Jodi Walls, or parent/legal guardian of  Jodi Walls and verbalized understanding.    Patient responses:    Have you ever had a reaction to a flu vaccine? No  Are you able to eat eggs without adverse effects?  Yes  Do you have any current illness?  No  Have you ever had Guillian Barre Syndrome?  No    Flu vaccine given per order. Please see immunization tab.

## 2016-01-16 NOTE — Progress Notes (Signed)
 Subjective:      Patient ID: Jodi Walls is a 75 y.o. female.    HPI Comments: Patient presents with:  6 Month Follow-Up: hypertension, thyroid - pt is fasting    She is well  She is only on the dyazide prn for swelling and typically only few times a month  She will take 3 potassium on the day she takes it. She said that she was instructed on this from the renal doctor    Date of Birth:  06-04-1940    Date of Visit:  01/16/2016     -- Pcn (Penicillins) -- Swelling    --  Face and hands swelled   -- Ace Inhibitors -- Other (See Comments)    --  cough   -- Atorvastatin -- Other (See Comments)    --  Muscle aches   -- Zithromax (Azithromycin) -- Swelling   -- Codeine -- Nausea And Vomiting    Current Outpatient Prescriptions:  gabapentin  (NEURONTIN ) 300 MG capsule, TAKE ONE CAPSULE BY MOUTH EVERY DAY WITH SUPPER, Disp: 90 capsule, Rfl: 0  omeprazole  (PRILOSEC) 20 MG delayed release capsule, TAKE ONE CAPSULE BY MOUTH EVERY DAY, Disp: 90 capsule, Rfl: 1  triamterene -hydrochlorothiazide (DYAZIDE) 37.5-25 MG per capsule, TAKE 1 CAPSULE BY MOUTH DAILY, Disp: 90 capsule, Rfl: 1  levothyroxine  (SYNTHROID ) 75 MCG tablet, TAKE 1 TABLET BY MOUTH DAILY, Disp: 90 tablet, Rfl: 1  ALPRAZolam  (XANAX ) 0.25 MG tablet, Take 1 tablet by mouth daily as needed for Anxiety, Disp: 20 tablet, Rfl: 0  potassium chloride  (KLOR-CON  M) 10 MEQ extended release tablet, Take 1 tablet by mouth daily, Disp: 60 tablet, Rfl: 1  melatonin 3 MG TABS tablet, Take 3 mg by mouth nightly , Disp: , Rfl:   aspirin  81 MG tablet, Take 81 mg by mouth daily., Disp: , Rfl:     No current facility-administered medications for this visit.       ---------------------------------                  01/16/16                            0857            ---------------------------------   BP:             122/74            Site:          Left Arm           Position:        Sitting           Cuff Size:      Large Adult         Pulse:            60               Temp:      97.7 F (36.5 C)      TempSrc:         Oral             Weight: 211 lb 6.4 oz (95.9 kg)   Height:    5' 1.02 (1.55 m)     ---------------------------------  Body mass index is 39.92 kg/(m^2).     Wt Readings from Last 3 Encounters:  01/16/16 : 211 lb 6.4 oz (95.9 kg)  10/26/15 : 213 lb (96.6 kg)  07/13/15 : 216 lb (98  kg)    BP Readings from Last 3 Encounters:  01/16/16 : 122/74  10/26/15 : 138/82  07/13/15 : 130/74              Review of Systems   Constitutional: Negative for appetite change, chills, fever and unexpected weight change.   Respiratory: Negative for chest tightness and shortness of breath.    Cardiovascular: Negative for chest pain, palpitations and leg swelling.   Gastrointestinal: Negative for abdominal distention, abdominal pain, blood in stool, constipation, diarrhea, nausea and vomiting.   Neurological: Negative for headaches.       Objective:   Physical Exam   Constitutional: She appears well-developed and well-nourished. No distress.   Cardiovascular: Normal rate, regular rhythm and normal heart sounds.  Exam reveals no gallop and no friction rub.    No murmur heard.  Pulmonary/Chest: Effort normal and breath sounds normal. No accessory muscle usage. No tachypnea. No respiratory distress. She has no decreased breath sounds. She has no wheezes. She has no rhonchi. She has no rales.   Lymphadenopathy:     She has no cervical adenopathy.        Right: No supraclavicular adenopathy present.        Left: No supraclavicular adenopathy present.   Neurological: She is alert.   Skin: Skin is warm, dry and intact. She is not diaphoretic. No pallor.       Assessment:      1. Essential hypertension, benign  Basic Metabolic Panel    Lipid Panel   2. Acquired hypothyroidism  TSH without Reflex    T4, Free     She is seeing dr Felipe Horton cardiology next month  She declined to get the shingle vaccine.         Plan:      Continue to see the specialists  See us  back in 6 months

## 2016-01-16 NOTE — Patient Instructions (Signed)
Continue to see the specialists  See [name] back in 6 months

## 2016-01-27 MED ORDER — OMEPRAZOLE 20 MG PO CPDR
20 MG | ORAL_CAPSULE | ORAL | 0 refills | Status: DC
Start: 2016-01-27 — End: 2019-08-17

## 2016-02-21 NOTE — Other (Unsigned)
Progress Notes by Drue Stager., MD at 02/21/16 1345     Author:  Drue Stager., MD Service:  (none) Author Type:  Physician    Filed:  02/21/16 1407 Encounter Date:  02/21/2016 Status:  Signed    Editor:  Drue Stager., MD (Physician)         Jodi Walls  Nov 11, 1940  Chief Complaint     Patient presents with     ??????? 12 Month Follow Up      hx of LBBB/HTN   /started to get CP again last week[JS.1]        Jodi Walls presents to the [facility] cardiology clinic at [facility]   for her annual checkup.  Since I saw her last, she has generally been in good   health.  She continues to complain of dyspnea on exertion particularly when   she tries to ambulate a flight of stairs.  There is been overall no   significant decrease in her stamina.  She's had no complaints of feeling   lightheaded, dizzy or faint.  She endorsed compliance with her medications and   denied any adverse drug reactions.[JS.2]      Patient's Medications     Current Medications      ASPIRIN 81 MG PO TABLET    Take 81 mg by mouth daily.       Order Dose: 81 mg     CHOLECALCIFEROL, VITAMIN D3, 1,000 UNIT PO CAP    Take 1,000 Units by mouth   daily.       Order Dose: 1,000 Units     COENZYME Q10 (CO Q-10) 100 MG CAPSULE    Take 200 mg by mouth daily.       Order Dose: 200 mg     DIAZEPAM (VALIUM) 5 MG TABLET    Take 1 Tab by mouth every 6 hours as needed   (Spasms).       Order Dose: 5 mg     FUROSEMIDE (LASIX) 40 MG TABLET    Take 40 mg by mouth daily. PRN       Order Dose: 40 mg     GABAPENTIN (NEURONTIN) 300 MG CAPSULE    Take 300 mg by mouth daily.       Order Dose: 300 mg     LEVOTHYROXINE (SYNTHROID) 75 MCG PO TABLET    daily       Order Dose: --     MELATONIN 3 MG TABLET    Take 3 mg by mouth nightly at bedtime.       Order Dose: 3 mg     OMEPRAZOLE (PRILOSEC) 20 MG CAPSULE    Take 20 mg by mouth daily.       Order Dose: 20 mg     POTASSIUM CHLORIDE (KDUR) 10 MEQ PO TABLET    Take 10 mEq by mouth 2 times   daily. daily       Order  Dose: 10 mEq     TRIAMTERENE-HYDROCHLOROTHIAZIDE (DYAZIDE) 37.5-25 MG PO PER CAPSULE    Daily       Order Dose: --[JS.1]               Review of Systems   Constitutional: Negative.    HENT: Negative.    Respiratory: Positive for[CS.1] shortness of breath[CS.2].    Cardiovascular: Positive for[CS.1] chest pain[CS.3] and[CS.1] leg   swelling[CS.3].   Gastrointestinal: Negative.    Musculoskeletal: Negative.    Skin: Negative.  Neurological: Negative.    Endo/Heme/Allergies: Negative.    Psychiatric/Behavioral: Negative.[CS.1]                Vitals:     02/21/16 1351   BP: 140/84   Pulse: 61   Weight: 221 lb (100.2 kg)   Height: 5\' 1"  (1.549 m)     Body mass index is 41.76 kg/m????.[JS.1]    Physical Exam   Constitutional: She is[CS.1] oriented to person, place, and time[JS.3]   and[CS.1] well-developed, well-nourished, and in no distress[JS.3].[CS.1]   Vital signs are normal[JS.3].[CS.1] No distress[JS.3].   Eyes:[CS.1] Right eye visual fields normal[JS.3] and[CS.1] left eye visual   fields normal[JS.3].[CS.1] Conjunctivae[JS.3],[CS.1] EOM[JS.3] and[CS.1]   lids[JS.3] are normal.[CS.1] Pupils are equal, round, and reactive to   light[JS.3].   Neck:[CS.1] Trachea normal[JS.3],[CS.1] normal range of motion[JS.3],[CS.1]   full passive range of motion without pain[JS.3] and[CS.1] phonation   normal[JS.3].[CS.1] Neck supple[JS.3].[CS.1] Normal carotid   pulses[JS.3],[CS.1] no hepatojugular reflux[JS.3] and[CS.1] no JVD[JS.3]   present.[CS.1] Carotid bruit is not present[JS.3].[CS.1] No thyroid mass[JS.3]   and[CS.1] no thyromegaly[JS.3] present.   Cardiovascular:[CS.1] Normal rate[JS.3],[CS.1] regular rhythm[JS.3],[CS.1] S1   normal[JS.3],[CS.1] S2 normal[JS.3],[CS.1] normal heart sounds[JS.3],[CS.1]   intact distal pulses[JS.3] and[CS.1] normal pulses[JS.3].[CS.1]   No   extrasystoles[JS.3] are present.[CS.1] PMI is not displaced[JS.3].  Exam   reveals[CS.1] no gallop[JS.3],[CS.1] no S3[JS.3],[CS.1] no S4[JS.3] and[CS.1]    no friction rub[JS.3].[CS.1]    No murmur[JS.3] heard.[id number]   No systolic[JS.3] murmur is present[CS.1]    No diastolic[JS.3] murmur is present   Pulmonary/Chest:[CS.1] Effort normal[JS.3] and[CS.1] breath sounds   normal[JS.3]. No[CS.1] respiratory distress[JS.3]. She has[CS.1] no   wheezes[JS.3].   Abdominal:[CS.1] Normal appearance[JS.3],[CS.1] normal aorta[JS.3] and[CS.1]   bowel sounds are normal[JS.3]. There is[CS.1] no tenderness[JS.3]. There   is[CS.1] no CVA tenderness[JS.3].   Musculoskeletal:[CS.1] Normal range of motion[JS.3].   Neurological: She is[CS.1] alert[JS.3] and[CS.1] oriented to person, place,   and time[JS.3]. She has[CS.1] normal motor skills[JS.3],[CS.1] normal   sensation[JS.3],[CS.1] normal strength[JS.3],[CS.1] normal reflexes[JS.3]   and[CS.1] intact cranial nerves[JS.3].[CS.1] Gait[JS.3] normal.[CS.1] GCS   score is 15[JS.3].   Skin: Skin is[CS.1] warm[JS.3] and[CS.1] dry[JS.3].[CS.1] No   abrasion[JS.3],[CS.1] no bruising[JS.3],[CS.1] no burn[JS.3],[CS.1] no   ecchymosis[JS.3] and[CS.1] no rash[JS.3] noted. She is[CS.1] not   diaphoretic[JS.3]. No[CS.1] cyanosis[JS.3]. Nails show[CS.1] no   clubbing[JS.3].   Psychiatric:[CS.1] Mood[JS.3],[CS.1] memory[JS.3],[CS.1] affect[JS.3]   and[CS.1] judgment[JS.3] normal.[CS.1]   Vitals[JS.3] reviewed.      IMPRESSION:[CS.1]      ICD-9-CM ICD-10-CM    1. LBBB (left bundle branch block) 426.3 I44.7 ECG   2. Shortness of breath 786.05 R06.02[JS.1]        PLAN AND RECOMMENDATION:[CS.1]    1.  Left bundle branch block???????? This has been a stable finding on her surface   ECG.  She is otherwise asymptomatic.  Previous transthoracic echocardiogram   showed a structurally normal heart and preserved left ventricular systolic   function.  SPECT MPI showed normal myocardial perfusion.  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 There is a low risk of progression to   higher degrees of potentially symptomatic heart block.???? I will repeat an  ECG   and physical examination in one year.[JS.3]     2.  Shortness of breath????????This has been somewhat of a chronic complaint.  She   said she was taken off triamterene hydrochlorothiazide by her primary care   physician about 4 weeks ago.  At that time a basic metabolic profile showed a   potassium of 4.1 and creatinine of  1.5.  Since then, she says she's noticed   some increasing weight gain and dependent edema.  She took a single torsemide   tablet over the weekend and didn't endorse increasing urine output that day.    She'll he takes her loop diuretic as needed.  I suggested referring her today   for a BNP and repeat basic metabolic profile.  She may need to start taking   furosemide on a daily basis.  If her BNP is not significantly elevated, I may   consider referring her for coronary calcium score and using that to determine   if I should repeat stress testing to exclude this complaint as being an   anginal equivalent.[JS.4]  ????    --- Please note that I used Set designer software to generate the   above note. Occasionally words are mistranscribed despite my best efforts to   edit errors. ---    ????[JS.3]             (RETURN OFFICE VISIT)[CS.1]               Electronically signed by Drue Stager., MD on 02/21/16 1407.   Attribution Key    CS.1 - Erick Blinks, NCMA on 02/21/16 1348  CS.2 -   Erick Blinks, NCMA on 02/21/16 1351  CS.3 - Erick Blinks, NCMA on 02/21/16   1350  JS.1 - Drue Stager., MD on 02/21/16 1401  JS.2 - Drue Stager., MD   on 02/21/16 [place].3 - Drue Stager., MD on 02/21/16 [place].4 - Drue Stager., MD on 02/21/16 1405          Revision History      Date/Time User Provider Type Action   > 02/21/16 1407   Drue Stager., MD Physician Sign    02/21/16 1351 Erick Blinks, [place] Assistant Sign at close encounter

## 2016-02-27 MED ORDER — GABAPENTIN 300 MG PO CAPS
300 MG | ORAL_CAPSULE | ORAL | 1 refills | Status: DC
Start: 2016-02-27 — End: 2016-08-23

## 2016-03-05 ENCOUNTER — Ambulatory Visit: Admit: 2016-03-05 | Discharge: 2016-03-05 | Payer: MEDICARE | Attending: Family Medicine | Primary: Family Medicine

## 2016-03-05 DIAGNOSIS — I10 Essential (primary) hypertension: Secondary | ICD-10-CM

## 2016-03-05 NOTE — Telephone Encounter (Signed)
Pt called and stated that the medication that you want to know is     FUROSEMIDE 40 mg.   Take one by mouth as needed.     Pl advise   [phone]

## 2016-03-05 NOTE — Progress Notes (Signed)
 Subjective:      Patient ID: Jodi Walls is a 75 y.o. female.    Patient presents with:  1 Month Follow-Up: hypertension, - pt is fasting    Right heel pain for about a month. Especially when she first gets up. Like a bruise. Also some pain with walking  No injury    Holding on to fluid since being off the diuretic  She did see dr Felipe Horton cardiac  She gave me permission to update charts    Her recent bp and bnp all normal     Date of Birth:  1940-07-02    Date of Visit:  03/05/2016     -- Pcn (Penicillins) -- Swelling    --  Face and hands swelled   -- Ace Inhibitors -- Other (See Comments)    --  cough   -- Atorvastatin -- Other (See Comments)    --  Muscle aches   -- Zithromax (Azithromycin) -- Swelling   -- Codeine -- Nausea And Vomiting      Current Outpatient Prescriptions:   .  furosemide  (LASIX ) 40 MG tablet, Take 40 mg by mouth See Admin Instructions One daily prn edema, Disp: , Rfl:   .  gabapentin  (NEURONTIN ) 300 MG capsule, TAKE ONE CAPSULE BY MOUTH EVERY DAY WITH SUPPER, Disp: 90 capsule, Rfl: 1  .  omeprazole  (PRILOSEC) 20 MG delayed release capsule, TAKE ONE CAPSULE BY MOUTH EVERY DAY, Disp: 90 capsule, Rfl: 0  .  levothyroxine  (SYNTHROID ) 75 MCG tablet, TAKE 1 TABLET BY MOUTH DAILY, Disp: 90 tablet, Rfl: 1  .  ALPRAZolam  (XANAX ) 0.25 MG tablet, Take 1 tablet by mouth daily as needed for Anxiety, Disp: 20 tablet, Rfl: 0  .  melatonin 3 MG TABS tablet, Take 3 mg by mouth nightly , Disp: , Rfl:   .  aspirin  81 MG tablet, Take 81 mg by mouth daily., Disp: , Rfl:   .  potassium chloride  (KLOR-CON  M) 10 MEQ extended release tablet, Take 1 tablet by mouth daily, Disp: 60 tablet, Rfl: 1       She was instructed by dr Gerhard Knuckles to use 3 prn with the lasix       ---------------------------------------               03/05/16      03/05/16                   0936          0941      ---------------------------------------   BP:        (!) 142/80    (!) 142/80    Site:       Left Arm      Left Arm      Position:     Sitting       Sitting     Cuff Size:   Large Adult   Large Adult   Pulse:                       72        Temp:   97.5 F (36.4 C)              TempSrc:      Oral                     Weight: 221 lb (100.2 kg)  Height:  5' 1 (1.549 m)              ---------------------------------------  Body mass index is 41.76 kg/m.     Wt Readings from Last 3 Encounters:  03/05/16 : 221 lb (100.2 kg)  01/16/16 : 211 lb 6.4 oz (95.9 kg)  10/26/15 : 213 lb (96.6 kg)    BP Readings from Last 3 Encounters:  03/05/16 : (!) 142/80  01/16/16 : 122/74  10/26/15 : 138/82            Review of Systems   Constitutional: Negative for chills and fever.   Cardiovascular: Negative for chest pain.        She has hx of chest pain and doe and seen dr Felipe Horton for it   Gastrointestinal: Negative for abdominal pain.   Genitourinary: Negative for difficulty urinating, dysuria and hematuria.   Neurological: Negative for headaches.       Objective:   Physical Exam   Constitutional: She appears well-developed and well-nourished. No distress.   Cardiovascular: Normal rate, regular rhythm and normal heart sounds.  Exam reveals no gallop and no friction rub.    No murmur heard.  Trace to 1+ edema lower legs   Pulmonary/Chest: Effort normal and breath sounds normal. No accessory muscle usage. No tachypnea. No respiratory distress. She has no decreased breath sounds. She has no wheezes. She has no rhonchi. She has no rales.   Musculoskeletal:   Foot pink no lesions  Supple at the ankle  She is tender to palpate the heel    Lymphadenopathy:     She has no cervical adenopathy.        Right: No supraclavicular adenopathy present.        Left: No supraclavicular adenopathy present.   Neurological: She is alert.   Skin: Skin is warm, dry and intact. She is not diaphoretic. No pallor.       Assessment:       1. Essential hypertension, benign     2. Need for Tdap vaccination  Tdap (age 45y and older) IM  (BOOSTRIX)       Dr Gerhard Knuckles told her to take a lasix /demadex as needed with up to 3 k each time prn daily. I do not have the info on that  Water retention  Exercise for plantar faciitis      Plan:      Restart the water pill and the potassium from dr revis  Use it as needed Call me in 7 days. Also try using just every other day  Call me the name of the medication you are on  See me in 4 weeks.

## 2016-03-05 NOTE — Patient Instructions (Addendum)
 Restart the water pill and the potassium from dr revis  Use it as needed Call me in 7 days. Also try using just every other day  Call me the name of the medication you are on  See me in 4 weeks.     Patient Education        Plantar Fasciitis: Exercises  Your Care Instructions  Here are some examples of typical rehabilitation exercises for your condition. Start each exercise slowly. Ease off the exercise if you start to have pain.  Your doctor or physical therapist will tell you when you can start these exercises and which ones will work best for you.  How to do the exercises  Note: Each exercise should create a pulling feeling but should not cause pain.  Towel stretch    1. Sit with your legs extended and knees straight.  2. Place a towel around your foot just under the toes.  3. Hold each end of the towel in each hand, with your hands above your knees.  4. Pull back with the towel so that your foot stretches toward you.  5. Hold the position for at least 15 to 30 seconds.  6. Repeat 2 to 4 times a session, up to 5 sessions a day.  Calf stretch    Note: This exercise stretches the muscles at the back of the lower leg (the calf) and the Achilles tendon. Do this exercise 3 or 4 times a day, 5 days a week.  1. Stand facing a wall with your hands on the wall at about eye level. Put the leg you want to stretch about a step behind your other leg.  2. Keeping your back heel on the floor, bend your front knee until you feel a stretch in the back leg.  3. Hold the stretch for 15 to 30 seconds. Repeat 2 to 4 times.  Plantar fascia and calf stretch    Note: Stretching the plantar fascia and calf muscles can increase flexibility and decrease heel pain. You can do this exercise several times each day and before and after activity.  1. Stand on a step as shown above. Be sure to hold on to the banister.  2. Slowly let your heels down over the edge of the step as you relax your calf muscles. You should feel a gentle stretch  across the bottom of your foot and up the back of your leg to your knee.  3. Hold the stretch about 15 to 30 seconds, and then tighten your calf muscle a little to bring your heel back up to the level of the step. Repeat 2 to 4 times.  Towel curls    1. While sitting, place your foot on a towel on the floor and scrunch the towel toward you with your toes.  2. Then, also using your toes, push the towel away from you.  Note: Make this exercise more challenging by placing a weighted object, such as a soup can, on the other end of the towel.  Marble pickups    1. Put marbles on the floor next to a cup.  2. Using your toes, try to lift the marbles up from the floor and put them in the cup.  Follow-up care is a key part of your treatment and safety. Be sure to make and go to all appointments, and call your doctor if you are having problems. It's also a good idea to know your test results and keep a list of  the medicines you take.  Where can you learn more?  Go to [email or web address] and sign in to your MyChart account. Enter [phone] in the Search Health Information box to learn more about Plantar Fasciitis: Exercises.     If you do not have an account, please click on the Sign Up Now link.  Current as of: July 19, 2015  Content Version: 11.3   2006-2017 [employer]. Care instructions adapted under license by [facility]. If you have questions about a medical condition or this instruction, always ask your healthcare professional. [employer] disclaims any warranty or liability for your use of this information.

## 2016-03-13 NOTE — Telephone Encounter (Signed)
Pt calling w/ update water pill is helping some. But her foot still hurts and she is doing her exercise    [phone]

## 2016-03-14 NOTE — Telephone Encounter (Signed)
Ok see in 4 weeks as scheduled  She can see dr Fransisco Beau for the foot pain

## 2016-03-14 NOTE — Telephone Encounter (Signed)
[phone] - Left msg for Rikia to return call.

## 2016-03-14 NOTE — Telephone Encounter (Signed)
Pt advised and verbalized understanding

## 2016-04-02 ENCOUNTER — Ambulatory Visit: Admit: 2016-04-02 | Discharge: 2016-04-02 | Payer: MEDICARE | Attending: Family Medicine | Primary: Family Medicine

## 2016-04-02 DIAGNOSIS — J0100 Acute maxillary sinusitis, unspecified: Secondary | ICD-10-CM

## 2016-04-02 MED ORDER — FUROSEMIDE 40 MG PO TABS
40 MG | ORAL_TABLET | ORAL | 1 refills | Status: DC
Start: 2016-04-02 — End: 2018-11-13

## 2016-04-02 MED ORDER — DOXYCYCLINE HYCLATE 100 MG PO CAPS
100 MG | ORAL_CAPSULE | Freq: Two times a day (BID) | ORAL | 0 refills | Status: AC
Start: 2016-04-02 — End: 2016-04-12

## 2016-04-02 NOTE — Progress Notes (Signed)
 Subjective:      Patient ID: Jodi Walls is a 75 y.o. female.    Patient presents with:  Check-Up: hypertension - pt is fasting  Congestion: sneezing, clogged head, watery eyes x 3 days    No fever  Facial pain  pnd and runny nose  Head congestion  No cough    Date of Birth:  06-26-1940    Date of Visit:  04/02/2016     -- Pcn (Penicillins) -- Swelling    --  Face and hands swelled   -- Ace Inhibitors -- Other (See Comments)    --  cough   -- Atorvastatin -- Other (See Comments)    --  Muscle aches   -- Zithromax (Azithromycin) -- Swelling   -- Codeine -- Nausea And Vomiting    Current Outpatient Prescriptions:  furosemide  (LASIX ) 40 MG tablet, Take 40 mg by mouth See Admin Instructions One daily prn edema, Disp: , Rfl:   gabapentin  (NEURONTIN ) 300 MG capsule, TAKE ONE CAPSULE BY MOUTH EVERY DAY WITH SUPPER, Disp: 90 capsule, Rfl: 1  omeprazole  (PRILOSEC) 20 MG delayed release capsule, TAKE ONE CAPSULE BY MOUTH EVERY DAY, Disp: 90 capsule, Rfl: 0  levothyroxine  (SYNTHROID ) 75 MCG tablet, TAKE 1 TABLET BY MOUTH DAILY, Disp: 90 tablet, Rfl: 1  ALPRAZolam  (XANAX ) 0.25 MG tablet, Take 1 tablet by mouth daily as needed for Anxiety, Disp: 20 tablet, Rfl: 0  potassium chloride  (KLOR-CON  M) 10 MEQ extended release tablet, Take 1 tablet by mouth daily, Disp: 60 tablet, Rfl: 1  melatonin 3 MG TABS tablet, Take 3 mg by mouth nightly , Disp: , Rfl:   aspirin  81 MG tablet, Take 81 mg by mouth daily., Disp: , Rfl:     No current facility-administered medications for this visit.       ---------------------------------                  04/02/16                            0926            ---------------------------------   BP:             128/70            Site:          Left Arm           Position:        Sitting           Cuff Size:      Large Adult         Temp:      98.2 F (36.8 C)      TempSrc:         Oral             Weight: 212 lb 9.6 oz (96.4 kg)   Height:     5' 1 (1.549 m)       ---------------------------------  Body mass index is 40.17 kg/m.     Wt Readings from Last 3 Encounters:  04/02/16 : 212 lb 9.6 oz (96.4 kg)  03/05/16 : 221 lb (100.2 kg)  01/16/16 : 211 lb 6.4 oz (95.9 kg)    BP Readings from Last 3 Encounters:  04/02/16 : 128/70  03/05/16 : (!) 142/80  01/16/16 : 122/74          Review of Systems    Objective:  Physical Exam   Constitutional: She appears well-developed and well-nourished. No distress.   HENT:   Head: Normocephalic and atraumatic.   Right Ear: Tympanic membrane and ear canal normal.   Left Ear: Tympanic membrane and ear canal normal.   Nose: Mucosal edema present. Right sinus exhibits maxillary sinus tenderness. Left sinus exhibits maxillary sinus tenderness.   Mouth/Throat: Uvula is midline, oropharynx is clear and moist and mucous membranes are normal. No oral lesions.   Neck: Neck supple.   Cardiovascular: Normal rate, regular rhythm and normal heart sounds.  Exam reveals no gallop and no friction rub.    No murmur heard.  Pulmonary/Chest: Effort normal and breath sounds normal. No accessory muscle usage. No tachypnea. No respiratory distress. She has no decreased breath sounds. She has no wheezes. She has no rhonchi. She has no rales.   Lymphadenopathy:        Head (right side): No submental and no submandibular adenopathy present.        Head (left side): No submental and no submandibular adenopathy present.     She has no cervical adenopathy.        Right: No supraclavicular adenopathy present.        Left: No supraclavicular adenopathy present.   Neurological: She is alert.   Skin: Skin is warm, dry and intact. She is not diaphoretic. No pallor.       Assessment:       1. Acute non-recurrent maxillary sinusitis         She still see's dr Gerhard Knuckles for the renal function  She will stay with current medication  She is doing well overall.         Plan:      Do take the antibiotic  Call if no better  See us  back in 4 months      Orders Placed This Encounter    Medications   . doxycycline  hyclate (VIBRAMYCIN ) 100 MG capsule     Sig: Take 1 capsule by mouth 2 times daily for 10 days     Dispense:  20 capsule     Refill:  0   . furosemide  (LASIX ) 40 MG tablet     Sig: Take 1 tablet by mouth See Admin Instructions One daily prn edema     Dispense:  90 tablet     Refill:  1

## 2016-04-02 NOTE — Patient Instructions (Signed)
Do take the antibiotic  Call if no better  See [name] back in 4 months

## 2016-04-16 ENCOUNTER — Encounter

## 2016-04-16 LAB — BASIC METABOLIC PANEL
Anion Gap: 17 — ABNORMAL HIGH (ref 3–16)
BUN: 21 mg/dL — ABNORMAL HIGH (ref 7–20)
CO2: 23 mmol/L (ref 21–32)
Calcium: 9.2 mg/dL (ref 8.3–10.6)
Chloride: 106 mmol/L (ref 99–110)
Creatinine: 1.2 mg/dL (ref 0.6–1.2)
GFR African American: 53 — AB (ref >60)
GFR Non-African American: 44 — AB (ref >60)
Glucose: 95 mg/dL (ref 70–99)
Potassium: 4 mmol/L (ref 3.5–5.1)
Sodium: 146 mmol/L — ABNORMAL HIGH (ref 136–145)

## 2016-04-27 MED ORDER — OMEPRAZOLE 20 MG PO CPDR
20 MG | ORAL_CAPSULE | ORAL | 1 refills | Status: DC
Start: 2016-04-27 — End: 2016-08-27

## 2016-07-16 ENCOUNTER — Encounter: Attending: Family Medicine | Primary: Family Medicine

## 2016-07-19 MED ORDER — LEVOTHYROXINE SODIUM 75 MCG PO TABS
75 MCG | ORAL_TABLET | ORAL | 1 refills | Status: DC
Start: 2016-07-19 — End: 2017-01-17

## 2016-08-01 ENCOUNTER — Encounter: Attending: Family Medicine | Primary: Family Medicine

## 2016-08-23 ENCOUNTER — Encounter

## 2016-08-24 NOTE — Telephone Encounter (Signed)
-----   Message from Barron Alvine, MD sent at 08/24/2016  4:28 PM EDT -----  Need to know why she uses the gabapentin

## 2016-08-24 NOTE — Telephone Encounter (Signed)
[phone] (home) - Left msg for Tiwana to return call.

## 2016-08-27 ENCOUNTER — Ambulatory Visit: Admit: 2016-08-27 | Discharge: 2016-08-27 | Payer: MEDICARE | Attending: Family Medicine | Primary: Family Medicine

## 2016-08-27 DIAGNOSIS — I10 Essential (primary) hypertension: Secondary | ICD-10-CM

## 2016-08-27 MED ORDER — ALPRAZOLAM 0.25 MG PO TABS
0.25 MG | ORAL_TABLET | Freq: Every day | ORAL | 0 refills | Status: AC | PRN
Start: 2016-08-27 — End: 2016-09-16

## 2016-08-27 MED ORDER — GABAPENTIN 300 MG PO CAPS
300 MG | ORAL_CAPSULE | ORAL | 1 refills | Status: DC
Start: 2016-08-27 — End: 2017-02-18

## 2016-08-27 NOTE — Telephone Encounter (Signed)
Neisha advised and states she has been on it for years for her leg cramps.

## 2016-08-27 NOTE — Patient Instructions (Signed)
Stay with the diet  continue the medications  See in 6 months

## 2016-08-27 NOTE — Progress Notes (Signed)
Subjective:      Patient ID: Jodi Walls is a 76 y.o. female.    Patient presents with:  Follow-up: hypertension - pt is fasting    She is doing well and just saw dr Dorette Grate and all ok   She has no c/o  She is to see the oncology dr in June    She had blood work done a month ago and drawn here but not in our system as was ordered by dr Dorette Grate    She wanted to have refill of xanax that she uses for sleep seldom uses  She is cautioned on its use and warnings    ---------------------------               08/27/16                      1058         ---------------------------   BP:          128/84         Site:       Left Arm        Position:     Sitting        Cuff Size:   Large Adult      Pulse:         64           Temp:   98.2 F (36.8 C)   TempSrc:      Oral          Weight: 214 lb (97.1 kg)    Height:  5\' 1"  (1.549 m)   ---------------------------  Wt Readings from Last 3 Encounters:  08/27/16 : 214 lb (97.1 kg)  04/02/16 : 212 lb 9.6 oz (96.4 kg)  03/05/16 : 221 lb (100.2 kg)    BP Readings from Last 3 Encounters:  08/27/16 : 128/84  04/02/16 : 128/70  03/05/16 : (!) 142/80      Current Outpatient Prescriptions:   .  levothyroxine (SYNTHROID) 75 MCG tablet, TAKE 1 TABLET BY MOUTH DAILY, Disp: 90 tablet, Rfl: 1  .  furosemide (LASIX) 40 MG tablet, Take 1 tablet by mouth See Admin Instructions One daily prn edema, Disp: 90 tablet, Rfl: 1  .  gabapentin (NEURONTIN) 300 MG capsule, TAKE ONE CAPSULE BY MOUTH EVERY DAY WITH SUPPER, Disp: 90 capsule, Rfl: 1  .  omeprazole (PRILOSEC) 20 MG delayed release capsule, TAKE ONE CAPSULE BY MOUTH EVERY DAY, Disp: 90 capsule, Rfl: 0  .  potassium chloride (KLOR-CON M) 10 MEQ extended release tablet, Take 1 tablet by mouth daily (Patient taking differently: Take 10 mEq by mouth daily as needed (use when you take the water pill) ), Disp: 60 tablet, Rfl: 1  .  melatonin 3 MG TABS tablet, Take 3 mg by mouth nightly , Disp: , Rfl:   .  aspirin  81 MG tablet, Take 81 mg by mouth daily., Disp: , Rfl:               Review of Systems    Objective:   Physical Exam   Constitutional: She appears well-developed and well-nourished. No distress.   Cardiovascular: Normal rate, regular rhythm and normal heart sounds.  Exam reveals no gallop and no friction rub.    No murmur heard.  Pulmonary/Chest: Effort normal and breath sounds normal. No accessory muscle usage. No tachypnea. No respiratory distress. She has no decreased breath sounds. She has no wheezes. She  has no rhonchi. She has no rales.   Lymphadenopathy:     She has no cervical adenopathy.        Right: No supraclavicular adenopathy present.        Left: No supraclavicular adenopathy present.   Neurological: She is alert.   Skin: Skin is warm, dry and intact. She is not diaphoretic. No pallor.       Assessment:        Diagnosis Orders   1. Essential hypertension, benign         Overall well  She is stable and we will try and get the blood results  She is on gabapentin for leg cramps and it does help. Both sides      Plan:      Stay with the diet  continue the medications  See in 6 months      Orders Placed This Encounter   Medications   . ALPRAZolam (XANAX) 0.25 MG tablet     Sig: Take 1 tablet by mouth daily as needed for Sleep or Anxiety for up to 20 days..     Dispense:  20 tablet     Refill:  0

## 2016-10-22 ENCOUNTER — Encounter: Attending: Hematology & Oncology | Primary: Family Medicine

## 2016-10-25 MED ORDER — OMEPRAZOLE 20 MG PO CPDR
20 MG | ORAL_CAPSULE | ORAL | 1 refills | Status: DC
Start: 2016-10-25 — End: 2016-11-13

## 2016-11-13 ENCOUNTER — Ambulatory Visit: Admit: 2016-11-13 | Discharge: 2016-11-13 | Payer: MEDICARE | Attending: Family Medicine | Primary: Family Medicine

## 2016-11-13 DIAGNOSIS — R21 Rash and other nonspecific skin eruption: Secondary | ICD-10-CM

## 2016-11-13 MED ORDER — FAMCICLOVIR 500 MG PO TABS
500 | ORAL_TABLET | Freq: Three times a day (TID) | ORAL | 0 refills | Status: AC
Start: 2016-11-13 — End: 2016-11-20

## 2016-11-13 NOTE — Progress Notes (Signed)
Subjective:      Patient ID: Jodi Walls is a 76 y.o. female.    Patient presents with:  Rash: itchy painful rash on right leg x 4 days    It does hurt  She has on the right lateral upper leg    height is 5\' 1"  (1.549 m) and weight is 211 lb 9.6 oz (96 kg). Her oral temperature is 97.7 F (36.5 C). Her blood pressure is 134/84.           Review of Systems    Objective:   Physical Exam   Constitutional: She appears well-developed and well-nourished. No distress.   Neurological: She is alert.   Skin: Skin is warm and dry. She is not diaphoretic. No pallor.            Assessment:       Diagnosis Orders   1. Rash       Zoster or herpetic rash      Plan:      If this recurs do come in when the blister is clear and fresh so I can culture  Use the medication  Keep the area clean with soap and water      Orders Placed This Encounter   Medications   . famciclovir (FAMVIR) 500 MG tablet     Sig: Take 1 tablet by mouth 3 times daily for 7 days     Dispense:  21 tablet     Refill:  0

## 2016-11-13 NOTE — Patient Instructions (Signed)
If this recurs do come in when the blister is clear and fresh so I can culture  Use the medication  Keep the area clean with soap and water

## 2016-11-14 ENCOUNTER — Ambulatory Visit: Admit: 2016-11-14 | Discharge: 2016-11-14 | Payer: MEDICARE | Attending: Family Medicine | Primary: Family Medicine

## 2016-11-14 DIAGNOSIS — R238 Other skin changes: Secondary | ICD-10-CM

## 2016-11-14 NOTE — Progress Notes (Signed)
Subjective:      Patient ID: Jodi Walls is a 76 y.o. female.    Patient presents with:  Blister: on right leg started last night    She had a few more blisters to break out  On her right leg  She also had one on the right upper chest  She is using the meds  No worse no fever    Date of Birth:  [birth year]    Date of Visit:  11/14/2016     -- Pcn (Penicillins) -- Swelling    --  Face and hands swelled   -- Ace Inhibitors -- Other (See Comments)    --  cough   -- Atorvastatin -- Other (See Comments)    --  Muscle aches   -- Zithromax (Azithromycin) -- Swelling   -- Codeine -- Nausea And Vomiting    Current Outpatient Prescriptions:  famciclovir (FAMVIR) 500 MG tablet, Take 1 tablet by mouth 3 times daily for 7 days, Disp: 21 tablet, Rfl: 0  gabapentin (NEURONTIN) 300 MG capsule, TAKE ONE CAPSULE BY MOUTH EVERY DAY WITH SUPPER, Disp: 90 capsule, Rfl: 1  levothyroxine (SYNTHROID) 75 MCG tablet, TAKE 1 TABLET BY MOUTH DAILY, Disp: 90 tablet, Rfl: 1  furosemide (LASIX) 40 MG tablet, Take 1 tablet by mouth See Admin Instructions One daily prn edema, Disp: 90 tablet, Rfl: 1  omeprazole (PRILOSEC) 20 MG delayed release capsule, TAKE ONE CAPSULE BY MOUTH EVERY DAY, Disp: 90 capsule, Rfl: 0  potassium chloride (KLOR-CON M) 10 MEQ extended release tablet, Take 1 tablet by mouth daily (Patient taking differently: Take 10 mEq by mouth daily as needed (use when you take the water pill) ), Disp: 60 tablet, Rfl: 1  melatonin 3 MG TABS tablet, Take 3 mg by mouth nightly , Disp: , Rfl:   aspirin 81 MG tablet, Take 81 mg by mouth daily., Disp: , Rfl:     No current facility-administered medications for this visit.       ---------------------------               11/14/16                      1102         ---------------------------   BP:          138/82         Site:       Left Arm        Position:     Sitting        Cuff Size:   Large Adult      Temp:   98.1 F (36.7 C)   TempSrc:      Oral           Weight: 211 lb (95.7 kg)    Height:  5\' 1"  (1.549 m)   ---------------------------  Body mass index is 39.87 kg/m.     Wt Readings from Last 3 Encounters:  11/14/16 : 211 lb (95.7 kg)  11/13/16 : 211 lb 9.6 oz (96 kg)  08/27/16 : 214 lb (97.1 kg)    BP Readings from Last 3 Encounters:  11/14/16 : 138/82  11/13/16 : 134/84  08/27/16 : 128/84          Review of Systems    Objective:   Physical Exam   Constitutional: She appears well-developed and well-nourished. No distress.   Neurological: She is alert.   Skin: Skin is warm, dry and intact.  She is not diaphoretic. No pallor.            Assessment:       Diagnosis Orders   1. Rash, vesicular  HSV PCR    VARICELLA ZOSTER BY PCR           Plan:      She will finish the meds  She will call if a problem or go to er

## 2016-11-17 LAB — VARICELLA ZOSTER BY PCR: Varicella-Zoster, PCR: DETECTED — AB

## 2016-11-17 LAB — HSV PCR: HERPES SIMPLEX VIRUS BY PCR: NOT DETECTED

## 2016-11-22 ENCOUNTER — Telehealth

## 2016-11-22 MED ORDER — TRAMADOL HCL 50 MG PO TABS
50 | ORAL_TABLET | Freq: Four times a day (QID) | ORAL | 0 refills | 5.00 days | Status: AC | PRN
Start: 2016-11-22 — End: 2016-11-27

## 2016-11-22 NOTE — Telephone Encounter (Signed)
Pt would like to know if she could have some pain meds for her shingles.   Pl advise  [phone] (home)

## 2016-11-22 NOTE — Telephone Encounter (Signed)
Ok pick up

## 2016-11-22 NOTE — Telephone Encounter (Signed)
[phone] (home)  -Left msg for Mellina stating that script is ready for pick up.

## 2017-01-17 MED ORDER — LEVOTHYROXINE SODIUM 75 MCG PO TABS
75 MCG | ORAL_TABLET | ORAL | 1 refills | Status: DC
Start: 2017-01-17 — End: 2017-07-18

## 2017-02-18 ENCOUNTER — Encounter

## 2017-02-18 MED ORDER — GABAPENTIN 300 MG PO CAPS
300 MG | ORAL_CAPSULE | ORAL | 1 refills | Status: DC
Start: 2017-02-18 — End: 2017-08-20

## 2017-02-22 NOTE — Other (Unsigned)
Progress Notes by Drue Stager., MD at 02/22/17 1100     Author:  Drue Stager., MD Service:  (none) Author Type:  Physician    Filed:  02/22/17 1115 Encounter Date:  02/22/2017 Status:  Signed    Editor:  Drue Stager., MD (Physician)         Subjective    Jodi Walls  Aug 19, 1940  Chief Complaint    Patient presents with    ? 12 Month Follow Up   ? HTN          Mrs. Treese returns to the [facility] cardiology clinic in [place]   for an annual checkup.  At her last office visit she was complaining of   shortness of breath.  I referred her for a BNP which was only 54.  She had a   coronary calcium score performed which was only 29.  She is not complaining of   any episodes of feeling lightheaded, dizzy or faint.[JS.1]    She continues to complain of some dyspnea on exertion and now is also   complaining of some discomfort in her chest seems to localize beneath the left   breast.[JS.2]    Patient's Medications     Current Medications      ASPIRIN 81 MG PO TABLET    Take 81 mg by mouth daily.       Order Dose: 81 mg     FUROSEMIDE (LASIX) 40 MG TABLET    Take 40 mg by mouth daily. PRN       Order Dose: 40 mg     GABAPENTIN (NEURONTIN) 300 MG CAPSULE    Take 300 mg by mouth daily.       Order Dose: 300 mg     LEVOTHYROXINE (SYNTHROID) 75 MCG PO TABLET    daily       Order Dose: --     MELATONIN 3 MG TABLET    Take 3 mg by mouth nightly at bedtime.       Order Dose: 3 mg     OMEPRAZOLE (PRILOSEC) 20 MG CAPSULE    Take 20 mg by mouth daily.       Order Dose: 20 mg     POTASSIUM CHLORIDE (KDUR) 10 MEQ PO TABLET    Take 10 mEq by mouth 2 times   daily. daily       Order Dose: 10 mEq        Discontinued Medications      CHOLECALCIFEROL, VITAMIN D3, 1,000 UNIT PO CAP    Take 1,000 Units by mouth   daily.       Notes: --     COENZYME Q10 (CO Q-10) 100 MG CAPSULE    Take 200 mg by mouth daily.       Notes: --     DIAZEPAM (VALIUM) 5 MG TABLET    Take 1 Tab by mouth every 6 hours as needed   (Spasms).        Notes: For questions regarding Rx or for refill information, please   contact office of Dr Darius Bump [phone]     TRIAMTERENE-HYDROCHLOROTHIAZIDE (DYAZIDE) 37.5-25 MG PO PER CAPSULE    Daily       Notes: --[JS.1]            ROS    Review of Systems   Constitutional: Negative.    HENT: Negative.    Respiratory: Negative.    Cardiovascular: Positive for leg swelling.  Gastrointestinal: Negative.    Musculoskeletal: Negative.    Skin: Negative.    Neurological: Negative.    Endo/Heme/Allergies: Negative.    Psychiatric/Behavioral: Negative.[PR.1]            Objective    Vitals:     02/22/17 1055   BP: 172/90   Pulse: 54   Weight: 205 lb (93 kg)   Height: 5\' 1"  (1.549 m)     Body mass index is 38.73 kg/m.[JS.1]    Physical Exam   Constitutional: She is[PR.1] oriented to person, place, and time[JS.1]   and[PR.1] well-developed, well-nourished, and in no distress[JS.1].[PR.1]   Vital signs are normal[JS.1].[PR.1] No distress[JS.1].   Eyes:[PR.1] Right eye visual fields normal[JS.1] and[PR.1] left eye visual   fields normal[JS.1].[PR.1] Conjunctivae[JS.1],[PR.1] EOM[JS.1] and[PR.1]   lids[JS.1] are normal.[PR.1] Pupils are equal, round, and reactive to   light[JS.1].   Neck:[PR.1] Trachea normal[JS.1],[PR.1] normal range of motion[JS.1],[PR.1]   full passive range of motion without pain[JS.1] and[PR.1] phonation   normal[JS.1].[PR.1] Neck supple[JS.1].[PR.1] Normal carotid   pulses[JS.1],[PR.1] no hepatojugular reflux[JS.1] and[PR.1] no JVD[JS.1]   present.[PR.1] Carotid bruit is not present[JS.1].[PR.1] No thyroid mass[JS.1]   and[PR.1] no thyromegaly[JS.1] present.   Cardiovascular:[PR.1] Normal rate[JS.1],[PR.1] regular rhythm[JS.1],[PR.1] S1   normal[JS.1],[PR.1] S2 normal[JS.1],[PR.1] normal heart sounds[JS.1],[PR.1]   intact distal pulses[JS.1] and[PR.1] normal pulses[JS.1].[PR.1]   No   extrasystoles[JS.1] are present.[PR.1] PMI is not displaced[JS.1].  Exam   reveals[PR.1] no gallop[JS.1],[PR.1] no  S3[JS.1],[PR.1] no S4[JS.1] and[PR.1]   no friction rub[JS.1].[PR.1]    No murmur[JS.1] heard.[WF.0]   No systolic[JS.1] murmur is XNATFTD[DU.2]    No diastolic[JS.1] murmur is present   Pulmonary/Chest:[PR.1] Effort normal[JS.1] and[PR.1] breath sounds   normal[JS.1]. No[PR.1] respiratory distress[JS.1]. She has[PR.1] no   wheezes[JS.1].   Abdominal:[PR.1] Normal appearance[JS.1],[PR.1] normal aorta[JS.1] and[PR.1]   bowel sounds are normal[JS.1]. There is[PR.1] no tenderness[JS.1]. There   is[PR.1] no CVA tenderness[JS.1].   Musculoskeletal:[PR.1] Normal range of motion[JS.1].   Neurological: She is[PR.1] alert[JS.1] and[PR.1] oriented to person, place,   and time[JS.1]. She has[PR.1] normal motor skills[JS.1],[PR.1] normal   sensation[JS.1],[PR.1] normal strength[JS.1],[PR.1] normal reflexes[JS.1]   and[PR.1] intact cranial nerves[JS.1].[PR.1] Gait[JS.1] normal.[PR.1] GCS   score is 15[JS.1].   Skin: Skin is[PR.1] warm[JS.1] and[PR.1] dry[JS.1].[PR.1] No   abrasion[JS.1],[PR.1] no bruising[JS.1],[PR.1] no burn[JS.1],[PR.1] no   ecchymosis[JS.1] and[PR.1] no rash[JS.1] noted. She is[PR.1] not   diaphoretic[JS.1]. No[PR.1] cyanosis[JS.1]. Nails show[PR.1] no   clubbing[JS.1].   Psychiatric:[PR.1] Mood[JS.1],[PR.1] memory[JS.1],[PR.1] affect[JS.1]   and[PR.1] judgment[JS.1] normal.[PR.1]   Vitals[JS.1] reviewed.        IMPRESSION:[PR.1]      ICD-10-CM    1. LBBB (left bundle branch block) I44.7 ECG   2. Agatston coronary artery calcium score less than 100 R93.1    3. 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 Left bundle branch block?"This has been a stable finding on her   surface ECG. She is otherwise asymptomatic. Previous transthoracic   echocardiogram showed a structurally normal heart and preserved left   ventricular systolic function. SPECT MPI showed normal myocardial perfusion.   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 There is a low risk   of  progression to higher degrees of potentially symptomatic heart block.    2.  Shortness of breath?"This has been somewhat of a chronic complaint.[JS.1]        3.  Chest discomfort?"Very atypical of angina.  ECG nondiagnostic.  Coronary   calcium score 2017 was only 29.  I recommended referral for Lexiscan SPECT  MPI.    I will see her back for follow-up in one year.[JS.2]      --- Please note that I used Set designer software to generate the   above note. Occasionally words are mistranscribed despite my best efforts to   edit errors. ---     [JS.1]        Electronically signed by Drue Stager., MD on 02/22/17 1115.   Attribution Key    JS.1 - Drue Stager., MD on 02/22/17 1103  JS.2 - Drue Stager., MD on 02/22/17 1114  PR.1 - Loni Dolly, RMA on 02/22/17 1102            Revision History      Date/Time User Provider Type Action   > 02/22/17 1115   Drue Stager., MD Physician Sign    02/22/17 1102 Loni Dolly, [facility]   Assistant Sign at close encounter

## 2017-03-04 ENCOUNTER — Encounter

## 2017-03-04 ENCOUNTER — Ambulatory Visit: Admit: 2017-03-04 | Discharge: 2017-03-04 | Payer: MEDICARE | Attending: Family Medicine | Primary: Family Medicine

## 2017-03-04 DIAGNOSIS — I10 Essential (primary) hypertension: Secondary | ICD-10-CM

## 2017-03-04 LAB — COMPREHENSIVE METABOLIC PANEL
ALT: 14 U/L (ref 10–40)
AST: 18 U/L (ref 15–37)
Albumin/Globulin Ratio: 1.9 (ref 1.1–2.2)
Albumin: 4.4 g/dL (ref 3.4–5.0)
Alkaline Phosphatase: 92 U/L (ref 40–129)
Anion Gap: 16 (ref 3–16)
BUN: 23 mg/dL — ABNORMAL HIGH (ref 7–20)
CO2: 22 mmol/L (ref 21–32)
Calcium: 9.9 mg/dL (ref 8.3–10.6)
Chloride: 103 mmol/L (ref 99–110)
Creatinine: 1.1 mg/dL (ref 0.6–1.2)
GFR African American: 58 — AB (ref >60)
GFR Non-African American: 48 — AB (ref >60)
Globulin: 2.3 g/dL
Glucose: 96 mg/dL (ref 70–99)
Potassium: 4.6 mmol/L (ref 3.5–5.1)
Sodium: 141 mmol/L (ref 136–145)
Total Bilirubin: 0.5 mg/dL (ref 0.0–1.0)
Total Protein: 6.7 g/dL (ref 6.4–8.2)

## 2017-03-04 LAB — T4, FREE: T4 Free: 1.2 ng/dL (ref 0.9–1.8)

## 2017-03-04 LAB — LIPID PANEL
Cholesterol, Total: 225 mg/dL — ABNORMAL HIGH (ref 0–199)
HDL: 80 mg/dL — ABNORMAL HIGH (ref 40–60)
LDL Calculated: 129 mg/dL — ABNORMAL HIGH (ref <100)
Triglycerides: 82 mg/dL (ref 0–150)
VLDL Cholesterol Calculated: 16 mg/dL

## 2017-03-04 LAB — TSH: TSH: 1.42 u[IU]/mL (ref 0.27–4.20)

## 2017-03-04 NOTE — Patient Instructions (Signed)
Stay with the diet  See in 6 months

## 2017-03-04 NOTE — Progress Notes (Signed)
Subjective:      Patient ID: Jodi Walls is a 76 y.o. female.    Patient presents with:  6 Month Follow-Up: hypertension, lipids, thyroid - pt is fasting    She is well  She got the flu shot    She has no c/o and overall well     She also just had a stress gxt with imaging and all ok    meds the same      Date of Birth:  [birth year]    Date of Visit:  03/04/2017     -- Pcn (Penicillins) -- Swelling    --  Face and hands swelled   -- Ace Inhibitors -- Other (See Comments)    --  cough   -- Atorvastatin -- Other (See Comments)    --  Muscle aches   -- Zithromax (Azithromycin) -- Swelling   -- Codeine -- Nausea And Vomiting    Current Outpatient Prescriptions:  gabapentin (NEURONTIN) 300 MG capsule, TAKE ONE CAPSULE BY MOUTH EVERY DAY WITH SUPPER, Disp: 90 capsule, Rfl: 1  levothyroxine (SYNTHROID) 75 MCG tablet, TAKE 1 TABLET BY MOUTH DAILY, Disp: 90 tablet, Rfl: 1  furosemide (LASIX) 40 MG tablet, Take 1 tablet by mouth See Admin Instructions One daily prn edema, Disp: 90 tablet, Rfl: 1  omeprazole (PRILOSEC) 20 MG delayed release capsule, TAKE ONE CAPSULE BY MOUTH EVERY DAY, Disp: 90 capsule, Rfl: 0  potassium chloride (KLOR-CON M) 10 MEQ extended release tablet, Take 1 tablet by mouth daily (Patient taking differently: Take 10 mEq by mouth daily as needed (use when you take the water pill) ), Disp: 60 tablet, Rfl: 1  melatonin 3 MG TABS tablet, Take 3 mg by mouth nightly , Disp: , Rfl:   aspirin 81 MG tablet, Take 81 mg by mouth daily., Disp: , Rfl:   FLUZONE HIGH-DOSE 0.5 ML SUSY injection, ADM 0.5ML IM UTD, Disp: , Rfl: 0    No current facility-administered medications for this visit.       ---------------------------               03/04/17                      1059         ---------------------------   BP:          138/84         Site:    Left Upper Arm     Position:     Sitting        Cuff Size:   Large Adult      Pulse:         64           Temp:   97.6 F (36.4 C)   TempSrc:       Oral          Weight: 204 lb (92.5 kg)    Height:  5\' 1"  (1.549 m)   ---------------------------  Body mass index is 38.55 kg/m.     Wt Readings from Last 3 Encounters:  She has intentionally lost weight  03/04/17 : 204 lb (92.5 kg)  11/14/16 : 211 lb (95.7 kg)  11/13/16 : 211 lb 9.6 oz (96 kg)    BP Readings from Last 3 Encounters:  03/04/17 : 138/84  11/14/16 : 138/82  11/13/16 : 134/84            Review of Systems    Objective:  Physical Exam   Constitutional: She appears well-developed and well-nourished. No distress.   Eyes: No scleral icterus.   Cardiovascular: Normal rate, regular rhythm and normal heart sounds.  Exam reveals no gallop and no friction rub.    No murmur heard.  Pulmonary/Chest: Effort normal and breath sounds normal. No accessory muscle usage. No tachypnea. No respiratory distress. She has no decreased breath sounds. She has no wheezes. She has no rhonchi. She has no rales.   Lymphadenopathy:     She has no cervical adenopathy.        Right: No supraclavicular adenopathy present.        Left: No supraclavicular adenopathy present.   Neurological: She is alert. She displays no tremor.   Skin: Skin is warm, dry and intact. She is not diaphoretic. No pallor.   Psychiatric: She has a normal mood and affect. Her speech is normal.       Assessment:        Diagnosis Orders   1. Essential hypertension, benign  Comprehensive Metabolic Panel    Lipid Panel   2. Acquired hypothyroidism  TSH without Reflex    T4, Free   3. Chronic kidney disease, stage III (moderate) (HCC)  Comprehensive Metabolic Panel       She is well   Get blood today  She did not want to get the shingle vaccine due to cost      Plan:      Stay with the diet  See in 6 months        Barron Alvine, MD

## 2017-07-18 MED ORDER — LEVOTHYROXINE SODIUM 75 MCG PO TABS
75 MCG | ORAL_TABLET | ORAL | 1 refills | Status: DC
Start: 2017-07-18 — End: 2018-01-16

## 2017-08-20 ENCOUNTER — Encounter

## 2017-08-20 MED ORDER — GABAPENTIN 300 MG PO CAPS
300 MG | ORAL_CAPSULE | ORAL | 1 refills | Status: DC
Start: 2017-08-20 — End: 2018-02-14

## 2017-08-27 IMAGING — CT CT ABDOMEN AND PELVIS WITH CONTRAST
2 of 3 series · 17 of 46 positions shown, 19 images · IV contrast (isovue)
Comparison: There are no previous exams available for comparison.

# [id number] 
CT ABDOMEN AND PELVIS WITH CONTRAST, 08/27/2017 [DATE]: 
CLINICAL INDICATION:  Lower abdominal pain and cramping intermittent for a 
year. 
Urinary frequency. Previous hysterectomy. 
The patient's eGFR was calculated to be 86 using the i-STAT device. 
A search for DICOM formatted images was conducted for prior CT imaging studies 
completed at a non-affiliated media free facility.
TECHNIQUE: The abdomen and pelvis were scanned from lung bases through the 
pubic rami with 100 cc's of Isovue 300 injected intravenously on a 
high-resolution Ct scanner using dose reduction techniques.  Routine MPR 
reconstructions were performed.

[Series 4: abd/pel ax w · axial · 0.88mm/px · z∈[-431,-59]mm · 14 of 144 slices shown, 16 images]
[im 10/144  soft-tissue]
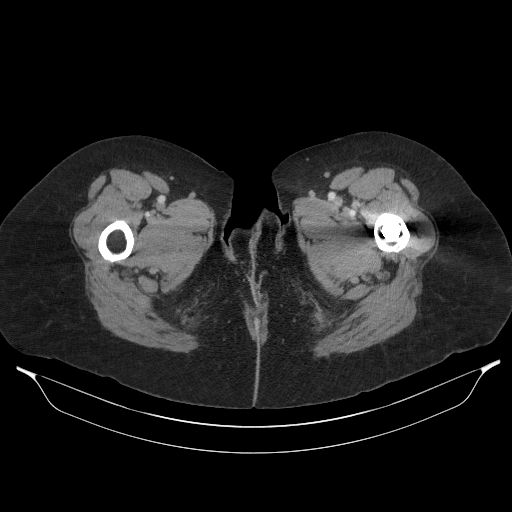
[im 10/144  bone]
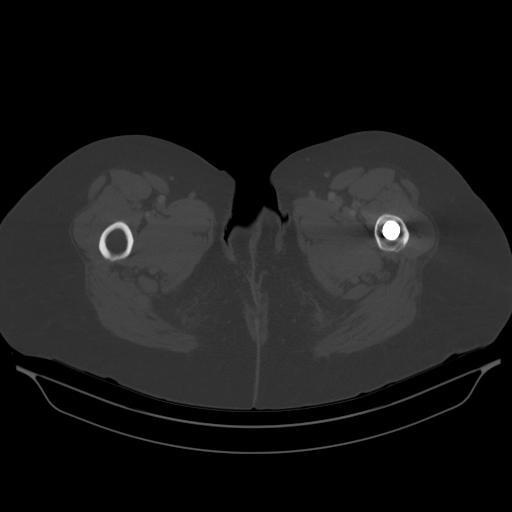
[im 19/144  soft-tissue]
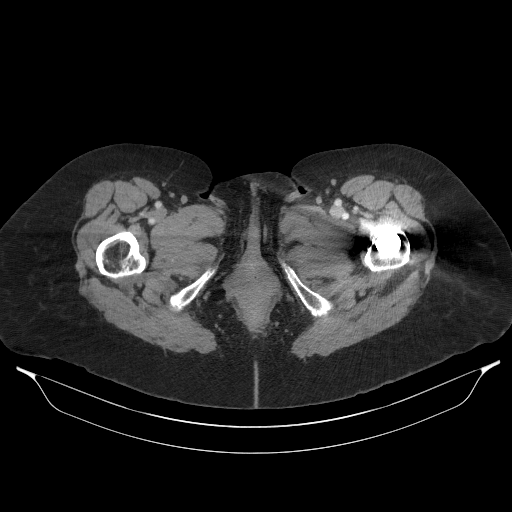
[im 28/144  soft-tissue]
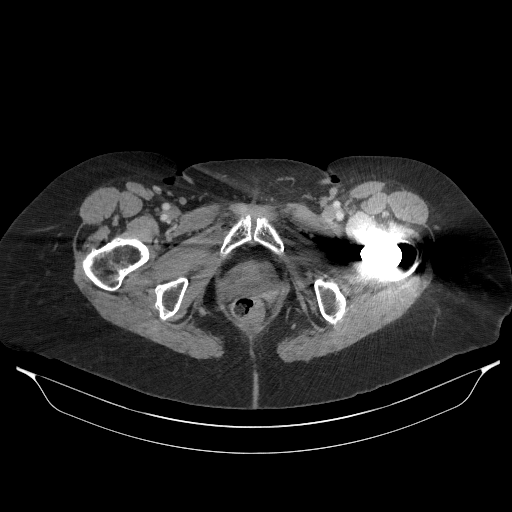
[im 37/144  soft-tissue]
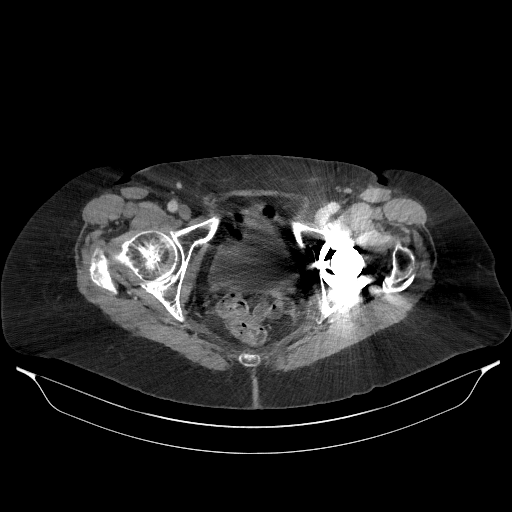
[im 47/144  soft-tissue]
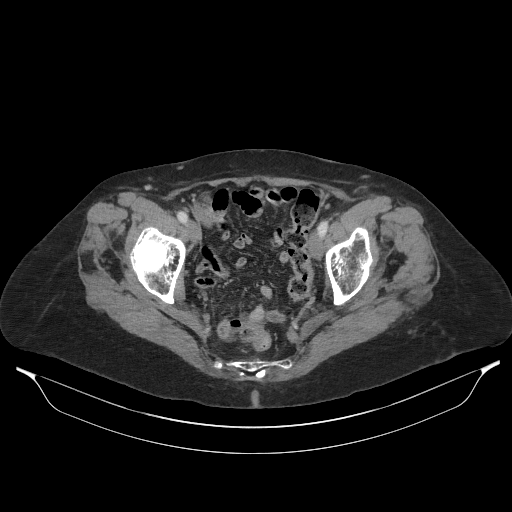
[im 56/144  soft-tissue]
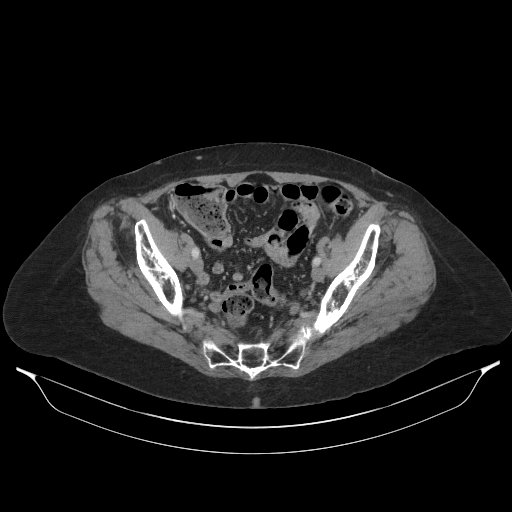
[im 65/144  soft-tissue]
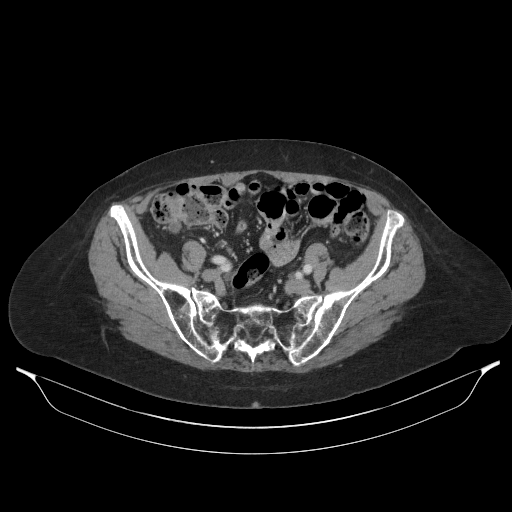
[im 79/144  soft-tissue]
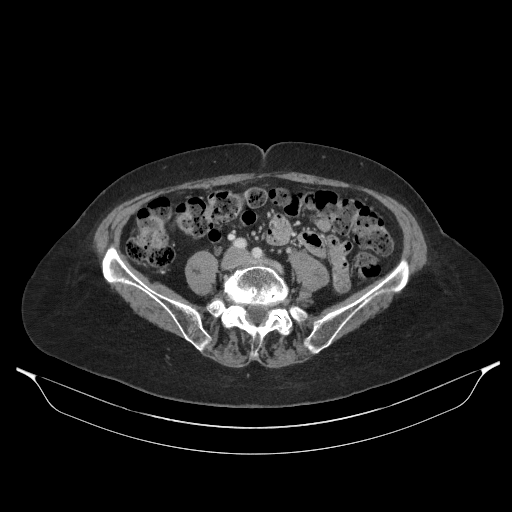
[im 88/144  soft-tissue]
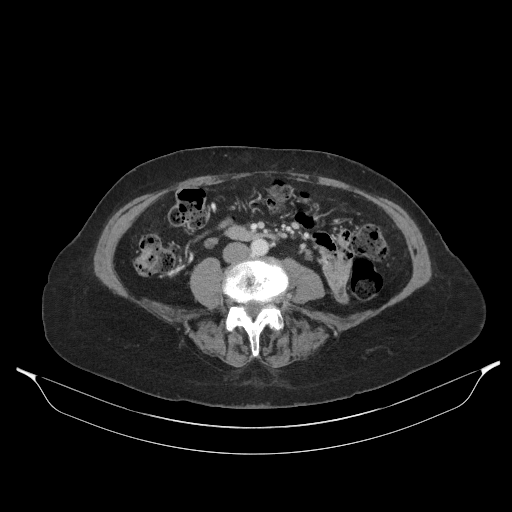
[im 88/144  bone]
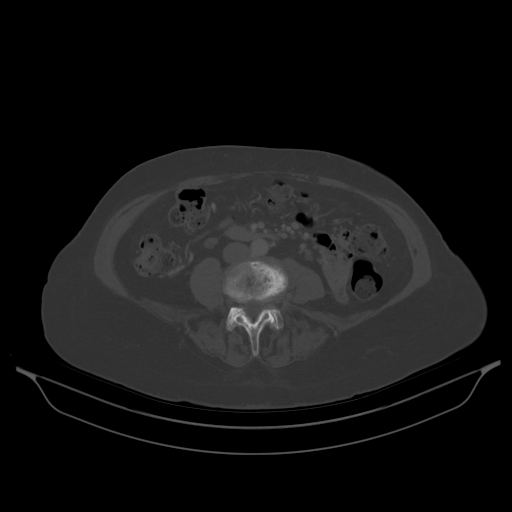
[im 97/144  soft-tissue]
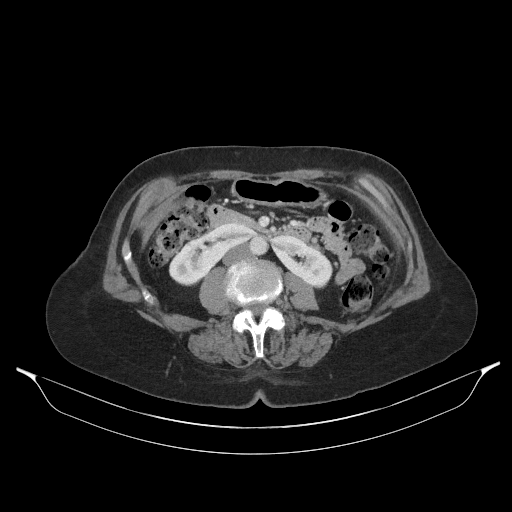
[im 107/144  soft-tissue]
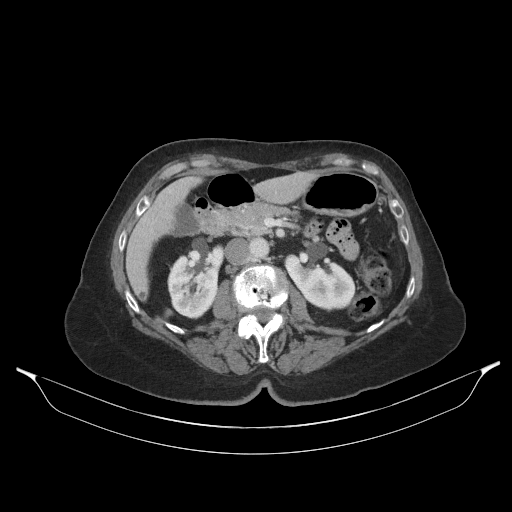
[im 116/144  soft-tissue]
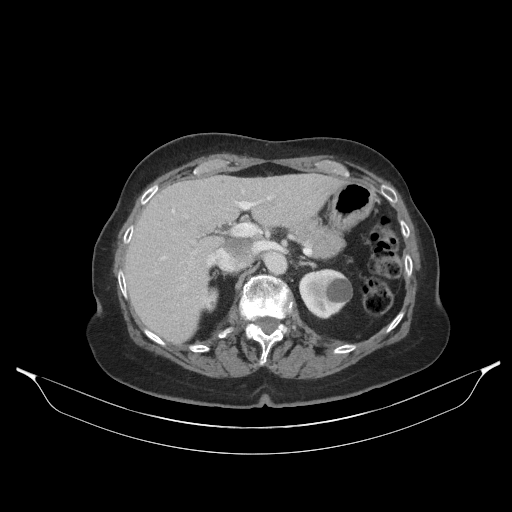
[im 125/144  soft-tissue]
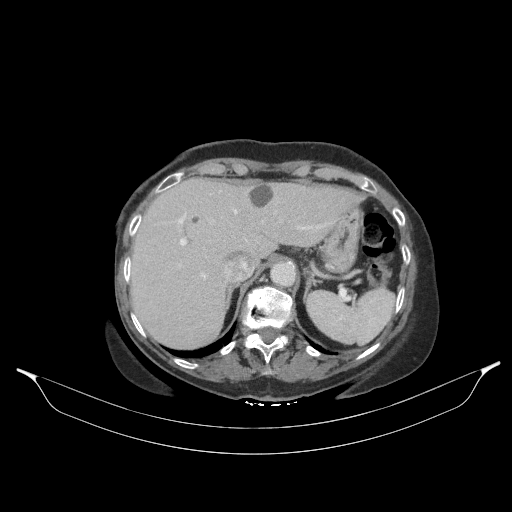
[im 134/144  soft-tissue]
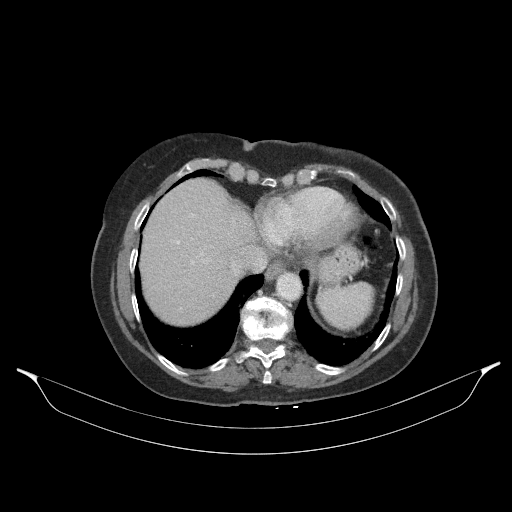

[Series 5: abd/pel cor w · coronal · 0.88mm/px · 3 of 122 slices shown]
[im 41/122  soft-tissue]
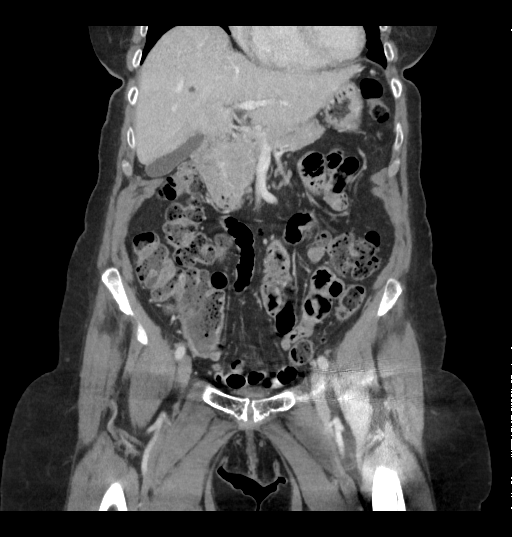
[im 54/122  soft-tissue]
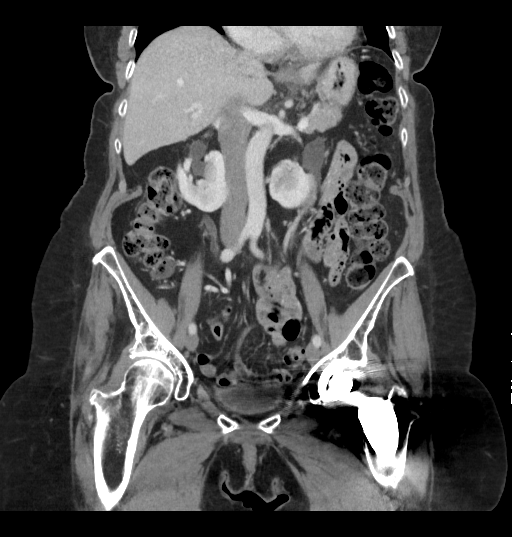
[im 68/122  soft-tissue]
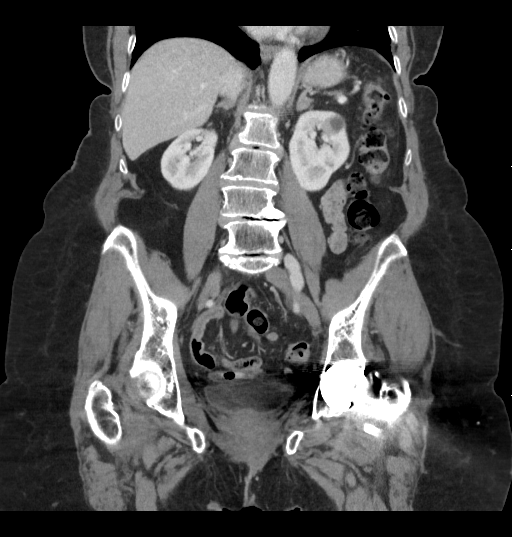

[17 of 46 positions shown; findings below may reference images not displayed]

FINDINGS: The lung bases are clear. Cysts are seen within the liver. The 
liver, 
spleen, pancreas and adrenal glands are normal in appearance. Cyst are seen in 
the kidneys bilaterally. There is a horseshoe kidney with a very small isthmus 
attaching the inferior pole of the kidneys. No adenopathy seen. No 
hydronephrosis. Normal appendix. Postop changes left hip. The bladder is 
unremarkable in appearance. Exact etiology of the chronic pain and cramping 
not 
readily apparent on this exam. No hernia seen.
IMPRESSION: Horseshoe kidney with small cysts. 
Atherosclerotic changes and degenerative changes. Normal appendix. 
Postop changes. 
RADIATION DOSE REDUCTION: All CT scans are performed using radiation dose 
reduction techniques, when applicable.  Technical factors are evaluated and 
adjusted to ensure appropriate moderation of exposure.  Automated dose 
management technology is applied to adjust the radiation doses to minimize 
exposure while achieving diagnostic quality images.

## 2017-12-18 ENCOUNTER — Ambulatory Visit: Payer: MEDICARE | Primary: Family Medicine

## 2018-01-14 ENCOUNTER — Ambulatory Visit: Payer: MEDICARE | Primary: Family Medicine

## 2018-01-16 MED ORDER — LEVOTHYROXINE SODIUM 75 MCG PO TABS
75 MCG | ORAL_TABLET | ORAL | 0 refills | Status: DC
Start: 2018-01-16 — End: 2018-04-18

## 2018-02-14 ENCOUNTER — Encounter

## 2018-02-14 MED ORDER — GABAPENTIN 300 MG PO CAPS
300 MG | ORAL_CAPSULE | ORAL | 1 refills | Status: DC
Start: 2018-02-14 — End: 2019-02-05

## 2018-02-19 ENCOUNTER — Ambulatory Visit: Payer: MEDICARE | Primary: Family Medicine

## 2018-02-19 ENCOUNTER — Encounter

## 2018-02-19 ENCOUNTER — Inpatient Hospital Stay: Admit: 2018-02-19 | Payer: MEDICARE | Primary: Family Medicine

## 2018-02-19 DIAGNOSIS — Z853 Personal history of malignant neoplasm of breast: Secondary | ICD-10-CM

## 2018-02-19 DIAGNOSIS — N63 Unspecified lump in unspecified breast: Secondary | ICD-10-CM

## 2018-03-12 ENCOUNTER — Encounter

## 2018-03-12 ENCOUNTER — Inpatient Hospital Stay: Admit: 2018-03-12 | Payer: MEDICARE | Primary: Family Medicine

## 2018-03-12 ENCOUNTER — Inpatient Hospital Stay: Payer: MEDICARE | Primary: Family Medicine

## 2018-03-12 DIAGNOSIS — M79642 Pain in left hand: Secondary | ICD-10-CM

## 2018-04-18 MED ORDER — LEVOTHYROXINE SODIUM 75 MCG PO TABS
75 MCG | ORAL_TABLET | ORAL | 0 refills | Status: DC
Start: 2018-04-18 — End: 2018-05-22

## 2018-04-21 NOTE — Telephone Encounter (Signed)
[phone] (home)  - Left msg for Prentiss to return call to scheduled appt.

## 2018-04-25 ENCOUNTER — Ambulatory Visit: Admit: 2018-04-25 | Discharge: 2018-04-25 | Payer: MEDICARE | Attending: Family Medicine | Primary: Family Medicine

## 2018-04-25 DIAGNOSIS — I10 Essential (primary) hypertension: Secondary | ICD-10-CM

## 2018-04-25 NOTE — Patient Instructions (Signed)
Consider getting the new shingle vaccine  See in 6 months

## 2018-04-25 NOTE — Progress Notes (Signed)
Subjective:      Patient ID: Jodi Walls is a 77 y.o. female.    Patient presents with:  Hypertension    See pain management for feet pain  She brings in a list of labs that have been done  From Jodi Walls pain management  See Jodi Jodi Walls for renal  See heme onc  See Jodi Walls    Overall well      Date of Birth:  [birth year]    Date of Visit:  04/25/2018     -- Pcn (Penicillins) -- Swelling    --  Face and hands swelled   -- Ace Inhibitors -- Other (See Comments)    --  cough   -- Atorvastatin -- Other (See Comments)    --  Muscle aches   -- Zithromax (Azithromycin) -- Swelling   -- Codeine -- Nausea And Vomiting    Current Outpatient Medications:  HYDROcodone-acetaminophen (NORCO) 5-325 MG per tablet, TK 1 T PO  Q 12 HOURS, Disp: , Rfl:   levothyroxine (SYNTHROID) 75 MCG tablet, TAKE 1 TABLET BY MOUTH DAILY, Disp: 30 tablet, Rfl: 0  gabapentin (NEURONTIN) 300 MG capsule, TAKE ONE CAPSULE BY MOUTH EVERY DAY WITH SUPPER, Disp: 90 capsule, Rfl: 1  FLUZONE HIGH-DOSE 0.5 ML SUSY injection, ADM 0.5ML IM UTD, Disp: , Rfl: 0  furosemide (LASIX) 40 MG tablet, Take 1 tablet by mouth See Admin Instructions One daily prn edema, Disp: 90 tablet, Rfl: 1  omeprazole (PRILOSEC) 20 MG delayed release capsule, TAKE ONE CAPSULE BY MOUTH EVERY DAY, Disp: 90 capsule, Rfl: 0  potassium chloride (KLOR-CON M) 10 MEQ extended release tablet, Take 1 tablet by mouth daily (Patient taking differently: Take 10 mEq by mouth daily as needed (use when you take the water pill) ), Disp: 60 tablet, Rfl: 1  melatonin 3 MG TABS tablet, Take 3 mg by mouth nightly , Disp: , Rfl:   aspirin 81 MG tablet, Take 81 mg by mouth daily., Disp: , Rfl:     No current facility-administered medications for this visit.       --------------------------               04/25/18                     1401        --------------------------   BP:          136/72        Site:    Left Upper Arm    Position:    Sitting        Cuff Size:  Large Adult       Pulse:         64          Resp:          16          SpO2:         98%          Weight: 195 lb (88.5 kg)   Height: 5\' 1"  (1.549 m)   --------------------------  Body mass index is 36.84 kg/m.     Wt Readings from Last 3 Encounters:    She has intentionally lost some weight. Just watching the diet  04/25/18 : 195 lb (88.5 kg)  03/04/17 : 204 lb (92.5 kg)  11/14/16 : 211 lb (95.7 kg)    BP Readings from Last 3 Encounters:  04/25/18 : 136/72  03/04/17 : 138/84  11/14/16 :  138/82          Review of Systems   Constitutional: Negative for appetite change, chills, fever and unexpected weight change.   Respiratory: Negative for cough and shortness of breath.         She gets doe not new and she see's Jodi Jodi Walls cardiology     Cardiovascular: Negative for chest pain, palpitations and leg swelling.   Gastrointestinal: Negative for abdominal distention, abdominal pain, blood in stool, constipation, diarrhea, nausea and vomiting.   Genitourinary: Negative for difficulty urinating, dysuria, frequency and hematuria.   Neurological: Negative for headaches.       Objective:   Physical Exam  Constitutional:       General: She is not in acute distress.     Appearance: She is well-developed. She is not diaphoretic.   Eyes:      General: No scleral icterus.  Neck:      Musculoskeletal: Neck supple.      Thyroid: No thyroid mass or thyromegaly.   Cardiovascular:      Rate and Rhythm: Normal rate and regular rhythm.      Heart sounds: Normal heart sounds. No murmur. No friction rub. No gallop.    Pulmonary:      Effort: Pulmonary effort is normal. No tachypnea, accessory muscle usage or respiratory distress.      Breath sounds: Normal breath sounds. No decreased breath sounds, wheezing, rhonchi or rales.   Lymphadenopathy:      Cervical: No cervical adenopathy.      Upper Body:      Right upper body: No supraclavicular adenopathy.      Left upper body: No supraclavicular adenopathy.   Skin:     General: Skin is warm and  dry.      Coloration: Skin is not pale.   Neurological:      Mental Status: She is alert.         Assessment:        Diagnosis Orders   1. Essential hypertension, benign     2. Acquired hypothyroidism         The blood testing is fine  No change  She wants to not treat the lipid and she wants to use the diet          Plan:      Consider getting the new shingle vaccine  See in 6 months        Jodi Candy, MD

## 2018-05-23 MED ORDER — LEVOTHYROXINE SODIUM 75 MCG PO TABS
75 MCG | ORAL_TABLET | ORAL | 3 refills | Status: DC
Start: 2018-05-23 — End: 2018-10-02

## 2018-07-03 IMAGING — US ULTRASOUND GUIDED THYROID CORE BIOPSY
1 series · 3 of 3 positions shown · non-contrast
Comparison: none

ULTRASOUND GUIDED THYROID CORE BIOPSY, 07/03/2018 [DATE]: 
CLINICAL INDICATION: The patient has a left thyroid lesion and presents for 
ultrasound-guided biopsy. 
The entire procedure was explained to the patient in detail. Potential 
complications such as infection, hemorrhage, and the potential need for surgical 
intervention were all discussed. All questions were answered and the patient 
signed their consent form. 
The patient's left neck was prepped and draped in a sterile fashion. 5 cc of 2% 
lidocaine solution was used for local anesthetic with 5 cc wasted. 
An 18-gauge Majela Abel biopsy gun was inserted under ultrasound guidance into the 
left thyroid lesion and an 18-gauge core biopsy was obtained. This was repeated 
2 more times. The pathologist verified the adequacy of the samples. One 
ultrasound image was obtained.

[Series 2: vascular · 3 of 3 slices shown]
[im 1/3]
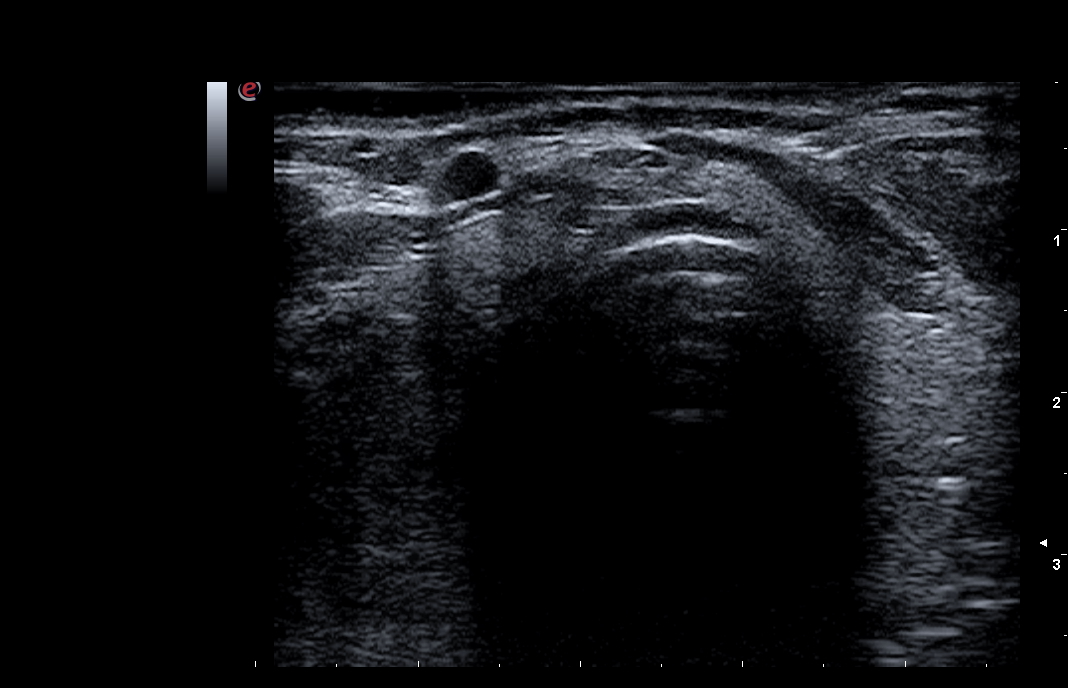
[im 2/3]
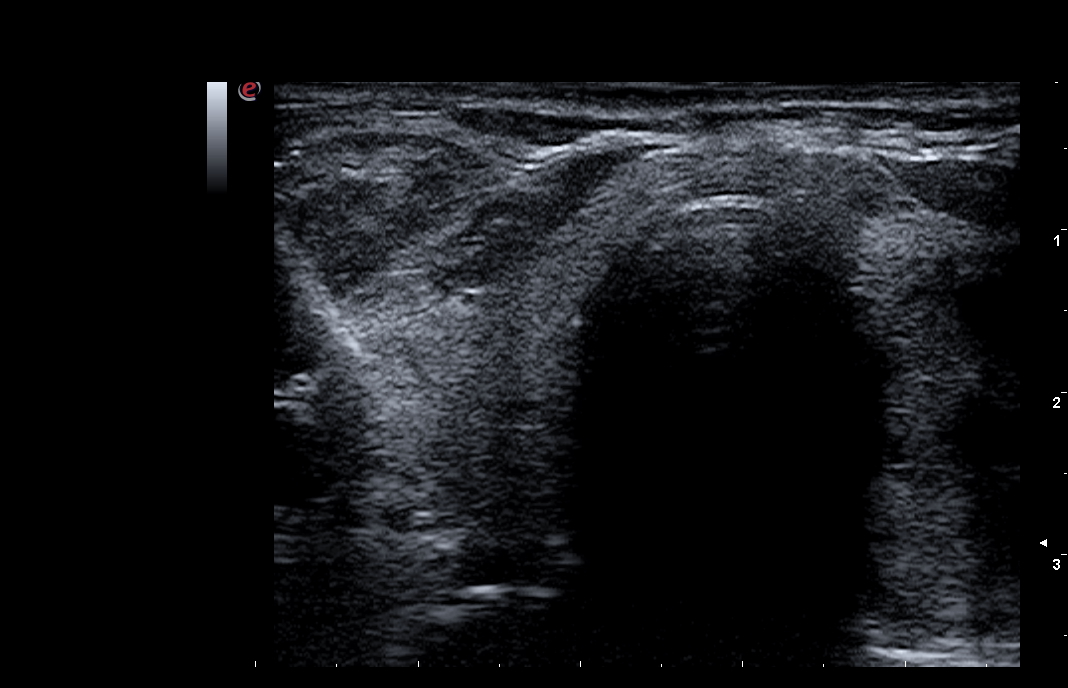
[im 3/3]
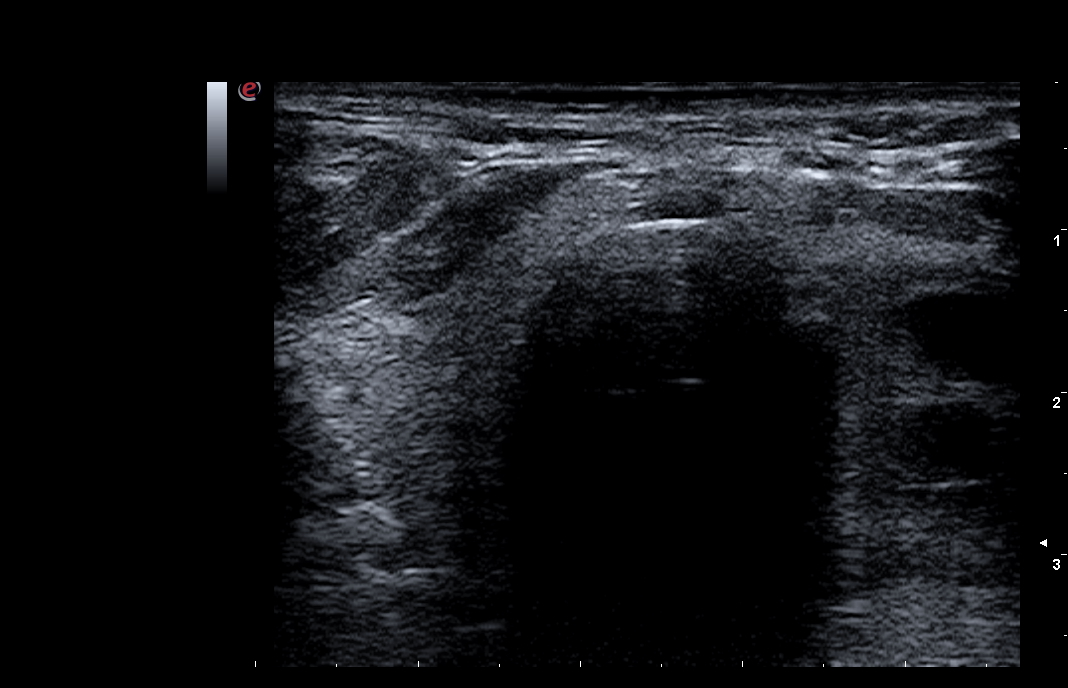

[3 of 3 positions shown; findings below may reference images not displayed]

IMPRESSION: 1. Successful ultrasound-guided biopsy of a left thyroid lesion without 
complication.

## 2018-10-03 MED ORDER — LEVOTHYROXINE SODIUM 75 MCG PO TABS
75 MCG | ORAL_TABLET | ORAL | 3 refills | Status: DC
Start: 2018-10-03 — End: 2019-02-05

## 2018-10-24 ENCOUNTER — Encounter

## 2018-10-24 ENCOUNTER — Ambulatory Visit: Admit: 2018-10-24 | Discharge: 2018-10-24 | Payer: MEDICARE | Attending: Family Medicine | Primary: Family Medicine

## 2018-10-24 DIAGNOSIS — M2560 Stiffness of unspecified joint, not elsewhere classified: Secondary | ICD-10-CM

## 2018-10-24 NOTE — Patient Instructions (Signed)
Do follow up with dr Tamala Julian the cardiologist on a regular visit   Do the blood testing  See me back in about 3 months to recheck the weight   Do see the rheumatology doctor

## 2018-10-24 NOTE — Progress Notes (Signed)
 Subjective:      Patient ID: Jodi Walls is a 78 y.o. female.    Patient presents with:  6 Month Follow-Up: hypertension, thyroid    She is well and really no c/o    Her mother had severe arthritis     meds same    Date of Birth:  Jun 02, 1940    Date of Visit:  10/24/2018     -- Pcn (Penicillins) -- Swelling    --  Face and hands swelled   -- Ace Inhibitors -- Other (See Comments)    --  cough   -- Atorvastatin -- Other (See Comments)    --  Muscle aches   -- Zithromax (Azithromycin) -- Swelling   -- Codeine -- Nausea And Vomiting    Current Outpatient Medications:  levothyroxine  (SYNTHROID ) 75 MCG tablet, TAKE 1 TABLET BY MOUTH DAILY, Disp: 30 tablet, Rfl: 3  HYDROcodone -acetaminophen  (NORCO) 5-325 MG per tablet, TK 1 T PO  Q 12 HOURS, Disp: , Rfl:   furosemide  (LASIX ) 40 MG tablet, Take 1 tablet by mouth See Admin Instructions One daily prn edema, Disp: 90 tablet, Rfl: 1  omeprazole  (PRILOSEC) 20 MG delayed release capsule, TAKE ONE CAPSULE BY MOUTH EVERY DAY, Disp: 90 capsule, Rfl: 0  potassium chloride  (KLOR-CON  M) 10 MEQ extended release tablet, Take 1 tablet by mouth daily (Patient taking differently: Take 10 mEq by mouth daily as needed (use when you take the water pill) ), Disp: 60 tablet, Rfl: 1  melatonin 3 MG TABS tablet, Take 3 mg by mouth nightly , Disp: , Rfl:   aspirin  81 MG tablet, Take 81 mg by mouth daily., Disp: , Rfl:   gabapentin  (NEURONTIN ) 300 MG capsule, TAKE ONE CAPSULE BY MOUTH EVERY DAY WITH SUPPER, Disp: 90 capsule, Rfl: 1  FLUZONE HIGH-DOSE 0.5 ML SUSY injection, ADM 0.5ML IM UTD, Disp: , Rfl: 0    No current facility-administered medications for this visit.       ---------------------------               10/24/18                      1333         ---------------------------   BP:          134/84         Site:    Left Upper Arm     Position:     Sitting        Cuff Size:   Large Adult      Pulse:         76           Temp:   97.4 F (36.3 C)   TempSrc:       Oral          Weight: 191 lb (86.6 kg)    Height:  5' 1 (1.549 m)   ---------------------------  Body mass index is 36.09 kg/m.     Wt Readings from Last 3 Encounters:   She has tried to lose by not eating as much  10/24/18 : 191 lb (86.6 kg)  04/25/18 : 195 lb (88.5 kg)  03/04/17 : 204 lb (92.5 kg)    BP Readings from Last 3 Encounters:  10/24/18 : 134/84  04/25/18 : 136/72  03/04/17 : 138/84        Review of Systems   Constitutional: Negative for appetite change, chills, fever and unexpected weight change.  Respiratory: Negative for cough and shortness of breath.         Hx of doe if does a lot of walking   Cardiovascular: Negative for chest pain, palpitations and leg swelling.        Occ palpitation. She is due to see dr Felipe Horton cardiology   Gastrointestinal: Negative for abdominal pain, blood in stool, constipation, diarrhea, nausea and vomiting.        No dysphagia no gerd    Genitourinary: Negative for difficulty urinating, dysuria and hematuria.        Recently seen dr Gerhard Knuckles   Musculoskeletal:        Joint pain in the hands and some joint swelling. She is stiff in the am few hours. Since October    Neurological: Negative for dizziness and headaches.       Objective:   Physical Exam  Constitutional:       General: She is not in acute distress.     Appearance: Normal appearance. She is well-developed. She is not ill-appearing or diaphoretic.   HENT:      Head: Normocephalic and atraumatic.   Eyes:      General: No scleral icterus.  Neck:      Musculoskeletal: Neck supple.      Thyroid: No thyroid mass or thyromegaly.   Cardiovascular:      Rate and Rhythm: Normal rate and regular rhythm.      Heart sounds: Normal heart sounds. No murmur. No friction rub. No gallop.       Comments:     Pulmonary:      Effort: Pulmonary effort is normal. No tachypnea, accessory muscle usage or respiratory distress.      Breath sounds: Normal breath sounds. No decreased breath sounds, wheezing, rhonchi or rales.    Abdominal:      General: Bowel sounds are normal. There is no distension or abdominal bruit.      Palpations: Abdomen is soft. There is no hepatomegaly, splenomegaly, mass or pulsatile mass.      Tenderness: There is no abdominal tenderness. There is no guarding.   Musculoskeletal:      Comments: Mild puffy about the left mtp joints and both wrists  No marks no increase in warmth   Lymphadenopathy:      Cervical: No cervical adenopathy.      Upper Body:      Right upper body: No supraclavicular adenopathy.      Left upper body: No supraclavicular adenopathy.   Skin:     General: Skin is warm and dry.      Coloration: Skin is not pale.      Nails: There is no clubbing.     Neurological:      General: No focal deficit present.      Mental Status: She is alert.         Assessment:        Diagnosis Orders   1. Stiffness in joint  Sedimentation Rate    Rheumatoid Factor   2. Bilateral hand pain  Sedimentation Rate    Rheumatoid Factor   3. Essential hypertension, benign     4. Acquired hypothyroidism  TSH without Reflex    T4, Free       Bmp and cbc nml on 08/15/18 at [facility]      Plan:      Do follow up with dr Felipe Horton the cardiologist on a regular visit   Do the blood testing  See me  back in about 3 months to recheck the weight   Do see the rheumatology doctor         Orysia Blas, MD

## 2018-10-25 LAB — TSH: TSH: 2.71 u[IU]/mL (ref 0.27–4.20)

## 2018-10-25 LAB — SEDIMENTATION RATE: Sed Rate, Automated: 16 mm/h (ref 0–30)

## 2018-10-25 LAB — T4, FREE: T4 Free: 1.1 ng/dL (ref 0.9–1.8)

## 2018-10-25 LAB — RHEUMATOID FACTOR: Rheumatoid Factor: <10 IU/mL (ref <14)

## 2018-11-10 ENCOUNTER — Ambulatory Visit: Admit: 2018-11-10 | Discharge: 2018-11-10 | Payer: MEDICARE | Attending: Internal Medicine | Primary: Family Medicine

## 2018-11-10 DIAGNOSIS — M159 Polyosteoarthritis, unspecified: Secondary | ICD-10-CM

## 2018-11-10 DIAGNOSIS — M8949 Other hypertrophic osteoarthropathy, multiple sites: Secondary | ICD-10-CM

## 2018-11-10 MED ORDER — PREDNISONE 5 MG PO TABS
5 MG | ORAL_TABLET | Freq: Two times a day (BID) | ORAL | 0 refills | Status: AC
Start: 2018-11-10 — End: 2018-12-01

## 2018-11-10 NOTE — Progress Notes (Signed)
11/10/2018  Patient Name: Jodi Walls  DOB: [birth year]  MEDICAL RECORD [id number]    MEDICATIONS  Current Outpatient Medications   Medication Sig Dispense Refill   . predniSONE (DELTASONE) 5 MG tablet Take 1 tablet by mouth 2 times daily for 21 days 42 tablet 0   . levothyroxine (SYNTHROID) 75 MCG tablet TAKE 1 TABLET BY MOUTH DAILY 30 tablet 3   . gabapentin (NEURONTIN) 300 MG capsule TAKE ONE CAPSULE BY MOUTH EVERY DAY WITH SUPPER (Patient taking differently: Take 300 mg by mouth nightly. ) 90 capsule 1   . FLUZONE HIGH-DOSE 0.5 ML SUSY injection ADM 0.5ML IM UTD  0   . furosemide (LASIX) 40 MG tablet Take 1 tablet by mouth See Admin Instructions One daily prn edema 90 tablet 1   . omeprazole (PRILOSEC) 20 MG delayed release capsule TAKE ONE CAPSULE BY MOUTH EVERY DAY 90 capsule 0   . potassium chloride (KLOR-CON M) 10 MEQ extended release tablet Take 1 tablet by mouth daily (Patient taking differently: Take 10 mEq by mouth daily as needed (use when you take the water pill) ) 60 tablet 1   . melatonin 3 MG TABS tablet Take 3 mg by mouth nightly      . aspirin 81 MG tablet Take 81 mg by mouth daily.       No current facility-administered medications for this visit.        ALLERGIES  Allergies   Allergen Reactions   . Pcn [Penicillins] Swelling     Face and hands swelled   . Ace Inhibitors Other (See Comments)     cough   . Atorvastatin Other (See Comments)     Muscle aches   . Zithromax [Azithromycin] Swelling   . Codeine Nausea And Vomiting         Comments  No specialty comments available.  REFERRING PHYSICIAN:Perry Jacelyn Grip, MD      HISTORY OF PRESENT ILLNESS  Jodi Walls is a 78 y.o. female breast cancer status post lumpectomy, hypertension, osteoarthritis, hypothyroidism, CKD 3 who is being seen for follow up evaluation of  joint pain.  She is complaining of pain in her hands and feet.  We started 2 years ago.  Symptoms have progressively worsened over the past 1 year.  She has been on daily basis, 10 out  of 10.  She is swelling in her knuckles and feet.  She has morning stiffness lasting for 1 hour.  She has difficulty opening doorknobs and jars.  She has developed weakness in her hands and is dropping things.  She takes Tylenol 1000 mg twice a day with minimal relief.  She also takes Aleve as needed.  She is unable to take oral NSAIDs due to CKD 3.  She has a history of left knee replacement.  She has osteoarthritis in the right knee as well.  Right knee cracks and pops.  Pain in the right knee gets worse going up and down the steps and standing from the sitting position.  She had a trauma to the left hand in October 2019.  Since then left third MCP joint stays swollen and painful.  She had x-ray of the left hand which did not show any fracture.  She denies psoriasis, inflammatory bowel disease, inflammatory back pain, dactylitis, enthesitis, tenosynovitis or uveitis.  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  Her mother had arthritis  She had work-up with PCP which showed negative RF, ESR  and normal TSH.  HPI  Review of Systems    REVIEW OF SYSTEMS:   Constitutional: No unanticipated weight loss or fevers.   Integumentary: No rash, photosensitivity, malar rash, livedo reticularis, alopecia and Raynaud's symptoms, sclerodactyly, skin tightening  Eyes: negative for visual disturbance and persistent redness, discharge from eyes   ENT: - No tinnitus, loss of hearing, vertigo, or recurrent ear infections.  - No history of nasal/oral ulcers.  - No history of dry eyes/dry mouth  Cardiovascular: No history of pericarditis, chest pain or murmur or palpitations  Respiratory: No shortness of breath, cough or history of interstitial lung disease.No history of pleurisy. No history of tuberculosis or atypical infections.  Gastrointestinal: No history of heart burn, dysphagia or esophageal dysmotility. No change in bowel habits or any inflammatory bowel disease.  Genitourinary: No  history miscarriages.  Hematologic/Lymphatic: No abnormal bruising or bleeding, blood clots or swollen lymph nodes.  Neurological: No history of headaches, seizure or focal weakness. No history of neuropathies, paresthesias or hyperesthesias, facial droop, diplopia  Psychiatric: No history of bipolar disease, anxiety, depression  Endocrine: Denies any polyuria, polydipsia and osteoporosis  Allergic/Immunologic: No nasal congestion or hives.        I have reviewed patients Past medical History, Social History and Family History as mentioned in her chart and this remains unchanged fromprevious.    Past Medical History:   Diagnosis Date   . Anemia    . Arthritis    . Cancer ([place]) 1995    hx of breast cancer--no chemo or radiation needed   . GERD (gastroesophageal reflux disease)    . Hx of blood clots 2001    after gallbladder surgery--blood clot in arm where IV had been   . Hypothyroidism    . Kidney failure     stage 3   . PONV (postoperative nausea and vomiting)    . Wears glasses     reading     Past Surgical History:   Procedure Laterality Date   . APPENDECTOMY     . BACK SURGERY      r/t to herniated disc in lower back, neck area--screws and rods in place   . BREAST BIOPSY      left breast biopsy   . BREAST SURGERY  1995    right mastectomy   . CHOLECYSTECTOMY  2001   . COLONOSCOPY  04/12/2015    Esophagogastroduodenoscopy with biopsy, Colonoscopy, diagnostic. no findings except for diverticula and check in 10 years   . HYSTERECTOMY      TAH-BSO. no  cancer   . JOINT REPLACEMENT Left     left total knee replacement   . LIPOMA RESECTION      right side below right breast   . MASTECTOMY, RADICAL  1995   . UPPER GASTROINTESTINAL ENDOSCOPY  04/12/2015    and colonoscopy. hiatal hernia and gastritis     Social History     Socioeconomic History   . Marital status: Widowed     Spouse name: Not on file   . Number of children: Not on file   . Years of education: Not on file   . Highest education level: Not on file    Occupational History   . Not on file   Social Needs   . Financial resource strain: Not on file   . Food insecurity     Worry: Not on file     Inability: Not on file   . Transportation needs  Medical: Not on file     Non-medical: Not on file   Tobacco Use   . Smoking status: Never Smoker   . Smokeless tobacco: Never Used   Substance and Sexual Activity   . Alcohol use: Yes     Comment: social-rare   . Drug use: No   . Sexual activity: Not Currently   Lifestyle   . Physical activity     Days per week: Not on file     Minutes per session: Not on file   . Stress: Not on file   Relationships   . Social Product manager on phone: Not on file     Gets together: Not on file     Attends religious service: Not on file     Active member of club or organization: Not on file     Attends meetings of clubs or organizations: Not on file     Relationship status: Not on file   . Intimate partner violence     Fear of current or ex partner: Not on file     Emotionally abused: Not on file     Physically abused: Not on file     Forced sexual activity: Not on file   Other Topics Concern   . Not on file   Social History Narrative   . Not on file     Family History   Problem Relation Age of Onset   . Cancer Mother         lung    . Cancer Father         bladder   . Heart Disease Father    . High Blood Pressure Father    . Cancer Maternal Aunt         ovarian   . Cancer Paternal Cousin         ovarian         PHYSICAL EXAM   Vitals:    11/10/18 1016   BP: (!) 144/86   Temp: 97.7 F (36.5 C)   Weight: 192 lb (87.1 kg)   Height: '5\' 1"'$  (1.549 m)     Physical Exam  Constitutional:  Well developed, well nourished, no acute distress, non-toxic appearance   Musculoskeletal:    RIGHT  Swell  Tender  ROM  LEFT  Swell  Tender  ROM    DIP2  0  0   [place]  0  0   [place]   DIP3  0  0   [place]  0  0   [place]   DIP4  0  0   [place]  0  0   [place]   DIP5  0  0   [place]  0  0   [place]   PIP1  0  0   bony change  0  0   bony change    PIP2  0  0  FULL   0  0  FULL    PIP3  0  0  FULL   0  0  FULL    PIP4  0  0  FULL   0  0  FULL    PIP5  0  0  FULL   0  0  FULL    MCP1  0  0   bony change  0  0   bony change   MCP2  0  0   bony change  0  0  bony change   MCP3  0  0   bony change  0  0   bony change+++   MCP4  0  0  FULL   0  0  FULL    MCP5  0  0  FULL   0  0  FULL    Wrist  0  0  FULL   0  0  FULL    Elbow  0  0  FULL   0  0  FULL    Shouldr  0  0  FULL   0  0  FULL    Hip  0  0  FULL   0  0  FULL    Knee  0  0  CREPITUS/VARUS   0  0  TKR   Ankle  0  0  FULL   0  0  FULL    MTP1  0  + FULL   0  + FULL    MTP2  0  + FULL   0  +  FULL    MTP3  0  + FULL   0  +  FULL    MTP4  0  + FULL   0  +  FULL    MTP5  0  + FULL   0  +  FULL    IP1  0  0  FULL   0  0  FULL    IP2  0  0  FULL   0  0  FULL    IP3  0  0  FULL   0  0  FULL    IP4  0  0  FULL   0  0  FULL    IP5  0  0  FULL   0 0 FULL     Squaring and bony enlargement of bilateral CMC joints  Ambulates without assistance, normal gait  Neck: Full ROM, no tenderness,supple   Back-  Tenderness++  Eyes:  PERRL, extra ocular movements intact, conjunctiva normal   HEENT:  Atraumatic, normocephalic, external ears normal, oropharynx moist, no pharyngeal exudates.   Respiratory:  No respiratory distress  GI:  Soft, nondistended, normal bowel sounds, nontender, noorganomegaly, no mass, no rebound, no guarding   GU:  No costovertebral angle tenderness   Integument:  Well hydrated, no rash or telangiectasias  Lymphatic:  No lymphadenopathy noted   Neurologic:   Alert & oriented x 3, CN 2-12 normal, no focal deficits noted. Sensations Intact. Muscle strength 5/5 proximallyand distally in upper and lower extremities.   Psychiatric:  Speech and behavior appropriate           LABS AND IMAGING  Outside data reviewed and in HPI    Lab Results   Component Value Date    WBC 6.0 10/26/2015    WBC 5.2 04/12/2015    RBC 4.72 10/26/2015    HGB 14.0 10/26/2015    HCT 42.9 10/26/2015    PLT 261 10/26/2015    MCV 90.8  10/26/2015    MCH 29.7 10/26/2015    MCHC 32.6 10/26/2015    RDW 13.5 10/26/2015    SEGSPCT 60.4 02/02/2010    LYMPHOPCT 24.7 10/26/2015    MONOPCT 7.5 04/12/2015    EOSPCT 4.3 02/02/2010    BASOPCT 0.8 04/12/2015    MONOSABS 0.4 04/12/2015    LYMPHSABS 1.2 04/12/2015    EOSABS 0.1 04/12/2015    BASOSABS 0.0 04/12/2015  DIFFTYPE Auto 02/02/2010       Chemistry        Component Value Date/Time    NA 141 03/04/2017 1138    K 4.6 03/04/2017 1138    CL 103 03/04/2017 1138    CO2 22 03/04/2017 1138    BUN 23 (H) 03/04/2017 1138    CREATININE 1.1 03/04/2017 1138        Component Value Date/Time    CALCIUM 9.9 03/04/2017 1138    ALKPHOS 92 03/04/2017 1138    AST 18 03/04/2017 1138    ALT 14 03/04/2017 1138    BILITOT 0.5 03/04/2017 1138          Lab Results   Component Value Date    SEDRATE 16 10/24/2018     No results found for: CRP  No results found for: ANA, ENA, SSA, SSB, C3, C4  Lab Results   Component Value Date    RF <10.0 10/24/2018     No results found for: ANA, ANATITER, ANAINT, PATH  No results found for: DSDNAG, DSDNAIGGIFA  No results found for: SSAROAB, SSALAAB  No results found for: SMAB, RNPAB  No results found for: CENTABIGG  No results found for: C3, C4, ACE  Lab Results   Component Value Date    VITD25 27.1 05/27/2014     No results found for: Thayer Ohm  Lab Results   Component Value Date    VITAMINB12 457 06/16/2014     Lab Results   Component Value Date    TSH 2.71 10/24/2018     Lab Results   Component Value Date    VITD25 27.1 (L) 05/27/2014       ASSESSMENT AND PLAN      Assessment/Plan:      ASSESSMENT:    1. Primary osteoarthritis involving multiple joints    2. Swelling of hand joint, left    3. Numbness and tingling of upper and lower extremities of both sides        PLAN:     Jodi Walls was seen today for establish care.    Diagnoses and all orders for this visit:    Primary osteoarthritis involving multiple joints-Osteoarthritis/ Joint pain : Discussed about diagnosis and management of  osteoarthritis. There are no FDA approved disease modifying agents. Goal is to control pain and increase mobility. Discussed the importance of wt loss, exercises, formal PT, joint braces/splints. First line of agent Tylenol arthritis, NSAIDs systemic and/ or topical,Cymbalta, pain meds, joint injections- steroids, and visco supplementaiton for knee OA. Also discussed about surgical options.  -Joint symptoms most likely secondary to osteoarthritis  RF, ESR normal  I will do work-up to completely rule out rheumatoid arthritis/systemic rheumatic disease  I will obtain bilateral hand, foot x-rays  Advised to avoid NSAIDs due to CKD  Will start prednisone 5 mg twice a day for 3 weeks and reevaluate  -     CBC Auto Differential; Future  -     Comprehensive Metabolic Panel; Future  -     C-Reactive Protein; Future  -     Cyclic Citrul Peptide Antibody, IgG; Future  -     Hepatitis B Core Antibody, IgM; Future  -     Hepatitis B Surface Antigen; Future  -     Hepatitis C Antibody; Future  -     XR HAND RIGHT (MIN 3 VIEWS); Future  -     XR HAND LEFT (MIN 3 VIEWS); Future  -  XR FOOT RIGHT (MIN 3 VIEWS); Future  -     XR FOOT LEFT (MIN 3 VIEWS); Future  -     ANA Reflex to Antibody Cascade; Future  -     predniSONE (DELTASONE) 5 MG tablet; Take 1 tablet by mouth 2 times daily for 21 days    Swelling of hand joint, left-status post trauma to the left third MCP joint.  Hand x-ray without any fracture.  Persistent bony swelling in the left third MCP joint since then.  Will obtain MRI of the hand to evaluate further  -     MRI HAND LEFT WO CONTRAST; Future  -     predniSONE (DELTASONE) 5 MG tablet; Take 1 tablet by mouth 2 times daily for 21 days    Numbness and tingling of upper and lower extremities of both sides-tingling and numbness in bilateral upper and lower extremities.  No focal weakness  We will check additional work-up to completely rule out underlying metabolic, myopathy, autoimmune etiology  Will obtain bilateral  lower extremity EMGs  Further management pending test results  -     Vitamin B12; Future  -     Vitamin D 25 Hydroxy; Future  -     FOLATE; Future  -     HEMOGLOBIN A1C; Future  -     Magnesium; Future  -     VITAMIN B1; Future  -     VITAMIN B6; Future  -     [place] - Richardo Hanks, MD (Inpatient and Outpatient EMG), [place]         The patient indicates understanding of these issues and agrees with the plan.      Return in about 3 weeks (around 12/01/2018).      The risks and benefits of my recommendations, as well as other treatment options, benefits and side effects werediscussed with the patient. All questions were answered.    I reviewed patient's history, referral documents and electronic medical records  Copy of consult note is being routedelectronically/faxed to referring physician         ######################################################################    I thank you for giving me theopportunity to participate in Jodi Walls care. If you have any questions or concerns please feel free to contact me. I look forward to following  Jodi Walls along with you.      Electronically signed by: Oletta Lamas, MD, 11/10/2018 10:57 AM    Documentation was done using voice recognition dragon software.  Every effort was made to ensure accuracy;however, inadvertent unintentional computerized transcription errors may be present.

## 2018-11-10 NOTE — Patient Instructions (Addendum)
Labs   xrays   emg  MRI left hand   Start prednisone 5 mg twice a day

## 2018-11-11 ENCOUNTER — Inpatient Hospital Stay: Admit: 2018-11-11 | Payer: MEDICARE | Primary: Family Medicine

## 2018-11-11 ENCOUNTER — Inpatient Hospital Stay: Payer: MEDICARE | Primary: Family Medicine

## 2018-11-11 ENCOUNTER — Encounter

## 2018-11-11 DIAGNOSIS — M150 Primary generalized (osteo)arthritis: Secondary | ICD-10-CM

## 2018-11-11 DIAGNOSIS — M159 Polyosteoarthritis, unspecified: Secondary | ICD-10-CM

## 2018-11-11 DIAGNOSIS — M8949 Other hypertrophic osteoarthropathy, multiple sites: Secondary | ICD-10-CM

## 2018-11-12 LAB — CBC WITH AUTO DIFFERENTIAL
Basophils %: 0.5 %
Basophils Absolute: 0 10*3/uL (ref 0.0–0.2)
Eosinophils %: 2.2 %
Eosinophils Absolute: 0.1 10*3/uL (ref 0.0–0.6)
Hematocrit: 34.7 % — ABNORMAL LOW (ref 36.0–48.0)
Hemoglobin: 11.3 g/dL — ABNORMAL LOW (ref 12.0–16.0)
Lymphocytes %: 32.6 %
Lymphocytes Absolute: 1.7 10*3/uL (ref 1.0–5.1)
MCH: 29.3 pg (ref 26.0–34.0)
MCHC: 32.7 g/dL (ref 31.0–36.0)
MCV: 89.6 fL (ref 80.0–100.0)
MPV: 10 fL (ref 5.0–10.5)
Monocytes %: 6.6 %
Monocytes Absolute: 0.4 10*3/uL (ref 0.0–1.3)
Neutrophils %: 58.1 %
Neutrophils Absolute: 3.1 10*3/uL (ref 1.7–7.7)
Platelets: 214 10*3/uL (ref 135–450)
RBC: 3.87 M/uL — ABNORMAL LOW (ref 4.00–5.20)
RDW: 14.5 % (ref 12.4–15.4)
WBC: 5.3 10*3/uL (ref 4.0–11.0)

## 2018-11-12 LAB — COMPREHENSIVE METABOLIC PANEL
ALT: 12 U/L (ref 10–40)
AST: 18 U/L (ref 15–37)
Albumin/Globulin Ratio: 1.9 (ref 1.1–2.2)
Albumin: 4.1 g/dL (ref 3.4–5.0)
Alkaline Phosphatase: 78 U/L (ref 40–129)
Anion Gap: 12 (ref 3–16)
BUN: 20 mg/dL (ref 7–20)
CO2: 24 mmol/L (ref 21–32)
Calcium: 9.3 mg/dL (ref 8.3–10.6)
Chloride: 106 mmol/L (ref 99–110)
Creatinine: 0.9 mg/dL (ref 0.6–1.2)
GFR African American: >60 (ref >60)
GFR Non-African American: >60 (ref >60)
Globulin: 2.2 g/dL
Glucose: 100 mg/dL — ABNORMAL HIGH (ref 70–99)
Potassium: 3.8 mmol/L (ref 3.5–5.1)
Sodium: 142 mmol/L (ref 136–145)
Total Bilirubin: 0.4 mg/dL (ref 0.0–1.0)
Total Protein: 6.3 g/dL — ABNORMAL LOW (ref 6.4–8.2)

## 2018-11-12 LAB — MAGNESIUM: Magnesium: 2.1 mg/dL (ref 1.80–2.40)

## 2018-11-12 LAB — VITAMIN B12: Vitamin B-12: 299 pg/mL (ref 211–911)

## 2018-11-12 LAB — HEPATITIS C ANTIBODY: Hep C Ab Interp: NONREACTIVE

## 2018-11-12 LAB — HEPATITIS B CORE ANTIBODY, IGM: Hep B Core Ab, IgM: NONREACTIVE

## 2018-11-12 LAB — HEPATITIS B SURFACE ANTIGEN: Hep B S Ag Interp: NONREACTIVE

## 2018-11-12 LAB — C-REACTIVE PROTEIN: CRP: 1.6 mg/L (ref 0.0–5.1)

## 2018-11-12 LAB — VITAMIN D 25 HYDROXY: Vit D, 25-Hydroxy: 36.9 ng/mL (ref >=30)

## 2018-11-12 NOTE — Telephone Encounter (Signed)
Pt is needing a new rx for Furosemide - bottle was dated from 2017 -       [facility]

## 2018-11-13 LAB — HEMOGLOBIN A1C
Hemoglobin A1C: 5 %
eAG: 96.8 mg/dL

## 2018-11-13 LAB — ANA REFLEX TO ANTIBODY CASCADE: ANA: NEGATIVE

## 2018-11-13 LAB — FOLATE: Folate: 4.92 ng/mL (ref 4.78–24.20)

## 2018-11-13 LAB — CYCLIC CITRUL PEPTIDE ANTIBODY, IGG: CC Peptide,IgG Ab: <0.5 U/mL (ref 0.0–2.9)

## 2018-11-13 MED ORDER — FUROSEMIDE 40 MG PO TABS
40 MG | ORAL_TABLET | ORAL | 0 refills | Status: DC
Start: 2018-11-13 — End: 2019-02-13

## 2018-11-13 NOTE — Telephone Encounter (Signed)
 Done  Orders Placed This Encounter   Medications   . furosemide  (LASIX ) 40 MG tablet     Sig: Take 1 tablet by mouth See Admin Instructions One daily prn edema     Dispense:  90 tablet     Refill:  0

## 2018-11-15 ENCOUNTER — Inpatient Hospital Stay: Admit: 2018-11-15 | Payer: MEDICARE | Primary: Family Medicine

## 2018-11-15 DIAGNOSIS — M25442 Effusion, left hand: Secondary | ICD-10-CM

## 2018-11-15 NOTE — Other (Signed)
Please call the patient and let her know that MRI of the left hand shows mild subluxation with degenerative changes.  I will refer to hand surgeon for evaluation.

## 2018-11-17 LAB — VITAMIN B1: Vitamin B1, Plasma: <2 nmol/L — ABNORMAL LOW (ref 4–15)

## 2018-11-18 ENCOUNTER — Encounter

## 2018-11-20 LAB — VITAMIN B6: Vitamin B6, Plasma: 11 nmol/L — ABNORMAL LOW (ref 20.0–125.0)

## 2018-11-20 NOTE — Other (Signed)
Please call the patient and let her know that her blood work shows low vitamin B1 and vitamin B6 that could be causing tingling and numbness.  All other labs are normal.  I will forward labs to PCP to address

## 2018-11-21 MED ORDER — VITAMIN B-6 50 MG PO TABS
50 | ORAL_TABLET | Freq: Every day | ORAL | 2 refills | Status: DC
Start: 2018-11-21 — End: 2019-05-21

## 2018-11-21 MED ORDER — VITAMIN B-1 100 MG PO TABS
100 | ORAL_TABLET | Freq: Every day | ORAL | 2 refills | Status: DC
Start: 2018-11-21 — End: 2019-05-21

## 2018-11-24 ENCOUNTER — Encounter: Attending: Physician Assistant | Primary: Family Medicine

## 2018-12-02 ENCOUNTER — Telehealth: Admit: 2018-12-02 | Discharge: 2018-12-02 | Payer: MEDICARE | Attending: Internal Medicine | Primary: Family Medicine

## 2018-12-02 DIAGNOSIS — M8949 Other hypertrophic osteoarthropathy, multiple sites: Secondary | ICD-10-CM

## 2018-12-02 DIAGNOSIS — M159 Polyosteoarthritis, unspecified: Secondary | ICD-10-CM

## 2018-12-02 MED ORDER — DICLOFENAC SODIUM 1 % TD GEL
1 % | TRANSDERMAL | 5 refills | Status: DC
Start: 2018-12-02 — End: 2019-12-07

## 2018-12-02 NOTE — Patient Instructions (Addendum)
Tylenol 650 mg every 6 hours, do not exceed 3 grams daily   Diclofenac gel  Paraffin bath wax  Labs

## 2018-12-02 NOTE — Progress Notes (Signed)
12/02/2018   Jodi Walls is a 78 y.o. female evaluated via telephone on 12/02/2018.      Consent:  She and/or health care decision maker is aware that that she may receive a bill for this telephone service, depending on her insurance coverage, and has provided verbal consent to proceed: Yes          I affirm this is a Patient Initiated Episode with a Patient who has not had a related appointment within my department in the past 7 days or scheduled within the next 24 hours.    Patient identification was verified at the start of the visit: Yes    Total Time: minutes: 21-30 minutes    Note: not billable if this call serves to triage the patient into an appointment for the relevant concern      Jodi Walls   Patient Name: Jodi Walls  DOB: 1940/06/19  MEDICAL RECORD [id number]    MEDICATIONS  Current Outpatient Medications   Medication Sig Dispense Refill   . diclofenac sodium (VOLTAREN) 1 % GEL Apply 4 grams to lower extremity joints and 2 grams to upper extremity joints as needed 4 times/day 500 g 5   . vitamin B-1 (THIAMINE) 100 MG tablet Take 1 tablet by mouth daily 30 tablet 2   . vitamin B-6 (PYRIDOXINE) 50 MG tablet Take 1 tablet by mouth daily 30 tablet 2   . furosemide (LASIX) 40 MG tablet Take 1 tablet by mouth See Admin Instructions One daily prn edema 90 tablet 0   . levothyroxine (SYNTHROID) 75 MCG tablet TAKE 1 TABLET BY MOUTH DAILY 30 tablet 3   . gabapentin (NEURONTIN) 300 MG capsule TAKE ONE CAPSULE BY MOUTH EVERY DAY WITH SUPPER (Patient taking differently: Take 300 mg by mouth nightly. ) 90 capsule 1   . FLUZONE HIGH-DOSE 0.5 ML SUSY injection ADM 0.5ML IM UTD  0   . omeprazole (PRILOSEC) 20 MG delayed release capsule TAKE ONE CAPSULE BY MOUTH EVERY DAY 90 capsule 0   . potassium chloride (KLOR-CON M) 10 MEQ extended release tablet Take 1 tablet by mouth daily (Patient taking differently: Take 10 mEq by mouth daily as needed (use when you take the water pill) ) 60 tablet 1   . melatonin 3 MG TABS  tablet Take 3 mg by mouth nightly      . aspirin 81 MG tablet Take 81 mg by mouth daily.       No current facility-administered medications for this visit.        ALLERGIES  Allergies   Allergen Reactions   . Pcn [Penicillins] Swelling     Face and hands swelled   . Ace Inhibitors Other (See Comments)     cough   . Atorvastatin Other (See Comments)     Muscle aches   . Zithromax [Azithromycin] Swelling   . Codeine Nausea And Vomiting         Comments  No specialty comments available.    Background history:  Jodi Walls is a 78 y.o. female breast cancer status post lumpectomy, hypertension, osteoarthritis, hypothyroidism, CKD 3 who is being seen for follow up evaluation of  joint pain.  She is complaining of pain in her hands and feet.  We started 2 years ago.  Symptoms have progressively worsened over the past 1 year.  She has pain on daily basis, 10 out of 10.  She is swelling in her knuckles and feet.  She has morning stiffness lasting  for 1 hour.  She has difficulty opening doorknobs and jars.  She has developed weakness in her hands and is dropping things.  She takes Tylenol 1000 mg twice a day with minimal relief.  She also takes Aleve as needed.  She is unable to take oral NSAIDs due to CKD 3.  She has a history of left knee replacement.  She has osteoarthritis in the right knee as well.  Right knee cracks and pops.  Pain in the right knee gets worse going up and down the steps and standing from the sitting position.  She had a trauma to the left hand in October 2019.  Since then left third MCP joint stays swollen and painful.  She had x-ray of the left hand which did not show any fracture.  She denies psoriasis, inflammatory bowel disease, inflammatory back pain, dactylitis, enthesitis, tenosynovitis or uveitis.  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  Her mother had arthritis  She had work-up with PCP which showed negative RF, ESR and normal TSH.    Interim  history: She presents for follow-up of osteoarthritis, CPPD.  She takes Tylenol 1000 mg twice a day with some relief.  She continues to complain of pain and stiffness in her hands and feet.  Work-up came back negative for rheumatoid arthritis.  Hand and foot x-rays showed moderate to severe degenerative changes with CPPD.  She denies any pseudogout flareups.  She continues to complain of tingling and numbness in hands and feet.  Work-up showed low B1 and B6.  She was placed on replacement.  She is scheduled for EMG  .  She also had MRI of the left hand that showed volar subluxation of the left third MCP joint with degenerative changes.  She is scheduled to see hand surgeon.  She is unable to take oral NSAIDs due to CKD.    HPI  Review of Systems    REVIEW OF SYSTEMS:   Constitutional: No unanticipated weight loss or fevers.   Integumentary: No rash, photosensitivity, malar rash, livedo reticularis, alopecia and Raynaud's symptoms, sclerodactyly, skin tightening  Eyes: negative for visual disturbance and persistent redness, discharge from eyes   ENT: - No tinnitus, loss of hearing, vertigo, or recurrent ear infections.  - No history of nasal/oral ulcers.  - No history of dry eyes/dry mouth  Cardiovascular: No history of pericarditis, chest pain or murmur or palpitations  Respiratory: No shortness of breath, cough or history of interstitial lung disease.No history of pleurisy. No history of tuberculosis or atypical infections.  Gastrointestinal: No history of heart burn, dysphagia or esophageal dysmotility. No change in bowel habits or any inflammatory bowel disease.  Genitourinary: No history miscarriages.  Hematologic/Lymphatic: No abnormal bruising or bleeding, blood clots or swollen lymph nodes.  Neurological: No history of headaches, seizure or focal weakness. No history of neuropathies, paresthesias or hyperesthesias, facial droop, diplopia  Psychiatric: No history of bipolar disease, anxiety,  depression  Endocrine: Denies any polyuria, polydipsia and osteoporosis  Allergic/Immunologic: No nasal congestion or hives.        I have reviewed patients Past medical History, Social History and Family History as mentioned in her chart and this remains unchanged fromprevious.    Past Medical History:   Diagnosis Date   . Anemia    . Arthritis    . Cancer ([place]) 1995    hx of breast cancer--no chemo or radiation needed   . GERD (gastroesophageal reflux disease)    .  Hx of blood clots 2001    after gallbladder surgery--blood clot in arm where IV had been   . Hypothyroidism    . Kidney failure     stage 3   . PONV (postoperative nausea and vomiting)    . Wears glasses     reading     Past Surgical History:   Procedure Laterality Date   . APPENDECTOMY     . BACK SURGERY      r/t to herniated disc in lower back, neck area--screws and rods in place   . BREAST BIOPSY      left breast biopsy   . BREAST SURGERY  1995    right mastectomy   . CHOLECYSTECTOMY  2001   . COLONOSCOPY  04/12/2015    Esophagogastroduodenoscopy with biopsy, Colonoscopy, diagnostic. no findings except for diverticula and check in 10 years   . HYSTERECTOMY      TAH-BSO. no  cancer   . JOINT REPLACEMENT Left     left total knee replacement   . LIPOMA RESECTION      right side below right breast   . MASTECTOMY, RADICAL  1995   . UPPER GASTROINTESTINAL ENDOSCOPY  04/12/2015    and colonoscopy. hiatal hernia and gastritis     Social History     Socioeconomic History   . Marital status: Widowed     Spouse name: Not on file   . Number of children: Not on file   . Years of education: Not on file   . Highest education level: Not on file   Occupational History   . Not on file   Social Needs   . Financial resource strain: Not on file   . Food insecurity     Worry: Not on file     Inability: Not on file   . Transportation needs     Medical: Not on file     Non-medical: Not on file   Tobacco Use   . Smoking status: Never Smoker   . Smokeless tobacco: Never Used    Substance and Sexual Activity   . Alcohol use: Yes     Comment: social-rare   . Drug use: No   . Sexual activity: Not Currently   Lifestyle   . Physical activity     Days per week: Not on file     Minutes per session: Not on file   . Stress: Not on file   Relationships   . Social Product manager on phone: Not on file     Gets together: Not on file     Attends religious service: Not on file     Active member of club or organization: Not on file     Attends meetings of clubs or organizations: Not on file     Relationship status: Not on file   . Intimate partner violence     Fear of current or ex partner: Not on file     Emotionally abused: Not on file     Physically abused: Not on file     Forced sexual activity: Not on file   Other Topics Concern   . Not on file   Social History Narrative   . Not on file     Family History   Problem Relation Age of Onset   . Cancer Mother         lung    . Cancer Father  bladder   . Heart Disease Father    . High Blood Pressure Father    . Cancer Maternal Aunt         ovarian   . Cancer Paternal Cousin         ovarian           LABS AND IMAGING  Outside data reviewed and in HPI    Lab Results   Component Value Date    WBC 5.3 11/12/2018    RBC 3.87 11/12/2018    RBC 4.72 10/26/2015    HGB 11.3 11/12/2018    HCT 34.7 11/12/2018    PLT 214 11/12/2018    MCV 89.6 11/12/2018    MCH 29.3 11/12/2018    MCHC 32.7 11/12/2018    RDW 14.5 11/12/2018    SEGSPCT 60.4 02/02/2010    LYMPHOPCT 32.6 11/12/2018    LYMPHOPCT 24.7 10/26/2015    MONOPCT 6.6 11/12/2018    EOSPCT 4.3 02/02/2010    BASOPCT 0.5 11/12/2018    MONOSABS 0.4 11/12/2018    LYMPHSABS 1.7 11/12/2018    EOSABS 0.1 11/12/2018    BASOSABS 0.0 11/12/2018    DIFFTYPE Auto 02/02/2010       Chemistry        Component Value Date/Time    NA 142 11/12/2018 1014    K 3.8 11/12/2018 1014    CL 106 11/12/2018 1014    CO2 24 11/12/2018 1014    BUN 20 11/12/2018 1014    CREATININE 0.9 11/12/2018 1014        Component Value  Date/Time    CALCIUM 9.3 11/12/2018 1014    ALKPHOS 78 11/12/2018 1014    AST 18 11/12/2018 1014    ALT 12 11/12/2018 1014    BILITOT 0.4 11/12/2018 1014          Lab Results   Component Value Date    SEDRATE 16 10/24/2018     Lab Results   Component Value Date    CRP 1.6 11/12/2018     Lab Results   Component Value Date    ANA Negative 11/12/2018     Lab Results   Component Value Date    RF <10.0 10/24/2018     Lab Results   Component Value Date    ANA Negative 11/12/2018     No results found for: DSDNAG, DSDNAIGGIFA  No results found for: SSAROAB, SSALAAB  No results found for: SMAB, RNPAB  No results found for: CENTABIGG  No results found for: C3, C4, ACE  Lab Results   Component Value Date    VITD25 36.9 11/12/2018     No results found for: Jodi Walls  Lab Results   Component Value Date    VITAMINB12 299 11/12/2018     Lab Results   Component Value Date    TSH 2.71 10/24/2018     Lab Results   Component Value Date    VITD25 36.9 11/12/2018       Hand and foot x-rays 11/11/2018  FINDINGS:    Right hand:        Osseous alignment is normal.        Severe degenerative changes of the 1st CMC joint. Mild degenerative changes    of the DIP joints and 1st IP joint. Chondrocalcinosis of the TFCC.        No marginal erosions are identified. No acute fracture or gross dislocation    is seen.        No  focal soft tissue swelling is evident.        Left hand:        Osseous alignment is normal.        Mild degenerative changes of the DIP joints, 1st MCP joint and triscaphe    joint. Mild to moderate degenerative change of the 1st [facility] joint.    Chondrocalcinosis of the TFCC.        No marginal erosions are identified. No acute fracture or gross dislocation    is seen.        No focal soft tissue swelling is evident.        Right foot:        Osseous alignment is normal. Mild to moderate degenerative changes of the    TMT joints primarily at the 1st TMT joint.        Moderate plantar calcaneal spur.  Soft tissue swelling at the right forefoot.    Calcification along the plantar fascia. No marginal erosions are identified.    No acute fracture or gross dislocation is seen.        The medial and middle cuneiforms demonstrate proper alignment with the base    of the 1st and 2nd metatarsals respectively.        No tibiotalar joint effusion is seen.        Boehler's angle is maintained.        Left foot:        Osseous alignment is normal.        Moderate degenerative changes of the midfoot and tarsal metatarsal joints.    Remote healed fracture deformities of the 2nd 3rd metatarsal diaphyses. Mild    plantar calcaneal spur. Calcification of the plantar fascia. Os trigonum    variant. Soft tissue swelling of the left forefoot. No marginal erosions    are identified. No acute fracture or gross dislocation is seen.        The medial and middle cuneiforms demonstrate proper alignment with the base    of the 1st and 2nd metatarsals respectively.        No tibiotalar joint effusion is seen.        Boehler's angle is maintained.            Impression    Right hand:        1. Osteoarthrosis which is severe at the 1st [facility] joint. CPPD.    2. No acute fracture or dislocation.    Left hand:        1. Mild to moderate osteoarthrosis. CPPD.    2. No acute fracture or dislocation.    Right foot:        1. Mild-to-moderate degenerative changes as detailed above. Moderate plantar    calcaneal spur.    2. Soft tissue swelling of the right forefoot.    3. No acute fracture or dislocation.    Left foot:        1. Moderate diffuse degenerative changes as detailed above.    2. Remote healed fracture deformities of the 2nd and 3rd metatarsal diaphyses.    3. Mild plantar calcaneal spur.    4. Soft tissue swelling of the forefoot.    5. No acute fracture or dislocation.        MRI left hand 11/15/2018  1. Mild volar subluxation of the 3rd metacarpophalangeal joint with mild    degenerative changes and a small  effusion.    2. Severe 1st carpometacarpal degenerative changes.  Moderate 1st    interphalangeal degenerative changes       ASSESSMENT AND PLAN      Assessment/Plan:      ASSESSMENT:    1. Primary osteoarthritis involving multiple joints    2. Swelling of hand joint, left    3. Numbness and tingling of upper and lower extremities of both sides    4. H/O calcium pyrophosphate deposition disease (CPPD)        PLAN:   1. Primary osteoarthritis involving multiple joints  Osteoarthritis/ Joint pain : Discussed about diagnosis and management of osteoarthritis. There are no FDA approved disease modifying agents. Goal is to control pain and increase mobility. Discussed the importance of wt loss, exercises, formal PT, joint braces/splints. First line of agent Tylenol arthritis, NSAIDs systemic and/ or topical,Cymbalta, pain meds, joint injections- steroids, and visco supplementaiton for knee OA. Also discussed about surgical options.  -RF, CCP negative, normal ESR and CRP.  Hand and foot x-rays with moderate to severe degenerative arthritis with CPPD  Unable to take oral NSAIDs due to CKD  Advised to take Tylenol 1000 mg 3 times a day, do not exceed 3 g daily  We will start diclofenac gel  Paraffin wax bath  Not willing to try physical therapy  - diclofenac sodium (VOLTAREN) 1 % GEL; Apply 4 grams to lower extremity joints and 2 grams to upper extremity joints as needed 4 times/day  Dispense: 500 g; Refill: 5    2. Swelling of hand joint, left  MRI of the left hand with mild volar subluxation of the third MCP joint with degenerative changes.  Scheduled to see hand surgeon.    3. Numbness and tingling of upper and lower extremities of both sides  Blood work showed vitamin B1 and vitamin B6 deficiency.  She was placed on replacement.  Magnesium, B12, TSH, hemoglobin A1c normal.  She is scheduled for EMG    4. H/O calcium pyrophosphate deposition disease (CPPD)  TSH, magnesium normal.  We will do work-up to rule out secondary  causes.  No flareups of pseudogout.  - Iron Binding Capacity; Future  - Iron; Future  - Ferritin; Future  - PTH, Intact; Future  - Phosphorus; Future     Time spent with the patient 25 minutes and half of the time spent with counseling on diagnosis and management and coordination of care    The patient indicates understanding of these issues and agrees with the plan.      Return in about 8 weeks (around 01/27/2019).      The risks and benefits of my recommendations, as well as other treatment options, benefits and side effects werediscussed with the patient. All questions were answered.       ######################################################################    I thank you for giving me theopportunity to participate in Jodi Walls care. If you have any questions or concerns please feel free to contact me. I look forward to following  Jodi Walls along with you.      Electronically signed by: Jodi Lamas, MD, 12/02/2018 11:21 AM    Documentation was done using voice recognition dragon software.  Every effort was made to ensure accuracy;however, inadvertent unintentional computerized transcription errors may be present.

## 2018-12-05 ENCOUNTER — Ambulatory Visit: Admit: 2018-12-05 | Discharge: 2018-12-05 | Payer: MEDICARE | Attending: Orthopaedic Surgery | Primary: Family Medicine

## 2018-12-05 DIAGNOSIS — M19042 Primary osteoarthritis, left hand: Secondary | ICD-10-CM

## 2018-12-05 NOTE — Patient Instructions (Signed)
Pre-Operative Instructions    1. The night before your surgery, unless otherwise instructed, do not eat any food, drink any liquids, chew gum or mints after midnight. Abstain from alcohol for 24 hours prior to surgery.     2. You will be contacted by the Hospital the working day prior to your procedure to confirm your arrival time.     3. Patients under 79 years of age must have a parent or legal guardian present to sign their consent and discharge paperwork.     4. On the day of surgery,  you will be seen pre-operatively by an anesthesiologist.     5. If you are having hand surgery, it is recommended that nail polish and acrylic nails be removed prior to surgery if possible.     6. Please bring cases for glasses, contact lenses, hearing aids or dentures. They will likely be removed prior to surgery.     7. Wear casual, loose-fitting and comfortable clothing. Consider that you may have a large dressing to fit under your clothing after surgery.     9. Please do not bring valuables such as jewelry or large sums of cash to the hospital. Remove all body piercings before coming to the hospital. You may not  wear any rings on the hand if you are having surgery on that hand, wrist or elbow.     10. Do not smoke or chew tobacco before your surgery. All [name] and surgery facilities are smoke-free environments. Smoking is not permitted anywhere on campus.     11. Be sure to follow any additional instructions from your physician.       If the above conditions are not met, your surgery may be cancelled and rescheduled for another day.      Should you develop any change in your health such as fever, cough, sore throat, cold, flu, or infection, or if you have any questions regarding your Pre-admission or surgery, please contact [facility] - Surgery Scheduling at [phone], Monday through Friday, 9 a.m. to 5 p.m.

## 2018-12-05 NOTE — Progress Notes (Addendum)
Jodi Walls is a 78 y.o. right handed woman  who is seen today in Hand Surgical Consultation at the request of Barron Alvine, MD.    She is seen today regarding a 1 year(s) history of left Middle Finger pain, stiffness, swelling and enlargement with history previous of injury (having "jammed her hand while picking up an ice bag").  She was seen for this concern by her primary care physician; previous treatment has included conservative measures.  She reports moderate pain, stiffness, swelling and enlargement located in the Left Middle Finger MCP Joint, no tenderness of the remaining hand, wrist, or elbow.  She  notes today, no neurologic symptoms in the hand. Symptoms show no change over time.      The patient's , past medical history, medications, allergies,  family history, social history, and review of systems have been updated, reviewed and have been scanned into the chart today.    Physical Exam:  Jodi Walls's most recent vitals:  Vitals  Temp: 97.2 ??F (36.2 ??C)  Height: 5\' 1"  (154.9 cm)  Weight: 192 lb (87.1 kg)    She is well nourished, oriented to person, place & time.  She demonstrates appropriate mood and affect as well as normal gait and station.    Skin: Normal in appearance, Normal Color and Free of Lesions Bilaterally   Digital range of motion is limited by Osteoarthritis diffusely, but significantly in the Middle Finger MCP Joint on the Left, normal on the Right.  left Middle Finger metacarpo-phalangeal joint range of motion is 15* - 50* with pain at both extremes  Wrist range of motion is without significant limitation bilaterally.  There is no evidence of gross joint instability bilaterally.  Sensation is subjectively normal in the Whole Hand bilaterally.  Vascular examination reveals normal and good capillary refill bilaterally.  Swelling is moderate in the Middle Finger MCP Joint on the Left, normal on the Right  Maximal pain is elicited with palpation of the Middle Finger MCP Joint  on the Left, normal on the Right  There is a 0 degree angular deformity and no clinical evidence of  mal-rotation of the symptomatic digit.  Other digits are not similarly effected bilaterally.  There is moderate synovitis and crepitance with manipulation of the Middle Finger metacarpo-phalangeal joint   The base of the hand & wrist are not tender to palpation bilaterally  Muscular strength is clinically appropriate bilaterally.      Radiographic Evaluation:  Radiographs are reviewed  today (3 views of the left hand).  They demonstrate no evidence of an acute fracture.  There is moderate osteoarthritis of the MCP Joint with moderate joint narrowing and mild osteophyte formation, & palmar subluxation and  10 degrees of angular deformity.  There is not evidence of other injury or bony fracture.      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    Plan:  I have had a thorough discussion with Jodi Walls regarding the treatment options available for her initially presenting left Middle Finger Metacarpo-Phalangeal Joint osteoarthritis, which is causing her significant  limitations.  I have outlined for Jodi Walls the benefits and consequences of the various treatment modalities.  I have recommended several conservative measures which may be beneficial to her.  We have discussed steroid injection to the painful joint in effort to reduce inflammation and pain.  She has understood these less  aggressive options and  Has declined to consider any of them. I have explained  the fact that surgical treatment is the only modality which is reasonably expected to provide long lasting or permanent resolution of her symptoms.  Based upon our current discussion and a reasonable understating of the options available to her, Jodi Walls has selected to proceed with surgical Middle Finger Metacarpo-Phalangeal Joint  implant arthroplasty.  I have discussed the details of the surgical procedure, the pre, peri and postoperative concerns and the appropriate expectations after surgery with Jodi Walls today. She was given the opportunity to ask questions, voiced an understanding of the procedure, and she did wish to proceed with Left Middle Finger Metacarpo-Phalangeal Joint implant arthroplasty.       I had an extensive discussion with Jodi Walls (and any family members present with her today) regarding the natural history, etiology, and long term consequences of this problem.  We have mutually selected a surgical treatment plan  and, in my opinion, surgical intervention is indicated at this time.   I have discussed with her the potential complications, limitations, expectations, alternatives, and risks of Middle Finger Metacarpo-Phalangeal Joint implant arthroplasty.  We have specifically discussed the risks of residual symptoms, the frequent occurrence of implant loosening, failure or infection, and the possible need for further surgical treatment for these complications.  She has had full opportunity to ask her questions. I have answered them all to her satisfaction. I feel that Jodi Walls (and any family members present with her today) do understand our discussion today and she has provided informed consent for Left Middle Finger Metacarpo-Phalangeal Joint implant arthroplasty.     I have also discussed with Jodi Walls the other treatment options available to her for this condition.  We have today selected to proceed with Surgical treatment.  She has voiced and  understanding that there are other less aggressive treatment options which are available in this situation, albeit possibly less efficacious or durable, and she is comfortable with the plan that she has chosen.        I have carefully explained to Jodi Walls the intensive course of Hand Therapy which is required after  Metacarpo-Phalangeal Joint implant arthroplasty including the requirement of full time splinting for up to 8 weeks after surgery, and she has agreed to consistently attend and fully participate is such therapy as I may direct.          I have explained to Jodi Walls that despite successful treatment (surgical or otherwise) of her current presenting condition, that due to her coexistent conditions (both diagnosed and undiagnosed), that she is likely to have some permanent residual symptoms related to these conditions that do not improve long term.  I have also explained that maximal recovery of function & symptom improvement may take a full year or longer to realize.  She voiced a clear understanding of this.    Jodi Walls has been given a full verbal list of instructions and precautions related to her present condition.  I have asked her to followup with me in the office at the prescribed time. She is also specifically requested to call or return to the office sooner if her symptoms change or worsen prior to the next scheduled appointment.

## 2018-12-18 ENCOUNTER — Ambulatory Visit: Admit: 2018-12-18 | Discharge: 2018-12-18 | Payer: MEDICARE | Attending: Family Medicine | Primary: Family Medicine

## 2018-12-18 ENCOUNTER — Inpatient Hospital Stay: Payer: MEDICARE | Primary: Family Medicine

## 2018-12-18 DIAGNOSIS — Z01818 Encounter for other preprocedural examination: Secondary | ICD-10-CM

## 2018-12-18 LAB — BASIC METABOLIC PANEL
Anion Gap: 13 (ref 3–16)
BUN: 16 mg/dL (ref 7–20)
CO2: 25 mmol/L (ref 21–32)
Calcium: 9.5 mg/dL (ref 8.3–10.6)
Chloride: 103 mmol/L (ref 99–110)
Creatinine: 1.2 mg/dL (ref 0.6–1.2)
GFR African American: 52 — AB (ref >60)
GFR Non-African American: 43 — AB (ref >60)
Glucose: 114 mg/dL — ABNORMAL HIGH (ref 70–99)
Potassium: 4.1 mmol/L (ref 3.5–5.1)
Sodium: 141 mmol/L (ref 136–145)

## 2018-12-18 LAB — EKG 12-LEAD
Atrial Rate: 64 {beats}/min
P Axis: 36 degrees
P-R Interval: 150 ms
Q-T Interval: 464 ms
QRS Duration: 150 ms
QTc Calculation (Bazett): 478 ms
R Axis: -12 degrees
T Axis: 103 degrees
Ventricular Rate: 64 {beats}/min

## 2018-12-18 NOTE — Patient Instructions (Addendum)
Stop voltaren gel and aspirin a week before the surgery  On the morning of the surgery take just the thyroid medicine and the omeprazole with just enough water to get those pills down as soon as you wake up   Do give dr Tamala Julian a call this week to let him know you are having surgery and if he needs to check you

## 2018-12-18 NOTE — Progress Notes (Signed)
 Subjective:      Patient ID: Jodi Walls is a 78 y.o. female.    Patient presents with:  Pre-op Exam: Hand Surgery on 12-25-2018 by Dr. Fredy Jericho at [facility].    She is to have hand surgery on the left   Left middle finger  She has seen heme onc recently  She has not seen cardiology recently   She still see dr Gerhard Knuckles for the renal     Allergies:   -- Pcn (Penicillins) -- Swelling    --  Face and hands swelled   -- Ace Inhibitors -- Other (See Comments)    --  cough   -- Atorvastatin -- Other (See Comments)    --  Muscle aches   -- Zithromax (Azithromycin) -- Swelling   -- Codeine -- Nausea And Vomiting    No tobacco hx. Rare ETOH use.       Immunization History  Administered            Date(s) Administered    Influenza             04/06/2010      Influenza Vaccine, unspecified formulation                          01/14/2013      Influenza Whole       02/10/2014      Influenza, High Dose (Fluzone 65 yrs and older)                          02/22/2011  02/14/2012  02/08/2015                            01/16/2016  02/05/2017  02/27/2018      Pneumococcal Conjugate 13-valent (Prevnar13)                          07/13/2015      Pneumococcal Polysaccharide (Pneumovax23)                          08/24/2010      Tdap (Boostrix, Adacel)                          03/05/2016        Current Outpatient Medications:   .  diclofenac  sodium (VOLTAREN ) 1 % GEL, Apply 4 grams to lower extremity joints and 2 grams to upper extremity joints as needed 4 times/day  .  vitamin B-1 (THIAMINE ) 100 MG tablet, Take 1 tablet by mouth daily  .  vitamin B-6 (PYRIDOXINE) 50 MG tablet, Take 1 tablet by mouth daily  .  furosemide  (LASIX ) 40 MG tablet, Take 1 tablet by mouth See Admin Instructions One daily prn edema   .  levothyroxine  (SYNTHROID ) 75 MCG tablet, TAKE 1 TABLET BY MOUTH DAILY  .  omeprazole  (PRILOSEC) 20 MG delayed release capsule, TAKE ONE CAPSULE BY MOUTH EVERY DAY,   .  potassium chloride  (KLOR-CON  M) 10 MEQ extended release  tablet, Take 1 tablet by mouth daily (Patient taking differently: Take 10 mEq by mouth daily as needed (use when you take the water pill) )  .  melatonin 3 MG TABS tablet, Take 3 mg by mouth nightly  .  aspirin  81 MG tablet, Take  81 mg by mouth daily  .  gabapentin  (NEURONTIN ) 300 MG capsule, TAKE ONE CAPSULE BY MOUTH EVERY DAY WITH SUPPER (Patient taking differently: Take 300 mg by mouth nightly. )  .  FLUZONE HIGH-DOSE 0.5 ML SUSY injection, ADM 0.5ML IM UTD    Past Surgical History:  No date: APPENDECTOMY  No date: BACK SURGERY      Comment:  r/t to herniated disc in lower back, neck area--screws                and rods in place  No date: BREAST BIOPSY      Comment:  left breast biopsy  1995: BREAST SURGERY      Comment:  right mastectomy  2001: CHOLECYSTECTOMY  04/12/2015: COLONOSCOPY      Comment:  Esophagogastroduodenoscopy with biopsy, Colonoscopy,                diagnostic. no findings except for diverticula and check                in 10 years  No date: HYSTERECTOMY      Comment:  TAH-BSO. no  cancer  No date: JOINT REPLACEMENT; Left      Comment:  left total knee replacement  No date: LIPOMA RESECTION      Comment:  right side below right breast  1995: MASTECTOMY, RADICAL  04/12/2015: UPPER GASTROINTESTINAL ENDOSCOPY      Comment:  and colonoscopy. hiatal hernia and gastritis    No problems with anesthesia    Past Medical History:  Anemia  Arthritis  1995: Cancer (HCC)      Comment:  hx of breast cancer--no chemo or radiation needed  GERD (gastroesophageal reflux disease)  2001: Hx of blood clots      Comment:  after gallbladder surgery--blood clot in arm where IV                had been.  By her hx this was not treated as outpatient with anticoagulation and was just in hospital for 5 days   Hypothyroidism  Kidney failure      Comment:  stage 3  PONV (postoperative nausea and vomiting)  Wears glasses      Comment:  reading              Review of Systems   Constitutional: Negative for appetite change,  chills, fever and unexpected weight change.   HENT: Negative for sore throat and voice change.         Has full dentures    Respiratory: Negative for cough, chest tightness and shortness of breath.         If she does a lot of walking will get sob   Cardiovascular: Negative for chest pain and palpitations.        Occ end of day lower leg swelling and better in am.  Vacuums no cp. She can push a cart in grocery store and load and unload with no cp  If she walks fast or walks long distance will get sob. going on for 2-3 years    Gastrointestinal: Negative for abdominal distention, abdominal pain, blood in stool, constipation, diarrhea, nausea and vomiting.        No gerd no dysphagia occ meats will stick not new. Many years   Genitourinary: Negative for difficulty urinating, dysuria and hematuria.   Musculoskeletal: Negative for neck pain.   Skin: Negative for rash.   Neurological: Negative for dizziness and headaches.  No hx of CVA   Hematological: Negative for adenopathy. Does not bruise/bleed easily.        She had a blood clot in 2001 but treated in hospital for 5 days and was felt due to iv placement and infection on the left arm and hand. She was not sent home with anticoagulation. No family hx of blood clot or bleeding problems       Objective:   Physical Exam  Constitutional:       General: She is not in acute distress.     Appearance: Normal appearance. She is well-developed. She is not ill-appearing or diaphoretic.   HENT:      Head: Normocephalic and atraumatic.      Right Ear: Tympanic membrane and ear canal normal.      Left Ear: Tympanic membrane and ear canal normal.      Nose: Nose normal.      Mouth/Throat:      Lips: Pink.      Mouth: Mucous membranes are moist. No oral lesions.      Pharynx: Oropharynx is clear. Uvula midline.   Eyes:      General: No scleral icterus.  Neck:      Musculoskeletal: Full passive range of motion without pain and neck supple.      Thyroid: No thyroid mass or  thyromegaly.   Cardiovascular:      Rate and Rhythm: Normal rate and regular rhythm.      Heart sounds: Normal heart sounds. No murmur. No friction rub. No gallop.       Comments:   Trace edema at the ankles  Pulmonary:      Effort: Pulmonary effort is normal. No tachypnea, accessory muscle usage or respiratory distress.      Breath sounds: Normal breath sounds. No decreased breath sounds, wheezing, rhonchi or rales.   Abdominal:      General: Bowel sounds are normal. There is no distension or abdominal bruit.      Palpations: Abdomen is soft. There is no hepatomegaly, splenomegaly, mass or pulsatile mass.      Tenderness: There is no abdominal tenderness. There is no guarding.   Lymphadenopathy:      Head:      Right side of head: No submental or submandibular adenopathy.      Left side of head: No submental or submandibular adenopathy.      Cervical: No cervical adenopathy.      Upper Body:      Right upper body: No supraclavicular adenopathy.      Left upper body: No supraclavicular adenopathy.   Skin:     General: Skin is warm and dry.      Coloration: Skin is not pale.      Nails: There is no clubbing.     Neurological:      General: No focal deficit present.      Mental Status: She is alert.         Assessment:        Diagnosis Orders   1. Preop examination  Basic Metabolic Panel    EKG 12 Lead   2. Finger stiffness, left     3. Left hand pain     4. Essential hypertension, benign  Basic Metabolic Panel    EKG 12 Lead   5. Acquired hypothyroidism     6. Renal insufficiency     7. HX: breast cancer       Overall well  Should do  ok for the surgery  She will contact her cardiologist however      Plan:      Stop voltaren  gel and aspirin  a week before the surgery  On the morning of the surgery take just the thyroid medicine and the omeprazole  with just enough water to get those pills down as soon as you wake up   Do give dr Felipe Horton a call this week to let him know you are having surgery and if he needs to check you         Orysia Blas, MD

## 2018-12-19 ENCOUNTER — Ambulatory Visit: Admit: 2018-12-19 | Payer: MEDICARE | Attending: Family | Primary: Family Medicine

## 2018-12-19 DIAGNOSIS — Z01812 Encounter for preprocedural laboratory examination: Secondary | ICD-10-CM

## 2018-12-19 NOTE — Progress Notes (Signed)
Patient presented to COVID drive up clinic for preop testing. Patient was swabbed and given information advising them to remain isolated until procedure date.

## 2018-12-19 NOTE — Other (Unsigned)
Progress Notes by Drue Stager., MD at 12/19/18 1530     Author:  Drue Stager., MD Service:  - Author Type:  Physician    Filed:  12/19/18 1549 Encounter Date:  12/19/2018 Status:  Signed    Editor:  Drue Stager., MD (Physician)       Jodi Walls was referred back to the [facility] in [facility] for preoperative cardiac clearance.  She is pending hand surgery.  I   have not seen the patient since 2018. Prior office notes and medical records   were reviewed in conjunction with the visit.    1. ????Left bundle branch block-This has been a stable finding on her surface   ECG. ????She is otherwise asymptomatic. ????Previous transthoracic echocardiogram   showed a structurally normal heart and preserved left ventricular systolic   function. ????SPECT MPI showed normal myocardial perfusion. 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 There is a low risk of progression to   higher degrees of potentially symptomatic heart block.  ????  2. ????Shortness of breath-This has been somewhat of a chronic complaint. ????  ????  3.  Chest discomfort-Very atypical of angina.  ECG nondiagnostic.  Coronary   calcium score 2017 was only 29.  I will see her back for follow-up in one   year.    4.  Preoperative cardiac clinic-Overall, I believe she is low risk for   perioperative cardiac complication and may proceed with the recommended   surgery without additional cardiac testing at this time.  ????  ????  --- Please note that I used Set designer software to generate the   above note. Occasionally words are mistranscribed despite my best efforts to   edit errors. --- ????  ????[JS.1]    Electronically signed by Drue Stager., MD on 12/19/18 1549.   Attribution Key   [email or web address].1 - Drue Stager., MD on 12/19/18 1217

## 2018-12-19 NOTE — Other (Unsigned)
Progress Notes by Drue Stager., MD at 12/19/18 1530     Author:  Drue Stager., MD Service:  - Author Type:  Physician    Filed:  12/19/18 1549 Encounter Date:  12/19/2018 Status:  Signed    Editor:  Drue Stager., MD (Physician)       Subjective   Jodi Walls  17-Nov-1940  Chief Complaint   Patient presents with   ??????? Cardiac Clearance     Hand surgery[JS.1]          Jodi Walls was referred back to the [facility] in [facility] for preoperative cardiac clearance.  She is pending hand surgery.  I   have not seen the patient since 2018. Prior office notes and medical records   were reviewed in conjunction with the visit.[JS.2]    Since I saw her last, she reports being in her usual state of health.  She   denies any unprovoked episodes of feeling lightheaded, dizzy or faint.  She is   function limited due to her obesity and osteoarthritis but has not been   complaining of increasing shortness of breath, orthopnea or paroxysmal   nocturnal dyspnea.[JS.3]        Patient's Medications   Current Medications    ASPIRIN 81 MG PO TABLET    Take 81 mg by mouth daily.       Order Dose: 81 mg    FUROSEMIDE (LASIX) 40 MG TABLET    Take 40 mg by mouth daily. PRN       Order Dose: 40 mg    GABAPENTIN (NEURONTIN) 300 MG CAPSULE    Take 300 mg by mouth daily.       Order Dose: 300 mg    LEVOTHYROXINE (SYNTHROID) 75 MCG PO TABLET    daily       Order Dose: --    MELATONIN 3 MG TABLET    Take 3 mg by mouth nightly at bedtime.       Order Dose: 3 mg    OMEPRAZOLE (PRILOSEC) 20 MG CAPSULE    Take 20 mg by mouth daily.       Order Dose: 20 mg    POTASSIUM CHLORIDE (KDUR) 10 MEQ PO TABLET    Take 10 mEq by mouth 2 times   daily. daily       Order Dose: 10 mEq[JS.1]               ROS   Review of Systems   Constitutional: Negative.    HENT: Negative.    Eyes: Negative.    Respiratory: Positive for shortness of breath.         DOE   Cardiovascular: Positive for leg swelling.        Intermittent    Gastrointestinal: Negative.    Endocrine: Negative.    Genitourinary: Negative.    Musculoskeletal: Negative.    Skin: Negative.    Allergic/Immunologic: Negative.    Neurological: Negative.    Hematological: Negative.    Psychiatric/Behavioral: Negative.[RP.1]            Objective   Vitals:    12/19/18 1532   BP: 130/82   Pulse: 86   Weight: 187 lb (84.8 kg)   Height: 5\' 1"  (1.549 m)     Body mass index is 35.33 kg/m????.[JS.1]    Physical Exam[RP.1]  Vitals signs[JS.2] reviewed.   Constitutional:       General: She is[RP.1] not  in acute distress[JS.2].     Appearance:[RP.1] Normal appearance[JS.2]. She is not[RP.1]   diaphoretic[JS.2].   Eyes:      General:[RP.1] Lids[JS.2] are normal.      Conjunctiva/sclera:[RP.1] Conjunctivae normal[JS.2].      Pupils:[RP.1] Pupils are equal, round, and reactive to light[JS.2].   Neck:      Musculoskeletal:[RP.1] Full passive range of motion without   pain[JS.2],[RP.1] normal range of motion[JS.2] and[RP.1] neck supple[JS.2].      Thyroid: No[RP.1] thyroid mass[JS.2] or[RP.1] thyromegaly[JS.2].      Vascular:[RP.1] Normal carotid pulses[JS.2]. No[RP.1] carotid   bruit[JS.2],[RP.1] hepatojugular reflux[JS.2] or[RP.1] JVD[JS.2].      Trachea:[RP.1] Trachea[JS.2] and[RP.1] phonation[JS.2] normal.   Cardiovascular:      Rate and Rhythm:[RP.1] Normal rate[JS.2] and[RP.1] regular   rhythm[JS.2].[RP.1]  No extrasystoles[JS.2] are present.     Chest Wall:[RP.1] PMI is not displaced[JS.2].      Pulses:[RP.1] Normal pulses[JS.2].      Heart sounds:[RP.1] Normal heart sounds[JS.2],[RP.1] S1 normal[JS.2]   and[RP.1] S2 normal[JS.2].[RP.1] No murmur[JS.2]. 99991111 systolic[JS.2]   murmur. 99991111 diastolic[JS.2] murmur. No[RP.1] friction rub[JS.2]. No[RP.1]   gallop[JS.2]. No[RP.1] S3[JS.2] or[RP.1] S4[JS.2] sounds.    Pulmonary:      Effort: Pulmonary effort is[RP.1] normal[JS.2]. No[RP.1] respiratory   distress[JS.2].      Breath sounds: Normal[RP.1] breath sounds[JS.2]. No[RP.1]  wheezing[JS.2].   Abdominal:      General: Bowel sounds are[RP.1] normal[JS.2].      Tenderness: There is[RP.1] no abdominal tenderness[JS.2].   Musculoskeletal:[RP.1] Normal range of motion[JS.2].   Skin:     General: Skin is[RP.1] warm[JS.2] and[RP.1] dry[JS.2].      Findings: No[RP.1] abrasion[JS.2],[RP.1] bruising[JS.2],[RP.1]   burn[JS.2],[RP.1] ecchymosis[JS.2] or[RP.1] rash[JS.2].      Nails: There is no[RP.1] clubbing[JS.2].     Neurological:      Mental Status: She is[RP.1] alert[JS.2] and[RP.1] oriented to person,   place, and time[JS.2].      Deep Tendon Reflexes:[RP.1] Reflexes are normal and symmetric[JS.2].   Psychiatric:         Judgment:[RP.1] Judgment[JS.2] normal.           IMPRESSION:[RP.1]      ICD-10-CM    1. LBBB (left bundle branch block)  I44.7    2. 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 Agatston coronary artery calcium score less than 100  R93.1[JS.1]            PLAN AND RECOMMENDATION:[RP.1]    1. ????Left bundle branch block-This has been a stable finding on her surface   ECG. ????She is otherwise asymptomatic. ????Previous transthoracic echocardiogram   showed a structurally normal heart and preserved left ventricular systolic   function. ????SPECT MPI showed normal myocardial perfusion. 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 There is a low risk of progression to   higher degrees of potentially symptomatic heart block.  ????  2. ????Shortness of breath-This has been somewhat of a chronic complaint. ????  ????  3.  Chest discomfort-Very atypical of angina.  ECG nondiagnostic.  Coronary   calcium score 2017 was only 29.  I will see her back for follow-up in one   year.    4.  Preoperative cardiac clinic-Overall, I believe she is low risk for   perioperative cardiac complication and may proceed with the recommended   surgery without additional cardiac testing at this time.  ????  ????  --- Please note that I used  voice recognition software to generate the above   note. Occasionally words are  mistranscribed despite my best  efforts to edit   errors. --- ????  ????[JS.2]           Electronically signed by Drue Stager., MD on 12/19/18 1549.   Attribution Key   JS.1 - Drue Stager., MD on 12/19/18 [place].2 - Drue Stager., MD on 12/19/18 1538 JS.3 - Drue Stager., MD on 12/19/18 1548 RP.1   - Hazle Nordmann., MA on 12/19/18 1537         Revision History      Date/Time User Provider Type Action   > 12/19/18 1549   Drue Stager., MD Physician Sign    12/19/18 1538 Elyse Jarvis Towanda Malkin., MA   Medical Assistant Sign when Signing Visit

## 2018-12-21 LAB — COVID-19: SARS-CoV-2, NAA: INVALID — AB

## 2018-12-22 NOTE — Progress Notes (Signed)
C-Difficile admission screening and protocol:     * Admitted with diarrhea? n     *Prior history of C-Diff. In last 3 months?n     *Antibiotic use in the past 6-8 weeks?n     *Prior hospitalization or nursing home in the last month?  n      [place] time_______800_____        Surgery time____________    Take the following medications with a sip of water:    Do not eat or drink anything after 12:00 midnight prior to your surgery.  This includes water chewing gum, mints and ice chips.   You may brush your teeth and gargle the morning of your surgery, but do not swallow the water     Please see your family doctor/pediatrician for a history and physical and/or concerning medications.   Bring any test results/reports from your physicians office.   If you are under the care of a heart doctor or specialist doctor, please be aware that you may be asked to them for clearance    You may be asked to stop blood thinners such as Coumadin, Plavix, Fragmin, Lovenox, etc., or any anti-inflammatories such as:  Aspirin, Ibuprofen, Advil, Naproxen prior to your surgery.    We also ask that you stop any OTC medications such as fish oil, vitamin E, glucosamine, garlic, Multivitamins, COQ 10, etc.    We ask that you do not smoke 24 hours prior to surgery  We ask that you do not  drink any alcoholic beverages 24 hours prior to surgery     You must make arrangements for a responsible adult to take you home after your surgery.    For your safety you will not be allowed to leave alone or drive yourself home.  Your surgery will be cancelled if you do not have a ride home.     Also for your safety, it is strongly suggested that someone stay with you the first 24 hours after your surgery.     A parent or legal guardian must accompany a child scheduled for surgery and plan to stay at the hospital until the child is discharged.    Please do not bring other children with you.    For your comfort,  please wear simple loose fitting clothing to the hospital.  Please do not bring valuables.    Do not wear any make-up or nail polish on your fingers or toes      For your safety, please do not wear any jewelry or body piercing's on the day of surgery.   All jewelry must be removed.      If you have dentures, they will be removed before going to operating room.    For your convenience, we will provide you with a container.    If you wear contact lenses or glasses, they will be removed, please bring a case for them.     If you have a living will and a durable power of attorney for healthcare, please bring in a copy.     As part of our patient safety program to minimize surgical site infections, we ask you to do the following:    ?? Please notify your surgeon if you develop any illness between         now and the  day of your surgery.    ?? This includes a cough, cold, fever, sore throat, nausea,  or vomiting, and diarrhea, etc.  ??  Please notify your surgeon if you experience dizziness, shortness         of breath or blurred vision between now and the time of your surgery.      Do not shave your operative site 96 hours prior to surgery.   For face and neck surgery, men may use an electric razor 48 hours   prior to surgery.    You may shower the night before surgery or the morning of   your surgery with an antibacterial soap.    You will need to bring a photo ID and insurance card    [facility] has an onsite pharmacy, would you like to utilize our pharmacy     If you will be staying overnight and use a C-pap machine, please bring   your C-pap to hospital     Our goal is to provide you with excellent care, therefore, visitors will be limited to two(2) in the room at a time so that we may focus on providing this care for you.          Please contact pre-admission testing if you have any further questions.                 [facility] phone number:  [place] [facility] fax number:  [phone]  Please note these  are generalized instructions for all surgical cases, you may be provided with more specific instructions according to your surgery.

## 2018-12-22 NOTE — Progress Notes (Incomplete)
covid test rescheudled due to invalid/ not detected.  Pt notified and 11am

## 2018-12-22 NOTE — Progress Notes (Signed)
Preoperative Screening for Elective Surgery/Invasive Procedures While COVID-19 present in the community    . Have you tested positive or have been told to self-isolate for COVID-19 like symptoms within the past 28 days?n  . Do you currently have any of the following symptoms?n  o Fever >100.0 F or 99.9 F in immunocompromised patients?n  o New onset cough, shortness of breath or difficulty breathing?n  o New onset sore throat, myalgia (muscle aches and pains), headache, loss of taste/smell or diarrhea?n  . Have you had a potential exposure to COVID-19 within the past 14 days by:  o Close contact with a confirmed case?n  o Close contact with a Research scientist (physical sciences), first responder or essential Holiday representative (grocery store, [employer], gas station, public utilities or transportation)? YES  o Do you reside in a congregate setting such as; skilled nursing facility, adult home, correctional facility, homeless shelter or other institutional setting? no  o Have you had recent travel to a known COVID-19 hotspot?pt resides in [place]    Indicate if the patient has a positive screen by answering yes to one or more of the above questions.  Patients who test positive or screen positive prior to surgery or on the day of surgery should be evaluated in conjunction with the surgeon/proceduralist/anesthesiologist to determine the urgency of the procedure.

## 2018-12-23 ENCOUNTER — Ambulatory Visit: Admit: 2018-12-23 | Payer: MEDICARE | Attending: Family | Primary: Family Medicine

## 2018-12-23 DIAGNOSIS — Z20822 Contact with and (suspected) exposure to covid-19: Secondary | ICD-10-CM

## 2018-12-23 NOTE — Progress Notes (Signed)
[name] received a viral test for COVID-19. They were educated on isolation and quarantine as appropriate. For any symptoms, they were directed to seek care from their PCP, given contact information to establish with a doctor, directed to an urgent care or the emergency room.

## 2018-12-24 LAB — COVID-19: SARS-CoV-2, PCR: NOT DETECTED

## 2018-12-24 NOTE — Other (Signed)
Your test for COVID-19, also known as novel coronavirus, came back negative. No virus was detected from the sample collected.     Testing is not 100%.   Until your symptoms are fully resolved, you may still be contagious.     We recommend that you remain isolated for 7 days minimum or 72 hours after your symptoms have completely resolved, whichever is longer.     If you were exposed to a known positive COVID-19 patient, then you must remain isolated for 14 days.      If you were tested for a pre-op, then you remain in isolated until your procedure.   Continually monitor symptoms. Contact a medical provider if symptoms are worsening. If you have any additional questions, contact your PCP.    For additional information, please visit the Centers for Disease Control and Prevention https://www.cdc.gov/coronavirus/2019-ncov/index.html

## 2018-12-25 ENCOUNTER — Inpatient Hospital Stay: Payer: MEDICARE

## 2018-12-25 ENCOUNTER — Encounter

## 2018-12-25 ENCOUNTER — Encounter: Admit: 2018-12-25 | Payer: MEDICARE | Attending: Orthopaedic Surgery | Primary: Family Medicine

## 2018-12-25 MED ORDER — PROPOFOL 200 MG/20ML IV EMUL
200 | INTRAVENOUS | Status: AC
Start: 2018-12-25 — End: 2018-12-25

## 2018-12-25 MED ORDER — VANCOMYCIN 1500 MG D5W 250 ML IVPB
Freq: Once | Status: AC
Start: 2018-12-25 — End: 2018-12-25
  Administered 2018-12-25: 13:00:00 1500 mg via INTRAVENOUS

## 2018-12-25 MED ORDER — ONDANSETRON HCL 4 MG/2ML IJ SOLN
4 MG/2ML | INTRAMUSCULAR | Status: DC | PRN
Start: 2018-12-25 — End: 2018-12-25
  Administered 2018-12-25: 14:00:00 4 via INTRAVENOUS

## 2018-12-25 MED ORDER — BUPIVACAINE HCL (PF) 0.5 % IJ SOLN
0.5 % | Freq: Once | INTRAMUSCULAR | Status: AC | PRN
Start: 2018-12-25 — End: 2018-12-25
  Administered 2018-12-25: 14:00:00 6.5 via INTRADERMAL

## 2018-12-25 MED ORDER — LABETALOL HCL 5 MG/ML IV SOLN
5 | INTRAVENOUS | Status: DC | PRN
Start: 2018-12-25 — End: 2018-12-25
  Administered 2018-12-25: 15:00:00 5 mg via INTRAVENOUS

## 2018-12-25 MED ORDER — ONDANSETRON HCL 4 MG/2ML IJ SOLN
4 | INTRAMUSCULAR | Status: AC
Start: 2018-12-25 — End: 2018-12-25

## 2018-12-25 MED ORDER — CEFAZOLIN 2000 MG D5W 100 ML IVPB
Freq: Once | Status: DC
Start: 2018-12-25 — End: 2018-12-25

## 2018-12-25 MED ORDER — FAMOTIDINE 20 MG/2ML IV SOLN
20 | INTRAVENOUS | Status: AC
Start: 2018-12-25 — End: 2018-12-25

## 2018-12-25 MED ORDER — FAMOTIDINE 20 MG/2ML IV SOLN
20 | INTRAVENOUS | Status: DC | PRN
Start: 2018-12-25 — End: 2018-12-25
  Administered 2018-12-25: 13:00:00 20 via INTRAVENOUS

## 2018-12-25 MED ORDER — PROMETHAZINE HCL 25 MG/ML IJ SOLN
25 MG/ML | Freq: Once | INTRAMUSCULAR | Status: DC | PRN
Start: 2018-12-25 — End: 2018-12-25

## 2018-12-25 MED ORDER — SODIUM CHLORIDE 0.9 % IV SOLN
0.9 % | INTRAVENOUS | Status: DC
Start: 2018-12-25 — End: 2018-12-25
  Administered 2018-12-25: 13:00:00 via INTRAVENOUS

## 2018-12-25 MED ORDER — APREPITANT 40 MG PO CAPS
40 MG | Freq: Once | ORAL | Status: AC
Start: 2018-12-25 — End: 2018-12-25
  Administered 2018-12-25: 13:00:00 40 mg via ORAL

## 2018-12-25 MED ORDER — NORMAL SALINE FLUSH 0.9 % IV SOLN
0.9 % | INTRAVENOUS | Status: DC | PRN
Start: 2018-12-25 — End: 2018-12-25

## 2018-12-25 MED ORDER — FENTANYL CITRATE (PF) 100 MCG/2ML IJ SOLN
100 MCG/2ML | INTRAMUSCULAR | Status: DC | PRN
Start: 2018-12-25 — End: 2018-12-25

## 2018-12-25 MED ORDER — FENTANYL CITRATE (PF) 100 MCG/2ML IJ SOLN
100 MCG/2ML | INTRAMUSCULAR | Status: DC | PRN
Start: 2018-12-25 — End: 2018-12-25
  Administered 2018-12-25 (×4): 25 via INTRAVENOUS

## 2018-12-25 MED ORDER — HYDROMORPHONE 0.5MG/0.5ML IJ SOLN
1 MG/ML | Status: DC | PRN
Start: 2018-12-25 — End: 2018-12-25
  Administered 2018-12-25 (×2): 0.5 mg via INTRAVENOUS

## 2018-12-25 MED ORDER — DEXAMETHASONE SODIUM PHOSPHATE 20 MG/5ML IJ SOLN
20 | INTRAMUSCULAR | Status: AC
Start: 2018-12-25 — End: 2018-12-25

## 2018-12-25 MED ORDER — PROPOFOL 200 MG/20ML IV EMUL
200 | INTRAVENOUS | Status: DC | PRN
Start: 2018-12-25 — End: 2018-12-25
  Administered 2018-12-25: 14:00:00 200 via INTRAVENOUS

## 2018-12-25 MED ORDER — LIDOCAINE HCL (PF) 2 % IJ SOLN
2 | INTRAMUSCULAR | Status: AC
Start: 2018-12-25 — End: 2018-12-25

## 2018-12-25 MED ORDER — FENTANYL CITRATE (PF) 100 MCG/2ML IJ SOLN
100 | INTRAMUSCULAR | Status: AC
Start: 2018-12-25 — End: 2018-12-25

## 2018-12-25 MED ORDER — LIDOCAINE HCL (PF) 2 % IJ SOLN
2 % | INTRAMUSCULAR | Status: DC | PRN
Start: 2018-12-25 — End: 2018-12-25
  Administered 2018-12-25: 14:00:00 60 via INTRAVENOUS

## 2018-12-25 MED ORDER — HYDROCODONE-ACETAMINOPHEN 5-325 MG PO TABS
5-325 MG | ORAL_TABLET | Freq: Four times a day (QID) | ORAL | 0 refills | Status: AC | PRN
Start: 2018-12-25 — End: 2019-01-01

## 2018-12-25 MED ORDER — NORMAL SALINE FLUSH 0.9 % IV SOLN
0.9 | Freq: Two times a day (BID) | INTRAVENOUS | Status: DC
Start: 2018-12-25 — End: 2018-12-25

## 2018-12-25 MED ORDER — DEXAMETHASONE SODIUM PHOSPHATE 20 MG/5ML IJ SOLN
20 MG/5ML | INTRAMUSCULAR | Status: DC | PRN
Start: 2018-12-25 — End: 2018-12-25
  Administered 2018-12-25: 14:00:00 10 via INTRAVENOUS

## 2018-12-25 MED FILL — VANCOMYCIN 1500 MG D5W 250 ML IVPB: Qty: 1.5

## 2018-12-25 MED FILL — XYLOCAINE-MPF 2 % IJ SOLN: 2 % | INTRAMUSCULAR | Qty: 5

## 2018-12-25 MED FILL — APREPITANT 40 MG PO CAPS: 40 MG | ORAL | Qty: 1

## 2018-12-25 MED FILL — HYDROMORPHONE HCL 1 MG/ML IJ SOLN: 1 mg/mL | INTRAMUSCULAR | Qty: 0.5

## 2018-12-25 MED FILL — FAMOTIDINE 20 MG/2ML IV SOLN: 20 MG/2ML | INTRAVENOUS | Qty: 2

## 2018-12-25 MED FILL — FENTANYL CITRATE (PF) 100 MCG/2ML IJ SOLN: 100 MCG/2ML | INTRAMUSCULAR | Qty: 2

## 2018-12-25 MED FILL — DIPRIVAN 200 MG/20ML IV EMUL: 200 MG/20ML | INTRAVENOUS | Qty: 20

## 2018-12-25 MED FILL — ONDANSETRON HCL 4 MG/2ML IJ SOLN: 4 MG/2ML | INTRAMUSCULAR | Qty: 2

## 2018-12-25 MED FILL — DEXAMETHASONE SODIUM PHOSPHATE 20 MG/5ML IJ SOLN: 20 MG/5ML | INTRAMUSCULAR | Qty: 5

## 2018-12-25 NOTE — Progress Notes (Signed)
Meets criteria for discharge from PACU.  Waiting for phase II bed.

## 2018-12-25 NOTE — H&P (Signed)
Pre-operative Update of H&P:    I  have seen & examined [name] related solely to her hand and upper extremity conditions, prior to the scheduled procedure on the date of her surgery.  The indications for the planned surgical procedure & and her upper-extremity condition are unchanged.

## 2018-12-25 NOTE — Anesthesia Pre-Procedure Evaluation (Signed)
[facility] Department of Anesthesiology  Pre-Anesthesia Evaluation/Consultation       Name:  Jodi Walls  DOB: [birth year]  Age:  78 y.o.                                           MRN:  [id number]  Date: 12/25/2018           Surgeon: Surgeon(s):  Delene Loll, MD    Procedure: Procedure(s):  LEFT MIDDLE FINGER METACARPO-PHALANGEAL JOINT SYNOVECTOMY, LIGAMENT REBALANCING, EXTENSOR TENDON CENTRALIZATION AND SILICONE METACARPOPHALANGEAL JOINT ARTHROPLASTY     Allergies   Allergen Reactions   ??? Pcn [Penicillins] Swelling     Face and hands swelled   ??? Ace Inhibitors Other (See Comments)     cough   ??? Atorvastatin Other (See Comments)     Muscle aches   ??? Zithromax [Azithromycin] Swelling   ??? Codeine Nausea And Vomiting     Patient Active Problem List   Diagnosis   ??? Other and unspecified hyperlipidemia   ??? Hypothyroidism   ??? Essential hypertension, benign   ??? HX: breast cancer   ??? Renal insufficiency   ??? Iron deficiency anemia due to chronic blood loss   ??? MGUS (monoclonal gammopathy of unknown significance)     Past Medical History:   Diagnosis Date   ??? Anemia    ??? Arthritis    ??? Cancer ([place]) 1995    hx of breast cancer--no chemo or radiation needed   ??? GERD (gastroesophageal reflux disease)    ??? Hx of blood clots 2001    after gallbladder surgery--blood clot in arm where IV had been   ??? Hypothyroidism    ??? Kidney failure     stage 3   ??? PONV (postoperative nausea and vomiting)    ??? Wears glasses     reading     Past Surgical History:   Procedure Laterality Date   ??? APPENDECTOMY     ??? BACK SURGERY      r/t to herniated disc in lower back, neck area--screws and rods in place   ??? BREAST BIOPSY      left breast biopsy   ??? BREAST SURGERY  1995    right mastectomy   ??? CHOLECYSTECTOMY  2001   ??? COLONOSCOPY  04/12/2015    Esophagogastroduodenoscopy with biopsy, Colonoscopy, diagnostic. no findings except for diverticula and check in 10 years   ??? HYSTERECTOMY      TAH-BSO. no  cancer   ??? JOINT REPLACEMENT Left     left  total knee replacement   ??? LIPOMA RESECTION      right side below right breast   ??? MASTECTOMY, RADICAL  1995   ??? UPPER GASTROINTESTINAL ENDOSCOPY  04/12/2015    and colonoscopy. hiatal hernia and gastritis     Social History     Tobacco Use   ??? Smoking status: Never Smoker   ??? Smokeless tobacco: Never Used   Substance Use Topics   ??? Alcohol use: Yes     Comment: social-rare. once a year   ??? Drug use: No     Medications  No current facility-administered medications on file prior to encounter.      Current Outpatient Medications on File Prior to Encounter   Medication Sig Dispense Refill   ??? diclofenac sodium (VOLTAREN) 1 % GEL Apply 4 grams to lower  extremity joints and 2 grams to upper extremity joints as needed 4 times/day 500 g 5   ??? vitamin B-1 (THIAMINE) 100 MG tablet Take 1 tablet by mouth daily 30 tablet 2   ??? vitamin B-6 (PYRIDOXINE) 50 MG tablet Take 1 tablet by mouth daily 30 tablet 2   ??? furosemide (LASIX) 40 MG tablet Take 1 tablet by mouth See Admin Instructions One daily prn edema 90 tablet 0   ??? levothyroxine (SYNTHROID) 75 MCG tablet TAKE 1 TABLET BY MOUTH DAILY 30 tablet 3   ??? gabapentin (NEURONTIN) 300 MG capsule TAKE ONE CAPSULE BY MOUTH EVERY DAY WITH SUPPER (Patient taking differently: Take 300 mg by mouth nightly. ) 90 capsule 1   ??? omeprazole (PRILOSEC) 20 MG delayed release capsule TAKE ONE CAPSULE BY MOUTH EVERY DAY (Patient taking differently: as needed ) 90 capsule 0   ??? potassium chloride (KLOR-CON M) 10 MEQ extended release tablet Take 1 tablet by mouth daily (Patient taking differently: Take 10 mEq by mouth daily as needed (use when you take the water pill) ) 60 tablet 1   ??? melatonin 3 MG TABS tablet Take 3 mg by mouth nightly      ??? aspirin 81 MG tablet Take 81 mg by mouth daily.       Current Facility-Administered Medications   Medication Dose Route Frequency Provider Last Rate Last Dose   ??? 0.9 % sodium chloride infusion   Intravenous Continuous Gay Filler, MD 75 mL/hr at  12/25/18 [phone]     ??? sodium chloride flush 0.9 % injection 10 mL  10 mL Intravenous 2 times per day Gay Filler, MD       ??? sodium chloride flush 0.9 % injection 10 mL  10 mL Intravenous PRN Gay Filler, MD       ??? vancomycin (VANCOCIN) 1500 mg in dextrose 5 % 250 mL IVPB  1,500 mg Intravenous Once Delene Loll, MD         Vital Signs (Current)   Vitals:    12/25/18 0837   BP: (!) 170/70   Pulse:    Resp:    Temp:    SpO2:      Vital Signs Statistics (for past 48 hrs)     Temp  Avg: 97 ??F (36.1 ??C)  Min: 97 ??F (36.1 ??C)   Min taken time: 12/25/18 0834  Max: 97 ??F (36.1 ??C)   Max taken time: 12/25/18 0834  Pulse  Avg: 67  Min: 71   Min taken time: 12/25/18 0834  Max: 13   Max taken time: 12/25/18 0834  Resp  Avg: 16  Min: 25   Min taken time: 12/25/18 0834  Max: 79   Max taken time: 12/25/18 0834  BP  Min: 170/70   Min taken time: 12/25/18 0837  Max: 170/70   Max taken time: 12/25/18 0837  SpO2  Avg: 99 %  Min: 99 %   Min taken time: 12/25/18 0834  Max: 99 %   Max taken time: 12/25/18 0834    BP Readings from Last 3 Encounters:   12/25/18 (!) 170/70   12/18/18 138/82   11/10/18 136/86     BMI  Body mass index is 35.33 kg/m??.  Estimated body mass index is 35.33 kg/m?? as calculated from the following:    Height as of this encounter: 5\' 1"  (1.549 m).    Weight as of this encounter: 187 lb (84.8 kg).    CBC  Lab Results   Component Value Date    WBC 5.3 11/12/2018    RBC 3.87 11/12/2018    RBC 4.72 10/26/2015    HGB 11.3 11/12/2018    HCT 34.7 11/12/2018    MCV 89.6 11/12/2018    RDW 14.5 11/12/2018    PLT 214 11/12/2018     CMP    Lab Results   Component Value Date    NA 141 12/18/2018    K 4.1 12/18/2018    CL 103 12/18/2018    CO2 25 12/18/2018    BUN 16 12/18/2018    CREATININE 1.2 12/18/2018    GFRAA 52 12/18/2018    GFRAA 48 10/11/2011    AGRATIO 1.9 11/12/2018    LABGLOM 43 12/18/2018    LABGLOM 47.2 08/15/2009    GLUCOSE 114 12/18/2018    GLUCOSE 109 10/26/2015    PROT 6.3 11/12/2018    PROT 7.3  10/26/2015    CALCIUM 9.5 12/18/2018    BILITOT 0.4 11/12/2018    ALKPHOS 78 11/12/2018    AST 18 11/12/2018    ALT 12 11/12/2018     BMP    Lab Results   Component Value Date    NA 141 12/18/2018    K 4.1 12/18/2018    CL 103 12/18/2018    CO2 25 12/18/2018    BUN 16 12/18/2018    CREATININE 1.2 12/18/2018    CALCIUM 9.5 12/18/2018    GFRAA 52 12/18/2018    GFRAA 48 10/11/2011    LABGLOM 43 12/18/2018    LABGLOM 47.2 08/15/2009    GLUCOSE 114 12/18/2018    GLUCOSE 109 10/26/2015     POCGlucose  No results for input(s): GLUCOSE in the last 72 hours.   Coags  No results found for: PROTIME, INR, APTT  HCG (If Applicable) No results found for: PREGTESTUR, PREGSERUM, HCG, HCGQUANT   ABGs No results found for: PHART, PO2ART, PCO2ART, HCO3ART, BEART, O2SATART   Type & Screen (If Applicable)  No results found for: LABABO, LABRH                         BMI: Wt Readings from Last 3 Encounters:       NPO Status:   Date of last liquid consumption: 12/24/18   Time of last liquid consumption: 1900   Date of last solid food consumption: 12/24/18      Time of last solid consumption: 1900       Anesthesia Evaluation  Patient summary reviewed   history of anesthetic complications: PONV.  Airway: Mallampati: III  TM distance: >3 FB   Neck ROM: full   Dental:    (+) partials      Pulmonary:Negative Pulmonary ROS and normal exam                               Cardiovascular:  Exercise tolerance: good (>4 METS),   (+) hypertension:,       ECG reviewed  Rhythm: regular  Rate: normal           Beta Blocker:  Not on Beta Blocker         Neuro/Psych:   Negative Neuro/Psych ROS              GI/Hepatic/Renal:   (+) GERD:,           Endo/Other:    (+) hypothyroidism::., .  Abdominal:   (+) obese,         Vascular:   + DVT (Hx), .                                     Anesthesia Plan      general     ASA 3       Induction: intravenous.    MIPS: Postoperative opioids intended and Prophylactic antiemetics administered.  Anesthetic  plan and risks discussed with patient and child/children.      Plan discussed with CRNA.              This pre-anesthesia assessment may be used as a history and physical.    DOS STAFF ADDENDUM:    Pt seen and examined, chart reviewed (including anesthesia, drug and allergy history).  No interval changes to history and physical examination.  Anesthetic plan, risks, benefits, alternatives, and personnel involved discussed with patient. Questions and concerns addressed.  Patient(family) verbalized an understanding and agrees to proceed.      Sonia Baller, MD  December 25, 2018  8:53 AM

## 2018-12-25 NOTE — Discharge Instructions (Signed)
Post-Operative Instructions    Wrist & Hand Reconstruction:    ? Keep hand strictly elevated with fingers above eye-level to control swelling and pain.    NOTE: If hand is allowed to swell, pain will be very difficult to control.  ? Keep hand and bandage clean and dry.  ? Do not change or unwrap bandage.  Please leave bandage in place until your follow-up appointment.  ? Maintain finger motion by fully straightening and fully bending your exposed fingers and/or thumb at least once an hour (while awake).  This may cause some discomfort, but will not damage surgery.  ? You should not use your operated hand for any tasks.  NO LIFTING, CARRYING OR HEAVY USE.  ? You may use an arm sling as you wish, but this is not adequate elevation to control swelling and pain.  ? You may take the prescribed pain medication as needed  OR   ? You may take over the counter medication (Tylenol, Advil, Aleve, etc.) but you should not take both together unless otherwise instructed.    ? Please call the office at [phone]  in 24 - 48 hours to schedule a follow up appointment for 7 - 10 days after surgery.  ? Please call the office at [phone]  if you have any questions or problems.           Craig B. Willis, MD             Learning About Safely Storing and Getting Rid of Opioid Pills and Patches  Why are opioid pills and patches dangerous?     Opioids are medicines used to relieve moderate to severe pain. They may be used for a short time for pain, such as after surgery. Or they may be used to relieve long-term pain. When your doctor prescribes an opioid, you're getting strong medicine. It's important to protect yourself and others from its risks. Opioid medicine can cause serious problems, and even death, if it's misused.  Children and pets are at high risk when an opioid is kept within their reach. Opioid skin patches, such as fentanyl, are the most dangerous. Even a used patch still has a high dose of medicine in it. Small  children have been killed by opioid patches they've found in the trash at home.  Opioids can also be misused or stolen. Be sure to store your medicine in a safe and secure place. When you are done using opioid medicine, get rid of it right away, and in the safest way you can.  How do you safely store opioid pills and patches?  It's important to store opioids safely so that they aren't used by the wrong person. Your pain medicine is only for you to take. If someone else takes your medicine, it can harm that person.  You can safely store your medicine. Follow these tips.   Store pills and patches up high and out of sight.  ? Keep them away from children and pets.  ? Return the container to the same place each time you take your medicine.   Try locking your opioid medicine in a cabinet.   Make sure the bottles are closed tightly.   If they have a safety cap, make sure that it's locked. Tighten the cap until you hear a click or can't twist it anymore.   Keep track of how many pills or patches you have left.   You may want to keep track in a notebook.     Let the people who live with you know about your medicine.  ? Tell them that it is only for you to take.  ? If guests have opioid medicine with them, ask them to keep it safe.  How do you safely get rid of opioid pills and patches?  If you have opioid pills or patches that you are not going to use, get rid of them right away. Do not keep your opioid medicine or opioid patches for later use.  The U.S. Food and Drug Administration (FDA) recommends that you take your opioid pills and patches to a drop-off box or take-back program that is authorized by the [place] (DEA). Your local trash and recycle center, pharmacy, or hospital may offer one of these.  If you can't get to a DEA-authorized site right away and your medicine doesn't have specific disposal information (such as flushing), you can dispose of them in your household trash using  these steps.   Take the medicine out of its container.   Mix it with something that tastes bad, such as cat litter or coffee grounds.   Place the mixture in a sealed plastic bag, and put the bag in your household trash.  Only flush your medicine down the toilet if you can't get to a DEA-approved site or if your medicine instructions state clearly to flush them. You can also go to the FDA website to see a list of medicines that should be flushed.  Take special care with used opioid patches. As soon as you peel a patch off of your skin, fold it in half with the sticky sides together. Immediately take it to a DEA-authorized site or flush it down the toilet if a DEA-authorized site isn't available in your area. Do not throw them in the trash.  Where can you go to learn more?  To learn how and where to get rid of unused medicines in your area, ask your doctor or pharmacist for help. Your local trash and recycle center may have a drop-off site. You can also look online at the [place] website ([place].TVDivision.uy) to find a disposal site near you. Or you can visit TruckOr.si and search for "unused medicine disposal."  Follow-up care is a key part of your treatment and safety. Be sure to make and go to all appointments, and call your doctor if you are having problems. It's also a good idea to know your test results and keep a list of the medicines you take.  Where can you learn more?  Go to [email or web address][email or web address] and sign in to your MyChart account. Enter [phone] in the [place] box to learn more about "Learning About Safely Storing and Getting Rid of Opioid Pills and Patches."     If you do not have an account, please click on the "Sign Up Now" link.  Current as of: November 20, 2019Content Version: 12.5   2006-2020 Healthwise[employer].   Care instructions adapted under license by [facility]. If you have questions about a medical condition or  this instruction, always ask your healthcare professional. [place] any warranty or liability for your use of this information.

## 2018-12-25 NOTE — Progress Notes (Signed)
Slowly waking up, oriented. When asked she said her surgical pain is 3/10 and tolerable. Dressing remains clean dry intact. Fingers are warm, move well, blanch well. Tolerated sitting up and po fluids and crackers well. Patient and daughter verbalized understanding of discharge instructions.

## 2018-12-25 NOTE — Anesthesia Post-Procedure Evaluation (Signed)
[facility] Department of Anesthesiology  Post-Anesthesia Note       Name:  Jodi Walls                                  Age:  78 y.o.  MRN:  [id number]     Last Vitals & Oxygen Saturation: BP (!) 173/82    Pulse 62    Temp 96.3 ??F (35.7 ??C) (Temporal)    Resp 16    Ht 5\' 1"  (1.549 m)    Wt 187 lb (84.8 kg)    SpO2 97%    BMI 35.33 kg/m??   Patient Vitals for the past 4 hrs:   BP Temp Temp src Pulse Resp SpO2 Height Weight   12/25/18 1113 (!) 173/82 96.3 ??F (35.7 ??C) Temporal 62 16 97 % -- --   12/25/18 1106 -- -- -- 65 (!) 7 97 % -- --   12/25/18 1104 (!) 166/71 -- -- 65 12 96 % -- --   12/25/18 1100 (!) 166/71 -- -- 64 11 98 % -- --   12/25/18 1051 (!) 177/81 -- -- 73 17 100 % -- --   12/25/18 1045 (!) 181/73 -- -- 63 13 99 % -- --   12/25/18 1042 (!) 159/85 -- -- 63 18 98 % -- --   12/25/18 1037 (!) 153/69 -- -- 63 15 97 % -- --   12/25/18 1035 (!) 153/69 -- -- 72 15 96 % -- --   12/25/18 1031 129/61 97 ??F (36.1 ??C) Temporal 63 16 97 % -- --   12/25/18 0837 (!) 170/70 -- -- -- -- -- -- --   12/25/18 0834 -- 97 ??F (36.1 ??C) Temporal 67 16 99 % -- --   12/25/18 0829 -- -- -- -- -- -- 5\' 1"  (1.549 m) 187 lb (84.8 kg)       Level of consciousness:  Awake, alert    Respiratory: Respirations easy, no distress. Stable.    Cardiovascular: Hemodynamically stable.    Hydration: Adequate.    PONV: Adequately managed.    Post-op pain: Adequately controlled.    Post-op assessment: Tolerated anesthetic well without complication.    Complications:  None.    Sonia Baller, MD  December 25, 2018   11:19 AM

## 2018-12-25 NOTE — Progress Notes (Signed)
From PACU. Alert and oriented. No c/o. Vss. Dressing is clean dry intact. Fingers are warm, move well, blanch well.

## 2018-12-25 NOTE — Op Note (Addendum)
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    Postoperative Diagnosis: Same.    Procedure: Left Middle Finger metacarpo-phalangeal joint silicone arthroplasty with extensor mechanism rebalancing & distal intrinsic release.    Surgeon:    Vincent Peyer. Jannifer Franklin, MD    Surgical Assistant:    Lawson Fiscal, PA-C    Anesthesia:  General with local supplementation .    Blood Loss:  Minimal.     Complications:  None.      Tourniquet Time:  29 minutes.    Indications:  Ms.  Jodi Walls  is a 78 y.o.  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 I have discussed preoperatively with her the complications, limitations, expectations, alternatives and risks of the planned surgical care.  She has voiced an understanding of our discussion.  All of her questions have been fully answered to her satisfaction, and she has provided written informed consent to proceed.  I have carefully explained to Jodi Walls the intensive course of immobilization which is required after metacarpo-phalangeal joint arthroplasty, and she has agreed to comply with such therapy as I may direct. We have also discussed pre-operatively at great length the fact that Silicone Arthroplasty is not intended or expected to restore normal motion or function.  She has been advised regarding the known failure modalities of such reconstructions, including the possibilities of infection, loosening, implant fracture or displacement, and the need for subsequent surgery or revision reconstruction.    Procedure:  After written consent was obtained and the proper operative site was identified and marked, [name]  was brought to the operating room, placed in the supine position on the operating  room table with the Left arm extended upon a hand table. General anesthetic was induced by the anesthesia service. Her Left upper extremity was prepped and draped in the usual sterile fashion.    After Eshmarch exsanguination the pneumo-tourniquet was inflated to 250 milimeters of mercury.      A dorsal incision was fashioned about  the metacarpo-phalangeal joint of the Middle Finger.  Dissection was carefully carried throught the subcutaneous tissues protecting and identifying neurovascular structures.  Care was taken to preserve the intermetacarpal venous drainage.  The extensor mechanism was identified and found to be mildly subluxed to the ulnar side.  The extensor tendon was mobilized and elevated so that it could eventually be centralized.  Next, the ulnar intrinsic distal tendon was identified and incised at its distal insertion, relieving the ulnar deviation force.  The MCP joint was entered dorsally, protecting and preserving as much dorsal capsule as was remaining.  The metacarpo-phalangeal joint was inspected and the joint surfaces were moderately eroded.      The Collateral ligament on the radial side was protected and secured with a suture of 4-0 Fiberwire for later reattachment.    Using and oscillating saw, the distal aspect of the metacarpal head was resected just distal to the collateral ligament sulcus.  Similar planar resection of the base of the Proximal Phalanx was performed.  Intramedullary access was established in the metacarpal and the canal was sequentially broached to accommodate a # 5 implant.  The Proximal Phalanx  as similarly prepared by establishing intramedullary access and the Proximal Phalanx was sequentially broached to accommodate a # 5 implant.  Trial implant was selected and assessed for size, stability and alignment. The # 5 was found to be the most appropriate fit.    A small drill hole was placed in the radial side of the base of the Proximal Phalanx and the suture of the  Radial collateral ligament was passed through this hole for later reattachment.    The selected implant was inserted with a no-touch technique and its fit was appropriate.  The suture on the Radial collateral ligament was secured with the finger in slight radial deviation.    The joint capsule was closed dorsally and the extensor tendon centralized.  It was repaired to the  Radial Sagittal Band to secure it in proper position.      The wound was irrigated copiously with sterile saline for irrigation and the pneumo-tourniquet was deflated after a period of 29 minutes elevation. Hemostasis was easily obtained with direct pressure and electrocautery.  The wound was closed with interrupted sutures in the skin. The wound was dressed with adaptic, dry sterile gauze, and a bulky, forearm based splint  was applied with the wrist in neutral and the metacarpo-phalangeal joints in full extension. The proximal interphalangeal joints were left free. [name]  was awakened from light sedation, having tolerated the procedure without apparent complication, and was returned to the recovery room in stable condition.    At the conclusion of the procedure all needle, instrument, and sponge counts were correct.               Vincent Peyer Jannifer Franklin, MD   12/25/2018, 10:35 AM

## 2018-12-31 NOTE — Telephone Encounter (Signed)
Mindy with [facility] is calling and would like the office to fax physical therapy prescription. Fax [phone]

## 2018-12-31 NOTE — Telephone Encounter (Signed)
Occupational Therapy order faxed to number provided.

## 2019-01-01 ENCOUNTER — Encounter: Attending: Clinical Neurophysiology | Primary: Family Medicine

## 2019-01-02 ENCOUNTER — Ambulatory Visit: Admit: 2019-01-02 | Discharge: 2019-02-23 | Payer: MEDICARE | Primary: Family Medicine

## 2019-01-02 ENCOUNTER — Ambulatory Visit: Admit: 2019-01-02 | Discharge: 2019-01-02 | Payer: MEDICARE | Attending: Orthopaedic Surgery | Primary: Family Medicine

## 2019-01-02 DIAGNOSIS — M19042 Primary osteoarthritis, left hand: Secondary | ICD-10-CM

## 2019-01-04 NOTE — Progress Notes (Addendum)
[name] returns today in follow-up of her recent left Middle Finger silicone MCP arthroplasty with EDC Tendon realignment which was done 1 week ago.  She has noted decreased discomfort and decreased swelling.    She  is  wearing her protective splint.      Physical Exam:  Skin incisions are healing well, without erythema, drainage or sign of infection.   Digital range of motion is not assessed due to the presence of the fresh tendon repair.  Wrist range of motion is not assessed due to the presence of the fresh tendon repair.  Sensation is normal in the Whole Hand.  Vascular examination reveals normal and good capillary refill.  Swelling is mild .  Middle Finger posture does suggest integrity of the arthroplasty and tendon repair with good alignment and rotation    She is examined today while carefully protecting the repair.    Radiographic Evaluation:  Radiographs were obtained today (3 views of the left hand).  They demonstrate excellent maintenance of alignment of the Middle Finger arthroplasty without loss of alignment or rotation.    Impression:  [name] is doing well after recent left Middle Finger silicone MCP arthroplasty with EDC Tendon realignment.  It would appear that she has not fully healed her repair.    Plan:  [name] is today returned to her appropriately applied protective cast with the injured digits maintained in a position of extension to protect the repair.  She is advised regarding the appropriate care of, wear, and use of this device.   She is also instructed in continued strict adherance to her restrictions on use.  These were discussed with her today.  We discussed the continued restrictions on the use of the injured hand & wrist and the limitations on resumption of activities.    I have asked [name] to follow-up with me in approximately 4 weeks from now for re-evaluation out of splint/cast.      She is also specifically instructed to return  to the office or call for an appointment sooner if her symptoms are changing or worsening prior to that time.

## 2019-01-26 ENCOUNTER — Encounter: Attending: Family Medicine | Primary: Family Medicine

## 2019-02-05 ENCOUNTER — Encounter

## 2019-02-05 MED ORDER — GABAPENTIN 300 MG PO CAPS
300 MG | ORAL_CAPSULE | ORAL | 1 refills | Status: DC
Start: 2019-02-05 — End: 2019-07-16

## 2019-02-05 MED ORDER — LEVOTHYROXINE SODIUM 75 MCG PO TABS
75 MCG | ORAL_TABLET | ORAL | 1 refills | Status: DC
Start: 2019-02-05 — End: 2019-07-16

## 2019-02-06 ENCOUNTER — Ambulatory Visit: Admit: 2019-02-06 | Discharge: 2019-02-06 | Payer: MEDICARE | Attending: Orthopaedic Surgery | Primary: Family Medicine

## 2019-02-06 DIAGNOSIS — M19042 Primary osteoarthritis, left hand: Secondary | ICD-10-CM

## 2019-02-07 NOTE — Progress Notes (Signed)
[name] returns today in follow-up of her recent left Middle Finger MCP silicone arthroplasty which was done 6 weeks ago.  She has noted decreased discomfort and decreased swelling.    She  is  wearing her protective splint and working with hand therapay.      Physical Exam:  Skin incisions are ffully healed , nontender.   Digital range of motion is predictably stiff from immobilization.  left Middle Finger Extensor Digitorum CommunisTendon function is Intact with 10 degrees of extensor lag.  Wrist range of motion is not stiff from immobilization.  Sensation is normal in the Middle Finger.  Vascular examination reveals normal and good capillary refill.  Swelling is mild.  Middle Finger demonstrates appropriate alignment and rotation and does suggest integrity of the arthroplasty and tendon repair(s).      Impression:  [name] is doing well after recent left Middle Finger  MCP silicone arthroplasty .  It would appear that she has not fully healed her repair.    Plan:  [name] is today returned to her therapy splint.  She is  instructed in active and gentle passive range of motion of the fingers, hand, and wrist.  These modalities were demonstrated to her today.   She is also advised regarding the gradual resumption of use of the hand as motion and comfort allow. We discussed the lifting of restrictions on the use of the injured hand & wrist and the resumption of activities with time.    I have asked [name] to follow-up with me in approximately 4 weeks from now for re-evaluation if she is unable to regain full and painless motion and function.      She is also specifically instructed to return to the office or call for an appointment sooner if her symptoms are changing or worsening prior to that time.

## 2019-02-13 MED ORDER — FUROSEMIDE 40 MG PO TABS
40 MG | ORAL_TABLET | ORAL | 0 refills | Status: DC
Start: 2019-02-13 — End: 2019-05-21

## 2019-03-13 ENCOUNTER — Ambulatory Visit: Admit: 2019-03-13 | Discharge: 2019-03-13 | Payer: MEDICARE | Attending: Orthopaedic Surgery | Primary: Family Medicine

## 2019-03-13 DIAGNOSIS — M19042 Primary osteoarthritis, left hand: Secondary | ICD-10-CM

## 2019-03-13 NOTE — Patient Instructions (Signed)
Thank you for choosing [facility] for your Hand and Upper Extremity needs.  If we can be of any further assistance to you, please do not hesitate to contact us.    Office Phone Number:  [phone]  or  [phone]

## 2019-03-13 NOTE — Progress Notes (Signed)
[name] returns today in follow-up of her recent left Middle Finger metacarpo-phalangeal joint  Arthroplasty which was done 10 weeks ago.  She has noted decreased discomfort and decreased swelling.    She notes no symptoms of numbness, tingling, no symptoms related to perfusion.    Physical Exam:  Skin incisions   are fully healed , nontender.   Digital range of motion is improved with approximately 20* active metacarpo-phalangeal joint flexion and no significant extensor lag, in the Middle Finger otherwise normal  Wrist range of motion has been completely recovered.  Sensation is normal in the Whole Hand.  Vascular examination reveals normal and good capillary refill.  Swelling is mild.  Clinical alignment and rotation are within an acceptable range.        Impression:  [name] is doing well after recent left Middle Finger metacarpo-phalangeal joint  Arthroplasty.     Plan:  [name] has healed her reconstruction at this time.      She is today is released from her protective forearm based thumb spica orthosis and is also instructed in  work on Active & Passive range of motion of the digits, wrist, & elbow.  These modalities were demonstrated to her today.  We discussed the return to use of the injured hand & wrist and the unrestricted resumption of activities as she feels able.    We discussed the option of pursuing formalized hand therapy and a prescription  was not indicated.    I have asked [name] to follow-up with me in approximately 8 weeks from now for re-evaluation unless she has regained full and painless use of the reconstructed hand.      She is also specifically instructed to return to the office or call for an appointment sooner if her symptoms are changing or worsening prior to that time.

## 2019-05-14 ENCOUNTER — Encounter

## 2019-05-14 ENCOUNTER — Ambulatory Visit
Admit: 2019-05-14 | Discharge: 2019-05-14 | Payer: MEDICARE | Attending: Clinical Neurophysiology | Primary: Family Medicine

## 2019-05-14 DIAGNOSIS — R200 Anesthesia of skin: Secondary | ICD-10-CM

## 2019-05-14 NOTE — Progress Notes (Signed)
Richardo Hanks, M.D.  [facility]  Board Certified in [address], [place]  [address]    EMG / NERVE CONDUCTION STUDY    PATIENT:     Jodi Walls      DATE OF EMG:    05/14/2019    DATE OF BIRTH:   04-29-1941       REASON FOR EMG:   Bilateral leg numbness    REFERRING PHYSICIAN:  Oletta Lamas, MD  [address] [place] [address]    SUMMARY:   Bilateral peroneal motor nerve studies are normal amplitudes and slow conduction velocities.  Bilateral posterior tibial motor nerve studies had low amplitudes and slow conduction velocities.  Bilateral superficial peroneal sensory nerve studies were not recordable.  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          _____________________________  Richardo Hanks, M.D.  Electromyographer/Neurologist

## 2019-05-14 NOTE — Other (Signed)
Please call the patient and let her know that her EMG shows neuropathy which could be possibly due to her vitamin B1 and vitamin B6 deficiency.  I will still recommend seeing a neurologist for further evaluation.

## 2019-05-21 ENCOUNTER — Ambulatory Visit: Admit: 2019-05-21 | Discharge: 2019-05-21 | Payer: MEDICARE | Attending: Family Medicine | Primary: Family Medicine

## 2019-05-21 DIAGNOSIS — I10 Essential (primary) hypertension: Secondary | ICD-10-CM

## 2019-05-21 MED ORDER — VITAMIN B-6 50 MG PO TABS
50 | ORAL_TABLET | Freq: Every day | ORAL | 1 refills | Status: DC
Start: 2019-05-21 — End: 2019-08-17

## 2019-05-21 MED ORDER — THIAMINE HCL 100 MG PO TABS
100 | ORAL_TABLET | Freq: Every day | ORAL | 1 refills | 30.00 days | Status: DC
Start: 2019-05-21 — End: 2019-08-17

## 2019-05-21 MED ORDER — TRIAMTERENE-HCTZ 37.5-25 MG PO CAPS
37.5-25 | ORAL_CAPSULE | Freq: Every day | ORAL | 1 refills | Status: DC
Start: 2019-05-21 — End: 2019-06-24

## 2019-05-21 NOTE — Patient Instructions (Signed)
Change the lasix and potassium to something called triamterene/hydrochlorothiazie to take one a day for the blood pressure  Call and let me know how it is doing  See [name] back in 6 weeks to check the blood pressure with the medical assistant  Do take the vitamins   See me in 5 months

## 2019-05-21 NOTE — Progress Notes (Signed)
 Jodi Walls (DOB:  22-Aug-1940) is a 79 y.o. female,, here for evaluation of the following chief complaint(s):  Check-Up (hypertension - pt is fasting)      ASSESSMENT/PLAN:   Diagnosis Orders   1. Essential hypertension, benign     2. Acquired hypothyroidism     3. Vitamin B1 deficiency     4. Vitamin B6 deficiency       Orders Placed This Encounter   Medications   . vitamin B-6 (PYRIDOXINE) 50 MG tablet     Sig: Take 1 tablet by mouth daily     Dispense:  90 tablet     Refill:  1   . thiamine  (GNP VITAMIN B-1) 100 MG tablet     Sig: Take 1 tablet by mouth daily     Dispense:  90 tablet     Refill:  1   . triamterene -hydroCHLOROthiazide (DYAZIDE) 37.5-25 MG per capsule     Sig: Take 1 capsule by mouth daily     Dispense:  90 capsule     Refill:  1       We will get blood work at next visit   Overall well  She will change medicine to see if tolerated better    She see several specialist including dr Gerhard Knuckles for the bp and kidney function  I reviewed last note including the heme onc note form last year  She is seeing dr Dorothey Gate  will refill the vitamin    Change the lasix  and potassium to something called triamterene /hydrochlorothiazie to take one a day for the blood pressure  Call and let me know how it is doing  See us  back in 6 weeks to check the blood pressure with the medical assistant  Do take the vitamins   See me in 5 months   No follow-ups on file.    SUBJECTIVE/OBJECTIVE:  Patient presents with:  Check-Up: hypertension - pt is fasting    She is well  She is seeing ortho and also neuro dr Burrell Casa  She is seeing rheumatology as well    She has no c/o  She does not use the lasix  regular as it causes cramps  When she does take it,  only one half    She has seen heme onc for the anemia  And hx of breast ca  Family is well  meds the same     Date of Birth:  [birth year]    Date of Visit:  05/21/2019     -- Pcn (Penicillins) -- Swelling    --  Face and hands swelled   -- Ace Inhibitors -- Other (See Comments)    --   cough   -- Atorvastatin -- Other (See Comments)    --  Muscle aches   -- Zithromax (Azithromycin) -- Swelling   -- Codeine -- Nausea And Vomiting    Current Outpatient Medications:  .  furosemide  (LASIX ) 40 MG tablet, TAKE 1 TABLET BY MOUTH DAILY AS DIRECTED AS NEEDED FOR SWELLING, Disp: 90 tablet, Rfl: 0  .  levothyroxine  (SYNTHROID ) 75 MCG tablet, TAKE 1 TABLET BY MOUTH DAILY, Disp: 90 tablet, Rfl: 1  .  gabapentin  (NEURONTIN ) 300 MG capsule, TAKE ONE CAPSULE BY MOUTH EVERY DAY WITH SUPPER, Disp: 90 capsule, Rfl: 1  .  diclofenac  sodium (VOLTAREN ) 1 % GEL, Apply 4 grams to lower extremity joints and 2 grams to upper extremity joints as needed 4 times/day, Disp: 500 g, Rfl: 5  .  vitamin B-1 (THIAMINE )  100 MG tablet, Take 1 tablet by mouth daily, Disp: 30 tablet, Rfl: 2  .  vitamin B-6 (PYRIDOXINE) 50 MG tablet, Take 1 tablet by mouth daily, Disp: 30 tablet, Rfl: 2  .  omeprazole  (PRILOSEC) 20 MG delayed release capsule, TAKE ONE CAPSULE BY MOUTH EVERY DAY (Patient taking differently: as needed ), Disp: 90 capsule, Rfl: 0  .  potassium chloride  (KLOR-CON  M) 10 MEQ extended release tablet, Take 1 tablet by mouth daily (Patient taking differently: Take 10 mEq by mouth daily as needed (use when you take the water pill) ), Disp: 60 tablet, Rfl: 1  .  melatonin 3 MG TABS tablet, Take 3 mg by mouth nightly , Disp: , Rfl:   .  aspirin  81 MG tablet, Take 81 mg by mouth daily., Disp: , Rfl:     No current facility-administered medications for this visit.       --------------------------               05/21/19                     1019        --------------------------   BP:          130/82        Site:    Left Upper Arm    Position:    Sitting        Cuff Size:  Large Adult      Pulse:         72          Temp:   97 F (36.1 C)    TempSrc:    Temporal       Weight: 185 lb (83.9 kg)   Height: 5' 1 (1.549 m)   --------------------------  Body mass index is 34.96 kg/m.     Wt Readings  from Last 3 Encounters:  05/21/19 : 185 lb (83.9 kg)  03/13/19 : 187 lb (84.8 kg)  02/06/19 : 187 lb (84.8 kg)    BP Readings from Last 3 Encounters:  05/21/19 : 130/82  12/25/18 : (!) 97/52  12/25/18 : (!) 165/81                  Review of Systems   Constitutional: Negative for appetite change, chills, fever and unexpected weight change.   Respiratory: Negative for cough, chest tightness and shortness of breath.    Cardiovascular: Negative for chest pain, palpitations and leg swelling.   Gastrointestinal: Negative for abdominal distention, abdominal pain, blood in stool, constipation, diarrhea, nausea and vomiting.   Genitourinary: Negative for difficulty urinating and hematuria.   Neurological: Negative for dizziness and headaches.       Physical Exam  Constitutional:       General: She is not in acute distress.     Appearance: Normal appearance. She is well-developed. She is not ill-appearing or diaphoretic.   HENT:      Head: Normocephalic and atraumatic.   Neck:      Musculoskeletal: Neck supple.      Thyroid: No thyroid mass or thyromegaly.   Cardiovascular:      Rate and Rhythm: Normal rate and regular rhythm.      Heart sounds: Normal heart sounds. No murmur. No friction rub. No gallop.    Pulmonary:      Effort: Pulmonary effort is normal. No tachypnea, accessory muscle usage or respiratory distress.      Breath sounds: Normal breath sounds. No  decreased breath sounds, wheezing, rhonchi or rales.   Lymphadenopathy:      Cervical: No cervical adenopathy.      Upper Body:      Right upper body: No supraclavicular adenopathy.      Left upper body: No supraclavicular adenopathy.   Skin:     General: Skin is warm and dry.      Coloration: Skin is not pale.   Neurological:      Mental Status: She is alert.                 An electronic signature was used to authenticate this note.    --Orysia Blas, MD

## 2019-05-29 NOTE — Telephone Encounter (Signed)
Stop  If sx do not resolve promptly call and go to the ER  She may resume her lasix and potassium instead

## 2019-05-29 NOTE — Telephone Encounter (Signed)
[phone] - Left msg for Thirza to return call.

## 2019-05-29 NOTE — Telephone Encounter (Signed)
Deerica advised and verbalized understanding.

## 2019-05-29 NOTE — Telephone Encounter (Signed)
Pt calling is having trouble taking triamterene-hydroCHLOROthiazide (DYAZIDE) 37.5-25 MG per capsule. She is having SOB, cramping, headaches. Pt has taking this for a week. Please advise on what to do.    [id number]

## 2019-06-17 ENCOUNTER — Ambulatory Visit
Admit: 2019-06-17 | Discharge: 2019-06-17 | Payer: MEDICARE | Attending: Clinical Neurophysiology | Primary: Family Medicine

## 2019-06-17 DIAGNOSIS — G63 Polyneuropathy in diseases classified elsewhere: Secondary | ICD-10-CM

## 2019-06-17 NOTE — Progress Notes (Signed)
NEUROLOGY CONSULTATION     Chief Complaint   Patient presents with   . New Patient     Jodi Walls for peripheral neuropathy, couple years now       HISTORY OF PRESENT ILLNESS :    Jodi Walls is a 79 y.o. female who is referred by Dr. Deatra Walls   History was obtained from patient  Patient was referred for evaluation of peripheral neuropathy.  Patient stated the symptoms started approximately 3 years ago.  Onset was gradual and the symptoms are getting worse.  Patient complains of tingling and numbness in her hands and feet.  She is also feeling weaker.  She has leg cramps.  She was seen by rheumatologist who did a detailed work-up.  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  Her vitamin B12 level was borderline.  Patient is now on replacement vitamin therapy.  Patient has not noticed any significant improvement.  Patient has never had any gastric bypass surgery or any similar bowel surgery.  Comorbidities include hypothyroidism, breast cancer back in 95 without any chemotherapy and arthritis    REVIEW OF SYSTEMS    Constitutional:  []    Chills   []   Fatigue   []   Fevers   []   Malaise   []   Weight loss     [x]  Denies all of the above    Eyes:  []   Double vision   []   Blurry vision     [x]  Denies all of the above    Ears, nose, mouth, throat, and face:   []  Hearing loss    [x]    Hoarseness      []   Snoring    []   Tinnitus       []  Denies all of the above     Respiratory:   []   Cough    [x]   Shortness of breath         []  Denies all of the above     Cardiovascular:   []   Chest pain    []   Exertional chest pressure/discomfort           []  Palpitations    []   Syncope     [x]  Denies all of the above    Gastrointestinal:   []   Abdominal pain   []   Constipation    []   Diarrhea    []    Dysphagia                      [x]  Denies all of the above    Genitourinary:      []   Frequency   []   Hematuria     []   Urinary incontinence           [x]   Denies all of the above     Hematologic/lymphatic:  []   Bleeding    [x]   Easy bruising   []   Anemia  []  Denies all of the above     Musculoskeletal:   []  Back pain       []   Myalgias    []   Neck pain           [x]  Denies all of the above    Neurological: As noted in HPI    Behavioral/Psych:   []  Anxiety    []   Depression     []   Mood swings     [x]  Denies all of the above     Endocrine:   []   Temperature intolerance     []  Fatigue      [x]  Denies all of the above     Allergic/Immunologic:   []  Hay fever    [x]  Denies all of the above     Past Medical History:   Diagnosis Date   . Anemia    . Arthritis    . Cancer ([place]) 1995    hx of breast cancer--no chemo or radiation needed   . GERD (gastroesophageal reflux disease)    . Hx of blood clots 2001    after gallbladder surgery--blood clot in arm where IV had been   . Hypothyroidism    . Kidney failure     stage 3   . PONV (postoperative nausea and vomiting)    . Wears glasses     reading     Family History   Problem Relation Age of Onset   . Cancer Mother         lung    . Cancer Father         bladder   . Heart Disease Father    . High Blood Pressure Father    . Cancer Maternal Aunt         ovarian   . Cancer Paternal Cousin         ovarian     Social History     Socioeconomic History   . Marital status: Widowed     Spouse name: None   . Number of children: None   . Years of education: None   . Highest education level: None   Occupational History   . None   Social Needs   . Financial resource strain: None   . Food insecurity     Worry: None     Inability: None   . Transportation needs     Medical: None     Non-medical: None   Tobacco Use   . Smoking status: Never Smoker   . Smokeless tobacco: Never Used   Substance and Sexual Activity   . Alcohol use: Yes     Comment: social-rare. once a year   . Drug use: No   . Sexual activity: Not Currently   Lifestyle   . Physical activity     Days per week: None     Minutes per session: None   . Stress: None   Relationships   .  Social Product manager on phone: None     Gets together: None     Attends religious service: None     Active member of club or organization: None     Attends meetings of clubs or organizations: None     Relationship status: None   . Intimate partner violence     Fear of current or ex partner: None     Emotionally abused: None     Physically abused: None     Forced sexual activity: None   Other Topics Concern   . None   Social History Narrative   . None       PHYSICAL EXAMINATION:  BP (!) 151/88   Pulse 78   Ht 5\' 1"  (1.549 m)   Wt 185 lb (83.9 kg)   BMI 34.96 kg/m   Appearance: Well appearing, well nourished and in no distress  Mental Status Exam: Patient is alert, oriented to person, place and time.   Recent and remote memory is normal  Fund of Knowledge is normal  Attention/concentration  is normal.   Speech : No dysarthria  Language : No aphasia  Funduscopic Exam: sharp disc margins  Cranial Nerves:   II: Visual fields:  Full to confrontation  III: Pupils:  equal, round, reactive to light  III,IV,VI: Extra Ocular Movements are intact. No nystagmus  V: Facial sensation is intact to pin prick and light touch  VII: Facial strength and movements: intact and symmetric smile,cheek puffing and eyebrow elevation  VIII: Hearing:  Intact to finger rub bilaterally  IX: Palate  elevation is symmetric  XI: Shoulder shrug is intact  XII: Tongue movements are normal  Motor:  Muscle tone and bulk are normal.   Strength is symmetrical 5/5 in all four extremities.  Sensory: Intact to light touch and  pin prick in all four extremities  Coordination:  Normal  Finger to Nose and Heel to Shin bilaterally    .Reflexes:  DTR +2 and symmetric bilaterally  Plantar response: Flexor bilaterally  Gait: Gait and station is normal. Patient can toe/ heel and tandem walk without difficulty  Romberg: negative  Vascular: No carotid bruit bilaterally        DATA:  LABS:  General Labs:    CBC:   Lab Results   Component Value Date    WBC  5.3 11/12/2018    RBC 3.87 11/12/2018    RBC 4.72 10/26/2015    HGB 11.3 11/12/2018    HCT 34.7 11/12/2018    MCV 89.6 11/12/2018    MCH 29.3 11/12/2018    MCHC 32.7 11/12/2018    RDW 14.5 11/12/2018    PLT 214 11/12/2018    MPV 10.0 11/12/2018     BMP:    Lab Results   Component Value Date    NA 141 12/18/2018    K 4.1 12/18/2018    CL 103 12/18/2018    CO2 25 12/18/2018    BUN 16 12/18/2018    LABALBU 4.1 11/12/2018    CREATININE 1.2 12/18/2018    CALCIUM 9.5 12/18/2018    GFRAA 52 12/18/2018    GFRAA 48 10/11/2011    LABGLOM 43 12/18/2018    LABGLOM 47.2 08/15/2009    GLUCOSE 114 12/18/2018    GLUCOSE 109 10/26/2015       IMPRESSION :  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  Her vitamin B12 level was also borderline low.  We have to consider copper deficiency in this patient especially with multiple other vitamin deficiencies.  She denies any history suggestive of malabsorption syndrome and has not had any previous gastric bypass surgery  She has known hypothyroidism as well as arthritis  EMG and nerve conduction studies of the lower extremities done in our office sometime back and showed mild to moderate peripheral neuropathy in the lower extremities.  Medical records from her rheumatologist and primary physician were reviewed.  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  This can also contribute to the neuropathy and needs to be followed up.    RECOMMENDATIONS :  Discussed with patient  Patient states that she has been on all these replacement vitamins for more than 6 months now without any improvement.  I suspect that it is reasonable to recheck the vitamin levels again to see if she is absorbing them or if she has had them on absorption problem.  In addition to getting B1 B6 and B12 vitamin levels I will also check a serum  copper level.  I will also get repeat serum protein  immunofixation levels.  I will see her back in 2 months for follow-up.        Please note a portion of this chart was generated using dragon dictation software. Although every effort was made to ensure the accuracy of this automated transcription, some errors in transcription may have occurred.

## 2019-06-19 ENCOUNTER — Encounter

## 2019-06-19 LAB — VITAMIN B12: Vitamin B-12: 1860 pg/mL — ABNORMAL HIGH (ref 211–911)

## 2019-06-22 LAB — IMMUNOFIXATION SERUM PROFILE
Albumin: 3.6 g/dL (ref 3.1–4.9)
Alpha-1-Globulin: 0.3 g/dL (ref 0.2–0.4)
Alpha-2-Globulin: 0.9 g/dL (ref 0.4–1.1)
Beta Globulin: 1 g/dL (ref 0.9–1.6)
Gamma Globulin: 0.9 g/dL (ref 0.6–1.8)
Total Protein: 6.6 g/dL (ref 6.4–8.2)

## 2019-06-22 LAB — COPPER, SERUM: Copper: 104.1 ug/dL (ref 80.0–155.0)

## 2019-06-23 LAB — VITAMIN B1: Vitamin B1, Plasma: 102 nmol/L — ABNORMAL HIGH (ref 4–15)

## 2019-06-24 ENCOUNTER — Encounter

## 2019-06-24 ENCOUNTER — Ambulatory Visit: Admit: 2019-06-24 | Discharge: 2019-06-24 | Payer: MEDICARE | Attending: Internal Medicine | Primary: Family Medicine

## 2019-06-24 DIAGNOSIS — M1990 Unspecified osteoarthritis, unspecified site: Secondary | ICD-10-CM

## 2019-06-24 LAB — METHYLMALONIC ACID, SERUM: Methylmalonic Acid: 0.31 umol/L (ref 0.00–0.40)

## 2019-06-24 MED ORDER — HYDROXYCHLOROQUINE SULFATE 200 MG PO TABS
200 MG | ORAL_TABLET | Freq: Every day | ORAL | 5 refills | Status: DC
Start: 2019-06-24 — End: 2020-01-13

## 2019-06-24 MED ORDER — DULOXETINE HCL 30 MG PO CPEP
30 MG | ORAL_CAPSULE | Freq: Every day | ORAL | 3 refills | Status: DC
Start: 2019-06-24 — End: 2019-08-17

## 2019-06-24 MED ORDER — PREDNISONE 5 MG PO TABS
5 MG | ORAL_TABLET | ORAL | 0 refills | Status: DC
Start: 2019-06-24 — End: 2019-09-22

## 2019-06-24 NOTE — Patient Instructions (Signed)
Start prednisone taper   Start plaquenil and cymbalta   Labs

## 2019-06-24 NOTE — Progress Notes (Signed)
06/24/2019     Patient Name: Jodi Walls  DOB: October 10, 1940  MEDICAL RECORD [id number]    MEDICATIONS  Current Outpatient Medications   Medication Sig Dispense Refill   . hydroxychloroquine (PLAQUENIL) 200 MG tablet Take 1 tablet by mouth daily 30 tablet 5   . predniSONE (DELTASONE) 5 MG tablet 2 tabs for 10 days,1.5 tab for 10 days,1 tab for 10 days, 0.5 tab for 10 days and stop 50 tablet 0   . DULoxetine (CYMBALTA) 30 MG extended release capsule Take 1 capsule by mouth daily 30 capsule 3   . vitamin D (CHOLECALCIFEROL) 25 MCG (1000 UT) TABS tablet Take 1 tablet by mouth daily     . potassium chloride (KLOR-CON) 10 MEQ extended release tablet Take 10 mEq by mouth 2 times daily     . vitamin B-6 (PYRIDOXINE) 50 MG tablet Take 1 tablet by mouth daily 90 tablet 1   . thiamine (GNP VITAMIN B-1) 100 MG tablet Take 1 tablet by mouth daily 90 tablet 1   . levothyroxine (SYNTHROID) 75 MCG tablet TAKE 1 TABLET BY MOUTH DAILY 90 tablet 1   . gabapentin (NEURONTIN) 300 MG capsule TAKE ONE CAPSULE BY MOUTH EVERY DAY WITH SUPPER 90 capsule 1   . diclofenac sodium (VOLTAREN) 1 % GEL Apply 4 grams to lower extremity joints and 2 grams to upper extremity joints as needed 4 times/day 500 g 5   . omeprazole (PRILOSEC) 20 MG delayed release capsule TAKE ONE CAPSULE BY MOUTH EVERY DAY (Patient taking differently: as needed ) 90 capsule 0   . melatonin 3 MG TABS tablet Take 3 mg by mouth nightly      . aspirin 81 MG tablet Take 81 mg by mouth daily.       No current facility-administered medications for this visit.        ALLERGIES  Allergies   Allergen Reactions   . Pcn [Penicillins] Swelling     Face and hands swelled   . Triamterene      Severe stomach pain,SOB,severe cramps throughout her body   . Ace Inhibitors Other (See Comments)     cough   . Atorvastatin Other (See Comments)     Muscle aches   . Zithromax [Azithromycin] Swelling   . Codeine Nausea And Vomiting         Comments  No specialty comments available.    Background  history:  Jodi Walls is a 79 y.o. female breast cancer status post lumpectomy, hypertension, osteoarthritis, hypothyroidism, CKD 3 who is being seen for follow up evaluation of  joint pain.  She is complaining of pain in her hands and feet.  We started 2 years ago.  Symptoms have progressively worsened over the past 1 year.  She has pain on daily basis, 10 out of 10.  She is swelling in her knuckles and feet.  She has morning stiffness lasting for 1 hour.  She has difficulty opening doorknobs and jars.  She has developed weakness in her hands and is dropping things.  She takes Tylenol 1000 mg twice a day with minimal relief.  She also takes Aleve as needed.  She is unable to take oral NSAIDs due to CKD 3.  She has a history of left knee replacement.  She has osteoarthritis in the right knee as well.  Right knee cracks and pops.  Pain in the right knee gets worse going up and down the steps and standing from the sitting position.  She  had a trauma to the left hand in October 2019.  Since then left third MCP joint stays swollen and painful.  She had x-ray of the left hand which did not show any fracture.  She denies psoriasis, inflammatory bowel disease, inflammatory back pain, dactylitis, enthesitis, tenosynovitis or uveitis.  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  Her mother had arthritis  She had work-up with PCP which showed negative RF, ESR and normal TSH.    Interim history: She presents for follow-up of osteoarthritis, CPPD.  She takes Tylenol 1000 mg twice a day and diclofenac gel with minimal relief.  She continues to complain of pain, swelling and stiffness in the joints.  Symptoms are worse in her hands and feet.  She had left third MCP joint arthroplasty.  She also saw a neurologist for peripheral neuropathy.  Additional work-up was ordered which is in process.  She takes gabapentin 300 mg at bedtime but she still has tingling and numbness in her hands  and feet.  She has vitamin B1 and vitamin B6 deficiency.  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  Work-up came back negative for rheumatoid arthritis.  Hand and foot x-rays showed moderate to severe degenerative changes with CPPD.  She denies any pseudogout flareups.  Work-up was ordered to rule out secondary causes of pseudogout she has not obtained it yet.  Marland Kitchen  HPI  Review of Systems    REVIEW OF SYSTEMS:   Constitutional: No unanticipated weight loss or fevers.   Integumentary: No rash, photosensitivity, malar rash, livedo reticularis, alopecia and Raynaud's symptoms, sclerodactyly, skin tightening  Eyes: negative for visual disturbance and persistent redness, discharge from eyes   ENT: - No tinnitus, loss of hearing, vertigo, or recurrent ear infections.  - No history of nasal/oral ulcers.  - No history of dry eyes/dry mouth  Cardiovascular: No history of pericarditis, chest pain or murmur or palpitations  Respiratory: No shortness of breath, cough or history of interstitial lung disease.No history of pleurisy. No history of tuberculosis or atypical infections.  Gastrointestinal: No history of heart burn, dysphagia or esophageal dysmotility. No change in bowel habits or any inflammatory bowel disease.  Genitourinary: No history miscarriages.  Hematologic/Lymphatic: No abnormal bruising or bleeding, blood clots or swollen lymph nodes.  Neurological: No history of headaches, seizure or focal weakness. No history of neuropathies, paresthesias or hyperesthesias, facial droop, diplopia  Psychiatric: No history of bipolar disease, anxiety, depression  Endocrine: Denies any polyuria, polydipsia and osteoporosis  Allergic/Immunologic: No nasal congestion or hives.        I have reviewed patients Past medical History, Social History and Family History as mentioned in her chart and this remains unchanged fromprevious.    Past Medical History:   Diagnosis Date   . Anemia     . Arthritis    . Cancer ([place]) 1995    hx of breast cancer--no chemo or radiation needed   . GERD (gastroesophageal reflux disease)    . Hx of blood clots 2001    after gallbladder surgery--blood clot in arm where IV had been   . Hypothyroidism    . Kidney failure     stage 3   . PONV (postoperative nausea and vomiting)    . Wears glasses     reading     Past Surgical History:   Procedure Laterality Date   . APPENDECTOMY     . ARTHROPLASTY Left 12/25/2018  LEFT MIDDLE FINGER METACARPO-PHALANGEAL JOINT SYNOVECTOMY, LIGAMENT REBALANCING, EXTENSOR TENDON CENTRALIZATION AND SILICONE METACARPOPHALANGEAL JOINT ARTHROPLASTY performed by Delene Loll, MD at [facility]   . BACK SURGERY      r/t to herniated disc in lower back, neck area--screws and rods in place   . BREAST BIOPSY      left breast biopsy   . BREAST SURGERY  1995    right mastectomy   . CHOLECYSTECTOMY  2001   . COLONOSCOPY  04/12/2015    Esophagogastroduodenoscopy with biopsy, Colonoscopy, diagnostic. no findings except for diverticula and check in 10 years   . HYSTERECTOMY      TAH-BSO. no  cancer   . JOINT REPLACEMENT Left     left total knee replacement   . LIPOMA RESECTION      right side below right breast   . MASTECTOMY, RADICAL  1995   . UPPER GASTROINTESTINAL ENDOSCOPY  04/12/2015    and colonoscopy. hiatal hernia and gastritis     Social History     Socioeconomic History   . Marital status: Widowed     Spouse name: Not on file   . Number of children: Not on file   . Years of education: Not on file   . Highest education level: Not on file   Occupational History   . Not on file   Social Needs   . Financial resource strain: Not on file   . Food insecurity     Worry: Not on file     Inability: Not on file   . Transportation needs     Medical: Not on file     Non-medical: Not on file   Tobacco Use   . Smoking status: Never Smoker   . Smokeless tobacco: Never Used   Substance and Sexual Activity   . Alcohol use: Yes     Comment: social-rare. once a year    . Drug use: No   . Sexual activity: Not Currently   Lifestyle   . Physical activity     Days per week: Not on file     Minutes per session: Not on file   . Stress: Not on file   Relationships   . Social Product manager on phone: Not on file     Gets together: Not on file     Attends religious service: Not on file     Active member of club or organization: Not on file     Attends meetings of clubs or organizations: Not on file     Relationship status: Not on file   . Intimate partner violence     Fear of current or ex partner: Not on file     Emotionally abused: Not on file     Physically abused: Not on file     Forced sexual activity: Not on file   Other Topics Concern   . Not on file   Social History Narrative   . Not on file     Family History   Problem Relation Age of Onset   . Cancer Mother         lung    . Cancer Father         bladder   . Heart Disease Father    . High Blood Pressure Father    . Cancer Maternal Aunt         ovarian   . Cancer Paternal Cousin  ovarian       Vitals:    06/24/19 0846   BP: 138/78   Temp: 96.8 F (36 C)   Weight: 185 lb (83.9 kg)   Height: _0  (1.549 m)     Physical Exam  Constitutional:  Well developed, well nourished, no acute distress, non-toxic appearance   Musculoskeletal:    RIGHT  Swell  Tender  ROM  LEFT  Swell  Tender  ROM    DIP2  0  0   [place]  0  0   [place]den   DIP3  0  0   [place]  0  0   [place]   DIP4  0  0   [place]  0  0   [place]   DIP5  0  0   [place]  0  0   [place]   PIP1  0  0   bony change  0  0   bony change   PIP2  0  0  FULL   0  0  FULL    PIP3  0  0  FULL   0  0  FULL    PIP4  0  0  FULL   0  0  FULL    PIP5  0  0  FULL   0  0  FULL    MCP1  0  0   bony change  0  0   bony change   MCP2  0  0   bony change  0  0   bony change   MCP3  0  0   bony change  0  0   bony change+++   MCP4  0  0  FULL   0  0  FULL    MCP5  0  0  FULL   0  0  FULL    Wrist  0  0  FULL   0  0  FULL    Elbow  0  0  FULL   0  0  FULL    Shouldr  0  0   FULL   0  0  FULL    Hip  0  0  FULL   0  0  FULL    Knee  0  0  CREPITUS/VARUS   0  0  TKR   Ankle  0  0  FULL   0  0  FULL    MTP1  0  + FULL   0  + FULL    MTP2  0  + FULL   0  +  FULL    MTP3  0  + FULL   0  +  FULL    MTP4  0  + FULL   0  +  FULL    MTP5  0  + FULL   0  +  FULL    IP1  0  0  FULL   0  0  FULL    IP2  0  0  FULL   0  0  FULL    IP3  0  0  FULL   0  0  FULL    IP4  0  0  FULL   0  0  FULL    IP5  0  0  FULL   0 0 FULL     Squaring and bony enlargement of bilateral CMC joints  Trace soft tissue swelling in bilateral PIP joints  Ambulates without assistance, normal gait  Neck: Full ROM, no tenderness,supple   Back-  Tenderness++  Eyes:  PERRL, extra ocular movements intact, conjunctiva normal   HEENT:  Atraumatic, normocephalic, external ears normal, oropharynx moist, no pharyngeal exudates.   Respiratory:  No respiratory distress  GI:  Soft, nondistended, normal bowel sounds, nontender, noorganomegaly, no mass, no rebound, no guarding   GU:  No costovertebral angle tenderness   Integument:  Well hydrated, no rash or telangiectasias  Lymphatic:  No lymphadenopathy noted   Neurologic:   Alert & oriented x 3, CN 2-12 normal, no focal deficits noted. Sensations Intact. Muscle strength 5/5 proximallyand distally in upper and lower extremities.   Psychiatric:  Speech and behavior appropriate           LABS AND IMAGING  Outside data reviewed and in HPI    Lab Results   Component Value Date    WBC 5.3 11/12/2018    RBC 3.87 11/12/2018    RBC 4.72 10/26/2015    HGB 11.3 11/12/2018    HCT 34.7 11/12/2018    PLT 214 11/12/2018    MCV 89.6 11/12/2018    MCH 29.3 11/12/2018    MCHC 32.7 11/12/2018    RDW 14.5 11/12/2018    SEGSPCT 60.4 02/02/2010    LYMPHOPCT 32.6 11/12/2018    LYMPHOPCT 24.7 10/26/2015    MONOPCT 6.6 11/12/2018    EOSPCT 4.3 02/02/2010    BASOPCT 0.5 11/12/2018    MONOSABS 0.4 11/12/2018    LYMPHSABS 1.7 11/12/2018    EOSABS 0.1 11/12/2018    BASOSABS 0.0 11/12/2018    DIFFTYPE Auto  02/02/2010       Chemistry        Component Value Date/Time    NA 141 12/18/2018 1144    K 4.1 12/18/2018 1144    CL 103 12/18/2018 1144    CO2 25 12/18/2018 1144    BUN 16 12/18/2018 1144    CREATININE 1.2 12/18/2018 1144        Component Value Date/Time    CALCIUM 9.5 12/18/2018 1144    ALKPHOS 78 11/12/2018 1014    AST 18 11/12/2018 1014    ALT 12 11/12/2018 1014    BILITOT 0.4 11/12/2018 1014          Lab Results   Component Value Date    SEDRATE 16 10/24/2018     Lab Results   Component Value Date    CRP 1.6 11/12/2018     Lab Results   Component Value Date    ANA Negative 11/12/2018     Lab Results   Component Value Date    RF <10.0 10/24/2018     Lab Results   Component Value Date    ANA Negative 11/12/2018     No results found for: DSDNAG, DSDNAIGGIFA  No results found for: SSAROAB, SSALAAB  No results found for: SMAB, RNPAB  No results found for: CENTABIGG  No results found for: C3, C4, ACE  Lab Results   Component Value Date    VITD25 36.9 11/12/2018     No results found for: [employer]ayer [employer]m  Lab Results   Component Value Date    VITAMINB12 1860 (H) 06/19/2019     Lab Results   Component Value Date    TSH 2.71 10/24/2018     Lab Results   Component Value Date    VITD25 36.9 11/12/2018  Hand and foot x-rays 11/11/2018  FINDINGS:    Right hand:        Osseous alignment is normal.        Severe degenerative changes of the 1st [facility] joint. Mild degenerative changes    of the DIP joints and 1st IP joint. Chondrocalcinosis of the TFCC.        No marginal erosions are identified. No acute fracture or gross dislocation    is seen.        No focal soft tissue swelling is evident.        Left hand:        Osseous alignment is normal.        Mild degenerative changes of the DIP joints, 1st MCP joint and triscaphe    joint. Mild to moderate degenerative change of the 1st [facility] joint.    Chondrocalcinosis of the TFCC.        No marginal erosions are identified. No acute fracture or gross dislocation     is seen.        No focal soft tissue swelling is evident.        Right foot:        Osseous alignment is normal. Mild to moderate degenerative changes of the    TMT joints primarily at the 1st TMT joint.        Moderate plantar calcaneal spur. Soft tissue swelling at the right forefoot.    Calcification along the plantar fascia. No marginal erosions are identified.    No acute fracture or gross dislocation is seen.        The medial and middle cuneiforms demonstrate proper alignment with the base    of the 1st and 2nd metatarsals respectively.        No tibiotalar joint effusion is seen.        Boehler's angle is maintained.        Left foot:        Osseous alignment is normal.        Moderate degenerative changes of the midfoot and tarsal metatarsal joints.    Remote healed fracture deformities of the 2nd 3rd metatarsal diaphyses. Mild    plantar calcaneal spur. Calcification of the plantar fascia. Os trigonum    variant. Soft tissue swelling of the left forefoot. No marginal erosions    are identified. No acute fracture or gross dislocation is seen.        The medial and middle cuneiforms demonstrate proper alignment with the base    of the 1st and 2nd metatarsals respectively.        No tibiotalar joint effusion is seen.        Boehler's angle is maintained.            Impression    Right hand:        1. Osteoarthrosis which is severe at the 1st [facility] joint. CPPD.    2. No acute fracture or dislocation.    Left hand:        1. Mild to moderate osteoarthrosis. CPPD.    2. No acute fracture or dislocation.    Right foot:        1. Mild-to-moderate degenerative changes as detailed above. Moderate plantar    calcaneal spur.    2. Soft tissue swelling of the right forefoot.    3. No acute fracture or dislocation.    Left foot:        1. Moderate diffuse degenerative changes  as detailed above.    2. Remote healed fracture deformities of the 2nd and 3rd metatarsal diaphyses.    3. Mild  plantar calcaneal spur.    4. Soft tissue swelling of the forefoot.    5. No acute fracture or dislocation.        MRI left hand 11/15/2018  1. Mild volar subluxation of the 3rd metacarpophalangeal joint with mild    degenerative changes and a small effusion.    2. Severe 1st carpometacarpal degenerative changes. Moderate 1st    interphalangeal degenerative changes       ASSESSMENT AND PLAN      Assessment/Plan:      ASSESSMENT:    1. Inflammatory osteoarthritis    2. H/O calcium pyrophosphate deposition disease (CPPD)    3. Peripheral polyneuropathy        PLAN:   1.  Inflammatory osteoarthritis  Osteoarthritis/ Joint pain : Discussed about diagnosis and management of osteoarthritis. There are no FDA approved disease modifying agents. Goal is to control pain and increase mobility. Discussed the importance of wt loss, exercises, formal PT, joint braces/splints. First line of agent Tylenol arthritis, NSAIDs systemic and/ or topical,Cymbalta, pain meds, joint injections- steroids, and visco supplementaiton for knee OA. Also discussed about surgical options.  Continues to be symptomatic.  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  Hand and foot x-rays with moderate to severe degenerative arthritis with CPPD  Unable to take oral NSAIDs due to CKD  Advised to take Tylenol 1000 mg 3 times a day, do not exceed 3 g daily  Continue diclofenac gel  We will start Plaquenil 200 mg daily  Start prednisone taper      2.  Peripheral neuropathy  Blood work showed vitamin B1 and vitamin B6 deficiency.  She was placed on replacement.  Magnesium, B12, TSH, copper SPEP, hemoglobin A1c normal.    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  Sees neurologist  Will start Cymbalta 30 mg daily due to persistent symptoms  Continue gabapentin 300 mg at bedtime, unable to increase dose due to drowsiness    3. H/O calcium pyrophosphate deposition disease (CPPD)  TSH, magnesium normal.  We  will do work-up to rule out secondary causes.  No flareups of pseudogout.  - Iron Binding Capacity; Future  - Iron; Future  - Ferritin; Future  - PTH, Intact; Future  - Phosphorus; Future    Orders Placed This Encounter:  - Comprehensive Metabolic Panel; Future  - C-Reactive Protein; Future  - Sedimentation Rate; Future  - hydroxychloroquine (PLAQUENIL) 200 MG tablet; Take 1 tablet by mouth daily  Dispense: 30 tablet; Refill: 5  - predniSONE (DELTASONE) 5 MG tablet; 2 tabs for 10 days,1.5 tab for 10 days,1 tab for 10 days, 0.5 tab for 10 days and stop  Dispense: 50 tablet; Refill: 0  - Phosphorus  - PTH, Intact  - Ferritin  - Iron  - Iron Binding Capacity  - DULoxetine (CYMBALTA) 30 MG extended release capsule; Take 1 capsule by mouth daily  Dispense: 30 capsule; Refill: 3      Plaquenil can cause skin rash, GI issues, visual blurriness and increases the risk of retinal toxicity; yearly eye/retinal exam was advised while taking plaquenil.       The patient indicates understanding of these issues and agrees with the plan.      Return in about 3 months (around 09/21/2019).      The risks and benefits of my  recommendations, as well as other treatment options, benefits and side effects werediscussed with the patient. All questions were answered.       ######################################################################    I thank you for giving me theopportunity to participate in [name] care. If you have any questions or concerns please feel free to contact me. I look forward to following  Syanna along with you.      Electronically signed by: Oletta Lamas, MD, 06/24/2019 9:14 AM    Documentation was done using voice recognition dragon software.  Every effort was made to ensure accuracy;however, inadvertent unintentional computerized transcription errors may be present.

## 2019-06-25 LAB — COMPREHENSIVE METABOLIC PANEL
ALT: 17 U/L (ref 10–40)
AST: 25 U/L (ref 15–37)
Albumin/Globulin Ratio: 1.5 (ref 1.1–2.2)
Albumin: 4.1 g/dL (ref 3.4–5.0)
Alkaline Phosphatase: 108 U/L (ref 40–129)
Anion Gap: 13 (ref 3–16)
BUN: 23 mg/dL — ABNORMAL HIGH (ref 7–20)
CO2: 28 mmol/L (ref 21–32)
Calcium: 9.9 mg/dL (ref 8.3–10.6)
Chloride: 104 mmol/L (ref 99–110)
Creatinine: 1.5 mg/dL — ABNORMAL HIGH (ref 0.6–1.2)
GFR African American: 40 — AB (ref >60)
GFR Non-African American: 33 — AB (ref >60)
Globulin: 2.8 g/dL
Glucose: 109 mg/dL — ABNORMAL HIGH (ref 70–99)
Potassium: 3.9 mmol/L (ref 3.5–5.1)
Sodium: 145 mmol/L (ref 136–145)
Total Bilirubin: 0.5 mg/dL (ref 0.0–1.0)
Total Protein: 6.9 g/dL (ref 6.4–8.2)

## 2019-06-25 LAB — IRON: Iron: 67 ug/dL (ref 37–145)

## 2019-06-25 LAB — SEDIMENTATION RATE: Sed Rate: 24 mm/Hr (ref 0–30)

## 2019-06-25 LAB — C-REACTIVE PROTEIN: CRP: <3 mg/L (ref 0.0–5.1)

## 2019-06-25 LAB — VITAMIN B6: Vitamin B6, Plasma: 195.3 nmol/L — ABNORMAL HIGH (ref 20.0–125.0)

## 2019-06-25 LAB — PHOSPHORUS: Phosphorus: 3.7 mg/dL (ref 2.5–4.9)

## 2019-06-25 LAB — PTH, INTACT: PTH: 40.8 pg/mL (ref 14.0–72.0)

## 2019-06-25 LAB — IRON BINDING CAPACITY: TIBC: 316 ug/dL (ref 260–445)

## 2019-06-25 LAB — FERRITIN: Ferritin: 45.8 ng/mL (ref 15.0–150.0)

## 2019-06-25 NOTE — Progress Notes (Signed)
Called pt to discuss lab results re: vitamin levels prescribed by her rheumatologist. Reached voicemail. Left message advising results as stated per Dr Sunny Schlein.

## 2019-06-25 NOTE — Other (Signed)
Call the patient and let her know that her kidney function is slightly off.  Should avoid over-the-counter ibuprofen/Aleve.  Continue other meds

## 2019-06-25 NOTE — Telephone Encounter (Signed)
Jodi Walls phone number is busy, will try call again later.

## 2019-06-25 NOTE — Telephone Encounter (Signed)
-----   Message from Richardo Hanks, MD sent at 06/25/2019  9:15 AM EST -----  Please call patient.  The patient's vitamin B1 and B6 levels are now on the high side.  She is on vitamin replacements prescribed by her rheumatologist.  Please ask patient to check with her rheumatologist if she should continue with vitamin replacements or decrease the dose slightly.  Keep follow-up appointment with me in April

## 2019-07-10 NOTE — Telephone Encounter (Signed)
Called pt. She is aware Dr.Ali is out of the office Friday afternoon. We will have her address this message on Monday.

## 2019-07-10 NOTE — Telephone Encounter (Signed)
Patient states Dr Sunny Schlein advised her that her B1 & B6 are high.  He asked her to call Dr Deatra Canter to see if she should continue those vitamins or take a lower dose.

## 2019-07-10 NOTE — Telephone Encounter (Signed)
Please call and let her know to hold vit b1 and vit b6

## 2019-07-13 NOTE — Telephone Encounter (Signed)
The patient has been notified of this information and all questions answered.  She will hold the vitamin B6 & B1. Per Dr Deatra Canter

## 2019-07-16 ENCOUNTER — Encounter

## 2019-07-17 MED ORDER — GABAPENTIN 300 MG PO CAPS
300 MG | ORAL_CAPSULE | Freq: Every day | ORAL | 1 refills | Status: DC
Start: 2019-07-17 — End: 2019-09-22

## 2019-07-17 MED ORDER — LEVOTHYROXINE SODIUM 75 MCG PO TABS
75 MCG | ORAL_TABLET | ORAL | 1 refills | Status: DC
Start: 2019-07-17 — End: 2020-01-14

## 2019-08-17 ENCOUNTER — Ambulatory Visit
Admit: 2019-08-17 | Discharge: 2019-08-17 | Payer: MEDICARE | Attending: Clinical Neurophysiology | Primary: Family Medicine

## 2019-08-17 DIAGNOSIS — G63 Polyneuropathy in diseases classified elsewhere: Secondary | ICD-10-CM

## 2019-08-17 MED ORDER — DULOXETINE HCL 60 MG PO CPEP
60 MG | ORAL_CAPSULE | Freq: Every day | ORAL | 2 refills | Status: DC
Start: 2019-08-17 — End: 2020-04-04

## 2019-08-17 NOTE — Progress Notes (Signed)
Jodi Walls   Neurology followup    Subjective:   CC/HP  History was obtained from the patient.  Patient states that she still continues to have tingling numbness and pain in her feet.  She is having some symptoms in her hands as well.  She is currently on gabapentin 300 mg at night as well as duloxetine 30 mg daily.  Detailed history:  Patient stated the symptoms started approximately 3 years ago.  Onset was gradual and the symptoms are getting worse.  Patient complains of tingling and numbness in her hands and feet.  She is also feeling weaker.  She has leg cramps.  She was seen by rheumatologist who did a detailed work-up.  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  Her vitamin B12 level was borderline.  Patient is now on replacement vitamin therapy.  Patient has not noticed any significant improvement.  Patient has never had any gastric bypass surgery or any similar bowel surgery.  Comorbidities include hypothyroidism, breast cancer back in 89 without any chemotherapy and arthritis    REVIEW OF SYSTEMS    Constitutional:  []    Chills   [x]   Fatigue   []   Fevers   []   Malaise   []   Weight loss     []  Denies all of the above    Respiratory:   []   Cough    [x]   Shortness of breath         []  Denies all of the above     Cardiovascular:   []   Chest pain    []   Exertional chest pressure/discomfort           []  Palpitations    []   Syncope     [x]  Denies all of the above        Past Medical History:   Diagnosis Date   . Anemia    . Arthritis    . Cancer ([place]) 1995    hx of breast cancer--no chemo or radiation needed   . GERD (gastroesophageal reflux disease)    . Hx of blood clots 2001    after gallbladder surgery--blood clot in arm where IV had been   . Hypothyroidism    . Kidney failure     stage 3   . PONV (postoperative nausea and vomiting)    . Wears glasses     reading     Family History   Problem Relation Age of Onset   . Cancer Mother         lung    . Cancer Father         bladder   . Heart  Disease Father    . High Blood Pressure Father    . Cancer Maternal Aunt         ovarian   . Cancer Paternal Cousin         ovarian     Social History     Socioeconomic History   . Marital status: Widowed     Spouse name: None   . Number of children: None   . Years of education: None   . Highest education level: None   Occupational History   . None   Social Needs   . Financial resource strain: None   . Food insecurity     Worry: None     Inability: None   . Transportation needs     Medical: None     Non-medical: None  Tobacco Use   . Smoking status: Never Smoker   . Smokeless tobacco: Never Used   Substance and Sexual Activity   . Alcohol use: Yes     Comment: social-rare. once a year   . Drug use: No   . Sexual activity: Not Currently   Lifestyle   . Physical activity     Days per week: None     Minutes per session: None   . Stress: None   Relationships   . Social Product manager on phone: None     Gets together: None     Attends religious service: None     Active member of club or organization: None     Attends meetings of clubs or organizations: None     Relationship status: None   . Intimate partner violence     Fear of current or ex partner: None     Emotionally abused: None     Physically abused: None     Forced sexual activity: None   Other Topics Concern   . None   Social History Narrative   . None        Objective:  Exam:  BP (!) 142/90   Pulse 85   Ht 5\' 1"  (1.549 m)   Wt 183 lb (83 kg)   BMI 34.58 kg/m   This is a well-nourished patient in no acute distress  Patient is awake, alert and oriented x3. Speech is normal.  Pupils are equal round reacting to light. Extraocular movements intact. Face symmetrical. Tongue midline.  Motor exam shows normal symmetrical strength. Deep tendon reflexes normal. Plantar reflexes downgoing.  Sensory exam shows decreased light touch in the distal lower extremities bilaterally. Coordination normal. Gait normal. No carotid bruit. No neck stiffness.      Data  :  LABS:  General Labs:    CBC:   Lab Results   Component Value Date    WBC 5.3 11/12/2018    RBC 3.87 11/12/2018    RBC 4.72 10/26/2015    HGB 11.3 11/12/2018    HCT 34.7 11/12/2018    MCV 89.6 11/12/2018    MCH 29.3 11/12/2018    MCHC 32.7 11/12/2018    RDW 14.5 11/12/2018    PLT 214 11/12/2018    MPV 10.0 11/12/2018     BMP:    Lab Results   Component Value Date    NA 145 06/24/2019    K 3.9 06/24/2019    CL 104 06/24/2019    CO2 28 06/24/2019    BUN 23 06/24/2019    LABALBU 4.1 06/24/2019    CREATININE 1.5 06/24/2019    CALCIUM 9.9 06/24/2019    GFRAA 40 06/24/2019    GFRAA 48 10/11/2011    LABGLOM 33 06/24/2019    LABGLOM 47.2 08/15/2009    GLUCOSE 109 06/24/2019    GLUCOSE 109 10/26/2015       Impression :  Neuropathic pain symptoms still very bothersome.    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  Her vitamin B12 level was also borderline low  Patient was started on vitamin replacements and her repeat B1 and B6 levels were high.  So she has stopped taking the B1 and B6.  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 Although every effort was made to ensure the accuracy of this automated transcription, some errors in transcription may have occurred.

## 2019-09-22 ENCOUNTER — Encounter

## 2019-09-22 ENCOUNTER — Ambulatory Visit: Admit: 2019-09-22 | Discharge: 2019-09-22 | Payer: MEDICARE | Attending: Internal Medicine | Primary: Family Medicine

## 2019-09-22 DIAGNOSIS — M1990 Unspecified osteoarthritis, unspecified site: Secondary | ICD-10-CM

## 2019-09-22 MED ORDER — GABAPENTIN 300 MG PO CAPS
300 MG | ORAL_CAPSULE | Freq: Every day | ORAL | 1 refills | Status: DC
Start: 2019-09-22 — End: 2020-03-01

## 2019-09-22 MED ORDER — LEFLUNOMIDE 20 MG PO TABS
20 | ORAL_TABLET | Freq: Every day | ORAL | 3 refills | Status: DC
Start: 2019-09-22 — End: 2020-01-14

## 2019-09-22 MED ORDER — PREDNISONE 5 MG PO TABS
5 MG | ORAL_TABLET | Freq: Every day | ORAL | 2 refills | Status: DC
Start: 2019-09-22 — End: 2020-01-05

## 2019-09-22 NOTE — Patient Instructions (Signed)
Start prednisone 5 mg daily   Gabapentin 600 mg at bedtime   Start arava 20 mg daily   Labs in 4 weeks   Continue plaquenil, diclofenac gel

## 2019-09-22 NOTE — Progress Notes (Signed)
09/22/2019     Patient Name: Jodi Walls  DOB: Jul 29, 1940  MEDICAL RECORD [id number]    MEDICATIONS  Current Outpatient Medications   Medication Sig Dispense Refill   . leflunomide (ARAVA) 20 MG tablet Take 1 tablet by mouth daily 30 tablet 3   . predniSONE (DELTASONE) 5 MG tablet Take 1 tablet by mouth daily 30 tablet 2   . gabapentin (NEURONTIN) 300 MG capsule Take 2 capsules by mouth Daily with supper for 180 days. 180 capsule 1   . DULoxetine (CYMBALTA) 60 MG extended release capsule Take 1 capsule by mouth daily 30 capsule 2   . levothyroxine (SYNTHROID) 75 MCG tablet TAKE 1 TABLET BY MOUTH DAILY 90 tablet 1   . hydroxychloroquine (PLAQUENIL) 200 MG tablet Take 1 tablet by mouth daily 30 tablet 5   . vitamin D (CHOLECALCIFEROL) 25 MCG (1000 UT) TABS tablet Take 1 tablet by mouth daily     . potassium chloride (KLOR-CON) 10 MEQ extended release tablet Take 10 mEq by mouth 2 times daily     . diclofenac sodium (VOLTAREN) 1 % GEL Apply 4 grams to lower extremity joints and 2 grams to upper extremity joints as needed 4 times/day 500 g 5   . melatonin 3 MG TABS tablet Take 3 mg by mouth nightly      . aspirin 81 MG tablet Take 81 mg by mouth daily.       No current facility-administered medications for this visit.       ALLERGIES  Allergies   Allergen Reactions   . Pcn [Penicillins] Swelling     Face and hands swelled   . Triamterene      Severe stomach pain,SOB,severe cramps throughout her body   . Ace Inhibitors Other (See Comments)     cough   . Atorvastatin Other (See Comments)     Muscle aches   . Zithromax [Azithromycin] Swelling   . Codeine Nausea And Vomiting         Comments  No specialty comments available.    Background history:  Jodi Walls is a 79 y.o. female breast cancer status post lumpectomy, hypertension, osteoarthritis, hypothyroidism, CKD 3 who is being seen for follow up evaluation of  joint pain.  She is complaining of pain in her hands and feet.  We started 2 years ago.  Symptoms have  progressively worsened over the past 1 year.  She has pain on daily basis, 10 out of 10.  She is swelling in her knuckles and feet.  She has morning stiffness lasting for 1 hour.  She has difficulty opening doorknobs and jars.  She has developed weakness in her hands and is dropping things.  She takes Tylenol 1000 mg twice a day with minimal relief.  She also takes Aleve as needed.  She is unable to take oral NSAIDs due to CKD 3.  She has a history of left knee replacement.  She has osteoarthritis in the right knee as well.  Right knee cracks and pops.  Pain in the right knee gets worse going up and down the steps and standing from the sitting position.  She had a trauma to the left hand in October 2019.  Since then left third MCP joint stays swollen and painful.  She had x-ray of the left hand which did not show any fracture.  She denies psoriasis, inflammatory bowel disease, inflammatory back pain, dactylitis, enthesitis, tenosynovitis or uveitis.  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  Her mother had arthritis  She had work-up with PCP which showed negative RF, ESR and normal TSH.    Interim history: She presents for follow-up of osteoarthritis, CPPD.  She takes Tylenol 1000 mg twice a day and diclofenac gel with minimal relief.  She was placed on Plaquenil 200 mg daily 3 months ago which has not helped either.  She continues to complain of pain, swelling and stiffness in the joints.  Symptoms are worse in her hands and feet.  She had left third MCP joint arthroplasty.  She also saw a neurologist for peripheral neuropathy.  Additional work-up was ordered which is in process.  She takes gabapentin 300 mg at bedtime and Cymbalta 60 mg but she still has tingling and numbness in her hands and feet.  She has vitamin B1 and vitamin B6 deficiency.  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  Work-up came back negative for  rheumatoid arthritis.  Hand and foot x-rays showed moderate to severe degenerative changes with CPPD.  She denies any pseudogout flareups.  Work-up negative for secondary causes of pseudogout.  She denies any side effects with Plaquenil.  She denies any recent fevers or infections.Marland Kitchen  HPI  Review of Systems    REVIEW OF SYSTEMS:   Constitutional: No unanticipated weight loss or fevers.   Integumentary: No rash, photosensitivity, malar rash, livedo reticularis, alopecia and Raynaud's symptoms, sclerodactyly, skin tightening  Eyes: negative for visual disturbance and persistent redness, discharge from eyes   ENT: - No tinnitus, loss of hearing, vertigo, or recurrent ear infections.  - No history of nasal/oral ulcers.  - No history of dry eyes/dry mouth  Cardiovascular: No history of pericarditis, chest pain or murmur or palpitations  Respiratory: No shortness of breath, cough or history of interstitial lung disease.No history of pleurisy. No history of tuberculosis or atypical infections.  Gastrointestinal: No history of heart burn, dysphagia or esophageal dysmotility. No change in bowel habits or any inflammatory bowel disease.  Genitourinary: No history miscarriages.  Hematologic/Lymphatic: No abnormal bruising or bleeding, blood clots or swollen lymph nodes.  Neurological: No history of headaches, seizure or focal weakness. No history of neuropathies, paresthesias or hyperesthesias, facial droop, diplopia  Psychiatric: No history of bipolar disease, anxiety, depression  Endocrine: Denies any polyuria, polydipsia and osteoporosis  Allergic/Immunologic: No nasal congestion or hives.        I have reviewed patients Past medical History, Social History and Family History as mentioned in her chart and this remains unchanged fromprevious.    Past Medical History:   Diagnosis Date   . Anemia    . Arthritis    . Cancer ([place]) 1995    hx of breast cancer--no chemo or radiation needed   . GERD (gastroesophageal reflux disease)     . Hx of blood clots 2001    after gallbladder surgery--blood clot in arm where IV had been   . Hypothyroidism    . Kidney failure     stage 3   . PONV (postoperative nausea and vomiting)    . Wears glasses     reading     Past Surgical History:   Procedure Laterality Date   . APPENDECTOMY     . ARTHROPLASTY Left 12/25/2018    LEFT MIDDLE FINGER METACARPO-PHALANGEAL JOINT SYNOVECTOMY, LIGAMENT REBALANCING, EXTENSOR TENDON CENTRALIZATION AND SILICONE METACARPOPHALANGEAL JOINT ARTHROPLASTY performed by Delene Loll, MD at [place]   . BACK SURGERY  r/t to herniated disc in lower back, neck area--screws and rods in place   . BREAST BIOPSY      left breast biopsy   . BREAST SURGERY  1995    right mastectomy   . CHOLECYSTECTOMY  2001   . COLONOSCOPY  04/12/2015    Esophagogastroduodenoscopy with biopsy, Colonoscopy, diagnostic. no findings except for diverticula and check in 10 years   . HYSTERECTOMY      TAH-BSO. no  cancer   . JOINT REPLACEMENT Left     left total knee replacement   . LIPOMA RESECTION      right side below right breast   . MASTECTOMY, RADICAL  1995   . UPPER GASTROINTESTINAL ENDOSCOPY  04/12/2015    and colonoscopy. hiatal hernia and gastritis     Social History     Socioeconomic History   . Marital status: Widowed     Spouse name: Not on file   . Number of children: Not on file   . Years of education: Not on file   . Highest education level: Not on file   Occupational History   . Not on file   Tobacco Use   . Smoking status: Never Smoker   . Smokeless tobacco: Never Used   Vaping Use   . Vaping Use: Never used   Substance and Sexual Activity   . Alcohol use: Yes     Comment: social-rare. once a year   . Drug use: No   . Sexual activity: Not Currently   Other Topics Concern   . Not on file   Social History Narrative   . Not on file     Social Determinants of Health     Financial Resource Strain:    . Difficulty of Paying Living Expenses:    Food Insecurity:    . Worried About Charity fundraiser  in the Last Year:    . Arboriculturist in the Last Year:    Transportation Needs:    . Film/video editor (Medical):    Marland Kitchen Lack of Transportation (Non-Medical):    Physical Activity:    . Days of Exercise per Week:    . Minutes of Exercise per Session:    Stress:    . Feeling of Stress :    Social Connections:    . Frequency of Communication with Friends and Family:    . Frequency of Social Gatherings with Friends and Family:    . Attends Religious Services:    . Active Member of Clubs or Organizations:    . Attends Archivist Meetings:    Marland Kitchen Marital Status:    Intimate Partner Violence:    . Fear of Current or Ex-Partner:    . Emotionally Abused:    Marland Kitchen Physically Abused:    . Sexually Abused:      Family History   Problem Relation Age of Onset   . Cancer Mother         lung    . Cancer Father         bladder   . Heart Disease Father    . High Blood Pressure Father    . Cancer Maternal Aunt         ovarian   . Cancer Paternal Cousin         ovarian       Vitals:    09/22/19 1056   BP: 130/70   Pulse: 70   Weight: 188  lb 3.2 oz (85.4 kg)   Height: '5\' 1"'$  (1.549 m)     Physical Exam  Constitutional:  Well developed, well nourished, no acute distress, non-toxic appearance   Musculoskeletal:    RIGHT  Swell  Tender  ROM  LEFT  Swell  Tender  ROM    DIP2  0  0   [place]  0  0   [place]   DIP3  0  0   [place]  0  0   [place]   DIP4  0  0   [place]  0  0   [place]   DIP5  0  0   [place]  0  0   [place]   PIP1  0  0   bony change  0  0   bony change   PIP2  0  0  FULL   0  0  FULL    PIP3  0  0  FULL   0  0  FULL    PIP4  0  0  FULL   0  0  FULL    PIP5  0  0  FULL   0  0  FULL    MCP1  0  0   bony change  0  0   bony change   MCP2  0  0   bony change  0  0   bony change   MCP3  0  0   bony change  0  0   bony change+++   MCP4  0  0  FULL   0  0  FULL    MCP5  0  0  FULL   0  0  FULL    Wrist  0  0  FULL   0  0  FULL    Elbow  0  0  FULL   0  0  FULL    Shouldr  0  0  FULL   0  0  FULL    Hip  0  0  FULL    0  0  FULL    Knee  0  0  CREPITUS/VARUS   0  0  TKR   Ankle  0  0  FULL   0  0  FULL    MTP1  0  + FULL   0  + FULL    MTP2  0  + FULL   0  +  FULL    MTP3  0  + FULL   0  +  FULL    MTP4  0  + FULL   0  +  FULL    MTP5  0  + FULL   0  +  FULL    IP1  0  0  FULL   0  0  FULL    IP2  0  0  FULL   0  0  FULL    IP3  0  0  FULL   0  0  FULL    IP4  0  0  FULL   0  0  FULL    IP5  0  0  FULL   0 0 FULL     Squaring and bony enlargement of bilateral CMC joints  Trace soft tissue swelling in bilateral PIP joints  Ambulates without assistance, normal gait  Neck: Full ROM, no tenderness,supple   Back-  Tenderness++  Eyes:  PERRL, extra ocular movements intact, conjunctiva normal   HEENT:  Atraumatic, normocephalic, external ears normal, oropharynx moist, no pharyngeal exudates.   Respiratory:  No respiratory distress  GI:  Soft, nondistended, normal bowel sounds, nontender, noorganomegaly, no mass, no rebound, no guarding   GU:  No costovertebral angle tenderness   Integument:  Well hydrated, no rash or telangiectasias  Lymphatic:  No lymphadenopathy noted   Neurologic:   Alert & oriented x 3, CN 2-12 normal, no focal deficits noted. Sensations Intact. Muscle strength 5/5 proximallyand distally in upper and lower extremities.   Psychiatric:  Speech and behavior appropriate           LABS AND IMAGING  Outside data reviewed and in HPI    Lab Results   Component Value Date    WBC 5.3 11/12/2018    RBC 3.87 11/12/2018    RBC 4.72 10/26/2015    HGB 11.3 11/12/2018    HCT 34.7 11/12/2018    PLT 214 11/12/2018    MCV 89.6 11/12/2018    MCH 29.3 11/12/2018    MCHC 32.7 11/12/2018    RDW 14.5 11/12/2018    SEGSPCT 60.4 02/02/2010    LYMPHOPCT 32.6 11/12/2018    LYMPHOPCT 24.7 10/26/2015    MONOPCT 6.6 11/12/2018    EOSPCT 4.3 02/02/2010    BASOPCT 0.5 11/12/2018    MONOSABS 0.4 11/12/2018    LYMPHSABS 1.7 11/12/2018    EOSABS 0.1 11/12/2018    BASOSABS 0.0 11/12/2018    DIFFTYPE Auto 02/02/2010       Chemistry        Component  Value Date/Time    NA 145 06/24/2019 0933    K 3.9 06/24/2019 0933    CL 104 06/24/2019 0933    CO2 28 06/24/2019 0933    BUN 23 (H) 06/24/2019 0933    CREATININE 1.5 (H) 06/24/2019 0933        Component Value Date/Time    CALCIUM 9.9 06/24/2019 0933    ALKPHOS 108 06/24/2019 0933    AST 25 06/24/2019 0933    ALT 17 06/24/2019 0933    BILITOT 0.5 06/24/2019 0933          Lab Results   Component Value Date    SEDRATE 24 06/24/2019     Lab Results   Component Value Date    CRP <3.0 06/24/2019     Lab Results   Component Value Date    ANA Negative 11/12/2018     Lab Results   Component Value Date    RF <10.0 10/24/2018     Lab Results   Component Value Date    ANA Negative 11/12/2018     No results found for: DSDNAG, DSDNAIGGIFA  No results found for: SSAROAB, SSALAAB  No results found for: SMAB, RNPAB  No results found for: CENTABIGG  No results found for: C3, C4, ACE  Lab Results   Component Value Date    VITD25 36.9 11/12/2018     No results found for: [employer]  Lab Results   Component Value Date    VITAMINB12 1860 (H) 06/19/2019     Lab Results   Component Value Date    TSH 2.71 10/24/2018     Lab Results   Component Value Date    VITD25 36.9 11/12/2018       Hand and foot x-rays 11/11/2018  FINDINGS:    Right hand:        Osseous alignment is normal.  Severe degenerative changes of the 1st [facility] joint. Mild degenerative changes    of the DIP joints and 1st IP joint. Chondrocalcinosis of the TFCC.        No marginal erosions are identified. No acute fracture or gross dislocation    is seen.        No focal soft tissue swelling is evident.        Left hand:        Osseous alignment is normal.        Mild degenerative changes of the DIP joints, 1st MCP joint and triscaphe    joint. Mild to moderate degenerative change of the 1st [facility] joint.    Chondrocalcinosis of the TFCC.        No marginal erosions are identified. No acute fracture or gross dislocation    is seen.        No focal soft  tissue swelling is evident.        Right foot:        Osseous alignment is normal. Mild to moderate degenerative changes of the    TMT joints primarily at the 1st TMT joint.        Moderate plantar calcaneal spur. Soft tissue swelling at the right forefoot.    Calcification along the plantar fascia. No marginal erosions are identified.    No acute fracture or gross dislocation is seen.        The medial and middle cuneiforms demonstrate proper alignment with the base    of the 1st and 2nd metatarsals respectively.        No tibiotalar joint effusion is seen.        Boehler's angle is maintained.        Left foot:        Osseous alignment is normal.        Moderate degenerative changes of the midfoot and tarsal metatarsal joints.    Remote healed fracture deformities of the 2nd 3rd metatarsal diaphyses. Mild    plantar calcaneal spur. Calcification of the plantar fascia. Os trigonum    variant. Soft tissue swelling of the left forefoot. No marginal erosions    are identified. No acute fracture or gross dislocation is seen.        The medial and middle cuneiforms demonstrate proper alignment with the base    of the 1st and 2nd metatarsals respectively.        No tibiotalar joint effusion is seen.        Boehler's angle is maintained.            Impression    Right hand:        1. Osteoarthrosis which is severe at the 1st [facility] joint. CPPD.    2. No acute fracture or dislocation.    Left hand:        1. Mild to moderate osteoarthrosis. CPPD.    2. No acute fracture or dislocation.    Right foot:        1. Mild-to-moderate degenerative changes as detailed above. Moderate plantar    calcaneal spur.    2. Soft tissue swelling of the right forefoot.    3. No acute fracture or dislocation.    Left foot:        1. Moderate diffuse degenerative changes as detailed above.    2. Remote healed fracture deformities of the 2nd and 3rd metatarsal diaphyses.    3. Mild plantar calcaneal spur.    4.  Soft  tissue swelling of the forefoot.    5. No acute fracture or dislocation.        MRI left hand 11/15/2018  1. Mild volar subluxation of the 3rd metacarpophalangeal joint with mild    degenerative changes and a small effusion.    2. Severe 1st carpometacarpal degenerative changes. Moderate 1st    interphalangeal degenerative changes       ASSESSMENT AND PLAN      Assessment/Plan:      ASSESSMENT:    1. Inflammatory osteoarthritis    2. Pain in both lower extremities    3. H/O calcium pyrophosphate deposition disease (CPPD)    4. Peripheral polyneuropathy    5. Primary osteoarthritis involving multiple joints    6. High risk medication use    7. Encounter for screening for other viral diseases         PLAN:   1.  Inflammatory osteoarthritis  Osteoarthritis/ Joint pain : Discussed about diagnosis and management of osteoarthritis. There are no FDA approved disease modifying agents. Goal is to control pain and increase mobility. Discussed the importance of wt loss, exercises, formal PT, joint braces/splints. First line of agent Tylenol arthritis, NSAIDs systemic and/ or topical,Cymbalta, pain meds, joint injections- steroids, and visco supplementaiton for knee OA. Also discussed about surgical options.  Continues to be symptomatic.  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  Hand and foot x-rays with moderate to severe degenerative arthritis with CPPD  Unable to take oral NSAIDs due to CKD  Advised to take Tylenol 1000 mg 3 times a day, do not exceed 3 g daily  Continue diclofenac gel  Continue Plaquenil 200 mg daily  Start leflunomide 20 mg daily and prednisone 5 mg daily  Refer to podiatry for bone spurs    2.  Peripheral neuropathy  Blood work showed vitamin B1 and vitamin B6 deficiency.  She was placed on replacement.  Magnesium, B12, TSH, copper SPEP, hemoglobin A1c normal.    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  Sees neurologist  Continue Cymbalta 60 mg  daily and increase gabapentin to 600 mg at bedtime.      3. H/O calcium pyrophosphate deposition disease (CPPD)  Work-up negative for secondary causes     Diagnosis Orders   1. Inflammatory osteoarthritis  ALT    AST    Creatinine, Serum    Hepatitis C Antibody    Hepatitis B Surface Antigen    Hepatitis B Core Antibody, IgM    leflunomide (ARAVA) 20 MG tablet    predniSONE (DELTASONE) 5 MG tablet   2. Pain in both lower extremities     3. H/O calcium pyrophosphate deposition disease (CPPD)     4. Peripheral polyneuropathy  gabapentin (NEURONTIN) 300 MG capsule    Amb External Referral To Podiatry   5. Primary osteoarthritis involving multiple joints  Amb External Referral To Podiatry   6. High risk medication use     7. Encounter for screening for other viral diseases   Hepatitis B Surface Antigen     Leflunomide can cause nausea/vomiting, diarrhea, elevated liver enzymes, bone marrow toxicity, rare cases of interstitial lung diseases, birth defects and were explained in detail to the patient. We have to check blood work every month for first 3 months and then every 2-3 months  Recommend once yearly flu vaccine, pneumonia and shingles vaccine    The patient indicates understanding of these issues and agrees with the plan.  Return in about 8 weeks (around 11/17/2019).      The risks and benefits of my recommendations, as well as other treatment options, benefits and side effects werediscussed with the patient. All questions were answered.       ######################################################################    I thank you for giving me theopportunity to participate in [name] care. If you have any questions or concerns please feel free to contact me. I look forward to following  Maykayla along with you.      Electronically signed by: Oletta Lamas, MD, MD, 09/22/2019 11:24 AM    Documentation was done using voice recognition dragon software.  Every effort was made to ensure accuracy;however, inadvertent  unintentional computerized transcription errors may be present.

## 2019-09-23 LAB — CREATININE
Creatinine: 1.4 mg/dL — ABNORMAL HIGH (ref 0.6–1.2)
GFR African American: 44 — AB (ref >60)
GFR Non-African American: 36 — AB (ref >60)

## 2019-09-23 LAB — ALT: ALT: 11 U/L (ref 10–40)

## 2019-09-23 LAB — AST: AST: 20 U/L (ref 15–37)

## 2019-09-23 LAB — HEPATITIS C ANTIBODY: Hep C Ab Interp: NONREACTIVE

## 2019-09-23 LAB — HEPATITIS B SURFACE ANTIGEN: Hep B S Ag Interp: NONREACTIVE

## 2019-09-23 LAB — HEPATITIS B CORE ANTIBODY, IGM: Hep B Core Ab, IgM: NONREACTIVE

## 2019-10-02 IMAGING — CT CT CHEST/ABDOMEN/PELVIS W/WO CONTRAST
2 of 10 series · 8 of 46 positions shown, 14 images · IV contrast (ISOVUE 300)
Comparison: CT scan 08/27/2017
COMPARISON: CT scan 08/27/2017

******** ADDENDUM #1 ********/n 
*****CORRECTED REPORT, TITLE EDITED AND TECHNIQUE ADDED***CT CHEST WITH CONTRAST 
ONLY/ABDOMEN/PELVIS W/WO CONTRAST, 10/02/2019 [DATE]: 
CLINICAL INDICATION: Six-month follow-up for left adrenal nodule. Follow-up 
pulmonary nodule. Chest pain from fall one week ago. Previous hysterectomy. 
The patient's eGFR was calculated to be 69 using the i-STAT device.
TECHNIQUE: The abdomen without AND  chest, abdomen and pelvis were scanned from 
base of neck through the pubic rami with 811cc of Isovue 300 injected 
intravenously on a high resolution low dose CT scanner. Routine MPR and MIP 3D 
renderings were reconstructed on an independent workstation with concurrent 
physician supervision.

[Series 8: coronal · coronal · 0.65mm/px · 2 of 136 slices shown, 3 images]
[im 46/136  soft-tissue]
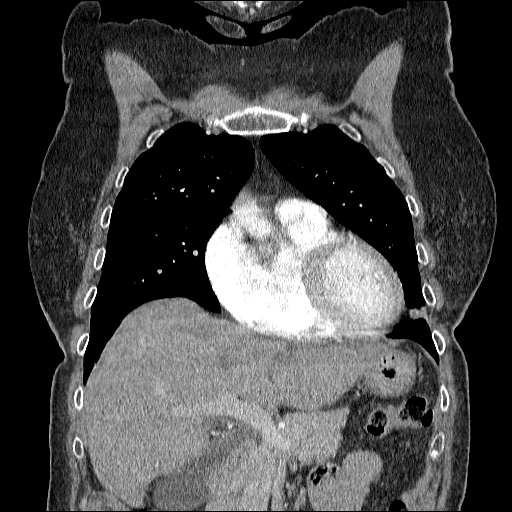
[im 46/136  bone]
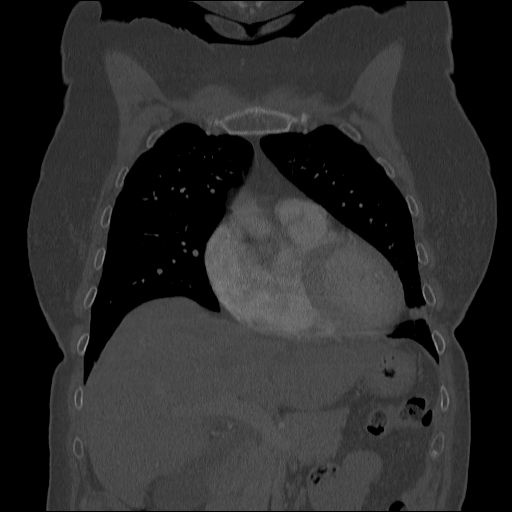
[im 91/136  soft-tissue]
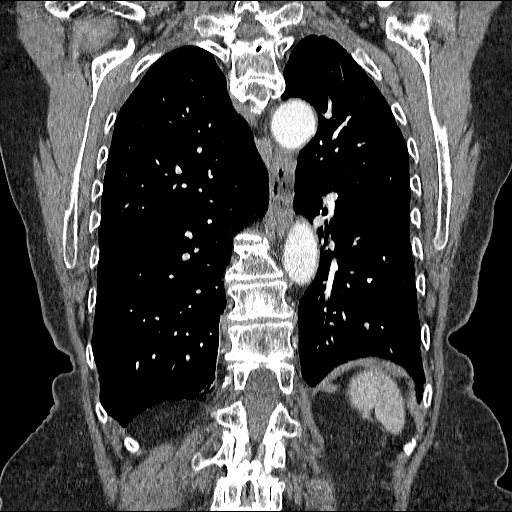

[Series 11: portal with 3.0 i41s 2 · axial · portal-venous · 0.80mm/px · z∈[-563,-242]mm · 6 of 151 slices shown, 11 images]
[im 22/151  soft-tissue]
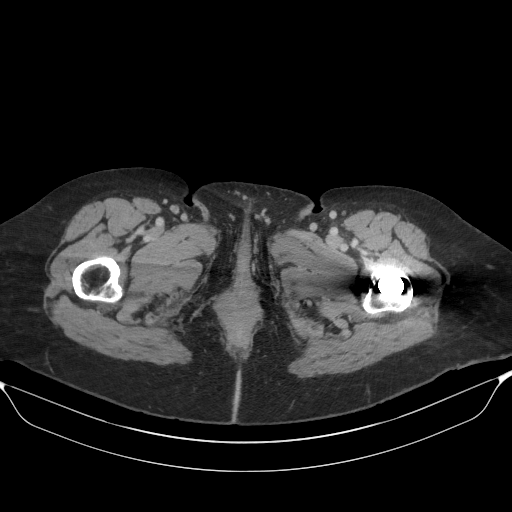
[im 22/151  bone]
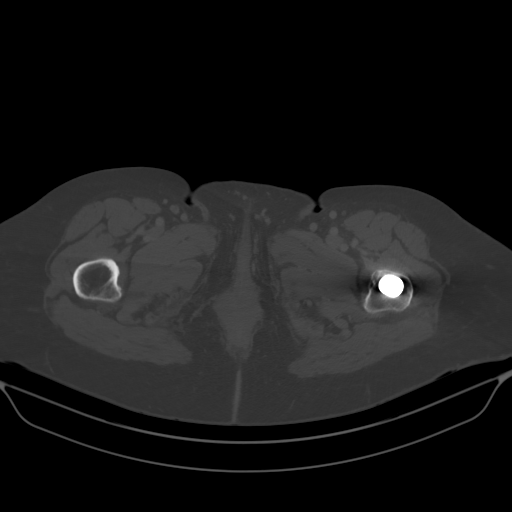
[im 43/151  soft-tissue]
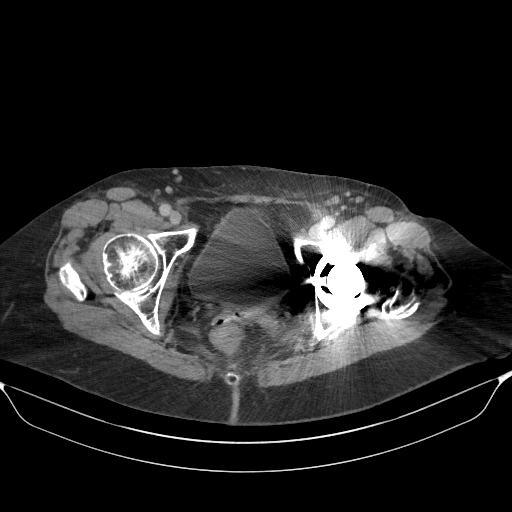
[im 65/151  soft-tissue]
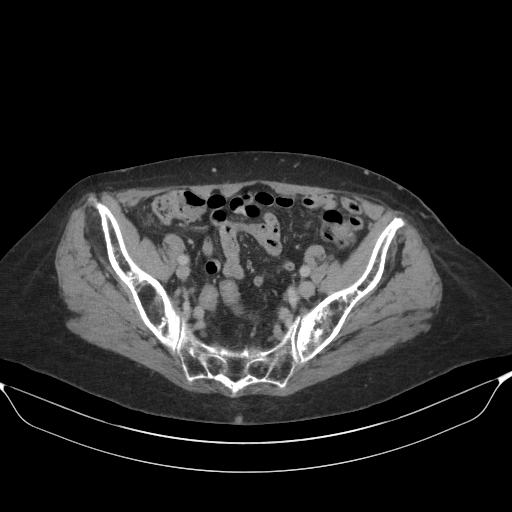
[im 65/151  lung]
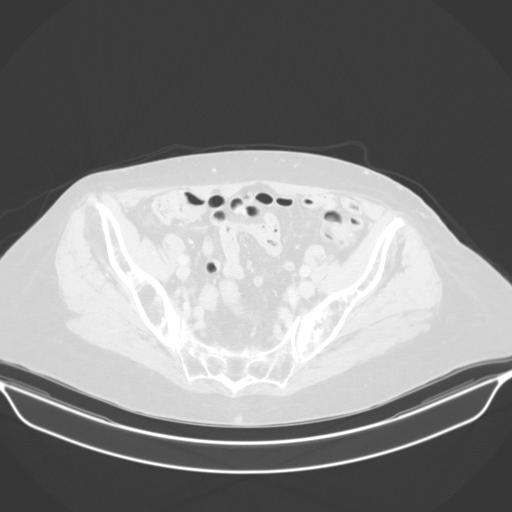
[im 86/151  soft-tissue]
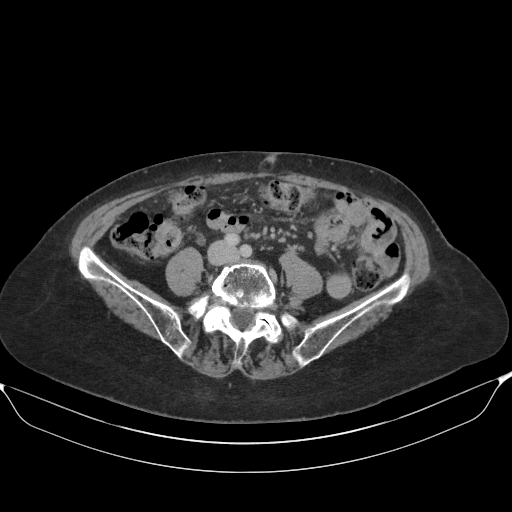
[im 86/151  lung]
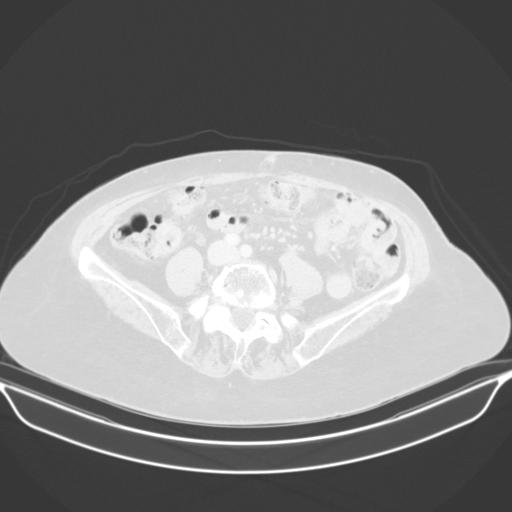
[im 108/151  soft-tissue]
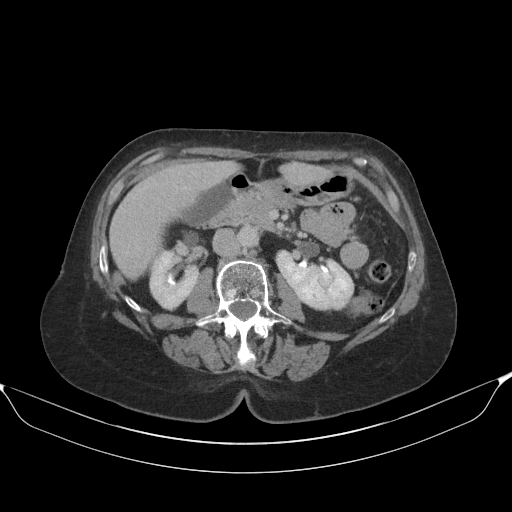
[im 108/151  lung]
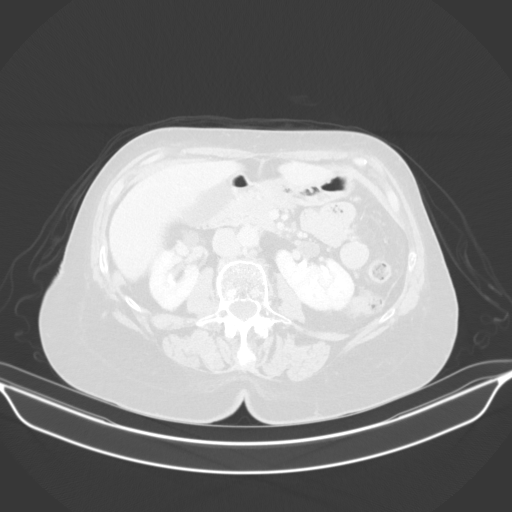
[im 129/151  soft-tissue]
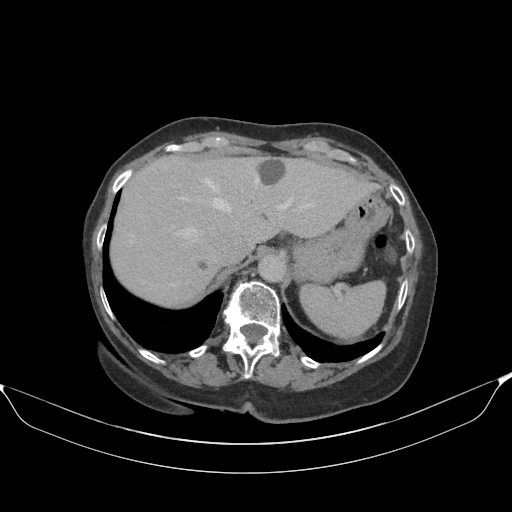
[im 129/151  lung]
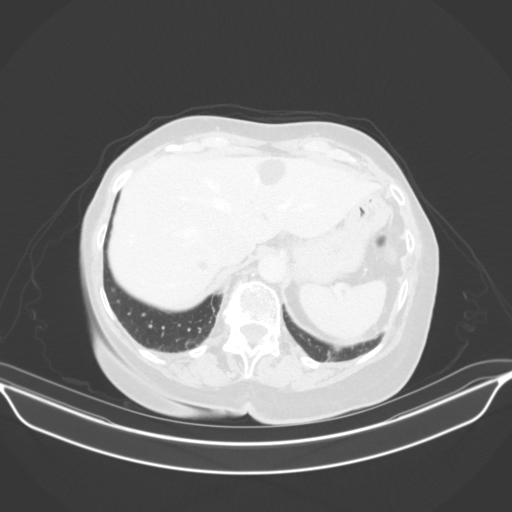

[8 of 46 positions shown; findings below may reference images not displayed]

FINDINGS: Enlarged nodular thyroid gland. Most likely goiter. Patient has had multiple 
prior sonograms as well as previous thyroid biopsy. Emphysematous changes, 
atherosclerotic changes and degenerative changes. No significant coronary 
calcifications. No mediastinal mass or adenopathy. No pleural effusions. Mild 
bibasilar atelectasis. There are few scattered micronodules. None greater than 2 
to 3 mm. There is a groundglass lesion laterally in the right upper lobe on 
image 48 measuring 5 mm in diameter. No suspicious abnormality seen elsewhere on 
the MIP slab reconstructed images. 
Numerous cyst are again scattered throughout the liver. The spleen, pancreas and 
right adrenal gland are normal in appearance. There is an enlarging left adrenal 
nodule. On the noncontrast images average Hounsfield unit of 1.3. This has gone 
from 16 x 11 mm back in 6594 currently 17 x 15 mm thought to be consistent with 
a mildly enlarging lipid rich left adrenal adenoma. Again note is made of 
horseshoe kidney with a thin isthmus connecting the 2 kidneys. No mass or 
obstructive changes. Simple cyst identified. Gallbladder is not distended. Very 
small fat-containing umbilical hernia. Bladder is unremarkable in appearance. 
Postop changes left hip.
IMPRESSION: By history there is a pulmonary nodule. I do not have prior films or reports to 
correlate. There is a 5 mm groundglass focus laterally in the right upper lobe. 
Please refer to Fleischner guidelines listed below for follow-up 
recommendations. Smaller 2 to 3 mm micronodules are seen elsewhere. 
Enlarged thyroid gland which has been followed with previous sonography. 
Emphysematous changes, degenerative changes and atelectatic changes. 
Mildly enlarging lipid rich left adrenal adenoma when compared to the 6594 exam. 
Horseshoe kidney as well as hepatic and renal cysts. 
Postop changes. 
REFERENCE: Recommendations for pulmonary nodule follow-up according to 
[HOSPITAL] Guidelines. 
Solitary pure ground-glass nodule 
Nodule size < 6mm 
*No ct follow-up is required 
Nodule size >/= 6mm 
*Follow-up CT at 6-12 months, then every 2 years until 5 years 
Solitary part-solid nodule 
Nodule size < 6mm 
*No ct follow-up is required 
Nodule size >/= 6mm 
*Follow-up CT at 3-6 months 
*If unchanged, and solid component remains < 6mm, then annual follow-up for 5 
years. 
Multiple nodules size: < 6mm 
*Low risk patients: no routine follow-up. 
*High risk patients: optional CT at 12 months. 
Multiple nodules size: 6-8mm 
*Low risk patients: follow-up at 3-6 months, then consider further follow-up at 
18-24 months. 
*High risk patients: follow-up at 3-6 months, then at 18-24 months if no change. 
Multiple nodules size: > 8mm 
*Low risk patients: follow-up at 3-6 months, then consider further follow-up at 
18-24 months. 
*High risk patients: follow-up at 3-6 months, then at 18-24 months if no change. 
Size is average of length and width. 
REPORT ******** 
CT CHEST/ABDOMEN/PELVIS W/WO CONTRAST, 10/02/2019 [DATE]: 
CLINICAL INDICATION: Six-month follow-up for left adrenal nodule. Follow-up 
pulmonary nodule. Chest pain from fall one week ago. Previous hysterectomy. 
The patient's eGFR was calculated to be 69 using the i-STAT device.
FINDINGS: Enlarged nodular thyroid gland. Most likely goiter. Patient has had multiple 
prior sonograms as well as previous thyroid biopsy. Emphysematous changes, 
atherosclerotic changes and degenerative changes. No significant coronary 
calcifications. No mediastinal mass or adenopathy. No pleural effusions. Mild 
bibasilar atelectasis. There are few scattered micronodules. None greater than 2 
to 3 mm. There is a groundglass lesion laterally in the right upper lobe on 
image 48 measuring 5 mm in diameter. No suspicious abnormality seen elsewhere on 
the MIP slab reconstructed images. 
Numerous cyst are again scattered throughout the liver. The spleen, pancreas and 
right adrenal gland are normal in appearance. There is an enlarging left adrenal 
nodule. On the noncontrast images average Hounsfield unit of 1.3. This has gone 
from 16 x 11 mm back in 6594 currently 17 x 15 mm thought to be consistent with 
a mildly enlarging lipid rich left adrenal adenoma. Again note is made of 
horseshoe kidney with a thin isthmus connecting the 2 kidneys. No mass or 
obstructive changes. Simple cyst identified. Gallbladder is not distended. Very 
small fat-containing umbilical hernia. Bladder is unremarkable in appearance. 
Postop changes left hip.
IMPRESSION: By history there is a pulmonary nodule. I do not have prior films or reports to 
correlate. There is a 5 mm groundglass focus laterally in the right upper lobe. 
Please refer to Fleischner guidelines listed below for follow-up 
recommendations. Smaller 2 to 3 mm micronodules are seen elsewhere. 
Enlarged thyroid gland which has been followed with previous sonography. 
Emphysematous changes, degenerative changes and atelectatic changes. 
Mildly enlarging lipid rich left adrenal adenoma when compared to the 6594 exam. 
Horseshoe kidney as well as hepatic and renal cysts. 
Postop changes. 
REFERENCE: Recommendations for pulmonary nodule follow-up according to 
[HOSPITAL] Guidelines. 
Solitary pure ground-glass nodule 
Nodule size < 6mm 
*No ct follow-up is required 
Nodule size >/= 6mm 
*Follow-up CT at 6-12 months, then every 2 years until 5 years 
Solitary part-solid nodule 
Nodule size < 6mm 
*No ct follow-up is required 
Nodule size >/= 6mm 
*Follow-up CT at 3-6 months 
*If unchanged, and solid component remains < 6mm, then annual follow-up for 5 
years. 
Multiple nodules size: < 6mm 
*Low risk patients: no routine follow-up. 
*High risk patients: optional CT at 12 months. 
Multiple nodules size: 6-8mm 
*Low risk patients: follow-up at 3-6 months, then consider further follow-up at 
18-24 months. 
*High risk patients: follow-up at 3-6 months, then at 18-24 months if no change. 
Multiple nodules size: > 8mm 
*Low risk patients: follow-up at 3-6 months, then consider further follow-up at 
18-24 months. 
*High risk patients: follow-up at 3-6 months, then at 18-24 months if no change. 
Size is average of length and width.

## 2019-10-04 IMAGING — MR MRI LUMBAR SPINE W/WO CONTRAST
6 of 12 series · 15 of 48 positions shown · IV contrast (gadavist)
Comparison: CT chest abdomen pelvis October 02, 2019

******** ADDENDUM #1 ********/n 
Prior outside lumbar MRI February 19, 2019 is provided for comparison. 
There is no significant change in degenerative changes of the spine. Mild canal 
stenosis at L3-4 and L4-5 are unchanged. Hemangioma at T11 is unchanged. 
ORIGINAL REPORT ******** 
MRI LUMBAR SPINE W/WO CONTRAST, 10/04/2019 [DATE]: 
CLINICAL INDICATION: T11 lesion. Low back pain
TECHNIQUE: Sagittal T1, Sagittal T2, Sagittal STIR, Axial T2, Axial T1, Enhanced 
sagittal T1, Enhanced fat suppressed sagittal T1, and Enhanced axial T1 MR 
images of the lumbar spine were performed with and without intravenous 
enhancement.  6.5 cc's of gadavist is injected intravenously by hand. The 
patient's eGFR was calculated to be 69 using the i-STAT device.

[Series 201: survey · axial · 10.0mm · 1.39mm/px · 1 of 9 slices shown]
[im 1/9]
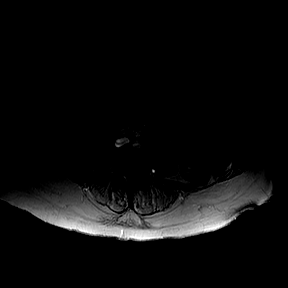

[Series 301: t2w_cor-surv · coronal · 6.0mm · 0.47mm/px · 1 of 5 slices shown]
[im 1/5]
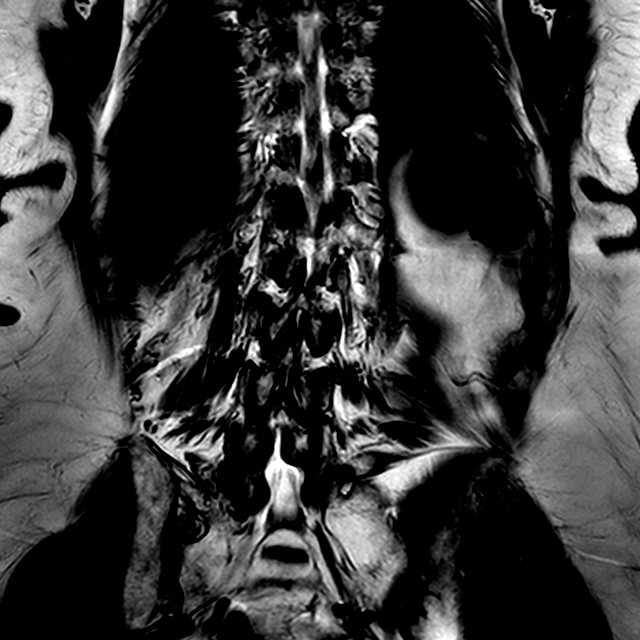

[Series 401: t2w_tse sag · sagittal · 4.0mm · 0.35mm/px · 2 of 17 slices shown]
[im 1/17]
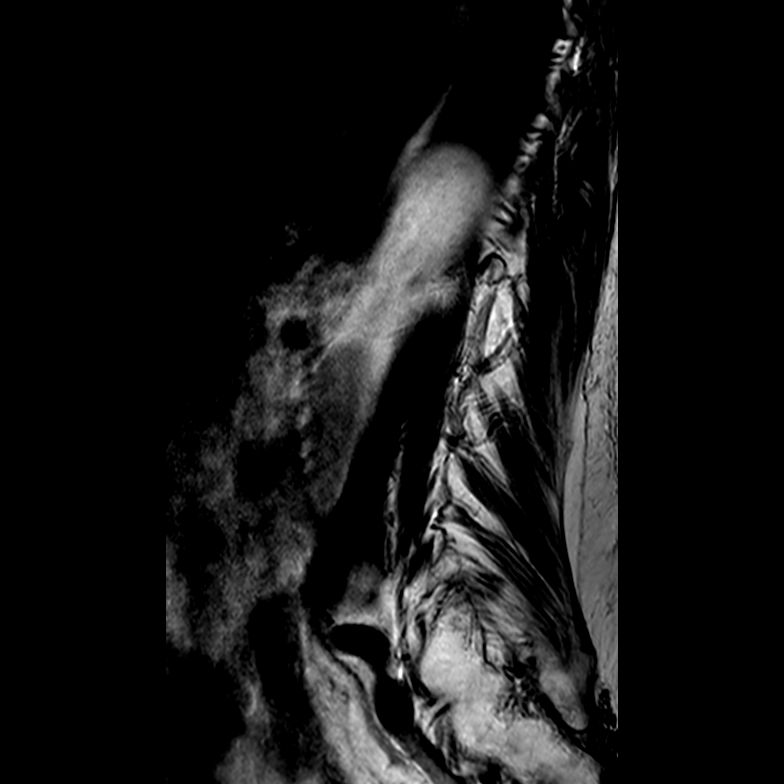
[im 17/17]
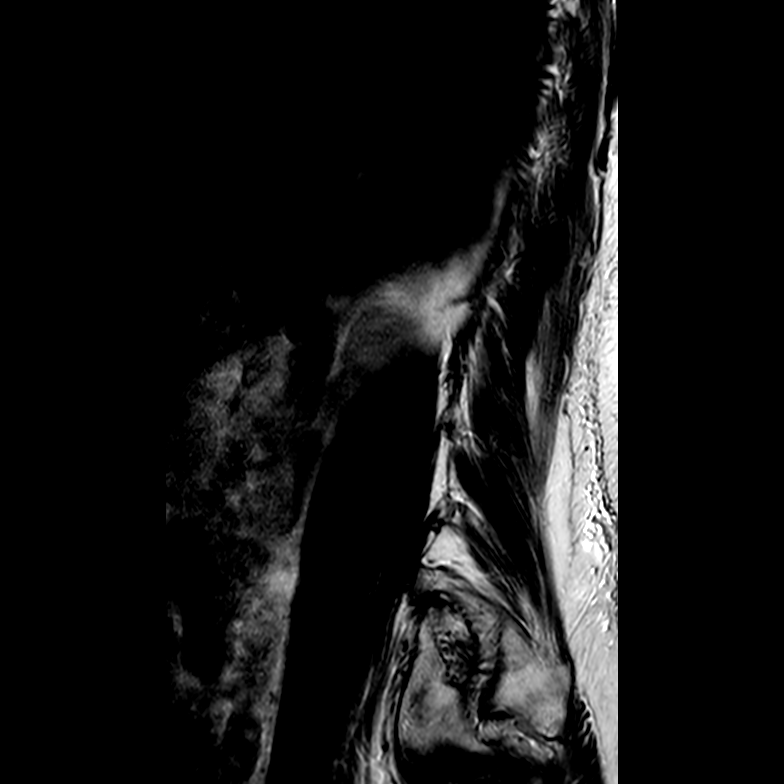

[Series 501: t1w_tse sag · sagittal · 4.0mm · 0.54mm/px · 2 of 17 slices shown]
[im 1/17]
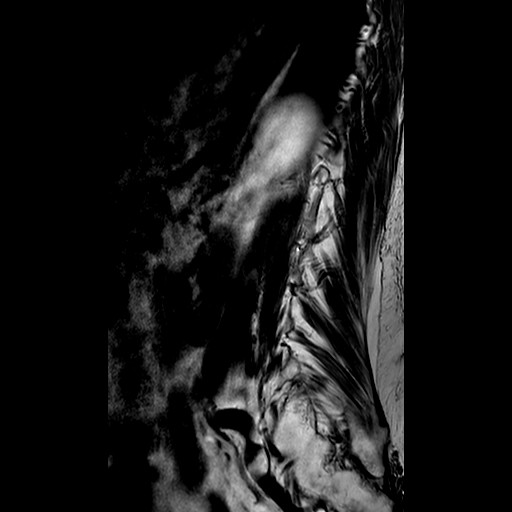
[im 17/17]
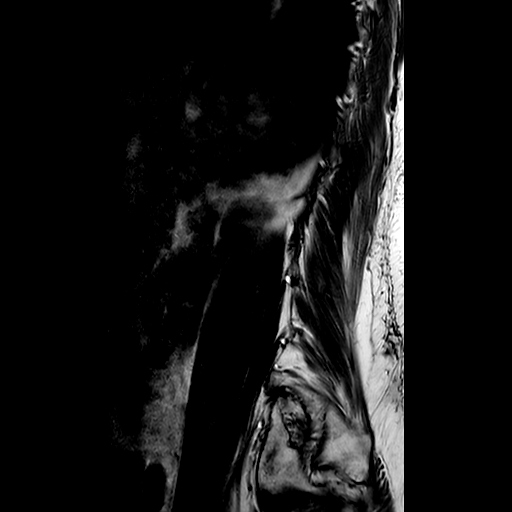

[Series 601: t2w_spair sag · sagittal · 4.0mm · 0.43mm/px · 2 of 17 slices shown]
[im 1/17]
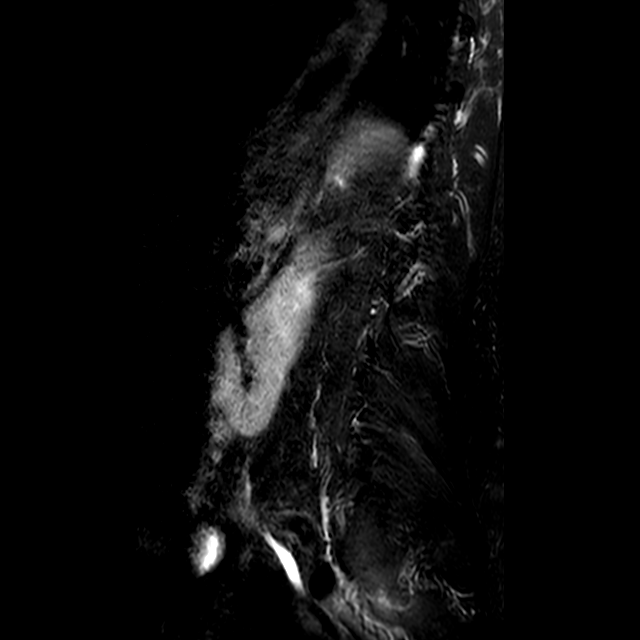
[im 17/17]
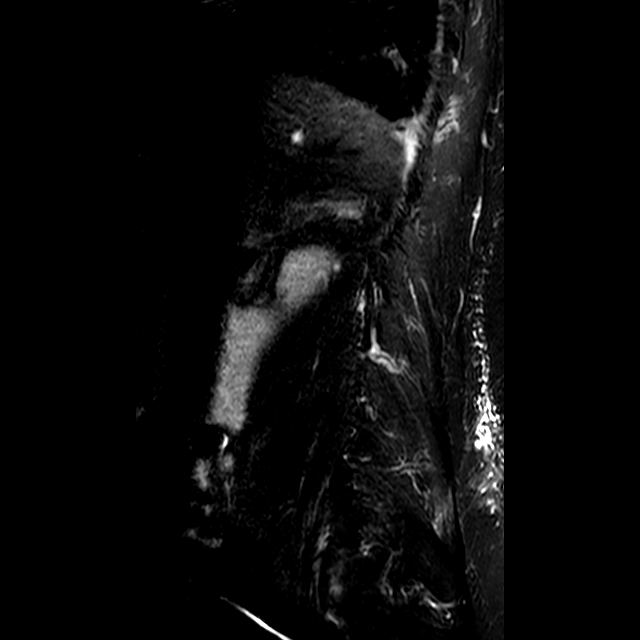

[Series 801: T1 · axial · 4.0mm · 0.35mm/px · z∈[-76,+166]mm · 7 of 42 slices shown]
[im 1/42]
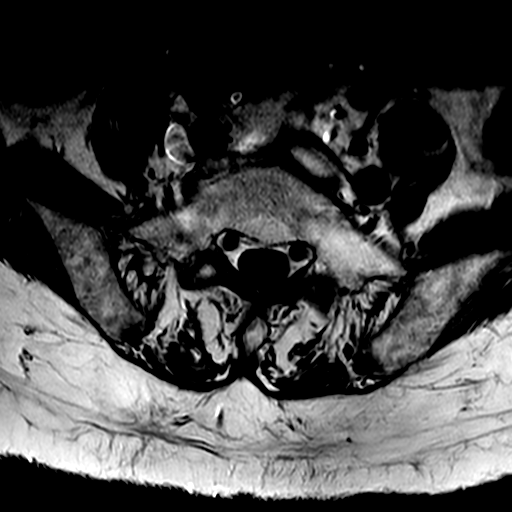
[im 7/42]
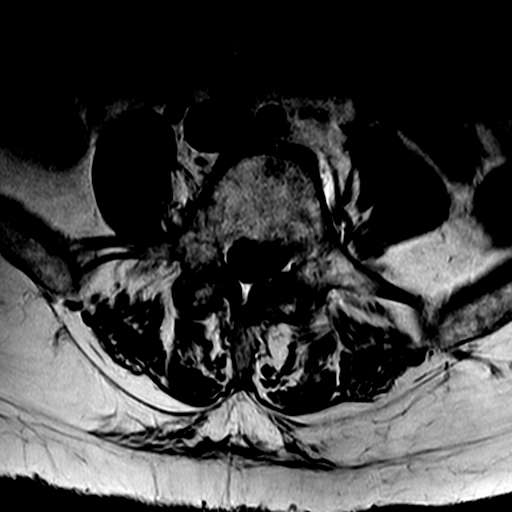
[im 14/42]
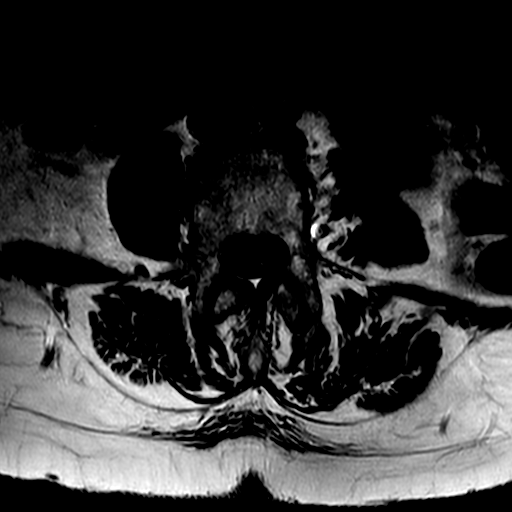
[im 21/42]
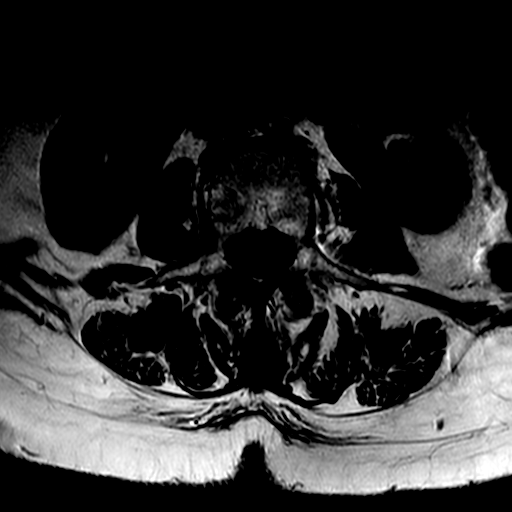
[im 28/42]
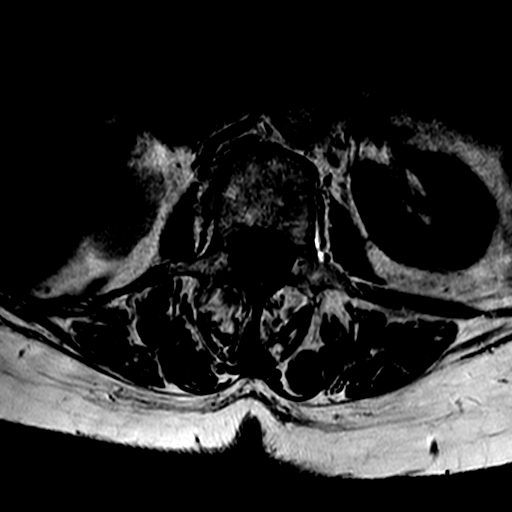
[im 35/42]
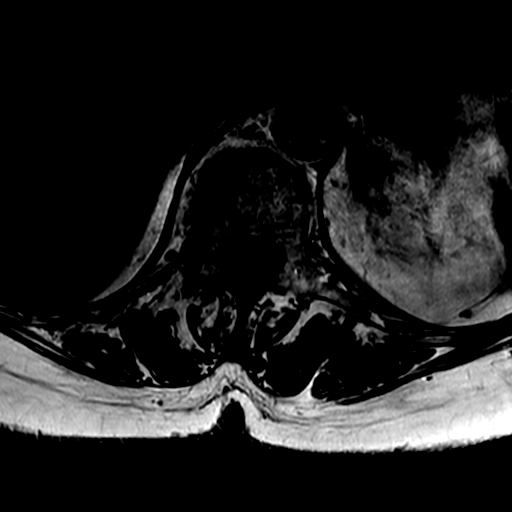
[im 42/42]
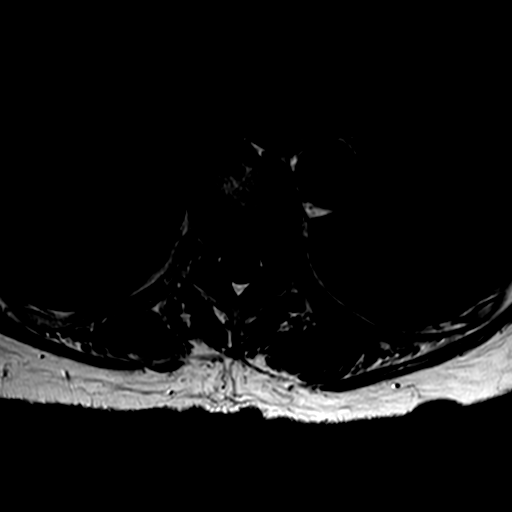

[15 of 48 positions shown; findings below may reference images not displayed]

FINDINGS: There is signal changes in the T11 vertebral segment compatible with 
benign hemangioma, which appears predominantly fat depleted, T2 hyperintense, 
sclerotic on the recent CT. There are smaller hemangiomas elsewhere, including 
L3 and T12. There are chronic endplate changes at L1-2, and there is mild to 
moderate degenerative reactive edema at T12-L1, and along the left L4-5 
interspace. There is marked disc narrowing at L1-2 and involving the left L4-5 
interspace. There is slight anterolisthesis of L4 on L5, approximately 1 to 2 
mm. There is marked narrowing at L5-S1 and moderate narrowing at L2-3. 
Axial images show no significant lumbar canal stenosis. There is mild stenosis 
at L3-4 and on the left at L4-5 due to acquired degenerative changes. There are 
moderate marked facet changes at the lower 4 lumbar levels. There is no 
significant foraminal stenosis. 
There is no pathologic osseous or intrathecal enhancement. The conus medullaris 
and lower cord show normal signal allowing for mild motion artifact, conus tip 
opposite L1-2. 
Coronal images show mild lower lumbar dextrocurvature with mild thoracolumbar 
levoscoliosis.
IMPRESSION: Hemangioma within the T11 segment, smaller foci elsewhere. No evidence for 
spinal malignancy. 
Multilevel degenerative changes. There is mild canal stenosis at L3-4 and on the 
left at L4-5. There are moderate marked facet changes from L2 through S1. 
There is degenerative reactive endplate edema, most pronounced on the right at 
T12-L1, on the left at L4-5. No evidence for compression fracture.

## 2019-10-19 ENCOUNTER — Encounter

## 2019-10-19 MED ORDER — FUROSEMIDE 40 MG PO TABS
40 MG | ORAL_TABLET | Freq: Every day | ORAL | 2 refills | Status: DC
Start: 2019-10-19 — End: 2022-04-27

## 2019-10-19 NOTE — Telephone Encounter (Signed)
Pt needs a new rx on furosemide (LASIX) 40 MG tablet to be sent to [facility] in [place]

## 2019-10-19 NOTE — Telephone Encounter (Signed)
 done  Orders Placed This Encounter   Medications   . furosemide  (LASIX ) 40 MG tablet     Sig: Take 1 tablet by mouth daily     Dispense:  30 tablet     Refill:  2

## 2019-10-19 NOTE — Telephone Encounter (Signed)
That note from jill has disappeared!  Can we find out where the lasix was to go

## 2019-10-19 NOTE — Telephone Encounter (Signed)
-----   Message from Barron Alvine, MD sent at 10/19/2019  8:05 AM EDT -----  I could not open the note to refill her medicine. lasix  The computer would not let me "I do do not have the security clearance to open the encounter"  Are you able to create some kind of note or refill   pwong

## 2019-10-20 LAB — CREATININE
Creatinine: 1.4 mg/dL — ABNORMAL HIGH (ref 0.6–1.2)
GFR African American: 44 — AB (ref >60)
GFR Non-African American: 36 — AB (ref >60)

## 2019-10-20 LAB — AST: AST: 20 U/L (ref 15–37)

## 2019-10-20 LAB — ALT: ALT: 12 U/L (ref 10–40)

## 2019-10-22 ENCOUNTER — Ambulatory Visit: Admit: 2019-10-22 | Discharge: 2019-10-22 | Payer: MEDICARE | Attending: Family Medicine | Primary: Family Medicine

## 2019-10-22 ENCOUNTER — Encounter

## 2019-10-22 DIAGNOSIS — I10 Essential (primary) hypertension: Secondary | ICD-10-CM

## 2019-10-22 LAB — TSH: TSH: 2.05 u[IU]/mL (ref 0.27–4.20)

## 2019-10-22 LAB — CBC WITH AUTO DIFFERENTIAL
Basophils %: 0.9 %
Basophils Absolute: 0.1 10*3/uL (ref 0.0–0.2)
Eosinophils %: 1.1 %
Eosinophils Absolute: 0.1 10*3/uL (ref 0.0–0.6)
Hematocrit: 34.7 % — ABNORMAL LOW (ref 36.0–48.0)
Hemoglobin: 11.3 g/dL — ABNORMAL LOW (ref 12.0–16.0)
Lymphocytes %: 14 %
Lymphocytes Absolute: 0.9 10*3/uL — ABNORMAL LOW (ref 1.0–5.1)
MCH: 27.3 pg (ref 26.0–34.0)
MCHC: 32.6 g/dL (ref 31.0–36.0)
MCV: 83.8 fL (ref 80.0–100.0)
MPV: 10.1 fL (ref 5.0–10.5)
Monocytes %: 6.1 %
Monocytes Absolute: 0.4 10*3/uL (ref 0.0–1.3)
Neutrophils %: 77.9 %
Neutrophils Absolute: 5.2 10*3/uL (ref 1.7–7.7)
Platelets: 241 10*3/uL (ref 135–450)
RBC: 4.15 M/uL (ref 4.00–5.20)
RDW: 14.8 % (ref 12.4–15.4)
WBC: 6.7 10*3/uL (ref 4.0–11.0)

## 2019-10-22 LAB — BASIC METABOLIC PANEL
Anion Gap: 16 (ref 3–16)
BUN: 29 mg/dL — ABNORMAL HIGH (ref 7–20)
CO2: 24 mmol/L (ref 21–32)
Calcium: 9.6 mg/dL (ref 8.3–10.6)
Chloride: 103 mmol/L (ref 99–110)
Creatinine: 1.4 mg/dL — ABNORMAL HIGH (ref 0.6–1.2)
GFR African American: 44 — AB (ref >60)
GFR Non-African American: 36 — AB (ref >60)
Glucose: 121 mg/dL — ABNORMAL HIGH (ref 70–99)
Potassium: 4 mmol/L (ref 3.5–5.1)
Sodium: 143 mmol/L (ref 136–145)

## 2019-10-22 LAB — LIPID PANEL
Cholesterol, Total: 258 mg/dL — ABNORMAL HIGH (ref 0–199)
HDL: 100 mg/dL — ABNORMAL HIGH (ref 40–60)
LDL Calculated: 136 mg/dL — ABNORMAL HIGH (ref <100)
Triglycerides: 109 mg/dL (ref 0–150)
VLDL Cholesterol Calculated: 22 mg/dL

## 2019-10-22 NOTE — Patient Instructions (Signed)
Do see dr Doristine Johns for a cardiology check up  Do fasting blood work  See [name] in about 6 months

## 2019-10-22 NOTE — Progress Notes (Signed)
 Subjective:      Patient ID: RIKAYLA DEMMON is a 79 y.o. female.    Chief Complaint   Patient presents with   . Follow-up     hypertension, lipids - pt is fasting        Patient presents with:  Follow-up: hypertension, lipids - pt is fasting    She is overall well  She is on the meds    She has noted some doe for a year if walking but it comes and goes   No cp no orthopnea  No fever no cold sx  No n no v no abd pain.  She did have today some upset stomach however  No urine sx  bm ok no blood   No ha  Normally but may have occ one  Some end of day swelling and not new  No syncope  At time light headed if gets up too fast    She is due to see her cardiologist dr Felipe Horton     Date of Birth:  [birth year]    Date of Visit:  10/22/2019     -- Pcn (Penicillins) -- Swelling    --  Face and hands swelled   -- Triamterene      --  Severe stomach pain,SOB,severe cramps throughout             her body   -- Ace Inhibitors -- Other (See Comments)    --  cough   -- Atorvastatin -- Other (See Comments)    --  Muscle aches   -- Zithromax (Azithromycin) -- Swelling   -- Codeine -- Nausea And Vomiting    Current Outpatient Medications:  Cyanocobalamin  (B-12 PO), Take by mouth, Disp: , Rfl:   furosemide  (LASIX ) 40 MG tablet, Take 1 tablet by mouth daily, Disp: 30 tablet, Rfl: 2  leflunomide  (ARAVA ) 20 MG tablet, Take 1 tablet by mouth daily, Disp: 30 tablet, Rfl: 3  predniSONE  (DELTASONE ) 5 MG tablet, Take 1 tablet by mouth daily, Disp: 30 tablet, Rfl: 2  gabapentin  (NEURONTIN ) 300 MG capsule, Take 2 capsules by mouth Daily with supper for 180 days., Disp: 180 capsule, Rfl: 1  DULoxetine  (CYMBALTA ) 60 MG extended release capsule, Take 1 capsule by mouth daily, Disp: 30 capsule, Rfl: 2  levothyroxine  (SYNTHROID ) 75 MCG tablet, TAKE 1 TABLET BY MOUTH DAILY, Disp: 90 tablet, Rfl: 1  hydroxychloroquine  (PLAQUENIL ) 200 MG tablet, Take 1 tablet by mouth daily, Disp: 30 tablet, Rfl: 5  vitamin D (CHOLECALCIFEROL) 25 MCG (1000 UT) TABS tablet, Take  1 tablet by mouth daily, Disp: , Rfl:   potassium chloride  (KLOR-CON ) 10 MEQ extended release tablet, Take 10 mEq by mouth 2 times daily, Disp: , Rfl:   diclofenac  sodium (VOLTAREN ) 1 % GEL, Apply 4 grams to lower extremity joints and 2 grams to upper extremity joints as needed 4 times/day, Disp: 500 g, Rfl: 5  melatonin 3 MG TABS tablet, Take 3 mg by mouth nightly , Disp: , Rfl:   aspirin  81 MG tablet, Take 81 mg by mouth daily., Disp: , Rfl:     No current facility-administered medications for this visit.      ----------------------------                10/22/19                       1035         ----------------------------   BP:  118/82         Site:     Left Upper Arm     Position:     Sitting         Cuff Size:   Large Adult       Pulse:          80           Temp:   97.2 F (36.2 C)    TempSrc:     Temporal        Weight:  186 lb (84.4 kg)    Height: 4' 11.5 (1.511 m)  ----------------------------  Body mass index is 36.94 kg/m.     Wt Readings from Last 3 Encounters:  10/22/19 : 186 lb (84.4 kg)  09/22/19 : 188 lb 3.2 oz (85.4 kg)  08/17/19 : 183 lb (83 kg)    BP Readings from Last 3 Encounters:  10/22/19 : 118/82  09/22/19 : 130/70  08/17/19 : (!) 142/90                    Review of Systems    Objective:   Physical Exam  Constitutional:       General: She is not in acute distress.     Appearance: Normal appearance. She is well-developed. She is not ill-appearing or diaphoretic.   HENT:      Head: Normocephalic and atraumatic.   Eyes:      General: No scleral icterus.  Neck:      Thyroid: No thyroid mass or thyromegaly.   Cardiovascular:      Rate and Rhythm: Normal rate and regular rhythm.      Heart sounds: Normal heart sounds. No murmur heard.   No friction rub. No gallop.    Pulmonary:      Effort: Pulmonary effort is normal. No tachypnea, accessory muscle usage or respiratory distress.      Breath sounds: Normal breath sounds. No decreased breath  sounds, wheezing, rhonchi or rales.   Musculoskeletal:      Cervical back: Neck supple.   Lymphadenopathy:      Cervical: No cervical adenopathy.      Upper Body:      Right upper body: No supraclavicular adenopathy.      Left upper body: No supraclavicular adenopathy.   Skin:     General: Skin is warm and dry.      Coloration: Skin is not pale.   Neurological:      General: No focal deficit present.      Mental Status: She is alert.         Assessment:        Diagnosis Orders   1. Essential hypertension, benign  TSH without Reflex    Lipid Panel    Basic Metabolic Panel    CBC Auto Differential   2. Acquired hypothyroidism  TSH without Reflex   3. DOE (dyspnea on exertion)  CBC Auto Differential   4. Hx of breast cancer  MAM DIGITAL DIAGNOSTIC W OR WO CAD BILATERAL         She has seen rheumatology nephrology and rheumatology since the last visit and reviewed      Plan:      Do see dr Lyndell Sanfilippo for a cardiology check up  Do fasting blood work  See us  in about 6 months         Orysia Blas, MD

## 2019-11-16 ENCOUNTER — Encounter

## 2019-11-17 ENCOUNTER — Encounter: Attending: Clinical Neurophysiology | Primary: Family Medicine

## 2019-11-17 LAB — ALT: ALT: 21 U/L (ref 10–40)

## 2019-11-17 LAB — CREATININE
Creatinine: 1.3 mg/dL — ABNORMAL HIGH (ref 0.6–1.2)
GFR African American: 48 — AB (ref >60)
GFR Non-African American: 39 — AB (ref >60)

## 2019-11-17 LAB — AST: AST: 30 U/L (ref 15–37)

## 2019-11-18 ENCOUNTER — Encounter

## 2019-11-18 MED ORDER — DULOXETINE HCL 30 MG PO CPEP
30 MG | ORAL_CAPSULE | Freq: Every day | ORAL | 3 refills | Status: DC
Start: 2019-11-18 — End: 2020-04-04

## 2019-11-18 NOTE — Telephone Encounter (Signed)
Last visit 09/22/19  Labs  11/16/19  Next visit 01/18/20

## 2019-11-23 ENCOUNTER — Encounter: Attending: Internal Medicine | Primary: Family Medicine

## 2019-12-06 ENCOUNTER — Encounter

## 2019-12-07 MED ORDER — DICLOFENAC SODIUM 1 % EX GEL
1 % | CUTANEOUS | 1 refills | Status: AC
Start: 2019-12-07 — End: ?

## 2019-12-07 NOTE — Telephone Encounter (Signed)
Last visit  09/22/19  Labs 11/16/19  Next visit 01/18/20

## 2019-12-17 ENCOUNTER — Encounter

## 2019-12-18 LAB — CREATININE
Creatinine: 1.1 mg/dL (ref 0.6–1.2)
GFR African American: 58 — AB (ref >60)
GFR Non-African American: 48 — AB (ref >60)

## 2019-12-18 LAB — AST: AST: 23 U/L (ref 15–37)

## 2019-12-18 LAB — ALT: ALT: 17 U/L (ref 10–40)

## 2019-12-30 NOTE — Telephone Encounter (Signed)
Patient was exposed to covid on Friday 7 Saturday. Should she quarantine for 10 days? Please advise    [phone]-Ayana

## 2019-12-31 NOTE — Telephone Encounter (Signed)
Pt advised and understood.

## 2019-12-31 NOTE — Telephone Encounter (Signed)
A week if no symptoms

## 2019-12-31 NOTE — Telephone Encounter (Signed)
[phone] - Left msg for Mickie to return call.

## 2020-01-02 ENCOUNTER — Encounter

## 2020-01-05 MED ORDER — PREDNISONE 5 MG PO TABS
5 MG | ORAL_TABLET | Freq: Every day | ORAL | 2 refills | Status: DC
Start: 2020-01-05 — End: 2020-03-31

## 2020-01-05 NOTE — Telephone Encounter (Signed)
Next visit 01/18/20  Lab 12/17/19  Last visit 09/22/19

## 2020-01-07 NOTE — Telephone Encounter (Signed)
FYI:    Pt had a COVID test on 9/7 and it was positive. Pt wants for Dr Christel Mormon to be made aware.

## 2020-01-08 NOTE — Telephone Encounter (Signed)
Pt daughter Earnest Bailey advised

## 2020-01-08 NOTE — Telephone Encounter (Signed)
Is Jodi Walls feeling poorly   Any sob?  She should treat symptoms  If worse to the ER   If they both have had covid recently then yes they can be around each other

## 2020-01-08 NOTE — Telephone Encounter (Addendum)
Daughter is calling about mother. Patient tested positive for covid. Daughter is also positive but on day 14. Can she go around her now. Symptoms: Body aches, fatigue, congestion, doesn't know about fever. Daughter wants to know best way to help her & what to look for. Patient received positive test on fri    Please advise   [facility]

## 2020-01-13 ENCOUNTER — Encounter

## 2020-01-13 MED ORDER — HYDROXYCHLOROQUINE SULFATE 200 MG PO TABS
200 MG | ORAL_TABLET | Freq: Every day | ORAL | 5 refills | Status: DC
Start: 2020-01-13 — End: 2020-07-04

## 2020-01-13 NOTE — Telephone Encounter (Signed)
She needs to go to the ER if she is still feeling that bad .

## 2020-01-13 NOTE — Telephone Encounter (Signed)
Daughter is calling about mother. Mom was diagnosed with covid, 12, days ago.Mom is still lethargic,not eating, bad body aches,cough, dizziness,bad headache,congestion, runny nose,chills, voice goes in/out,swollen glands, No smell or taste patient's temp was 99.7 with an 8 hr fever reducer.(last taken at 3 am).    Patient has not had an antibiotic. Or steroid. Patient is allergic to Z-Pack & amoxicillin. Patient does not want to go to ER b/c of high deductible. Daughter stated if she has to she will make her go to ER    Please advise  [phone]-[phone]    Quanika [phone]

## 2020-01-13 NOTE — Telephone Encounter (Signed)
Last visit 09/22/19  Labs 12/17/19  Next visit 01/18/20

## 2020-01-13 NOTE — Telephone Encounter (Signed)
Holly advised and expressed understanding.

## 2020-01-14 ENCOUNTER — Encounter

## 2020-01-14 MED ORDER — LEVOTHYROXINE SODIUM 75 MCG PO TABS
75 MCG | ORAL_TABLET | ORAL | 1 refills | Status: DC
Start: 2020-01-14 — End: 2020-06-29

## 2020-01-14 MED ORDER — LEFLUNOMIDE 20 MG PO TABS
20 MG | ORAL_TABLET | Freq: Every day | ORAL | 2 refills | Status: DC
Start: 2020-01-14 — End: 2020-05-02

## 2020-01-14 NOTE — Telephone Encounter (Signed)
Last visit 09/22/19  Labs 12/17/19  Next visit 01/18/20

## 2020-01-18 ENCOUNTER — Encounter: Attending: Internal Medicine | Primary: Family Medicine

## 2020-01-18 NOTE — Progress Notes (Deleted)
01/18/2020     Patient Name: Jodi Walls  DOB: Nov 01, 1940  MEDICAL RECORD [id number]    MEDICATIONS  Current Outpatient Medications   Medication Sig Dispense Refill   . levothyroxine (SYNTHROID) 75 MCG tablet TAKE 1 TABLET BY MOUTH DAILY 90 tablet 1   . leflunomide (ARAVA) 20 MG tablet TAKE 1 TABLET BY MOUTH DAILY 30 tablet 2   . hydroxychloroquine (PLAQUENIL) 200 MG tablet TAKE 1 TABLET BY MOUTH DAILY 30 tablet 5   . predniSONE (DELTASONE) 5 MG tablet TAKE 1 TABLET BY MOUTH DAILY 30 tablet 2   . diclofenac sodium (VOLTAREN) 1 % GEL APPLY 4 GRAMS TO LOWER EXTREMITY JOINTS AND 2 GRAMS TO UPPER EXTREMITY JOINTS FOUR TIMES DAILY AS NEEDED 500 g 1   . DULoxetine (CYMBALTA) 30 MG extended release capsule TAKE 1 CAPSULE BY MOUTH DAILY 30 capsule 3   . Cyanocobalamin (B-12 PO) Take by mouth     . furosemide (LASIX) 40 MG tablet Take 1 tablet by mouth daily 30 tablet 2   . gabapentin (NEURONTIN) 300 MG capsule Take 2 capsules by mouth Daily with supper for 180 days. 180 capsule 1   . DULoxetine (CYMBALTA) 60 MG extended release capsule Take 1 capsule by mouth daily 30 capsule 2   . vitamin D (CHOLECALCIFEROL) 25 MCG (1000 UT) TABS tablet Take 1 tablet by mouth daily     . potassium chloride (KLOR-CON) 10 MEQ extended release tablet Take 10 mEq by mouth 2 times daily     . melatonin 3 MG TABS tablet Take 3 mg by mouth nightly      . aspirin 81 MG tablet Take 81 mg by mouth daily.       No current facility-administered medications for this visit.       ALLERGIES  Allergies   Allergen Reactions   . Pcn [Penicillins] Swelling     Face and hands swelled   . Triamterene      Severe stomach pain,SOB,severe cramps throughout her body   . Ace Inhibitors Other (See Comments)     cough   . Atorvastatin Other (See Comments)     Muscle aches   . Zithromax [Azithromycin] Swelling   . Codeine Nausea And Vomiting         Comments  No specialty comments available.    Background history:  Jodi Walls is a 79 y.o. female breast cancer  status post lumpectomy, hypertension, osteoarthritis, hypothyroidism, CKD 3 who is being seen for follow up evaluation of  joint pain.  She is complaining of pain in her hands and feet.  We started 2 years ago.  Symptoms have progressively worsened over the past 1 year.  She has pain on daily basis, 10 out of 10.  She is swelling in her knuckles and feet.  She has morning stiffness lasting for 1 hour.  She has difficulty opening doorknobs and jars.  She has developed weakness in her hands and is dropping things.  She takes Tylenol 1000 mg twice a day with minimal relief.  She also takes Aleve as needed.  She is unable to take oral NSAIDs due to CKD 3.  She has a history of left knee replacement.  She has osteoarthritis in the right knee as well.  Right knee cracks and pops.  Pain in the right knee gets worse going up and down the steps and standing from the sitting position.  She had a trauma to the left hand in October 2019.  Since then left third MCP joint stays swollen and painful.  She had x-ray of the left hand which did not show any fracture.  She denies psoriasis, inflammatory bowel disease, inflammatory back pain, dactylitis, enthesitis, tenosynovitis or uveitis.  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  Her mother had arthritis  She had work-up with PCP which showed negative RF, ESR and normal TSH.    Interim history: She presents for follow-up of osteoarthritis, CPPD.  She takes Tylenol 1000 mg twice a day and diclofenac gel with minimal relief.  She was placed on Plaquenil 200 mg daily 3 months ago which has not helped either.  She continues to complain of pain, swelling and stiffness in the joints.  Symptoms are worse in her hands and feet.  She had left third MCP joint arthroplasty.  She also saw a neurologist for peripheral neuropathy.  Additional work-up was ordered which is in process.  She takes gabapentin 300 mg at bedtime and Cymbalta 60 mg but she  still has tingling and numbness in her hands and feet.  She has vitamin B1 and vitamin B6 deficiency.  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  Work-up came back negative for rheumatoid arthritis.  Hand and foot x-rays showed moderate to severe degenerative changes with CPPD.  She denies any pseudogout flareups.  Work-up negative for secondary causes of pseudogout.  She denies any side effects with Plaquenil.  She denies any recent fevers or infections.Marland Kitchen  HPI  Review of Systems    REVIEW OF SYSTEMS:   Constitutional: No unanticipated weight loss or fevers.   Integumentary: No rash, photosensitivity, malar rash, livedo reticularis, alopecia and Raynaud's symptoms, sclerodactyly, skin tightening  Eyes: negative for visual disturbance and persistent redness, discharge from eyes   ENT: - No tinnitus, loss of hearing, vertigo, or recurrent ear infections.  - No history of nasal/oral ulcers.  - No history of dry eyes/dry mouth  Cardiovascular: No history of pericarditis, chest pain or murmur or palpitations  Respiratory: No shortness of breath, cough or history of interstitial lung disease.No history of pleurisy. No history of tuberculosis or atypical infections.  Gastrointestinal: No history of heart burn, dysphagia or esophageal dysmotility. No change in bowel habits or any inflammatory bowel disease.  Genitourinary: No history miscarriages.  Hematologic/Lymphatic: No abnormal bruising or bleeding, blood clots or swollen lymph nodes.  Neurological: No history of headaches, seizure or focal weakness. No history of neuropathies, paresthesias or hyperesthesias, facial droop, diplopia  Psychiatric: No history of bipolar disease, anxiety, depression  Endocrine: Denies any polyuria, polydipsia and osteoporosis  Allergic/Immunologic: No nasal congestion or hives.        I have reviewed patients Past medical History, Social History and Family History as mentioned in her chart and  this remains unchanged fromprevious.    Past Medical History:   Diagnosis Date   . Anemia    . Arthritis    . Cancer ([place]) 1995    hx of breast cancer--no chemo or radiation needed   . GERD (gastroesophageal reflux disease)    . Hx of blood clots 2001    after gallbladder surgery--blood clot in arm where IV had been   . Hypothyroidism    . Kidney failure     stage 3   . PONV (postoperative nausea and vomiting)    . Wears glasses     reading     Past Surgical History:  Procedure Laterality Date   . APPENDECTOMY     . ARTHROPLASTY Left 12/25/2018    LEFT MIDDLE FINGER METACARPO-PHALANGEAL JOINT SYNOVECTOMY, LIGAMENT REBALANCING, EXTENSOR TENDON CENTRALIZATION AND SILICONE METACARPOPHALANGEAL JOINT ARTHROPLASTY performed by Delene Loll, MD at [facility]   . BACK SURGERY      r/t to herniated disc in lower back, neck area--screws and rods in place   . BREAST BIOPSY      left breast biopsy   . BREAST SURGERY  1995    right mastectomy   . CHOLECYSTECTOMY  2001   . COLONOSCOPY  04/12/2015    Esophagogastroduodenoscopy with biopsy, Colonoscopy, diagnostic. no findings except for diverticula and check in 10 years   . HYSTERECTOMY      TAH-BSO. no  cancer   . JOINT REPLACEMENT Left     left total knee replacement   . LIPOMA RESECTION      right side below right breast   . MASTECTOMY, RADICAL  1995   . UPPER GASTROINTESTINAL ENDOSCOPY  04/12/2015    and colonoscopy. hiatal hernia and gastritis     Social History     Socioeconomic History   . Marital status: Widowed     Spouse name: Not on file   . Number of children: Not on file   . Years of education: Not on file   . Highest education level: Not on file   Occupational History   . Not on file   Tobacco Use   . Smoking status: Never Smoker   . Smokeless tobacco: Never Used   Vaping Use   . Vaping Use: Never used   Substance and Sexual Activity   . Alcohol use: Yes     Comment: social-rare. once a year   . Drug use: No   . Sexual activity: Not Currently   Other Topics Concern    . Not on file   Social History Narrative   . Not on file     Social Determinants of Health     Financial Resource Strain: Low Risk    . Difficulty of Paying Living Expenses: Not hard at all   Food Insecurity: No Food Insecurity   . Worried About Charity fundraiser in the Last Year: Never true   . Ran Out of Food in the Last Year: Never true   Transportation Needs:    . Lack of Transportation (Medical):    Marland Kitchen Lack of Transportation (Non-Medical):    Physical Activity:    . Days of Exercise per Week:    . Minutes of Exercise per Session:    Stress:    . Feeling of Stress :    Social Connections:    . Frequency of Communication with Friends and Family:    . Frequency of Social Gatherings with Friends and Family:    . Attends Religious Services:    . Active Member of Clubs or Organizations:    . Attends Archivist Meetings:    Marland Kitchen Marital Status:    Intimate Partner Violence:    . Fear of Current or Ex-Partner:    . Emotionally Abused:    Marland Kitchen Physically Abused:    . Sexually Abused:      Family History   Problem Relation Age of Onset   . Cancer Mother         lung    . Cancer Father         bladder   . Heart Disease Father    .  High Blood Pressure Father    . Cancer Maternal Aunt         ovarian   . Cancer Paternal Cousin         ovarian       There were no vitals filed for this visit.  Physical Exam  Constitutional:  Well developed, well nourished, no acute distress, non-toxic appearance   Musculoskeletal:    RIGHT  Swell  Tender  ROM  LEFT  Swell  Tender  ROM    DIP2  0  0   [place]  0  0   [place]   DIP3  0  0   [place]  0  0   [place]   DIP4  0  0   [place]  0  0   [place]   DIP5  0  0   [place]  0  0   [place]   PIP1  0  0   bony change  0  0   bony change   PIP2  0  0  FULL   0  0  FULL    PIP3  0  0  FULL   0  0  FULL    PIP4  0  0  FULL   0  0  FULL    PIP5  0  0  FULL   0  0  FULL    MCP1  0  0   bony change  0  0   bony change   MCP2  0  0   bony change  0  0   bony change   MCP3  0  0   bony  change  0  0   bony change+++   MCP4  0  0  FULL   0  0  FULL    MCP5  0  0  FULL   0  0  FULL    Wrist  0  0  FULL   0  0  FULL    Elbow  0  0  FULL   0  0  FULL    Shouldr  0  0  FULL   0  0  FULL    Hip  0  0  FULL   0  0  FULL    Knee  0  0  CREPITUS/VARUS   0  0  TKR   Ankle  0  0  FULL   0  0  FULL    MTP1  0  + FULL   0  + FULL    MTP2  0  + FULL   0  +  FULL    MTP3  0  + FULL   0  +  FULL    MTP4  0  + FULL   0  +  FULL    MTP5  0  + FULL   0  +  FULL    IP1  0  0  FULL   0  0  FULL    IP2  0  0  FULL   0  0  FULL    IP3  0  0  FULL   0  0  FULL    IP4  0  0  FULL   0  0  FULL    IP5  0  0  FULL   0 0 FULL     Squaring  and bony enlargement of bilateral CMC joints  Trace soft tissue swelling in bilateral PIP joints  Ambulates without assistance, normal gait  Neck: Full ROM, no tenderness,supple   Back-  Tenderness++  Eyes:  PERRL, extra ocular movements intact, conjunctiva normal   HEENT:  Atraumatic, normocephalic, external ears normal, oropharynx moist, no pharyngeal exudates.   Respiratory:  No respiratory distress  GI:  Soft, nondistended, normal bowel sounds, nontender, noorganomegaly, no mass, no rebound, no guarding   GU:  No costovertebral angle tenderness   Integument:  Well hydrated, no rash or telangiectasias  Lymphatic:  No lymphadenopathy noted   Neurologic:   Alert & oriented x 3, CN 2-12 normal, no focal deficits noted. Sensations Intact. Muscle strength 5/5 proximallyand distally in upper and lower extremities.   Psychiatric:  Speech and behavior appropriate           LABS AND IMAGING  Outside data reviewed and in HPI    Lab Results   Component Value Date    WBC 6.7 10/22/2019    RBC 4.15 10/22/2019    RBC 4.72 10/26/2015    HGB 11.3 10/22/2019    HCT 34.7 10/22/2019    PLT 241 10/22/2019    MCV 83.8 10/22/2019    MCH 27.3 10/22/2019    MCHC 32.6 10/22/2019    RDW 14.8 10/22/2019    SEGSPCT 60.4 02/02/2010    LYMPHOPCT 14.0 10/22/2019    LYMPHOPCT 24.7 10/26/2015    MONOPCT 6.1 10/22/2019     EOSPCT 4.3 02/02/2010    BASOPCT 0.9 10/22/2019    MONOSABS 0.4 10/22/2019    LYMPHSABS 0.9 10/22/2019    EOSABS 0.1 10/22/2019    BASOSABS 0.1 10/22/2019    DIFFTYPE Auto 02/02/2010       Chemistry        Component Value Date/Time    NA 143 10/22/2019 1211    K 4.0 10/22/2019 1211    CL 103 10/22/2019 1211    CO2 24 10/22/2019 1211    BUN 29 (H) 10/22/2019 1211    CREATININE 1.1 12/17/2019 1332        Component Value Date/Time    CALCIUM 9.6 10/22/2019 1211    ALKPHOS 108 06/24/2019 0933    AST 23 12/17/2019 1332    ALT 17 12/17/2019 1332    BILITOT 0.5 06/24/2019 0933          Lab Results   Component Value Date    SEDRATE 24 06/24/2019     Lab Results   Component Value Date    CRP <3.0 06/24/2019     Lab Results   Component Value Date    ANA Negative 11/12/2018     Lab Results   Component Value Date    RF <10.0 10/24/2018     Lab Results   Component Value Date    ANA Negative 11/12/2018     No results found for: DSDNAG, DSDNAIGGIFA  No results found for: SSAROAB, SSALAAB  No results found for: SMAB, RNPAB  No results found for: CENTABIGG  No results found for: C3, C4, ACE  Lab Results   Component Value Date    VITD25 36.9 11/12/2018     No results found for: Thayer Ohm  Lab Results   Component Value Date    VITAMINB12 1860 (H) 06/19/2019     Lab Results   Component Value Date    TSH 2.05 10/22/2019     Lab Results   Component Value Date  VITD25 36.9 11/12/2018       Hand and foot x-rays 11/11/2018  FINDINGS:    Right hand:        Osseous alignment is normal.        Severe degenerative changes of the 1st [facility] joint. Mild degenerative changes    of the DIP joints and 1st IP joint. Chondrocalcinosis of the TFCC.        No marginal erosions are identified. No acute fracture or gross dislocation    is seen.        No focal soft tissue swelling is evident.        Left hand:        Osseous alignment is normal.        Mild degenerative changes of the DIP joints, 1st MCP joint and triscaphe    joint. Mild  to moderate degenerative change of the 1st [facility] joint.    Chondrocalcinosis of the TFCC.        No marginal erosions are identified. No acute fracture or gross dislocation    is seen.        No focal soft tissue swelling is evident.        Right foot:        Osseous alignment is normal. Mild to moderate degenerative changes of the    TMT joints primarily at the 1st TMT joint.        Moderate plantar calcaneal spur. Soft tissue swelling at the right forefoot.    Calcification along the plantar fascia. No marginal erosions are identified.    No acute fracture or gross dislocation is seen.        The medial and middle cuneiforms demonstrate proper alignment with the base    of the 1st and 2nd metatarsals respectively.        No tibiotalar joint effusion is seen.        Boehler's angle is maintained.        Left foot:        Osseous alignment is normal.        Moderate degenerative changes of the midfoot and tarsal metatarsal joints.    Remote healed fracture deformities of the 2nd 3rd metatarsal diaphyses. Mild    plantar calcaneal spur. Calcification of the plantar fascia. Os trigonum    variant. Soft tissue swelling of the left forefoot. No marginal erosions    are identified. No acute fracture or gross dislocation is seen.        The medial and middle cuneiforms demonstrate proper alignment with the base    of the 1st and 2nd metatarsals respectively.        No tibiotalar joint effusion is seen.        Boehler's angle is maintained.            Impression    Right hand:        1. Osteoarthrosis which is severe at the 1st [facility] joint. CPPD.    2. No acute fracture or dislocation.    Left hand:        1. Mild to moderate osteoarthrosis. CPPD.    2. No acute fracture or dislocation.    Right foot:        1. Mild-to-moderate degenerative changes as detailed above. Moderate plantar    calcaneal spur.    2. Soft tissue swelling of the right forefoot.    3. No acute fracture or dislocation.     Left foot:  1. Moderate diffuse degenerative changes as detailed above.    2. Remote healed fracture deformities of the 2nd and 3rd metatarsal diaphyses.    3. Mild plantar calcaneal spur.    4. Soft tissue swelling of the forefoot.    5. No acute fracture or dislocation.        MRI left hand 11/15/2018  1. Mild volar subluxation of the 3rd metacarpophalangeal joint with mild    degenerative changes and a small effusion.    2. Severe 1st carpometacarpal degenerative changes. Moderate 1st    interphalangeal degenerative changes       ASSESSMENT AND PLAN      Assessment/Plan:      ASSESSMENT:    No diagnosis found.    PLAN:   1.  Inflammatory osteoarthritis  Osteoarthritis/ Joint pain : Discussed about diagnosis and management of osteoarthritis. There are no FDA approved disease modifying agents. Goal is to control pain and increase mobility. Discussed the importance of wt loss, exercises, formal PT, joint braces/splints. First line of agent Tylenol arthritis, NSAIDs systemic and/ or topical,Cymbalta, pain meds, joint injections- steroids, and visco supplementaiton for knee OA. Also discussed about surgical options.  Continues to be symptomatic.  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  Hand and foot x-rays with moderate to severe degenerative arthritis with CPPD  Unable to take oral NSAIDs due to CKD  Advised to take Tylenol 1000 mg 3 times a day, do not exceed 3 g daily  Continue diclofenac gel  Continue Plaquenil 200 mg daily  Start leflunomide 20 mg daily and prednisone 5 mg daily  Refer to podiatry for bone spurs    2.  Peripheral neuropathy  Blood work showed vitamin B1 and vitamin B6 deficiency.  She was placed on replacement.  Magnesium, B12, TSH, copper SPEP, hemoglobin A1c normal.    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  Sees neurologist  Continue Cymbalta 60 mg daily and increase gabapentin to 600 mg at bedtime.      3. H/O calcium  pyrophosphate deposition disease (CPPD)  Work-up negative for secondary causes    {No diagnosis found. (Refresh or delete this SmartLink)}  Leflunomide can cause nausea/vomiting, diarrhea, elevated liver enzymes, bone marrow toxicity, rare cases of interstitial lung diseases, birth defects and were explained in detail to the patient. We have to check blood work every month for first 3 months and then every 2-3 months  Recommend once yearly flu vaccine, pneumonia and shingles vaccine    The patient indicates understanding of these issues and agrees with the plan.      No follow-ups on file.      The risks and benefits of my recommendations, as well as other treatment options, benefits and side effects werediscussed with the patient. All questions were answered.       ######################################################################    I thank you for giving me theopportunity to participate in [name] care. If you have any questions or concerns please feel free to contact me. I look forward to following  Rumor along with you.      Electronically signed by: Oletta Lamas, MD, MD, 01/18/2020 1:33 PM    Documentation was done using voice recognition dragon software.  Every effort was made to ensure accuracy;however, inadvertent unintentional computerized transcription errors may be present.

## 2020-01-20 IMAGING — MR MRI BRAIN WITHOUT CONTRAST
6 of 9 series · 27 of 48 positions shown · non-contrast
Comparison: Cranial CT October 11, 2016

KIMALYN DUMAS, ARNP 
MRI BRAIN WITHOUT CONTRAST, 01/20/2020 [DATE]: 
CLINICAL INDICATION: Memory loss
TECHNIQUE: Axial T1, Axial T2, Axial FLAIR, Diffusion weighted images, Sagittal 
T1, and Coronal T2 MR images of the brain were performed without intravenous 
contrast enhancement.

[Series 101: survey · axial · 10.0mm · 0.98mm/px · z∈[+0,+125]mm · 2 of 5 slices shown]
[im 1/5]
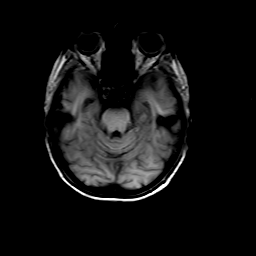
[im 5/5]
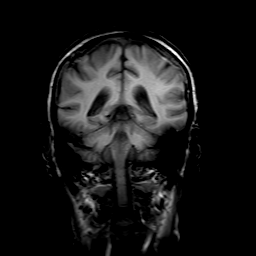

[Series 201: t1_se_sag · sagittal · 4.0mm · 0.51mm/px · 2 of 27 slices shown]
[im 1/27]
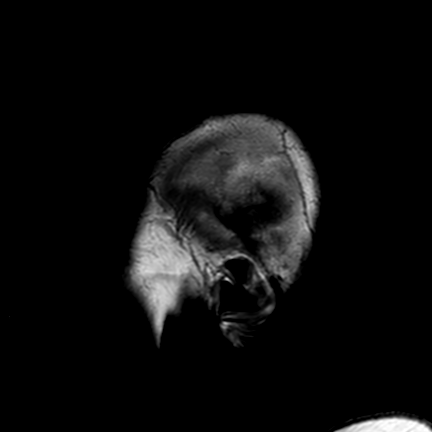
[im 7/27]
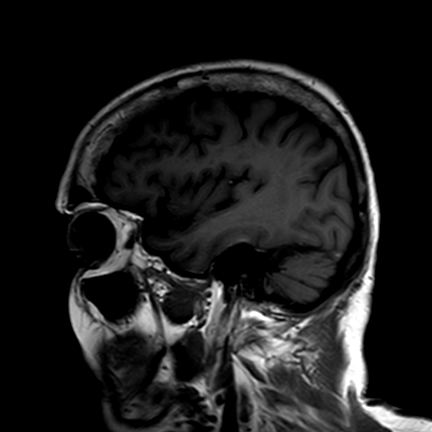

[Series 401: FLAIR · axial · 5.0mm · 0.65mm/px · z∈[-58,+98]mm · 4 of 27 slices shown]
[im 1/27]
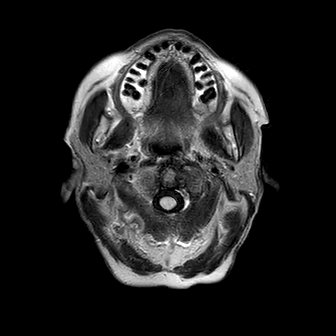
[im 9/27]
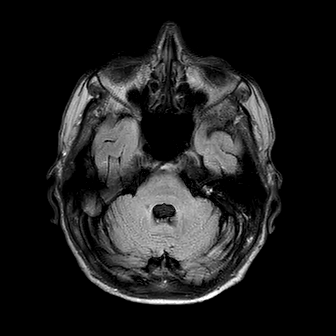
[im 18/27]
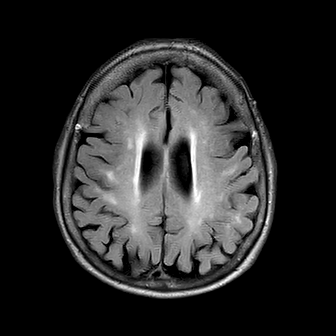
[im 27/27]
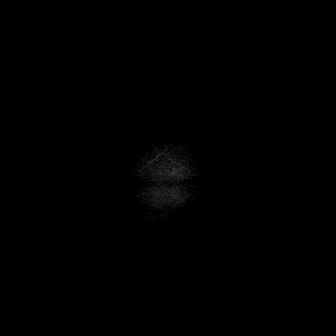

[Series 601: SWI · axial · 3.0mm · 0.53mm/px · z∈[-45,+94]mm · 10 of 100 slices shown]
[im 7/100]
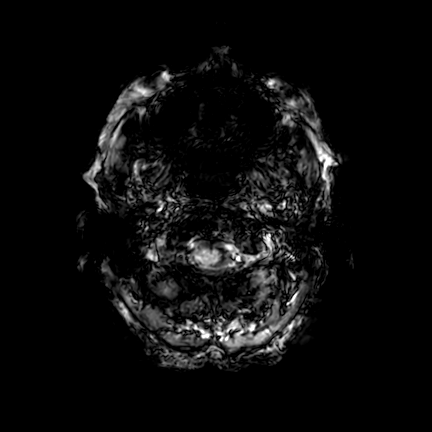
[im 14/100]
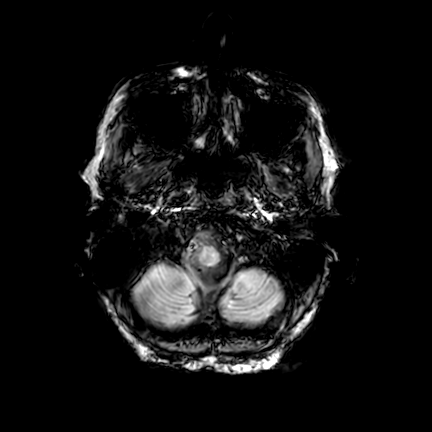
[im 20/100]
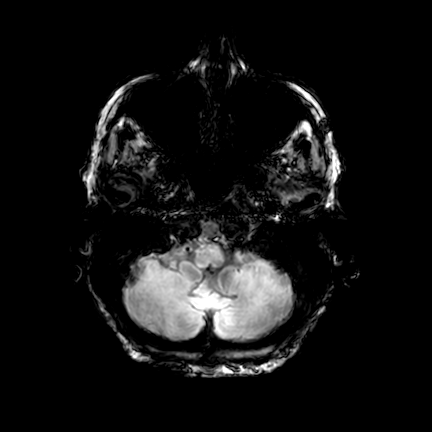
[im 34/100]
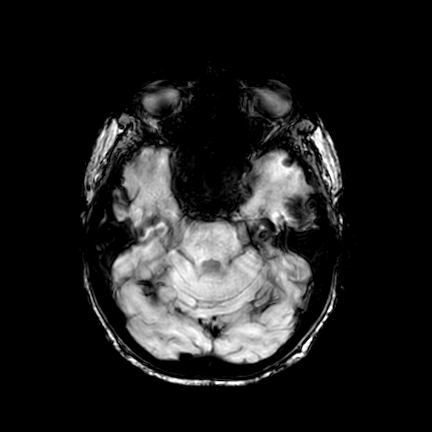
[im 47/100]
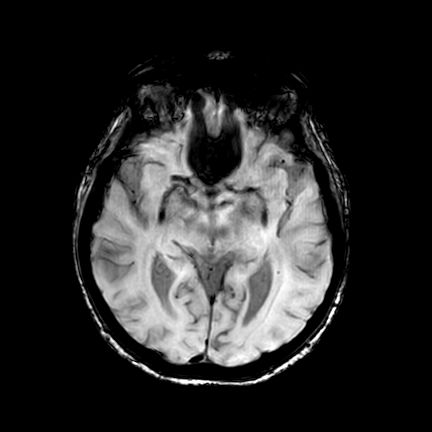
[im 53/100]
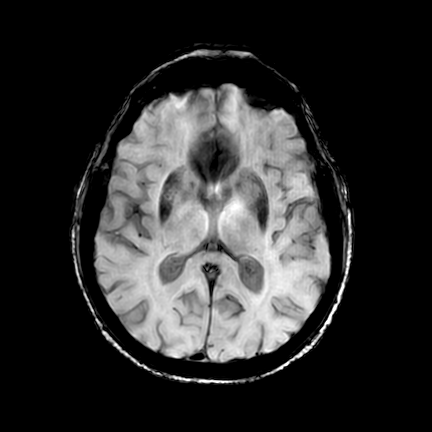
[im 60/100]
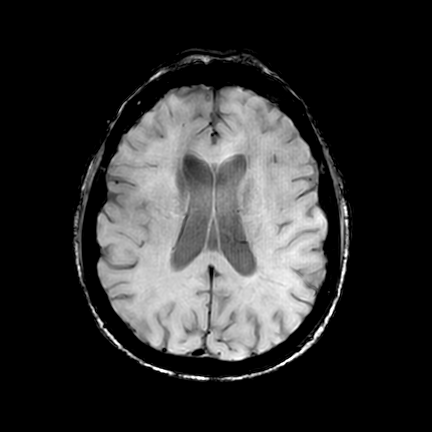
[im 73/100]
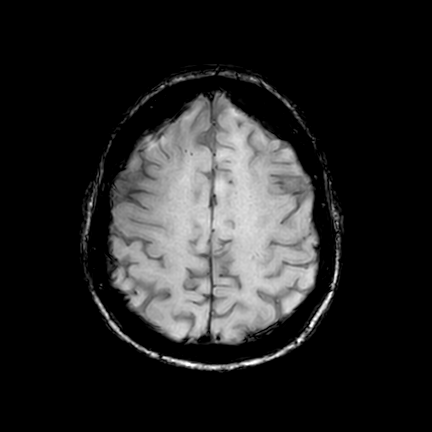
[im 86/100]
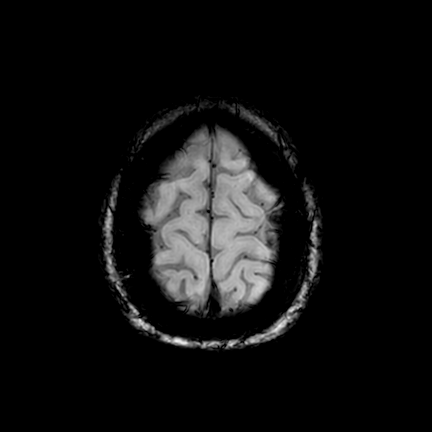
[im 100/100]
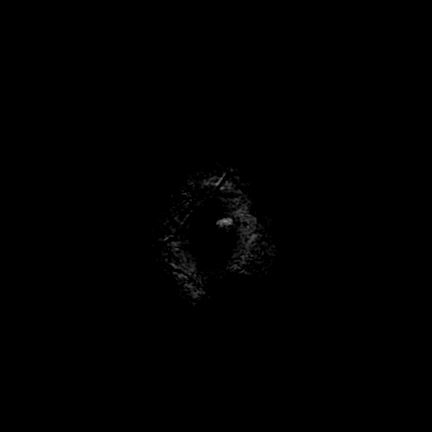

[Series 701: T2 · axial · 5.0mm · 0.43mm/px · z∈[-58,+98]mm · 4 of 27 slices shown (1 of 2)]
[im 1/27]
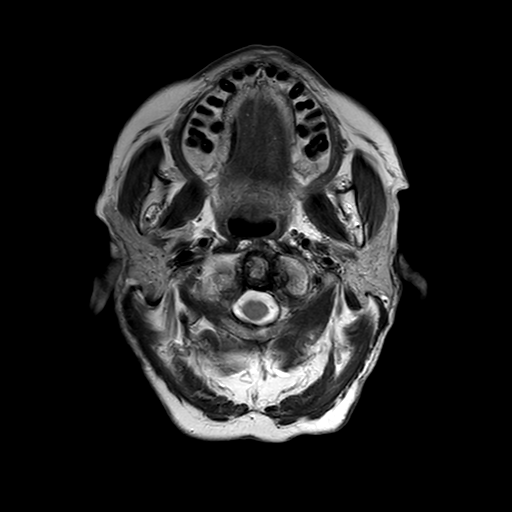
[im 9/27]
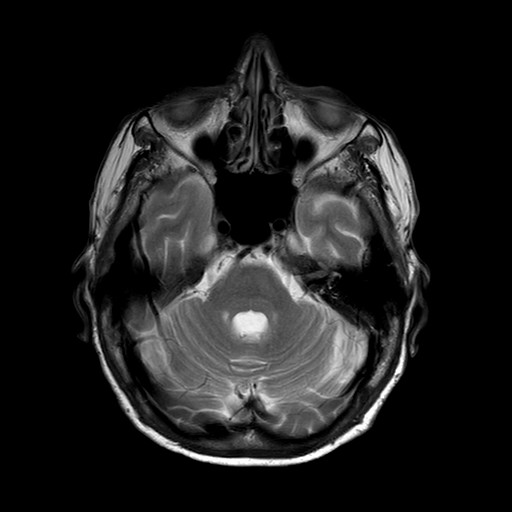
[im 18/27]
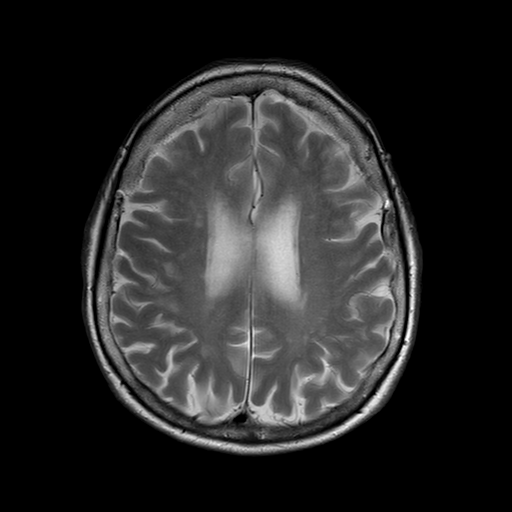
[im 27/27]
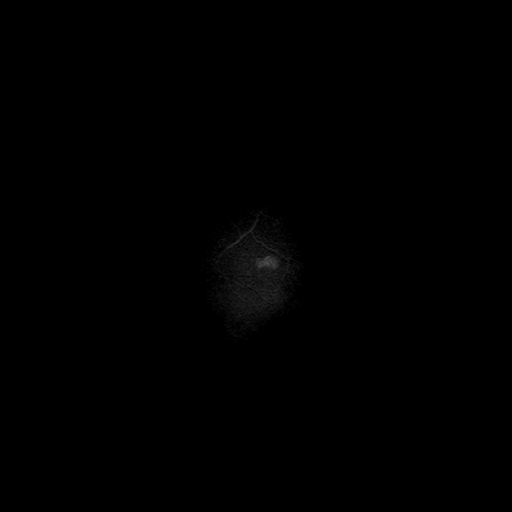

[Series 801: T2 · coronal · 4.0mm · 0.47mm/px · 5 of 34 slices shown (2 of 2)]
[im 1/34]
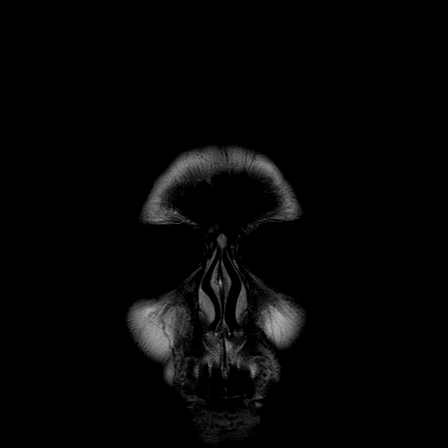
[im 9/34]
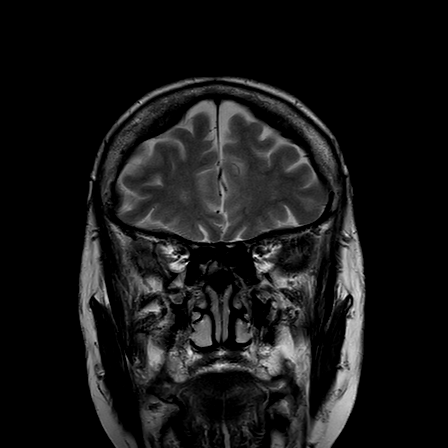
[im 17/34]
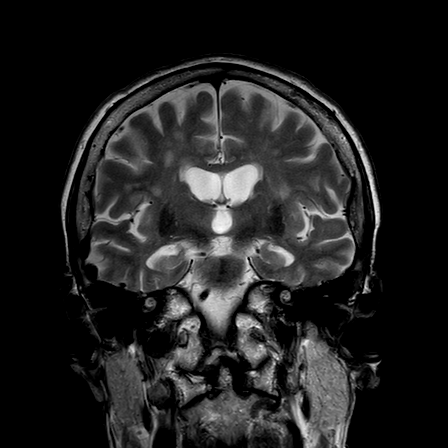
[im 25/34]
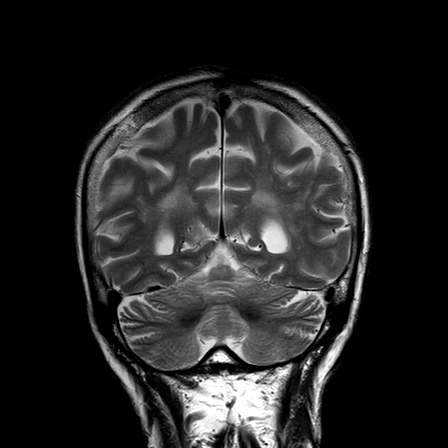
[im 34/34]
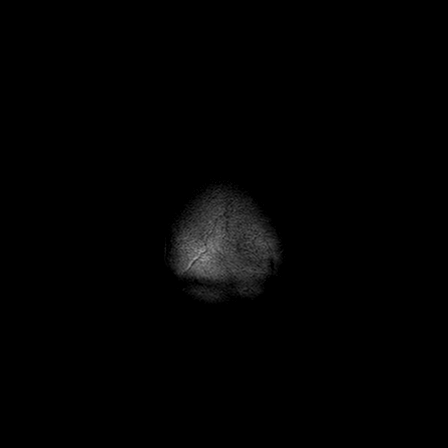

[27 of 48 positions shown; findings below may reference images not displayed]

FINDINGS: There is mild to moderate cortical atrophy, most pronounced along the 
anterolateral frontal convexities. There is mild to moderate bilateral 
hippocampal volume loss. There is no hydrocephalus. Diffusion images are 
negative. There are extensive T2 hyperintense signal changes in the cerebral 
white matter, with mild pontine involvement, nonspecific although likely chronic 
microangiopathy. Susceptibility images show a small cluster of hemosiderin in 
the right frontal centrum semiovale, where there is mild T2 hyperintense signal. 
Review of the September 2016 CT shows no evidence for calcification in this region. 
This likely indicates a small venous anomaly. Sequelae of prior white matter 
shear hemorrhage or post inflammatory involvement cannot be excluded. There is 
no mass effect. This finding is most likely chronic. 
Major intracranial arterial segments are open. Major dural sinuses are open. 
There has been ocular lens implantation. Paranasal sinuses and otomastoid spaces 
are clear. The craniocervical junction is open. There are atlantoaxial 
degenerative changes. Sellar contents normal for age. Significant.
IMPRESSION: Mild to moderate atrophy, including mild to moderate hippocampal volume loss. 
The pattern of involvement is nonspecific. Brain PET would be useful if 
indicated. 
Extensive cerebral white matter T2 hyperintense signal changes, nonspecific 
although most likely due to chronic small vessel change. There is no 
intracranial mass, recent infarct or hydrocephalus. 
Small clustered foci of hemosiderin in the right anterior frontal white matter , 
most likely chronic.

## 2020-02-04 ENCOUNTER — Encounter

## 2020-02-05 LAB — CREATININE
Creatinine: 1.3 mg/dL — ABNORMAL HIGH (ref 0.6–1.2)
GFR African American: 48 — AB (ref >60)
GFR Non-African American: 39 — AB (ref >60)

## 2020-02-05 LAB — AST: AST: 17 U/L (ref 15–37)

## 2020-02-05 LAB — ALT: ALT: 10 U/L (ref 10–40)

## 2020-02-09 NOTE — Telephone Encounter (Signed)
Same place she got the first ones  The pharmacy

## 2020-02-09 NOTE — Telephone Encounter (Signed)
No answer no VM will attempt call later

## 2020-02-09 NOTE — Telephone Encounter (Signed)
Patient tested positive for covid 01-05-20, she did have both Pfizer vaccines, she is asking when can/should get the booster.      Please call, [phone]

## 2020-02-11 NOTE — Telephone Encounter (Signed)
Patient notified by VM to have 3rd vaccine after 03/06/20

## 2020-03-01 ENCOUNTER — Encounter

## 2020-03-01 MED ORDER — GABAPENTIN 300 MG PO CAPS
300 MG | ORAL_CAPSULE | ORAL | 1 refills | Status: DC
Start: 2020-03-01 — End: 2020-07-04

## 2020-03-01 NOTE — Telephone Encounter (Signed)
Last visit 09/22/19  Labs 02/04/20  Next visit 04/04/20

## 2020-03-30 ENCOUNTER — Encounter

## 2020-03-31 ENCOUNTER — Encounter

## 2020-03-31 LAB — AST: AST: 16 U/L (ref 15–37)

## 2020-03-31 LAB — CREATININE
Creatinine: 1.3 mg/dL — ABNORMAL HIGH (ref 0.6–1.2)
GFR African American: 48 — AB (ref >60)
GFR Non-African American: 39 — AB (ref >60)

## 2020-03-31 LAB — ALT: ALT: 12 U/L (ref 10–40)

## 2020-03-31 MED ORDER — PREDNISONE 5 MG PO TABS
5 MG | ORAL_TABLET | Freq: Every day | ORAL | 2 refills | Status: DC
Start: 2020-03-31 — End: 2020-06-29

## 2020-03-31 NOTE — Telephone Encounter (Signed)
Last visit 09/22/19  Labs 03/30/20  Next visit 04/04/20

## 2020-04-04 ENCOUNTER — Ambulatory Visit: Admit: 2020-04-04 | Discharge: 2020-04-04 | Payer: MEDICARE | Attending: Internal Medicine | Primary: Family Medicine

## 2020-04-04 DIAGNOSIS — M1990 Unspecified osteoarthritis, unspecified site: Secondary | ICD-10-CM

## 2020-04-04 MED ORDER — DULOXETINE HCL 60 MG PO CPEP
60 MG | ORAL_CAPSULE | Freq: Every day | ORAL | 5 refills | Status: DC
Start: 2020-04-04 — End: 2020-07-04

## 2020-04-04 NOTE — Progress Notes (Signed)
04/04/2020     Patient Name: Jodi Walls  DOB: 1940/10/30  MEDICAL RECORD [id number]    MEDICATIONS  Current Outpatient Medications   Medication Sig Dispense Refill   . dicyclomine (BENTYL) 20 MG tablet Take 20 mg by mouth 3 times daily as needed     . ondansetron (ZOFRAN-ODT) 4 MG disintegrating tablet TAKE 1 TABLET BY MOUTH EVERY 8 HOURS AS NEEDED     . DULoxetine (CYMBALTA) 60 MG extended release capsule Take 1 capsule by mouth daily 30 capsule 5   . predniSONE (DELTASONE) 5 MG tablet TAKE 1 TABLET BY MOUTH DAILY 30 tablet 2   . gabapentin (NEURONTIN) 300 MG capsule TAKE 2 CAPSULES BY MOUTH DAILY WITH SUPPER 180 capsule 1   . levothyroxine (SYNTHROID) 75 MCG tablet TAKE 1 TABLET BY MOUTH DAILY 90 tablet 1   . leflunomide (ARAVA) 20 MG tablet TAKE 1 TABLET BY MOUTH DAILY 30 tablet 2   . hydroxychloroquine (PLAQUENIL) 200 MG tablet TAKE 1 TABLET BY MOUTH DAILY 30 tablet 5   . diclofenac sodium (VOLTAREN) 1 % GEL APPLY 4 GRAMS TO LOWER EXTREMITY JOINTS AND 2 GRAMS TO UPPER EXTREMITY JOINTS FOUR TIMES DAILY AS NEEDED 500 g 1   . Cyanocobalamin (B-12 PO) Take by mouth     . furosemide (LASIX) 40 MG tablet Take 1 tablet by mouth daily 30 tablet 2   . vitamin D (CHOLECALCIFEROL) 25 MCG (1000 UT) TABS tablet Take 1 tablet by mouth daily     . potassium chloride (KLOR-CON) 10 MEQ extended release tablet Take 10 mEq by mouth 2 times daily     . melatonin 3 MG TABS tablet Take 3 mg by mouth nightly      . aspirin 81 MG tablet Take 81 mg by mouth daily.       No current facility-administered medications for this visit.       ALLERGIES  Allergies   Allergen Reactions   . Pcn [Penicillins] Swelling     Face and hands swelled   . Triamterene      Severe stomach pain,SOB,severe cramps throughout her body   . Ace Inhibitors Other (See Comments)     cough   . Atenolol Other (See Comments)     Bradycardia (HR 40's)   . Atorvastatin Other (See Comments)     Muscle aches   . Zithromax [Azithromycin] Swelling   . Codeine Nausea And  Vomiting         Comments  No specialty comments available.    Background history:  [name] is a 79 y.o. female breast cancer status post lumpectomy, hypertension, osteoarthritis, hypothyroidism, CKD 3 who is being seen for follow up evaluation of  joint pain.  She is complaining of pain in her hands and feet.  We started 2 years ago.  Symptoms have progressively worsened over the past 1 year.  She has pain on daily basis, 10 out of 10.  She is swelling in her knuckles and feet.  She has morning stiffness lasting for 1 hour.  She has difficulty opening doorknobs and jars.  She has developed weakness in her hands and is dropping things.  She takes Tylenol 1000 mg twice a day with minimal relief.  She also takes Aleve as needed.  She is unable to take oral NSAIDs due to CKD 3.  She has a history of left knee replacement.  She has osteoarthritis in the right knee as well.  Right knee cracks and pops.  Pain in the right knee gets worse going up and down the steps and standing from the sitting position.  She had a trauma to the left hand in October 2019.  Since then left third MCP joint stays swollen and painful.  She had x-ray of the left hand which did not show any fracture.  She denies psoriasis, inflammatory bowel disease, inflammatory back pain, dactylitis, enthesitis, tenosynovitis or uveitis.  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  Her mother had arthritis  She had work-up with PCP which showed negative RF, ESR and normal TSH.    Interim history: She presents for follow-up of osteoarthritis, CPPD.  She is a little understanding of the medications she is taking.  She was last seen in May 2021.  She was placed on leflunomide 20 mg daily and prednisone 5 mg daily.  She is also on Plaquenil 200 mg daily.  She reports minimal improvement with these medications.  Main symptoms are tingling and numbness in her hands and feet.  She has peripheral neuropathy.  She is  takes 600 mg at bedtime and Cymbalta 30 mg daily.    She has vitamin B1 and vitamin B6 deficiency.  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  Work-up came back negative for rheumatoid arthritis.  Hand and foot x-rays showed moderate to severe degenerative changes with CPPD.  She denies any pseudogout flareups.  Work-up negative for secondary causes of pseudogout.  She denies any side effects with medications.  She denies any recent fevers or infections.Marland Kitchen  HPI  Review of Systems    REVIEW OF SYSTEMS:   Constitutional: No unanticipated weight loss or fevers.   Integumentary: No rash, photosensitivity, malar rash, livedo reticularis, alopecia and Raynaud's symptoms, sclerodactyly, skin tightening  Eyes: negative for visual disturbance and persistent redness, discharge from eyes   ENT: - No tinnitus, loss of hearing, vertigo, or recurrent ear infections.  - No history of nasal/oral ulcers.  - No history of dry eyes/dry mouth  Cardiovascular: No history of pericarditis, chest pain or murmur or palpitations  Respiratory: No shortness of breath, cough or history of interstitial lung disease.No history of pleurisy. No history of tuberculosis or atypical infections.  Gastrointestinal: No history of heart burn, dysphagia or esophageal dysmotility. No change in bowel habits or any inflammatory bowel disease.  Genitourinary: No history miscarriages.  Hematologic/Lymphatic: No abnormal bruising or bleeding, blood clots or swollen lymph nodes.  Neurological: No history of headaches, seizure or focal weakness. No history of neuropathies, paresthesias or hyperesthesias, facial droop, diplopia  Psychiatric: No history of bipolar disease, anxiety, depression  Endocrine: Denies any polyuria, polydipsia and osteoporosis  Allergic/Immunologic: No nasal congestion or hives.        I have reviewed patients Past medical History, Social History and Family History as mentioned in her chart and  this remains unchanged fromprevious.    Past Medical History:   Diagnosis Date   . Anemia    . Arthritis    . Cancer ([place]) 1995    hx of breast cancer--no chemo or radiation needed   . GERD (gastroesophageal reflux disease)    . Hx of blood clots 2001    after gallbladder surgery--blood clot in arm where IV had been   . Hypothyroidism    . Kidney failure     stage 3   . PONV (postoperative nausea and vomiting)    . Wears glasses  reading     Past Surgical History:   Procedure Laterality Date   . APPENDECTOMY     . ARTHROPLASTY Left 12/25/2018    LEFT MIDDLE FINGER METACARPO-PHALANGEAL JOINT SYNOVECTOMY, LIGAMENT REBALANCING, EXTENSOR TENDON CENTRALIZATION AND SILICONE METACARPOPHALANGEAL JOINT ARTHROPLASTY performed by Delene Loll, MD at [place]   . BACK SURGERY      r/t to herniated disc in lower back, neck area--screws and rods in place   . BREAST BIOPSY      left breast biopsy   . BREAST SURGERY  1995    right mastectomy   . CHOLECYSTECTOMY  2001   . COLONOSCOPY  04/12/2015    Esophagogastroduodenoscopy with biopsy, Colonoscopy, diagnostic. no findings except for diverticula and check in 10 years   . HYSTERECTOMY      TAH-BSO. no  cancer   . JOINT REPLACEMENT Left     left total knee replacement   . LIPOMA RESECTION      right side below right breast   . MASTECTOMY, RADICAL  1995   . UPPER GASTROINTESTINAL ENDOSCOPY  04/12/2015    and colonoscopy. hiatal hernia and gastritis     Social History     Socioeconomic History   . Marital status: Widowed     Spouse name: Not on file   . Number of children: Not on file   . Years of education: Not on file   . Highest education level: Not on file   Occupational History   . Not on file   Tobacco Use   . Smoking status: Never Smoker   . Smokeless tobacco: Never Used   Vaping Use   . Vaping Use: Never used   Substance and Sexual Activity   . Alcohol use: Yes     Comment: social-rare. once a year   . Drug use: No   . Sexual activity: Not Currently   Other Topics Concern    . Not on file   Social History Narrative   . Not on file     Social Determinants of Health     Financial Resource Strain: Low Risk    . Difficulty of Paying Living Expenses: Not hard at all   Food Insecurity: No Food Insecurity   . Worried About Charity fundraiser in the Last Year: Never true   . Ran Out of Food in the Last Year: Never true   Transportation Needs:    . Lack of Transportation (Medical): Not on file   . Lack of Transportation (Non-Medical): Not on file   Physical Activity:    . Days of Exercise per Week: Not on file   . Minutes of Exercise per Session: Not on file   Stress:    . Feeling of Stress : Not on file   Social Connections:    . Frequency of Communication with Friends and Family: Not on file   . Frequency of Social Gatherings with Friends and Family: Not on file   . Attends Religious Services: Not on file   . Active Member of Clubs or Organizations: Not on file   . Attends Archivist Meetings: Not on file   . Marital Status: Not on file   Intimate Partner Violence:    . Fear of Current or Ex-Partner: Not on file   . Emotionally Abused: Not on file   . Physically Abused: Not on file   . Sexually Abused: Not on file   Housing Stability:    . Unable  to Pay for Housing in the Last Year: Not on file   . Number of Places Lived in the Last Year: Not on file   . Unstable Housing in the Last Year: Not on file     Family History   Problem Relation Age of Onset   . Cancer Mother         lung    . Cancer Father         bladder   . Heart Disease Father    . High Blood Pressure Father    . Cancer Maternal Aunt         ovarian   . Cancer Paternal Cousin         ovarian       Vitals:    04/04/20 1337   BP: (!) 140/90   Pulse: 76   Weight: 186 lb 9.6 oz (84.6 kg)     Physical Exam  Constitutional:  Well developed, well nourished, no acute distress, non-toxic appearance   Musculoskeletal:    RIGHT  Swell  Tender  ROM  LEFT  Swell  Tender  ROM    DIP2  0  0   [place]  0  0   [place]   DIP3  0  0    [place]  0  0   [place]   DIP4  0  0   [place]  0  0   [place]   DIP5  0  0   [place]  0  0   [place]   PIP1  0  0   bony change  0  0   bony change   PIP2  0  0  FULL   0  0  FULL    PIP3  0  0  FULL   0  0  FULL    PIP4  0  0  FULL   0  0  FULL    PIP5  0  0  FULL   0  0  FULL    MCP1  0  0   bony change  0  0   bony change   MCP2  0  0   bony change  0  0   bony change   MCP3  0  0   bony change  0  0   bony change+++   MCP4  0  0  FULL   0  0  FULL    MCP5  0  0  FULL   0  0  FULL    Wrist  0  0  FULL   0  0  FULL    Elbow  0  0  FULL   0  0  FULL    Shouldr  0  0  FULL   0  0  FULL    Hip  0  0  FULL   0  0  FULL    Knee  0  0  CREPITUS/VARUS   0  0  TKR   Ankle  0  0  FULL   0  0  FULL    MTP1  0  + FULL   0  + FULL    MTP2  0  + FULL   0  +  FULL    MTP3  0  + FULL   0  +  FULL    MTP4  0  + FULL   0  +  FULL    MTP5  0  + FULL   0  +  FULL    IP1  0  0  FULL   0  0  FULL    IP2  0  0  FULL   0  0  FULL    IP3  0  0  FULL   0  0  FULL    IP4  0  0  FULL   0  0  FULL    IP5  0  0  FULL   0 0 FULL     Squaring and bony enlargement of bilateral CMC joints  Trace soft tissue swelling in bilateral PIP joints  Ambulates without assistance, normal gait  Neck: Full ROM, no tenderness,supple   Back-  Tenderness++  Eyes:  PERRL, extra ocular movements intact, conjunctiva normal   HEENT:  Atraumatic, normocephalic, external ears normal, oropharynx moist, no pharyngeal exudates.   Respiratory:  No respiratory distress  GI:  Soft, nondistended, normal bowel sounds, nontender, noorganomegaly, no mass, no rebound, no guarding   GU:  No costovertebral angle tenderness   Integument:  Well hydrated, no rash or telangiectasias  Lymphatic:  No lymphadenopathy noted   Neurologic:   Alert & oriented x 3, CN 2-12 normal, no focal deficits noted. Sensations Intact. Muscle strength 5/5 proximallyand distally in upper and lower extremities.   Psychiatric:  Speech and behavior appropriate           LABS AND IMAGING  Outside data  reviewed and in HPI    Lab Results   Component Value Date    WBC 6.7 10/22/2019    RBC 4.15 10/22/2019    RBC 4.72 10/26/2015    HGB 11.3 10/22/2019    HCT 34.7 10/22/2019    PLT 241 10/22/2019    MCV 83.8 10/22/2019    MCH 27.3 10/22/2019    MCHC 32.6 10/22/2019    RDW 14.8 10/22/2019    SEGSPCT 60.4 02/02/2010    LYMPHOPCT 14.0 10/22/2019    LYMPHOPCT 24.7 10/26/2015    MONOPCT 6.1 10/22/2019    EOSPCT 4.3 02/02/2010    BASOPCT 0.9 10/22/2019    MONOSABS 0.4 10/22/2019    LYMPHSABS 0.9 10/22/2019    EOSABS 0.1 10/22/2019    BASOSABS 0.1 10/22/2019    DIFFTYPE Auto 02/02/2010       Chemistry        Component Value Date/Time    NA 143 10/22/2019 1211    K 4.0 10/22/2019 1211    CL 103 10/22/2019 1211    CO2 24 10/22/2019 1211    BUN 29 (H) 10/22/2019 1211    CREATININE 1.3 (H) 03/30/2020 1248        Component Value Date/Time    CALCIUM 9.6 10/22/2019 1211    ALKPHOS 108 06/24/2019 0933    AST 16 03/30/2020 1248    ALT 12 03/30/2020 1248    BILITOT 0.5 06/24/2019 0933          Lab Results   Component Value Date    SEDRATE 24 06/24/2019     Lab Results   Component Value Date    CRP <3.0 06/24/2019     Lab Results   Component Value Date    ANA Negative 11/12/2018     Lab Results   Component Value Date    RF <10.0 10/24/2018     Lab Results   Component Value Date    ANA Negative 11/12/2018  No results found for: DSDNAG, DSDNAIGGIFA  No results found for: SSAROAB, SSALAAB  No results found for: SMAB, RNPAB  No results found for: CENTABIGG  No results found for: C3, C4, ACE  Lab Results   Component Value Date    VITD25 36.9 11/12/2018     No results found for: [employer]ayer [employer]m  Lab Results   Component Value Date    VITAMINB12 1860 (H) 06/19/2019     Lab Results   Component Value Date    TSH 2.05 10/22/2019     Lab Results   Component Value Date    VITD25 36.9 11/12/2018       Hand and foot x-rays 11/11/2018  FINDINGS:    Right hand:        Osseous alignment is normal.        Severe degenerative changes of the 1st CMC  joint. Mild degenerative changes    of the DIP joints and 1st IP joint. Chondrocalcinosis of the TFCC.        No marginal erosions are identified. No acute fracture or gross dislocation    is seen.        No focal soft tissue swelling is evident.        Left hand:        Osseous alignment is normal.        Mild degenerative changes of the DIP joints, 1st MCP joint and triscaphe    joint. Mild to moderate degenerative change of the 1st [facility] joint.    Chondrocalcinosis of the TFCC.        No marginal erosions are identified. No acute fracture or gross dislocation    is seen.        No focal soft tissue swelling is evident.        Right foot:        Osseous alignment is normal. Mild to moderate degenerative changes of the    TMT joints primarily at the 1st TMT joint.        Moderate plantar calcaneal spur. Soft tissue swelling at the right forefoot.    Calcification along the plantar fascia. No marginal erosions are identified.    No acute fracture or gross dislocation is seen.        The medial and middle cuneiforms demonstrate proper alignment with the base    of the 1st and 2nd metatarsals respectively.        No tibiotalar joint effusion is seen.        Boehler's angle is maintained.        Left foot:        Osseous alignment is normal.        Moderate degenerative changes of the midfoot and tarsal metatarsal joints.    Remote healed fracture deformities of the 2nd 3rd metatarsal diaphyses. Mild    plantar calcaneal spur. Calcification of the plantar fascia. Os trigonum    variant. Soft tissue swelling of the left forefoot. No marginal erosions    are identified. No acute fracture or gross dislocation is seen.        The medial and middle cuneiforms demonstrate proper alignment with the base    of the 1st and 2nd metatarsals respectively.        No tibiotalar joint effusion is seen.        Boehler's angle is maintained.            Impression    Right hand:  1. Osteoarthrosis  which is severe at the 1st [facility] joint. CPPD.    2. No acute fracture or dislocation.    Left hand:        1. Mild to moderate osteoarthrosis. CPPD.    2. No acute fracture or dislocation.    Right foot:        1. Mild-to-moderate degenerative changes as detailed above. Moderate plantar    calcaneal spur.    2. Soft tissue swelling of the right forefoot.    3. No acute fracture or dislocation.    Left foot:        1. Moderate diffuse degenerative changes as detailed above.    2. Remote healed fracture deformities of the 2nd and 3rd metatarsal diaphyses.    3. Mild plantar calcaneal spur.    4. Soft tissue swelling of the forefoot.    5. No acute fracture or dislocation.        MRI left hand 11/15/2018  1. Mild volar subluxation of the 3rd metacarpophalangeal joint with mild    degenerative changes and a small effusion.    2. Severe 1st carpometacarpal degenerative changes. Moderate 1st    interphalangeal degenerative changes       ASSESSMENT AND PLAN      Assessment/Plan:      ASSESSMENT:    1. Inflammatory osteoarthritis    2. Peripheral polyneuropathy    3. High risk medication use    4. Primary osteoarthritis involving multiple joints    5. H/O calcium pyrophosphate deposition disease (CPPD)        PLAN:   1.  Inflammatory osteoarthritis  Osteoarthritis/ Joint pain : Discussed about diagnosis and management of osteoarthritis. There are no FDA approved disease modifying agents. Goal is to control pain and increase mobility. Discussed the importance of wt loss, exercises, formal PT, joint braces/splints. First line of agent Tylenol arthritis, NSAIDs systemic and/ or topical,Cymbalta, pain meds, joint injections- steroids, and visco supplementaiton for knee OA. Also discussed about surgical options.  Continues to be symptomatic.  Most likely inflammatory osteoarthritis with peripheral neuropathy.  -RF, CCP negative, normal ESR and CRP.  Hand and foot x-rays with moderate to severe degenerative arthritis with  CPPD  Unable to take oral NSAIDs due to CKD  Advised to take Tylenol 1000 mg 3 times a day, do not exceed 3 g daily  Continue diclofenac gel  Continue Plaquenil 200 mg daily  Continue leflunomide 20 mg daily and prednisone 5 mg daily      2.  Peripheral neuropathy  Blood work showed vitamin B1 and vitamin B6 deficiency.  She was placed on replacement.  Magnesium, B12, TSH, copper SPEP, hemoglobin A1c normal.    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  Sees neurologist  Increase Cymbalta to 60 mg daily and continue gabapentin 600 mg at bedtime         3. H/O calcium pyrophosphate deposition disease (CPPD)  Work-up negative for secondary causes     Diagnosis Orders   1. Inflammatory osteoarthritis     2. Peripheral polyneuropathy  DULoxetine (CYMBALTA) 60 MG extended release capsule   3. High risk medication use     4. Primary osteoarthritis involving multiple joints     5. H/O calcium pyrophosphate deposition disease (CPPD)       Recommend once yearly flu vaccine, pneumonia and shingles vaccine    The patient indicates understanding of these issues and agrees with the plan.      Return in  about 3 months (around 07/03/2020).      The risks and benefits of my recommendations, as well as other treatment options, benefits and side effects werediscussed with the patient. All questions were answered.       ######################################################################    I thank you for giving me theopportunity to participate in [name] care. If you have any questions or concerns please feel free to contact me. I look forward to following  [place] along with you.      Electronically signed by: Oletta Lamas, MD, MD, 04/04/2020 2:13 PM    Documentation was done using voice recognition dragon software.  Every effort was made to ensure accuracy;however, inadvertent unintentional computerized transcription errors may be present.

## 2020-04-04 NOTE — Patient Instructions (Signed)
Increase cymbalta to 60 mg daily   continue plaquenil, prednisone and arava

## 2020-04-14 ENCOUNTER — Inpatient Hospital Stay: Admit: 2020-04-14 | Payer: MEDICARE | Primary: Family Medicine

## 2020-04-14 ENCOUNTER — Inpatient Hospital Stay: Payer: MEDICARE | Primary: Family Medicine

## 2020-04-14 ENCOUNTER — Ambulatory Visit: Admit: 2020-04-14 | Discharge: 2020-04-14 | Payer: MEDICARE | Attending: Family Medicine | Primary: Family Medicine

## 2020-04-14 DIAGNOSIS — R0781 Pleurodynia: Secondary | ICD-10-CM

## 2020-04-14 DIAGNOSIS — I10 Essential (primary) hypertension: Secondary | ICD-10-CM

## 2020-04-14 NOTE — Patient Instructions (Signed)
Continue the diet  Stay with the current medicines  Do remember to get the mammogram done  See [name] in 6 months

## 2020-04-14 NOTE — Progress Notes (Signed)
 Subjective:      Patient ID: Jodi Walls is a 79 y.o. female.    Chief Complaint   Patient presents with   . 6 Month Follow-Up     hypertension, thyroid, lipids - pt is fasting        Patient presents with:  6 Month Follow-Up: hypertension, thyroid, lipids - pt is fasting    Here for the above    overall well   But some soreness on the left anterior costal margin for many months no trauma  Hurts to touch or push no rash     Date of Birth:  Sep 20, 1940    Date of Visit:  04/14/2020     -- Pcn (Penicillins) -- Swelling    --  Face and hands swelled   -- Triamterene      --  Severe stomach pain,SOB,severe cramps throughout             her body   -- Ace Inhibitors -- Other (See Comments)    --  cough   -- Atenolol  -- Other (See Comments)    --  Bradycardia (HR 40's)   -- Atorvastatin -- Other (See Comments)    --  Muscle aches   -- Zithromax (Azithromycin) -- Swelling   -- Codeine -- Nausea And Vomiting    Current Outpatient Medications:  predniSONE  (DELTASONE ) 5 MG tablet, TAKE 1 TABLET BY MOUTH DAILY, Disp: 30 tablet, Rfl: 2  gabapentin  (NEURONTIN ) 300 MG capsule, TAKE 2 CAPSULES BY MOUTH DAILY WITH SUPPER, Disp: 180 capsule, Rfl: 1  levothyroxine  (SYNTHROID ) 75 MCG tablet, TAKE 1 TABLET BY MOUTH DAILY, Disp: 90 tablet, Rfl: 1  leflunomide  (ARAVA ) 20 MG tablet, TAKE 1 TABLET BY MOUTH DAILY, Disp: 30 tablet, Rfl: 2  hydroxychloroquine  (PLAQUENIL ) 200 MG tablet, TAKE 1 TABLET BY MOUTH DAILY, Disp: 30 tablet, Rfl: 5  diclofenac  sodium (VOLTAREN ) 1 % GEL, APPLY 4 GRAMS TO LOWER EXTREMITY JOINTS AND 2 GRAMS TO UPPER EXTREMITY JOINTS FOUR TIMES DAILY AS NEEDED, Disp: 500 g, Rfl: 1  Cyanocobalamin  (B-12 PO), Take by mouth, Disp: , Rfl:   furosemide  (LASIX ) 40 MG tablet, Take 1 tablet by mouth daily, Disp: 30 tablet, Rfl: 2  vitamin D (CHOLECALCIFEROL) 25 MCG (1000 UT) TABS tablet, Take 1 tablet by mouth daily, Disp: , Rfl:   potassium chloride  (KLOR-CON ) 10 MEQ extended release tablet, Take 10 mEq by mouth 2 times daily,  Disp: , Rfl:   melatonin 3 MG TABS tablet, Take 3 mg by mouth nightly , Disp: , Rfl:   aspirin  81 MG tablet, Take 81 mg by mouth daily., Disp: , Rfl:   dicyclomine (BENTYL) 20 MG tablet, Take 20 mg by mouth 3 times daily as needed (Patient not taking: Reported on 04/14/2020), Disp: , Rfl:   ondansetron  (ZOFRAN -ODT) 4 MG disintegrating tablet, TAKE 1 TABLET BY MOUTH EVERY 8 HOURS AS NEEDED (Patient not taking: Reported on 04/14/2020), Disp: , Rfl:   DULoxetine  (CYMBALTA ) 60 MG extended release capsule, Take 1 capsule by mouth daily (Patient not taking: Reported on 04/14/2020), Disp: 30 capsule, Rfl: 5    No current facility-administered medications for this visit.      ----------------------------                04/14/20                       1031         ----------------------------   BP:  122/82         Site:     Left Upper Arm     Position:     Sitting         Cuff Size:   Large Adult       Pulse:          76           Temp:    97 F (36.1 C)     TempSrc:     Temporal        Weight:  185 lb (83.9 kg)    Height: 4' 11.5 (1.511 m)  ----------------------------  Body mass index is 36.74 kg/m.     Wt Readings from Last 3 Encounters:  04/14/20 : 185 lb (83.9 kg)  04/04/20 : 186 lb 9.6 oz (84.6 kg)  10/22/19 : 186 lb (84.4 kg)    BP Readings from Last 3 Encounters:  04/14/20 : 122/82  04/04/20 : (!) 140/90  10/22/19 : 118/82        Review of Systems   Constitutional: Negative for appetite change, chills, fever and unexpected weight change.   Respiratory: Negative for shortness of breath.    Cardiovascular: Negative for chest pain.        She has hx of chest pain and no findings with cardiology    Gastrointestinal: Negative for abdominal pain, blood in stool and constipation.        She notes one of her medicines causes some diarrhea at times   Genitourinary: Negative for difficulty urinating, dysuria and hematuria.   Neurological: Negative for headaches.        Objective:   Physical Exam  Constitutional:       General: She is not in acute distress.     Appearance: Normal appearance. She is well-developed. She is not ill-appearing or diaphoretic.   Neck:      Thyroid: No thyroid mass or thyromegaly.   Cardiovascular:      Rate and Rhythm: Normal rate and regular rhythm.      Heart sounds: Normal heart sounds. No murmur heard.  No friction rub. No gallop.    Pulmonary:      Effort: Pulmonary effort is normal. No tachypnea, accessory muscle usage or respiratory distress.      Breath sounds: Normal breath sounds. No decreased breath sounds, wheezing, rhonchi or rales.   Chest:   Breasts:      Right: No supraclavicular adenopathy.      Left: No supraclavicular adenopathy.       Musculoskeletal:      Cervical back: Neck supple.   Lymphadenopathy:      Cervical: No cervical adenopathy.      Upper Body:      Right upper body: No supraclavicular adenopathy.      Left upper body: No supraclavicular adenopathy.   Skin:     General: Skin is warm and dry.      Coloration: Skin is not pale.   Neurological:      Mental Status: She is alert.         Assessment:        Diagnosis Orders   1. Essential hypertension, benign     2. Acquired hypothyroidism     3. Rib pain on left side  XR RIBS LEFT INCLUDE CHEST (MIN 3 VIEWS)       Stable  No blood work needed at this time    Since last visit  1. Visit with dr  ali on 04/04/20 reviewed for inflammatory arthritis   2. Cardiology visit on 03/01/20 for her sob etc  3. ER visit for the covid symptoms noted on 01/15/20  4. Labs on 03/30/20 liver and creatinine noted. Ok but creatinine at 1.3  5. 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 Bmp and liver ok from blood on 01/15/20 at [facility]       Plan:      Continue the diet  Stay with the current medicines  Do remember to get the mammogram done  See us  in 6 months         Orysia Blas, MD

## 2020-04-18 IMAGING — CT CT ABDOMEN AND PELVIS W/WO CONTRAST
2 of 4 series · 15 of 46 positions shown, 17 images · IV contrast (ISOVUE 300)
Comparison: 10/02/2019

CT ABDOMEN AND PELVIS W/WO CONTRAST, 04/18/2020 [DATE]: 
CLINICAL INDICATION:  Follow-up adrenal nodule. 
A search for DICOM formatted images was conducted for prior CT imaging studies 
completed at a non-affiliated media free facility.
TECHNIQUE: The abdomen and pelvis were scanned from lung bases through the 
pubic rami without and with 100 mL of Isovue 300 contrast injected intravenously 
on a high-resolution CT scanner using dose reduction techniques. Routine MPR 
reconstructions were performed. The patient's eGFR was calculated to be 60 using 
the i-STAT device.

[Series 5: portal with 3.0 i41s 2 · axial · portal-venous · 0.72mm/px · z∈[-478,-112]mm · 12 of 136 slices shown, 14 images]
[im 7/136  soft-tissue]
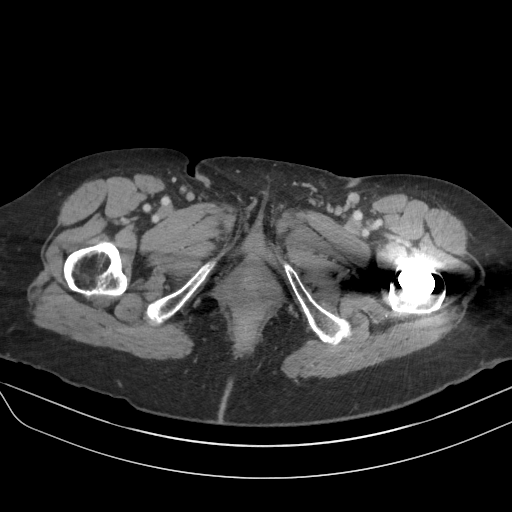
[im 7/136  bone]
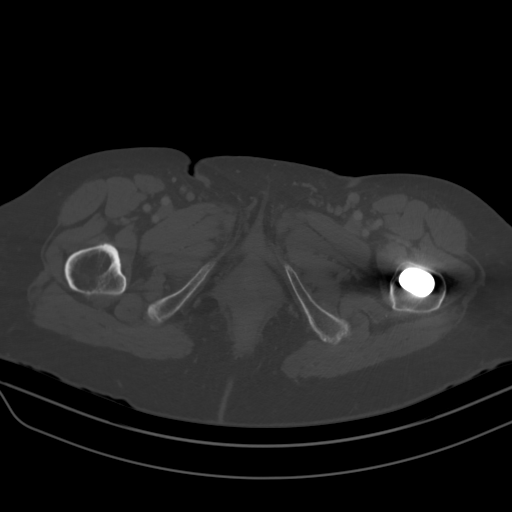
[im 21/136  soft-tissue]
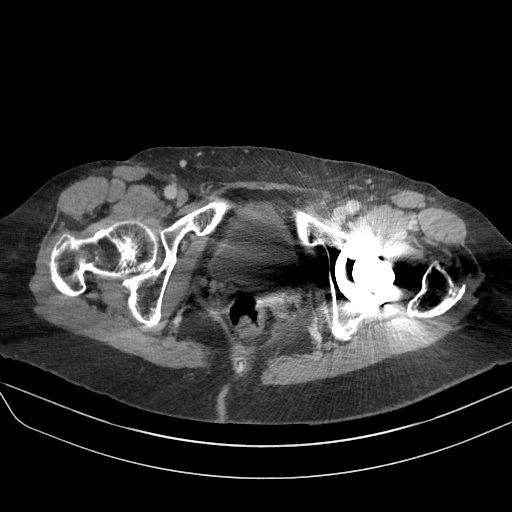
[im 28/136  soft-tissue]
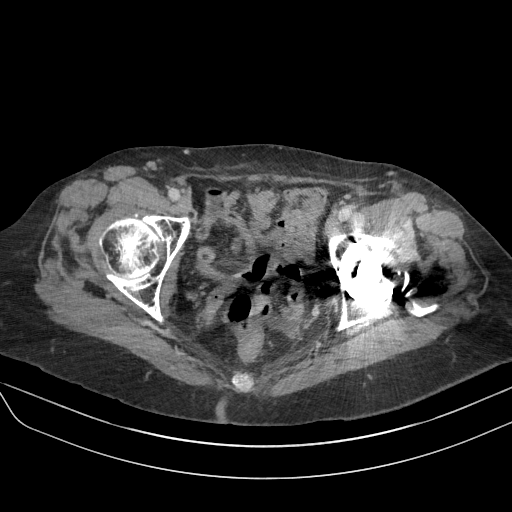
[im 41/136  soft-tissue]
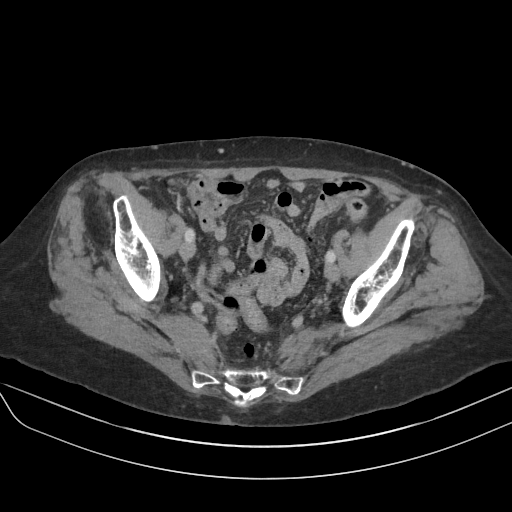
[im 55/136  soft-tissue]
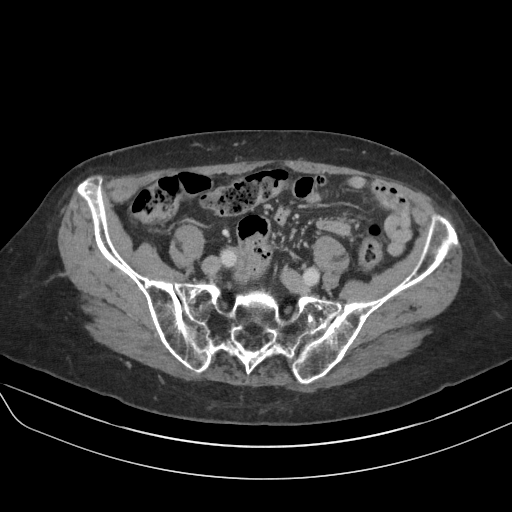
[im 61/136  soft-tissue]
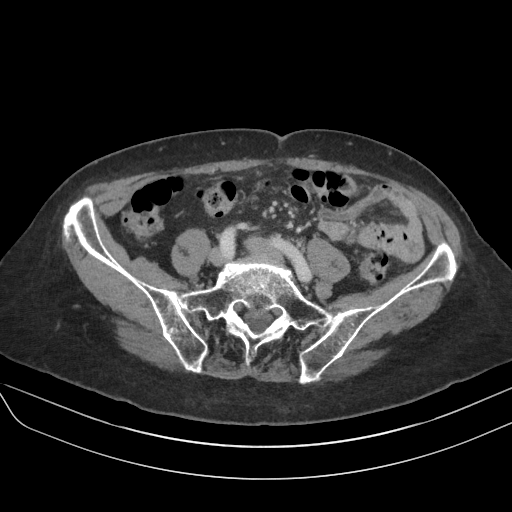
[im 75/136  soft-tissue]
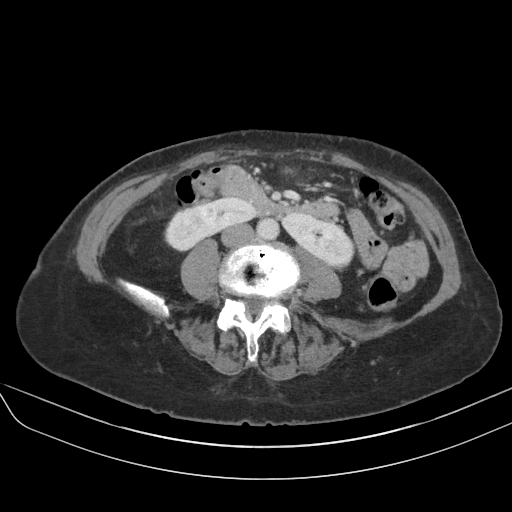
[im 82/136  soft-tissue]
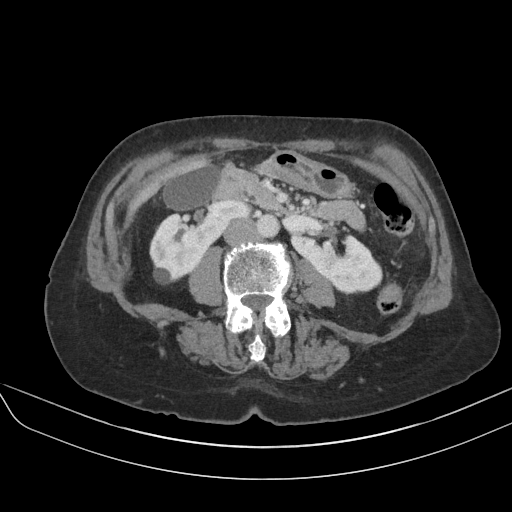
[im 95/136  soft-tissue]
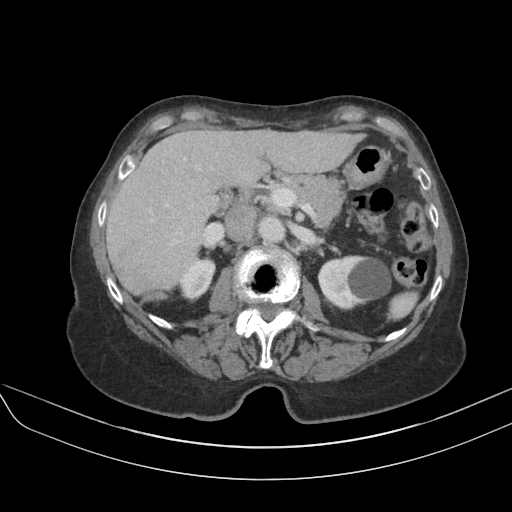
[im 95/136  bone]
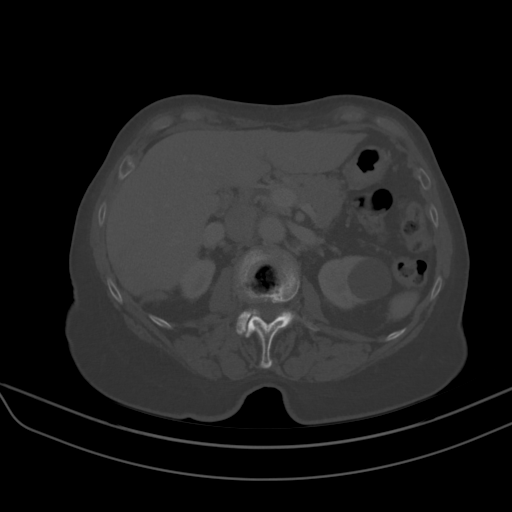
[im 109/136  soft-tissue]
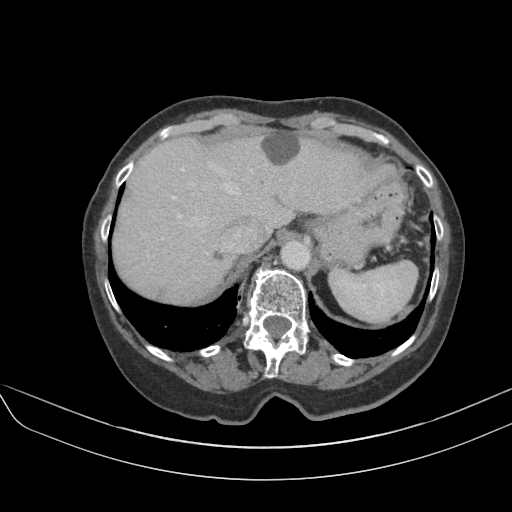
[im 115/136  soft-tissue]
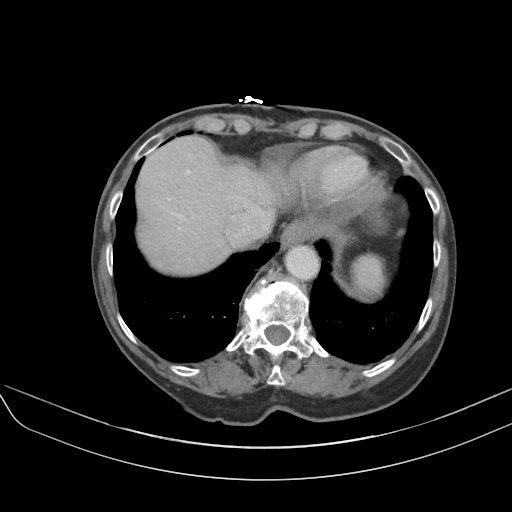
[im 129/136  soft-tissue]
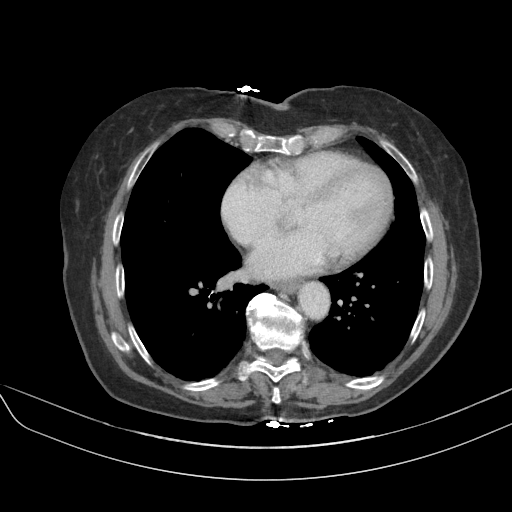

[Series 6: coronal · coronal · 0.62mm/px · 3 of 109 slices shown]
[im 37/109  soft-tissue]
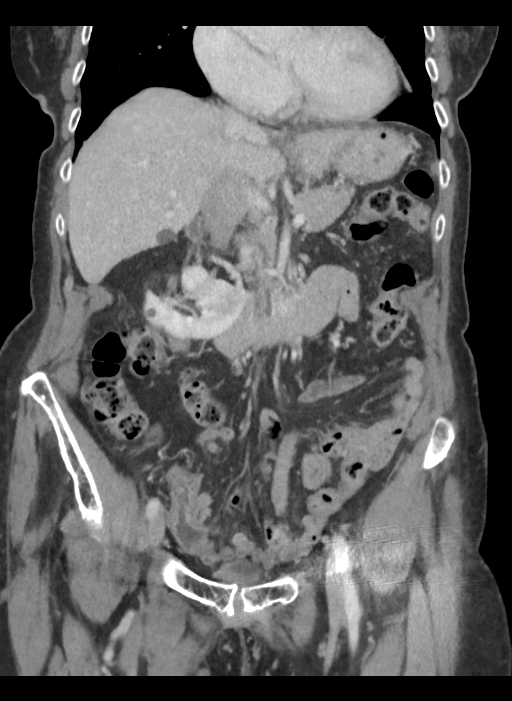
[im 49/109  soft-tissue]
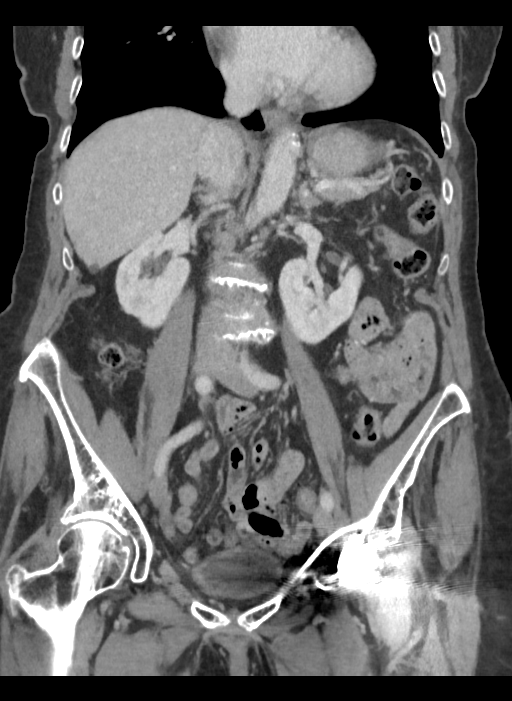
[im 61/109  soft-tissue]
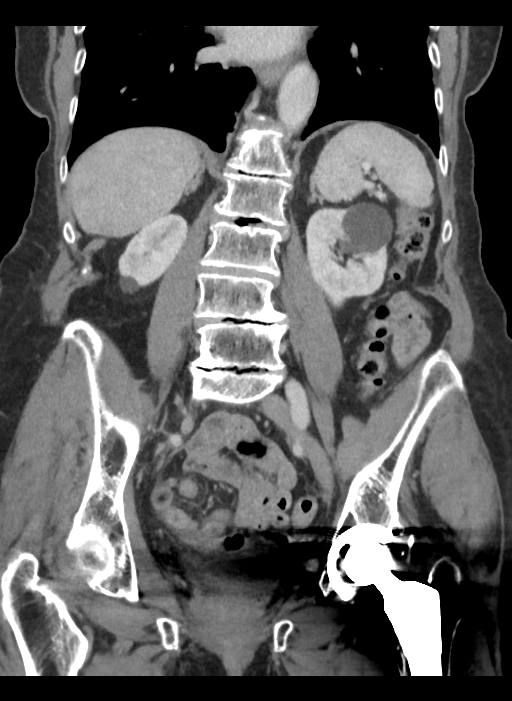

[15 of 46 positions shown; findings below may reference images not displayed]

FINDINGS: LUNG BASES: Lung bases are clear. No pleural effusions. 
HEPATOBILIARY: No mass or biliary dilatation. No gallstones. There are liver 
cysts present measuring up to 2.3 cm. 
SPLEEN: Normal in size. 
PANCREAS: No ductal dilatation or mass. 
ADRENALS: No mass. Stable enlargement of the left adrenal measuring 1.8 cm x
cm with noncontrasted Hounsfield units of +3, compatible with a left adrenal 
adenoma. The right adrenal is normal. 
GENITOURINARY: No enhancing mass or hydronephrosis.  Partial horseshoe kidney.. 
Bladder is unremarkable. 
LYMPH NODES: No adenopathy. 
STOMACH, SMALL BOWEL AND COLON: No bowel wall thickening or obstruction. Stomach 
is decompressed and difficult to assess. Sigmoid diverticulosis without active 
diverticulitis. Trace free fluid in the pelvis. 
VASCULAR STRUCTURES: No aneurysm. 
MUSCULOSKELETAL: No acute osseous abnormality. Scattered degenerative changes. 
There is streak artifact from left hip prosthesis limiting evaluation of the 
pelvis. 
ADDITIONAL FINDINGS: None.
IMPRESSION: Stable enlarged left adrenal which has Hounsfield units +3, compatible with a 
benign left adrenal adenoma. 
Partial horseshoe kidney with no radiopaque calculi or obstruction. 
A simple thin-walled liver and renal cysts. 
RADIATION DOSE REDUCTION: All CT scans are performed using radiation dose 
reduction techniques, when applicable.  Technical factors are evaluated and 
adjusted to ensure appropriate moderation of exposure.  Automated dose 
management technology is applied to adjust the radiation doses to minimize 
exposure while achieving diagnostic quality images.

## 2020-05-02 ENCOUNTER — Encounter

## 2020-05-02 MED ORDER — LEFLUNOMIDE 20 MG PO TABS
20 MG | ORAL_TABLET | Freq: Every day | ORAL | 2 refills | Status: DC
Start: 2020-05-02 — End: 2020-08-16

## 2020-05-02 NOTE — Telephone Encounter (Signed)
Last visit 04/04/2020 Last labs 03/30/2020 Next visit 07/04/2020

## 2020-05-03 NOTE — Other (Unsigned)
Progress Notes by Drue Stager., MD at 05/03/20 1600         Author: Drue Stager., MD Service:  Author Type: Physician  Filed: 05/03/20   1639 Encounter Date: 05/03/2020 Status: Signed  Editor: Drue Stager., MD   (Physician)   Jodi Walls returns to the [facility]JS[facility] [place] to discuss results   of an echocardiogram recently ordered because of a complaint of shortness of   breath. Prior office notes, medical records and diagnostic studies were   reviewed in conjunction with the encounter.        1. ?Left bundle branch block-This has been a stable finding on her surface   ECG. ?She is otherwise asymptomatic. ?Previous transthoracic echocardiogram   showed a structurally normal heart and preserved left ventricular systolic   function. 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 There is a low risk of progression to higher   degrees of potentially symptomatic heart block.  ?[JS.1]  2[JS.3].??[JS.1]Coronary calcium score less than 100-I recommended she   discontinue aspirin therapy.    3[JS.3]. Systolic heart failure, unspecified-Her recent echocardiogram   calculated left ventricular ejection fraction 46% with global hypokinesis and   paradoxical septal motion due to her intraventricular conduction delay, grade   1 diastolic dysfunction but no evidence of significant valvular heart disease.   Of note, prior transthoracic echocardiogram visually estimated her ejection   fraction 55-60%.[JS.1]    Her degree of shortness of breath seems disproportionate to the client in her   ejection fraction. I did not appreciate obvious signs or symptoms of heart   failure by physical examination. She has had somewhat of a chronic complaint   of dyspnea that I think may be functional related to her age and underlining    arthritic condition.[JS.3]  ?  Return to clinicin one year.  ?  --- Please note that I used ?voice recognition software to generate the above   note. Occasionally words are mistranscribed despite my best efforts to edit   errors. ---[JS.1]    Electronically signed by Drue Stager., MD on 05/03/20 1639.   Attribution Key        JS.1 - Drue Stager., MD on 05/03/20 [place].2 - Drue Stager., MD on 05/03/20 1603  JS.3 - Drue Stager., MD on 05/03/20 [phone]

## 2020-05-03 NOTE — Other (Unsigned)
Progress Notes by Madelyn Brunner., MD at 05/03/20 1600         Author: Madelyn Brunner., MD Service:  Author Type: Physician  Filed: 05/03/20   1639 Encounter Date: 05/03/2020 Status: Signed  Editor: Madelyn Brunner., MD   (Physician)   Subjective   Jodi Walls  06-Feb-1941  Chief Complaint Patient presents with ? Cardiovascular Follow-up   Echo   results 11/30 ? SOB/Dyspnea ? Fatigue ? Dizziness[JS.1]   Jodi Walls returns to the [facility] in [place] to discuss results of an echocardiogram recently ordered because of a   complaint of shortness of breath. Prior office notes, medical records and   diagnostic studies were reviewed in conjunction with the encounter.    She does continue to complain of dyspnea on exertion all this has been   somewhat of a chronic complaint. This is not been associated with any   increasing weight gain, orthopnea or paroxysmal nocturnal dyspnea.    She was accompanied today by her daughter.[JS.2]    Patient's Medications Current Medications  CHOLECALCIFEROL (VITAMIN D-3) 10   MCG (400 UNIT) TABLET  Take 400 Units by mouth daily.   Order Dose: 400   Units  CYANOCOBALAMIN 100 MCG TABLET  Take 100 mcg by mouth daily.   Order   Dose: 100 mcg  DULOXETINE (CYMBALTA) 60 MG CAPSULE, DELAYED RELEASE(E.C.)    Take 60 mg by mouth daily.   Order Dose: 60 mg  FUROSEMIDE (LASIX) 40 MG   TABLET  Take 40 mg by mouth daily. PRN   Order Dose: 40 mg  GABAPENTIN   (NEURONTIN) 300 MG CAPSULE  Take 300 mg by mouth 2 times daily.    Order Dose:   300 mg  HYDROXYCHLOROQUINE (PLAQUENIL) 200 MG TABLET  Take 200 mg by mouth   daily.   Order Dose: 200 mg  LEFLUNOMIDE (ARAVA) 20 MG TABLET  Take 20 mg by   mouth daily.   Order Dose: 20 mg  LEVOTHYROXINE (SYNTHROID) 75 MCG PO TABLET    daily   Order Dose: --  MELATONIN 3 MG TABLET  Take 3 mg by mouth nightly at   bedtime.   Order Dose: 3 mg  POTASSIUM CHLORIDE (KDUR) 10 MEQ PO TABLET  Take   10 mEq by mouth 2 times daily.  daily   Order Dose: 10 mEq  PREDNISONE   (DELTASONE) 5 MG TABLET  Take 5 mg by mouth daily.   Order Dose: 5 mg     Discontinued Medications  ASPIRIN 81 MG PO TABLET  Take 81 mg by mouth   daily.   Notes: >> Raeanne Barry Feb 21, 2016 1:47 PM[JS.1]          ROS   Review of Systems      Constitutional: Positive for fatigue.   HENT: Negative.   Eyes: Negative.   Respiratory: Positive for shortness of breath.   Cardiovascular: Negative.   Gastrointestinal: Negative.   Endocrine: Negative.   Genitourinary: Negative.   Musculoskeletal: Negative.   Skin: Negative.   Allergic/Immunologic: Negative.   Neurological: Positive for dizziness.   Hematological: Negative.   Psychiatric/Behavioral: Negative.[DP.1]          Objective   Vitals:    05/03/20 1605   BP: 136/80   Pulse: 82   Weight: 183 lb (83 kg)   Height: 5' (1.524 m)     Body mass index is 35.74 kg/m?.[JS.1]  Physical Exam[DP.1]  Vitals[JS.2] reviewed.   Constitutional:    General: She is[DP.1] not in acute distress[JS.2].   Appearance:[DP.1] Normal appearance[JS.2]. She is not[DP.1]   diaphoretic[JS.2].   Eyes:    General:[DP.1] Lids[JS.2] are normal.    Conjunctiva/sclera:[DP.1] Conjunctivae normal[JS.2].    Pupils:[DP.1] Pupils are equal, round, and reactive to light[JS.2].   Neck:    Thyroid: No[DP.1] thyroid mass[JS.2] or[DP.1] thyromegaly[JS.2].    Vascular:[DP.1] Normal carotid pulses[JS.2]. No[DP.1] carotid   bruit[JS.2],[DP.1] hepatojugular reflux[JS.2] or[DP.1] JVD[JS.2].    Trachea:[DP.1] Trachea[JS.2] and[DP.1] phonation[JS.2] normal.   Cardiovascular:    Rate and Rhythm:[DP.1] Normal rate[JS.2] and[DP.1] regular   rhythm[JS.2].[DP.1] No extrasystoles[JS.2] are present.   Chest Wall:[DP.1] PMI is not displaced[JS.2].    Pulses:[DP.1] Normal pulses[JS.2].    Heart sounds:[DP.1] Normal heart sounds[JS.2],[DP.1] S1 normal[JS.2]   and[DP.1] S2 normal[JS.2].[DP.1] No murmur[JS.2] heard.[KV.4]   No systolic[JS.2] murmur is present.[QV.9]   No  diastolic[JS.2] murmur is present.  No[DP.1] friction rub[JS.2]. No[DP.1] gallop[JS.2]. No[DP.1] S3[JS.2] or[DP.1]   S4[JS.2] sounds.   Pulmonary:    Effort: Pulmonary effort is[DP.1] normal[JS.2]. No[DP.1] respiratory   distress[JS.2].    Breath sounds: Normal[DP.1] breath sounds[JS.2]. No[DP.1] wheezing[JS.2].   Abdominal:    General: Bowel sounds are[DP.1] normal[JS.2].    Tenderness: There is[DP.1] no abdominal tenderness[JS.2].   Musculoskeletal:    General:[DP.1] Normal range of motion[JS.2].    Cervical back:[DP.1] Full passive range of motion without pain[JS.2],[DP.1]   normal range of motion[JS.2] and[DP.1] neck supple[JS.2].   Skin:   General: Skin is[DP.1] warm[JS.2] and[DP.1] dry[JS.2].    Findings: No[DP.1] abrasion[JS.2],[DP.1] bruising[JS.2],[DP.1]   burn[JS.2],[DP.1] ecchymosis[JS.2] or[DP.1] rash[JS.2].    Nails: There is no[DP.1] clubbing[JS.2].   Neurological:    Mental Status: She is[DP.1] alert[JS.2] and[DP.1] oriented to person, place,   and time[JS.2].    Deep Tendon Reflexes:[DP.1] Reflexes are normal and symmetric[JS.2].   Psychiatric:    Judgment:[DP.1] Judgment[JS.2] normal.                  IMPRESSION:[DP.1]        ICD-10-CM    1. Shortness of breath  R06.02 B NATRIURETIC PEPTIDE   2. LBBB (left bundle branch block)  I44.7    3. 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 Agatston coronary artery calcium score less than 100  R93.1[JS.1]          PLAN AND RECOMMENDATION:[DP.1]        1. ?Left bundle branch block-This has been a stable finding on her surface   ECG. ?She is otherwise asymptomatic. ?Previous transthoracic echocardiogram   showed a structurally normal heart and preserved left ventricular systolic   function. ?SPECT MPI October 2018 showed no reversible perfusion defect   concerning for ischemia. 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 There is a low risk of progression to higher degrees of   potentially symptomatic heart block.  ?  2.??Coronary calcium score less than  100-I recommended she discontinue aspirin   therapy.    3. Systolic heart failure, unspecified-Her recent echocardiogram calculated   left ventricular ejection fraction 46% with global hypokinesis and paradoxical   septal motion due to her intraventricular conduction delay, grade 1 diastolic   dysfunction but no evidence of significant valvular heart disease. Of note,   prior transthoracic echocardiogram visually estimated her ejection fraction   55-60%.    Her degree of shortness of breath seems disproportionate to the client in her   ejection fraction. I did not appreciate obvious signs or symptoms of heart  failure by physical examination. She has had somewhat of a chronic complaint   of dyspnea that I think may be functional related to her age and underlining   arthritic condition.    I recommended checking a proBNP.  ?  Return to clinicin six months.    --- Please note that I used ?voice recognition software to generate the above   note. Occasionally words are mistranscribed despite my best efforts to edit   errors. ---[JS.2]         Electronically signed by Madelyn Brunner., MD on 05/03/20 1639.   Attribution Key     DP.1 - Papania, Dorothea on 05/03/20 1600  JS.1 - Madelyn Brunner., MD on 05/03/20 1638  JS.2 - Madelyn Brunner., MD on 05/03/20 1637              Revision History           Date/Time  User  Provider Type  Action      >  05/03/20 1639  Madelyn Brunner., MD  Physician  Sign        05/03/20 1605  Vesta Mixer  Patient Care Assistant  Sign when Signing Visit

## 2020-05-26 ENCOUNTER — Encounter

## 2020-05-27 LAB — CBC WITH AUTO DIFFERENTIAL
Basophils %: 1.2 %
Basophils Absolute: 0.1 10*3/uL (ref 0.0–0.2)
Eosinophils %: 5.5 %
Eosinophils Absolute: 0.3 10*3/uL (ref 0.0–0.6)
Hematocrit: 37.4 % (ref 36.0–48.0)
Hemoglobin: 11.9 g/dL — ABNORMAL LOW (ref 12.0–16.0)
Lymphocytes %: 19.9 %
Lymphocytes Absolute: 1 10*3/uL (ref 1.0–5.1)
MCH: 28.1 pg (ref 26.0–34.0)
MCHC: 31.9 g/dL (ref 31.0–36.0)
MCV: 88.1 fL (ref 80.0–100.0)
MPV: 10.5 fL (ref 5.0–10.5)
Monocytes %: 8.7 %
Monocytes Absolute: 0.4 10*3/uL (ref 0.0–1.3)
Neutrophils %: 64.7 %
Neutrophils Absolute: 3.2 10*3/uL (ref 1.7–7.7)
Platelets: 188 10*3/uL (ref 135–450)
RBC: 4.24 M/uL (ref 4.00–5.20)
RDW: 13.8 % (ref 12.4–15.4)
WBC: 4.9 10*3/uL (ref 4.0–11.0)

## 2020-05-27 LAB — CREATININE
Creatinine: 1.3 mg/dL — ABNORMAL HIGH (ref 0.6–1.2)
GFR African American: 48 — AB (ref >60)
GFR Non-African American: 39 — AB (ref >60)

## 2020-05-27 LAB — ALT: ALT: 13 U/L (ref 10–40)

## 2020-05-27 LAB — AST: AST: 22 U/L (ref 15–37)

## 2020-06-29 ENCOUNTER — Encounter

## 2020-06-29 MED ORDER — LEVOTHYROXINE SODIUM 75 MCG PO TABS
75 MCG | ORAL_TABLET | ORAL | 1 refills | Status: DC
Start: 2020-06-29 — End: 2020-10-25

## 2020-06-29 MED ORDER — PREDNISONE 5 MG PO TABS
5 MG | ORAL_TABLET | Freq: Every day | ORAL | 1 refills | Status: DC
Start: 2020-06-29 — End: 2020-07-04

## 2020-06-29 NOTE — Telephone Encounter (Signed)
Last seen 04/04/20  Last labs 05/26/20  Next visit 07/04/20      Last filled 03/31/20 2 R/F

## 2020-06-30 LAB — ALT: ALT: 11 U/L (ref 10–40)

## 2020-06-30 LAB — CBC WITH AUTO DIFFERENTIAL
Basophils %: 0.9 %
Basophils Absolute: 0 10*3/uL (ref 0.0–0.2)
Eosinophils %: 2.9 %
Eosinophils Absolute: 0.2 10*3/uL (ref 0.0–0.6)
Hematocrit: 37.3 % (ref 36.0–48.0)
Hemoglobin: 11.9 g/dL — ABNORMAL LOW (ref 12.0–16.0)
Lymphocytes %: 24.7 %
Lymphocytes Absolute: 1.4 10*3/uL (ref 1.0–5.1)
MCH: 28.3 pg (ref 26.0–34.0)
MCHC: 31.8 g/dL (ref 31.0–36.0)
MCV: 88.9 fL (ref 80.0–100.0)
MPV: 10.3 fL (ref 5.0–10.5)
Monocytes %: 6.9 %
Monocytes Absolute: 0.4 10*3/uL (ref 0.0–1.3)
Neutrophils %: 64.6 %
Neutrophils Absolute: 3.6 10*3/uL (ref 1.7–7.7)
Platelets: 171 10*3/uL (ref 135–450)
RBC: 4.19 M/uL (ref 4.00–5.20)
RDW: 14.1 % (ref 12.4–15.4)
WBC: 5.6 10*3/uL (ref 4.0–11.0)

## 2020-06-30 LAB — AST: AST: 18 U/L (ref 15–37)

## 2020-06-30 LAB — CREATININE
Creatinine: 1.4 mg/dL — ABNORMAL HIGH (ref 0.6–1.2)
GFR African American: 44 — AB (ref >60)
GFR Non-African American: 36 — AB (ref >60)

## 2020-07-04 ENCOUNTER — Ambulatory Visit: Admit: 2020-07-04 | Discharge: 2020-07-04 | Payer: MEDICARE | Attending: Internal Medicine | Primary: Family Medicine

## 2020-07-04 DIAGNOSIS — M1990 Unspecified osteoarthritis, unspecified site: Secondary | ICD-10-CM

## 2020-07-04 MED ORDER — PREDNISONE 5 MG PO TABS
5 MG | ORAL_TABLET | Freq: Every day | ORAL | 1 refills | Status: AC
Start: 2020-07-04 — End: 2020-08-03

## 2020-07-04 MED ORDER — GABAPENTIN 300 MG PO CAPS
300 MG | ORAL_CAPSULE | ORAL | 1 refills | Status: DC
Start: 2020-07-04 — End: 2021-03-01

## 2020-07-04 MED ORDER — HYDROXYCHLOROQUINE SULFATE 200 MG PO TABS
200 MG | ORAL_TABLET | Freq: Every day | ORAL | 5 refills | Status: DC
Start: 2020-07-04 — End: 2021-01-03

## 2020-07-04 MED ORDER — DULOXETINE HCL 60 MG PO CPEP
60 MG | ORAL_CAPSULE | Freq: Every day | ORAL | 1 refills | Status: DC
Start: 2020-07-04 — End: 2021-05-17

## 2020-07-04 NOTE — Patient Instructions (Addendum)
Restart cymbalta   Continue prednisone, arava, plaquenil  Continue gabapentin

## 2020-07-04 NOTE — Progress Notes (Signed)
07/04/2020     Patient Name: Jodi Walls  DOB: 1941/04/17  MEDICAL RECORD [id number]    MEDICATIONS  Current Outpatient Medications   Medication Sig Dispense Refill   . predniSONE (DELTASONE) 5 MG tablet Take 1 tablet by mouth daily 90 tablet 1   . hydroxychloroquine (PLAQUENIL) 200 MG tablet Take 1 tablet by mouth daily 30 tablet 5   . gabapentin (NEURONTIN) 300 MG capsule TAKE 2 CAPSULES BY MOUTH DAILY WITH SUPPER 180 capsule 1   . DULoxetine (CYMBALTA) 60 MG extended release capsule Take 1 capsule by mouth daily 90 capsule 1   . levothyroxine (SYNTHROID) 75 MCG tablet TAKE 1 TABLET BY MOUTH DAILY 90 tablet 1   . leflunomide (ARAVA) 20 MG tablet TAKE 1 TABLET BY MOUTH DAILY 30 tablet 2   . diclofenac sodium (VOLTAREN) 1 % GEL APPLY 4 GRAMS TO LOWER EXTREMITY JOINTS AND 2 GRAMS TO UPPER EXTREMITY JOINTS FOUR TIMES DAILY AS NEEDED 500 g 1   . furosemide (LASIX) 40 MG tablet Take 1 tablet by mouth daily 30 tablet 2   . potassium chloride (KLOR-CON) 10 MEQ extended release tablet Take 10 mEq by mouth 2 times daily     . melatonin 3 MG TABS tablet Take 3 mg by mouth nightly      . aspirin 81 MG tablet Take 81 mg by mouth daily.       No current facility-administered medications for this visit.       ALLERGIES  Allergies   Allergen Reactions   . Pcn [Penicillins] Swelling     Face and hands swelled   . Triamterene      Severe stomach pain,SOB,severe cramps throughout her body   . Ace Inhibitors Other (See Comments)     cough   . Atenolol Other (See Comments)     Bradycardia (HR 40's)   . Atorvastatin Other (See Comments)     Muscle aches   . Zithromax [Azithromycin] Swelling   . Codeine Nausea And Vomiting         Comments  No specialty comments available.    Background history:  Jodi Walls is a 80 y.o. female breast cancer status post lumpectomy, hypertension, osteoarthritis, hypothyroidism, CKD 3 who is being seen for follow up evaluation of  joint pain.  She is complaining of pain in her hands and feet.  We  started 2 years ago.  Symptoms have progressively worsened over the past 1 year.  She has pain on daily basis, 10 out of 10.  She is swelling in her knuckles and feet.  She has morning stiffness lasting for 1 hour.  She has difficulty opening doorknobs and jars.  She has developed weakness in her hands and is dropping things.  She takes Tylenol 1000 mg twice a day with minimal relief.  She also takes Aleve as needed.  She is unable to take oral NSAIDs due to CKD 3.  She has a history of left knee replacement.  She has osteoarthritis in the right knee as well.  Right knee cracks and pops.  Pain in the right knee gets worse going up and down the steps and standing from the sitting position.  She had a trauma to the left hand in October 2019.  Since then left third MCP joint stays swollen and painful.  She had x-ray of the left hand which did not show any fracture.  She denies psoriasis, inflammatory bowel disease, inflammatory back pain, dactylitis, enthesitis, tenosynovitis or uveitis.  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  Her mother had arthritis  She had work-up with PCP which showed negative RF, ESR and normal TSH.    Interim history: She presents for follow-up of osteoarthritis, CPPD.  She is a little understanding of the medications she is taking.  She is on leflunomide 20 mg daily, prednisone 5 mg daily and Plaquenil 200 mg daily.  She continues to complain of pain in the hands and feet associated with tingling and numbness.   She has peripheral neuropathy.  She is takes 600 mg at bedtime.  She stopped taking Cymbalta couple of months ago.  She was not sure if she has to continue taking it.  She denies any side effects with the medications.  She has vitamin B1 and vitamin B6 deficiency.  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  Work-up came back negative for rheumatoid arthritis.  Hand and foot x-rays showed  moderate to severe degenerative changes with CPPD.  She denies any pseudogout flareups.  Work-up negative for secondary causes of pseudogout.  She denies any side effects with medications.    HPI  Review of Systems    REVIEW OF SYSTEMS:   Constitutional: No unanticipated weight loss or fevers.   Integumentary: No rash, photosensitivity, malar rash, livedo reticularis, alopecia and Raynaud's symptoms, sclerodactyly, skin tightening  Eyes: negative for visual disturbance and persistent redness, discharge from eyes   ENT: - No tinnitus, loss of hearing, vertigo, or recurrent ear infections.  - No history of nasal/oral ulcers.  - No history of dry eyes/dry mouth  Cardiovascular: No history of pericarditis, chest pain or murmur or palpitations  Respiratory: No shortness of breath, cough or history of interstitial lung disease.No history of pleurisy. No history of tuberculosis or atypical infections.  Gastrointestinal: No history of heart burn, dysphagia or esophageal dysmotility. No change in bowel habits or any inflammatory bowel disease.  Genitourinary: No history miscarriages.  Hematologic/Lymphatic: No abnormal bruising or bleeding, blood clots or swollen lymph nodes.  Neurological: No history of headaches, seizure or focal weakness. No history of neuropathies, paresthesias or hyperesthesias, facial droop, diplopia  Psychiatric: No history of bipolar disease, anxiety, depression  Endocrine: Denies any polyuria, polydipsia and osteoporosis  Allergic/Immunologic: No nasal congestion or hives.        I have reviewed patients Past medical History, Social History and Family History as mentioned in her chart and this remains unchanged fromprevious.    Past Medical History:   Diagnosis Date   . Anemia    . Arthritis    . Cancer ([place]) 1995    hx of breast cancer--no chemo or radiation needed   . GERD (gastroesophageal reflux disease)    . Hx of blood clots 2001    after gallbladder surgery--blood clot in arm where IV had  been   . Hypothyroidism    . Kidney failure     stage 3   . PONV (postoperative nausea and vomiting)    . Wears glasses     reading     Past Surgical History:   Procedure Laterality Date   . APPENDECTOMY     . ARTHROPLASTY Left 12/25/2018    LEFT MIDDLE FINGER METACARPO-PHALANGEAL JOINT SYNOVECTOMY, LIGAMENT REBALANCING, EXTENSOR TENDON CENTRALIZATION AND SILICONE METACARPOPHALANGEAL JOINT ARTHROPLASTY performed by Delene Loll, MD at [place]   . BACK SURGERY      r/t to herniated disc in lower back,  neck area--screws and rods in place   . BREAST BIOPSY      left breast biopsy   . BREAST SURGERY  1995    right mastectomy   . CHOLECYSTECTOMY  2001   . COLONOSCOPY  04/12/2015    Esophagogastroduodenoscopy with biopsy, Colonoscopy, diagnostic. no findings except for diverticula and check in 10 years   . HYSTERECTOMY      TAH-BSO. no  cancer   . JOINT REPLACEMENT Left     left total knee replacement   . LIPOMA RESECTION      right side below right breast   . MASTECTOMY, RADICAL  1995   . UPPER GASTROINTESTINAL ENDOSCOPY  04/12/2015    and colonoscopy. hiatal hernia and gastritis     Social History     Socioeconomic History   . Marital status: Widowed     Spouse name: Not on file   . Number of children: Not on file   . Years of education: Not on file   . Highest education level: Not on file   Occupational History   . Not on file   Tobacco Use   . Smoking status: Never Smoker   . Smokeless tobacco: Never Used   Vaping Use   . Vaping Use: Never used   Substance and Sexual Activity   . Alcohol use: Yes     Comment: social-rare. once a year   . Drug use: No   . Sexual activity: Not Currently   Other Topics Concern   . Not on file   Social History Narrative   . Not on file     Social Determinants of Health     Financial Resource Strain: Low Risk    . Difficulty of Paying Living Expenses: Not hard at all   Food Insecurity: No Food Insecurity   . Worried About Charity fundraiser in the Last Year: Never true   . Ran Out of  Food in the Last Year: Never true   Transportation Needs:    . Lack of Transportation (Medical): Not on file   . Lack of Transportation (Non-Medical): Not on file   Physical Activity:    . Days of Exercise per Week: Not on file   . Minutes of Exercise per Session: Not on file   Stress:    . Feeling of Stress : Not on file   Social Connections:    . Frequency of Communication with Friends and Family: Not on file   . Frequency of Social Gatherings with Friends and Family: Not on file   . Attends Religious Services: Not on file   . Active Member of Clubs or Organizations: Not on file   . Attends Archivist Meetings: Not on file   . Marital Status: Not on file   Intimate Partner Violence:    . Fear of Current or Ex-Partner: Not on file   . Emotionally Abused: Not on file   . Physically Abused: Not on file   . Sexually Abused: Not on file   Housing Stability:    . Unable to Pay for Housing in the Last Year: Not on file   . Number of Places Lived in the Last Year: Not on file   . Unstable Housing in the Last Year: Not on file     Family History   Problem Relation Age of Onset   . Cancer Mother         lung    . Cancer Father  bladder   . Heart Disease Father    . High Blood Pressure Father    . Cancer Maternal Aunt         ovarian   . Cancer Paternal Cousin         ovarian       Vitals:    07/04/20 1314   BP: 138/88   Pulse: 62   Weight: 184 lb 6.4 oz (83.6 kg)     Physical Exam  Constitutional:  Well developed, well nourished, no acute distress, non-toxic appearance   Musculoskeletal:    RIGHT  Swell  Tender  ROM  LEFT  Swell  Tender  ROM    DIP2  0  0   [place]  0  0   [place]   DIP3  0  0   [place]  0  0   [place]   DIP4  0  0   [place]  0  0   [place]   DIP5  0  0   [place]  0  0   [place]   PIP1  0  0   bony change  0  0   bony change   PIP2  0  0  FULL   0  0  FULL    PIP3  0  0  FULL   0  0  FULL    PIP4  0  0  FULL   0  0  FULL    PIP5  0  0  FULL   0  0  FULL    MCP1  0  0   bony change   0  0   bony change   MCP2  0  0   bony change  0  0   bony change   MCP3  0  0   bony change  0  0   bony change+++   MCP4  0  0  FULL   0  0  FULL    MCP5  0  0  FULL   0  0  FULL    Wrist  0  0  FULL   0  0  FULL    Elbow  0  0  FULL   0  0  FULL    Shouldr  0  0  FULL   0  0  FULL    Hip  0  0  FULL   0  0  FULL    Knee  0  0  CREPITUS/VARUS   0  0  TKR   Ankle  0  0  FULL   0  0  FULL    MTP1  0  + FULL   0  + FULL    MTP2  0  + FULL   0  +  FULL    MTP3  0  + FULL   0  +  FULL    MTP4  0  + FULL   0  +  FULL    MTP5  0  + FULL   0  +  FULL    IP1  0  0  FULL   0  0  FULL    IP2  0  0  FULL   0  0  FULL    IP3  0  0  FULL   0  0  FULL    IP4  0  0  FULL   0  0  FULL    IP5  0  0  FULL   0 0 FULL     Squaring and bony enlargement of bilateral CMC joints  Trace soft tissue swelling in bilateral PIP joints  Ambulates without assistance, normal gait  Neck: Full ROM, no tenderness,supple   Back-  Tenderness++  Eyes:  PERRL, extra ocular movements intact, conjunctiva normal   HEENT:  Atraumatic, normocephalic, external ears normal, oropharynx moist, no pharyngeal exudates.   Respiratory:  No respiratory distress  GI:  Soft, nondistended, normal bowel sounds, nontender, noorganomegaly, no mass, no rebound, no guarding   GU:  No costovertebral angle tenderness   Integument:  Well hydrated, no rash or telangiectasias  Lymphatic:  No lymphadenopathy noted   Neurologic:   Alert & oriented x 3, CN 2-12 normal, no focal deficits noted. Sensations Intact. Muscle strength 5/5 proximallyand distally in upper and lower extremities.   Psychiatric:  Speech and behavior appropriate           LABS AND IMAGING  Outside data reviewed and in HPI    Lab Results   Component Value Date    WBC 5.6 06/29/2020    RBC 4.19 06/29/2020    RBC 4.72 10/26/2015    HGB 11.9 06/29/2020    HCT 37.3 06/29/2020    PLT 171 06/29/2020    MCV 88.9 06/29/2020    MCH 28.3 06/29/2020    MCHC 31.8 06/29/2020    RDW 14.1 06/29/2020    SEGSPCT 60.4 02/02/2010     LYMPHOPCT 24.7 06/29/2020    LYMPHOPCT 24.7 10/26/2015    MONOPCT 6.9 06/29/2020    EOSPCT 4.3 02/02/2010    BASOPCT 0.9 06/29/2020    MONOSABS 0.4 06/29/2020    LYMPHSABS 1.4 06/29/2020    EOSABS 0.2 06/29/2020    BASOSABS 0.0 06/29/2020    DIFFTYPE Auto 02/02/2010       Chemistry        Component Value Date/Time    NA 143 10/22/2019 1211    K 4.0 10/22/2019 1211    CL 103 10/22/2019 1211    CO2 24 10/22/2019 1211    BUN 29 (H) 10/22/2019 1211    CREATININE 1.4 (H) 06/29/2020 1038        Component Value Date/Time    CALCIUM 9.6 10/22/2019 1211    ALKPHOS 108 06/24/2019 0933    AST 18 06/29/2020 1038    ALT 11 06/29/2020 1038    BILITOT 0.5 06/24/2019 0933          Lab Results   Component Value Date    SEDRATE 24 06/24/2019     Lab Results   Component Value Date    CRP <3.0 06/24/2019     Lab Results   Component Value Date    ANA Negative 11/12/2018     Lab Results   Component Value Date    RF <10.0 10/24/2018     Lab Results   Component Value Date    ANA Negative 11/12/2018     No results found for: DSDNAG, DSDNAIGGIFA  No results found for: SSAROAB, SSALAAB  No results found for: SMAB, RNPAB  No results found for: CENTABIGG  No results found for: C3, C4, ACE  Lab Results   Component Value Date    VITD25 36.9 11/12/2018     No results found for: [employer]  Lab Results   Component Value Date    VITAMINB12 1860 (H) 06/19/2019  Lab Results   Component Value Date    TSH 2.05 10/22/2019     Lab Results   Component Value Date    VITD25 36.9 11/12/2018       Hand and foot x-rays 11/11/2018  FINDINGS:    Right hand:        Osseous alignment is normal.        Severe degenerative changes of the 1st [facility] joint. Mild degenerative changes    of the DIP joints and 1st IP joint. Chondrocalcinosis of the TFCC.        No marginal erosions are identified. No acute fracture or gross dislocation    is seen.        No focal soft tissue swelling is evident.        Left hand:        Osseous alignment is normal.         Mild degenerative changes of the DIP joints, 1st MCP joint and triscaphe    joint. Mild to moderate degenerative change of the 1st [facility] joint.    Chondrocalcinosis of the TFCC.        No marginal erosions are identified. No acute fracture or gross dislocation    is seen.        No focal soft tissue swelling is evident.        Right foot:        Osseous alignment is normal. Mild to moderate degenerative changes of the    TMT joints primarily at the 1st TMT joint.        Moderate plantar calcaneal spur. Soft tissue swelling at the right forefoot.    Calcification along the plantar fascia. No marginal erosions are identified.    No acute fracture or gross dislocation is seen.        The medial and middle cuneiforms demonstrate proper alignment with the base    of the 1st and 2nd metatarsals respectively.        No tibiotalar joint effusion is seen.        Boehler's angle is maintained.        Left foot:        Osseous alignment is normal.        Moderate degenerative changes of the midfoot and tarsal metatarsal joints.    Remote healed fracture deformities of the 2nd 3rd metatarsal diaphyses. Mild    plantar calcaneal spur. Calcification of the plantar fascia. Os trigonum    variant. Soft tissue swelling of the left forefoot. No marginal erosions    are identified. No acute fracture or gross dislocation is seen.        The medial and middle cuneiforms demonstrate proper alignment with the base    of the 1st and 2nd metatarsals respectively.        No tibiotalar joint effusion is seen.        Boehler's angle is maintained.            Impression    Right hand:        1. Osteoarthrosis which is severe at the 1st [facility] joint. CPPD.    2. No acute fracture or dislocation.    Left hand:        1. Mild to moderate osteoarthrosis. CPPD.    2. No acute fracture or dislocation.    Right foot:        1. Mild-to-moderate degenerative changes as detailed above. Moderate plantar    calcaneal spur.  2. Soft tissue swelling of the right forefoot.    3. No acute fracture or dislocation.    Left foot:        1. Moderate diffuse degenerative changes as detailed above.    2. Remote healed fracture deformities of the 2nd and 3rd metatarsal diaphyses.    3. Mild plantar calcaneal spur.    4. Soft tissue swelling of the forefoot.    5. No acute fracture or dislocation.        MRI left hand 11/15/2018  1. Mild volar subluxation of the 3rd metacarpophalangeal joint with mild    degenerative changes and a small effusion.    2. Severe 1st carpometacarpal degenerative changes. Moderate 1st    interphalangeal degenerative changes       ASSESSMENT AND PLAN      Assessment/Plan:      ASSESSMENT:    1. Inflammatory osteoarthritis    2. Peripheral polyneuropathy    3. Primary osteoarthritis involving multiple joints    4. H/O calcium pyrophosphate deposition disease (CPPD)    5. 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  Inflammatory osteoarthritis  Osteoarthritis/ Joint pain : Discussed about diagnosis and management of osteoarthritis. There are no FDA approved disease modifying agents. Goal is to control pain and increase mobility. Discussed the importance of wt loss, exercises, formal PT, joint braces/splints. First line of agent Tylenol arthritis, NSAIDs systemic and/ or topical,Cymbalta, pain meds, joint injections- steroids, and visco supplementaiton for knee OA. Also discussed about surgical options.  Continues to be symptomatic.  Most likely inflammatory osteoarthritis with peripheral neuropathy.  -RF, CCP negative, normal ESR and CRP.  Hand and foot x-rays with moderate to severe degenerative arthritis with CPPD  Unable to take oral NSAIDs due to CKD  Advised to take Tylenol 1000 mg 3 times a day, do not exceed 3 g daily  Continue diclofenac gel  Continue Plaquenil 200 mg daily  Continue leflunomide 20 mg daily and prednisone 5 mg daily  I will refer to  occupational therapy    2.  Peripheral neuropathy  Blood work showed vitamin B1 and vitamin B6 deficiency.  She was placed on replacement.  Magnesium, B12, TSH, copper SPEP, hemoglobin A1c normal.    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   Advised to restart Cymbalta 60 mg daily and continue gabapentin 600 mg at bedtime        3. H/O calcium pyrophosphate deposition disease (CPPD)  Work-up negative for secondary causes     Diagnosis Orders   1. Inflammatory osteoarthritis  predniSONE (DELTASONE) 5 MG tablet    hydroxychloroquine (PLAQUENIL) 200 MG tablet    [place]   2. Peripheral polyneuropathy  gabapentin (NEURONTIN) 300 MG capsule    DULoxetine (CYMBALTA) 60 MG extended release capsule   3. Primary osteoarthritis involving multiple joints  [place]   4. H/O calcium pyrophosphate deposition disease (CPPD)     5. 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      Return in about 3 months (around 10/04/2020).      The risks and  benefits of my recommendations, as well as other treatment options, benefits and side effects werediscussed with the patient. All questions were answered.       ######################################################################    I thank you for giving me theopportunity to participate in Jodi Walls care. If you have any questions or concerns please feel free to contact me. I look forward to following  Jodi Walls along with you.      Electronically signed by: Oletta Lamas, MD, MD, 07/04/2020 1:48 PM    Documentation was done using voice recognition dragon software.  Every effort was made to ensure accuracy;however, inadvertent unintentional computerized transcription errors may be  present.

## 2020-08-08 ENCOUNTER — Encounter

## 2020-08-09 LAB — AST: AST: 17 U/L (ref 15–37)

## 2020-08-09 LAB — CREATININE
Creatinine: 1.3 mg/dL — ABNORMAL HIGH (ref 0.6–1.2)
GFR African American: 48 — AB (ref >60)
GFR Non-African American: 39 — AB (ref >60)

## 2020-08-09 LAB — CBC WITH AUTO DIFFERENTIAL
Basophils %: 1 %
Basophils Absolute: 0 10*3/uL (ref 0.0–0.2)
Eosinophils %: 2.8 %
Eosinophils Absolute: 0.1 10*3/uL (ref 0.0–0.6)
Hematocrit: 37.2 % (ref 36.0–48.0)
Hemoglobin: 12.1 g/dL (ref 12.0–16.0)
Lymphocytes %: 29.9 %
Lymphocytes Absolute: 1.5 10*3/uL (ref 1.0–5.1)
MCH: 28.5 pg (ref 26.0–34.0)
MCHC: 32.5 g/dL (ref 31.0–36.0)
MCV: 87.7 fL (ref 80.0–100.0)
MPV: 10 fL (ref 5.0–10.5)
Monocytes %: 6.9 %
Monocytes Absolute: 0.3 10*3/uL (ref 0.0–1.3)
Neutrophils %: 59.4 %
Neutrophils Absolute: 2.9 10*3/uL (ref 1.7–7.7)
Platelets: 202 10*3/uL (ref 135–450)
RBC: 4.25 M/uL (ref 4.00–5.20)
RDW: 14.2 % (ref 12.4–15.4)
WBC: 4.9 10*3/uL (ref 4.0–11.0)

## 2020-08-09 LAB — ALT: ALT: 9 U/L — ABNORMAL LOW (ref 10–40)

## 2020-08-16 ENCOUNTER — Encounter

## 2020-08-16 MED ORDER — LEFLUNOMIDE 20 MG PO TABS
20 MG | ORAL_TABLET | Freq: Every day | ORAL | 2 refills | Status: DC
Start: 2020-08-16 — End: 2020-11-22

## 2020-08-16 NOTE — Telephone Encounter (Signed)
LastVisit 07/04/2020   LastLabs 08/08/2020  NextVisit 10/03/2020   LastRefilled 05/02/2020

## 2020-09-20 ENCOUNTER — Encounter

## 2020-09-21 LAB — CBC WITH AUTO DIFFERENTIAL
Basophils %: 0.7 %
Basophils Absolute: 0.1 10*3/uL (ref 0.0–0.2)
Eosinophils %: 2.4 %
Eosinophils Absolute: 0.2 10*3/uL (ref 0.0–0.6)
Hematocrit: 38.6 % (ref 36.0–48.0)
Hemoglobin: 12.6 g/dL (ref 12.0–16.0)
Lymphocytes %: 24.3 %
Lymphocytes Absolute: 1.8 10*3/uL (ref 1.0–5.1)
MCH: 28.4 pg (ref 26.0–34.0)
MCHC: 32.5 g/dL (ref 31.0–36.0)
MCV: 87.5 fL (ref 80.0–100.0)
MPV: 10.1 fL (ref 5.0–10.5)
Monocytes %: 8.9 %
Monocytes Absolute: 0.7 10*3/uL (ref 0.0–1.3)
Neutrophils %: 63.7 %
Neutrophils Absolute: 4.8 10*3/uL (ref 1.7–7.7)
Platelets: 263 10*3/uL (ref 135–450)
RBC: 4.42 M/uL (ref 4.00–5.20)
RDW: 13.9 % (ref 12.4–15.4)
WBC: 7.6 10*3/uL (ref 4.0–11.0)

## 2020-09-21 LAB — AST: AST: 19 U/L (ref 15–37)

## 2020-09-21 LAB — ALT: ALT: 9 U/L — ABNORMAL LOW (ref 10–40)

## 2020-09-21 LAB — CREATININE
Creatinine: 1.4 mg/dL — ABNORMAL HIGH (ref 0.6–1.2)
GFR African American: 44 — AB (ref >60)
GFR Non-African American: 36 — AB (ref >60)

## 2020-10-03 ENCOUNTER — Encounter: Admit: 2020-10-03 | Discharge: 2020-10-03 | Payer: MEDICARE | Attending: Internal Medicine | Primary: Family Medicine

## 2020-10-03 DIAGNOSIS — M1990 Unspecified osteoarthritis, unspecified site: Secondary | ICD-10-CM

## 2020-10-03 NOTE — Patient Instructions (Signed)
Continue same meds  Refer to OT

## 2020-10-03 NOTE — Progress Notes (Signed)
10/03/2020     Patient Name: Jodi Walls  DOB: [birth year]  MEDICAL RECORD [id number]    MEDICATIONS  Current Outpatient Medications   Medication Sig Dispense Refill   . predniSONE (DELTASONE) 5 MG tablet      . leflunomide (ARAVA) 20 MG tablet TAKE 1 TABLET BY MOUTH DAILY 30 tablet 2   . hydroxychloroquine (PLAQUENIL) 200 MG tablet Take 1 tablet by mouth daily 30 tablet 5   . gabapentin (NEURONTIN) 300 MG capsule TAKE 2 CAPSULES BY MOUTH DAILY WITH SUPPER 180 capsule 1   . DULoxetine (CYMBALTA) 60 MG extended release capsule Take 1 capsule by mouth daily 90 capsule 1   . levothyroxine (SYNTHROID) 75 MCG tablet TAKE 1 TABLET BY MOUTH DAILY 90 tablet 1   . diclofenac sodium (VOLTAREN) 1 % GEL APPLY 4 GRAMS TO LOWER EXTREMITY JOINTS AND 2 GRAMS TO UPPER EXTREMITY JOINTS FOUR TIMES DAILY AS NEEDED 500 g 1   . furosemide (LASIX) 40 MG tablet Take 1 tablet by mouth daily 30 tablet 2   . potassium chloride (KLOR-CON) 10 MEQ extended release tablet Take 10 mEq by mouth 2 times daily     . melatonin 3 MG TABS tablet Take 3 mg by mouth nightly      . aspirin 81 MG tablet Take 81 mg by mouth daily.       No current facility-administered medications for this visit.       ALLERGIES  Allergies   Allergen Reactions   . Pcn [Penicillins] Swelling     Face and hands swelled   . Triamterene      Severe stomach pain,SOB,severe cramps throughout her body   . Ace Inhibitors Other (See Comments)     cough   . Atenolol Other (See Comments)     Bradycardia (HR 40's)   . Atorvastatin Other (See Comments)     Muscle aches   . Zithromax [Azithromycin] Swelling   . Codeine Nausea And Vomiting         Comments  No specialty comments available.    Background history:  Jodi Walls is a 80 y.o. female breast cancer status post lumpectomy, hypertension, osteoarthritis, hypothyroidism, CKD 3 who is being seen for follow up evaluation of  joint pain.  She is complaining of pain in her hands and feet.  We started 2 years ago.  Symptoms have  progressively worsened over the past 1 year.  She has pain on daily basis, 10 out of 10.  She is swelling in her knuckles and feet.  She has morning stiffness lasting for 1 hour.  She has difficulty opening doorknobs and jars.  She has developed weakness in her hands and is dropping things.  She takes Tylenol 1000 mg twice a day with minimal relief.  She also takes Aleve as needed.  She is unable to take oral NSAIDs due to CKD 3.  She has a history of left knee replacement.  She has osteoarthritis in the right knee as well.  Right knee cracks and pops.  Pain in the right knee gets worse going up and down the steps and standing from the sitting position.  She had a trauma to the left hand in October 2019.  Since then left third MCP joint stays swollen and painful.  She had x-ray of the left hand which did not show any fracture.  She denies psoriasis, inflammatory bowel disease, inflammatory back pain, dactylitis, enthesitis, tenosynovitis or uveitis.  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  Her mother had arthritis  She had work-up with PCP which showed negative RF, ESR and normal TSH.    Interim history: She presents for follow-up of osteoarthritis, CPPD.   She is on leflunomide 20 mg daily, prednisone 5 mg daily and Plaquenil 200 mg daily.  She continues to complain of pain in the hands and feet associated with tingling and numbness.   She has peripheral neuropathy.  She is takes 600 mg at bedtime and Cymbalta 60 mg daily.    She denies any side effects with the medications.  She has vitamin B1 and vitamin B6 deficiency.  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  Work-up came back negative for rheumatoid arthritis.  Hand and foot x-rays showed moderate to severe degenerative changes with CPPD.  She denies any pseudogout flareups.  Work-up negative for secondary causes of pseudogout.  She denies any side effects with  medications.    HPI  Review of Systems    REVIEW OF SYSTEMS:   Constitutional: No unanticipated weight loss or fevers.   Integumentary: No rash, photosensitivity, malar rash, livedo reticularis, alopecia and Raynaud's symptoms, sclerodactyly, skin tightening  Eyes: negative for visual disturbance and persistent redness, discharge from eyes   ENT: - No tinnitus, loss of hearing, vertigo, or recurrent ear infections.  - No history of nasal/oral ulcers.  - No history of dry eyes/dry mouth  Cardiovascular: No history of pericarditis, chest pain or murmur or palpitations  Respiratory: No shortness of breath, cough or history of interstitial lung disease.No history of pleurisy. No history of tuberculosis or atypical infections.  Gastrointestinal: No history of heart burn, dysphagia or esophageal dysmotility. No change in bowel habits or any inflammatory bowel disease.  Genitourinary: No history miscarriages.  Hematologic/Lymphatic: No abnormal bruising or bleeding, blood clots or swollen lymph nodes.  Neurological: No history of headaches, seizure or focal weakness. No history of neuropathies, paresthesias or hyperesthesias, facial droop, diplopia  Psychiatric: No history of bipolar disease, anxiety, depression  Endocrine: Denies any polyuria, polydipsia and osteoporosis  Allergic/Immunologic: No nasal congestion or hives.        I have reviewed patients Past medical History, Social History and Family History as mentioned in her chart and this remains unchanged fromprevious.    Past Medical History:   Diagnosis Date   . Anemia    . Arthritis    . Cancer ([place]) 1995    hx of breast cancer--no chemo or radiation needed   . GERD (gastroesophageal reflux disease)    . Hx of blood clots 2001    after gallbladder surgery--blood clot in arm where IV had been   . Hypothyroidism    . Kidney failure     stage 3   . PONV (postoperative nausea and vomiting)    . Wears glasses     reading     Past Surgical History:   Procedure  Laterality Date   . APPENDECTOMY     . ARTHROPLASTY Left 12/25/2018    LEFT MIDDLE FINGER METACARPO-PHALANGEAL JOINT SYNOVECTOMY, LIGAMENT REBALANCING, EXTENSOR TENDON CENTRALIZATION AND SILICONE METACARPOPHALANGEAL JOINT ARTHROPLASTY performed by Delene Loll, MD at [place]   . BACK SURGERY      r/t to herniated disc in lower back, neck area--screws and rods in place   . BREAST BIOPSY      left breast biopsy   . [place]    right  mastectomy   . CHOLECYSTECTOMY  2001   . COLONOSCOPY  04/12/2015    Esophagogastroduodenoscopy with biopsy, Colonoscopy, diagnostic. no findings except for diverticula and check in 10 years   . HYSTERECTOMY      TAH-BSO. no  cancer   . JOINT REPLACEMENT Left     left total knee replacement   . LIPOMA RESECTION      right side below right breast   . MASTECTOMY, RADICAL  1995   . UPPER GASTROINTESTINAL ENDOSCOPY  04/12/2015    and colonoscopy. hiatal hernia and gastritis     Social History     Socioeconomic History   . Marital status: Widowed     Spouse name: Not on file   . Number of children: Not on file   . Years of education: Not on file   . Highest education level: Not on file   Occupational History   . Not on file   Tobacco Use   . Smoking status: Never Smoker   . Smokeless tobacco: Never Used   Vaping Use   . Vaping Use: Never used   Substance and Sexual Activity   . Alcohol use: Yes     Comment: social-rare. once a year   . Drug use: No   . Sexual activity: Not Currently   Other Topics Concern   . Not on file   Social History Narrative   . Not on file     Social Determinants of Health     Financial Resource Strain: Low Risk    . Difficulty of Paying Living Expenses: Not hard at all   Food Insecurity: No Food Insecurity   . Worried About Charity fundraiser in the Last Year: Never true   . Ran Out of Food in the Last Year: Never true   Transportation Needs:    . Lack of Transportation (Medical): Not on file   . Lack of Transportation (Non-Medical): Not on file   Physical  Activity:    . Days of Exercise per Week: Not on file   . Minutes of Exercise per Session: Not on file   Stress:    . Feeling of Stress : Not on file   Social Connections:    . Frequency of Communication with Friends and Family: Not on file   . Frequency of Social Gatherings with Friends and Family: Not on file   . Attends Religious Services: Not on file   . Active Member of Clubs or Organizations: Not on file   . Attends Archivist Meetings: Not on file   . Marital Status: Not on file   Intimate Partner Violence:    . Fear of Current or Ex-Partner: Not on file   . Emotionally Abused: Not on file   . Physically Abused: Not on file   . Sexually Abused: Not on file   Housing Stability:    . Unable to Pay for Housing in the Last Year: Not on file   . Number of Places Lived in the Last Year: Not on file   . Unstable Housing in the Last Year: Not on file     Family History   Problem Relation Age of Onset   . Cancer Mother         lung    . Cancer Father         bladder   . Heart Disease Father    . High Blood Pressure Father    . Cancer Maternal Aunt  ovarian   . Cancer Paternal Cousin         ovarian       Vitals:    10/03/20 1423   BP: (!) 148/88   Pulse: 96   Weight: 179 lb (81.2 kg)     Physical Exam  Constitutional:  Well developed, well nourished, no acute distress, non-toxic appearance   Musculoskeletal:    RIGHT  Swell  Tender  ROM  LEFT  Swell  Tender  ROM    DIP2  0  0   [place]  0  0   [place]   DIP3  0  0   [place]  0  0   [place]   DIP4  0  0   [place]  0  0   [place]   DIP5  0  0   [place]  0  0   [place]   PIP1  0  0   bony change  0  0   bony change   PIP2  0  0  FULL   0  0  FULL    PIP3  0  0  FULL   0  0  FULL    PIP4  0  0  FULL   0  0  FULL    PIP5  0  0  FULL   0  0  FULL    MCP1  0  0   bony change  0  0   bony change   MCP2  0  0   bony change++  0  0   bony change   MCP3  0  0   bony change  0  0   bony change+++   MCP4  0  0  FULL   0  0  FULL    MCP5  0  0  FULL   0  0   FULL    Wrist  0  0  FULL   0  0  FULL    Elbow  0  0  FULL   0  0  FULL    Shouldr  0  0  FULL   0  0  FULL    Hip  0  0  FULL   0  0  FULL    Knee  0  0  CREPITUS/VARUS   0  0  TKR   Ankle  0  0  FULL   0  0  FULL    MTP1  0  + FULL   0  + FULL    MTP2  0  + FULL   0  +  FULL    MTP3  0  + FULL   0  +  FULL    MTP4  0  + FULL   0  +  FULL    MTP5  0  + FULL   0  +  FULL    IP1  0  0  FULL   0  0  FULL    IP2  0  0  FULL   0  0  FULL    IP3  0  0  FULL   0  0  FULL    IP4  0  0  FULL   0  0  FULL    IP5  0  0  FULL   0 0 FULL     Squaring and bony  enlargement of bilateral CMC joints  Trace soft tissue swelling in bilateral PIP joints  Ambulates without assistance, normal gait  Neck: Full ROM, no tenderness,supple   Back-  Tenderness++  Eyes:  PERRL, extra ocular movements intact, conjunctiva normal   HEENT:  Atraumatic, normocephalic, external ears normal, oropharynx moist, no pharyngeal exudates.   Respiratory:  No respiratory distress  GI:  Soft, nondistended, normal bowel sounds, nontender, noorganomegaly, no mass, no rebound, no guarding   GU:  No costovertebral angle tenderness   Integument:  Well hydrated, no rash or telangiectasias  Lymphatic:  No lymphadenopathy noted   Neurologic:   Alert & oriented x 3, CN 2-12 normal, no focal deficits noted. Sensations Intact. Muscle strength 5/5 proximallyand distally in upper and lower extremities.   Psychiatric:  Speech and behavior appropriate           LABS AND IMAGING  Outside data reviewed and in HPI    Lab Results   Component Value Date    WBC 7.6 09/20/2020    RBC 4.42 09/20/2020    RBC 4.72 10/26/2015    HGB 12.6 09/20/2020    HCT 38.6 09/20/2020    PLT 263 09/20/2020    MCV 87.5 09/20/2020    MCH 28.4 09/20/2020    MCHC 32.5 09/20/2020    RDW 13.9 09/20/2020    SEGSPCT 60.4 02/02/2010    LYMPHOPCT 24.3 09/20/2020    LYMPHOPCT 24.7 10/26/2015    MONOPCT 8.9 09/20/2020    EOSPCT 4.3 02/02/2010    BASOPCT 0.7 09/20/2020    MONOSABS 0.7 09/20/2020    LYMPHSABS  1.8 09/20/2020    EOSABS 0.2 09/20/2020    BASOSABS 0.1 09/20/2020    DIFFTYPE Auto 02/02/2010       Chemistry        Component Value Date/Time    NA 143 10/22/2019 1211    K 4.0 10/22/2019 1211    CL 103 10/22/2019 1211    CO2 24 10/22/2019 1211    BUN 29 (H) 10/22/2019 1211    CREATININE 1.4 (H) 09/20/2020 1526        Component Value Date/Time    CALCIUM 9.6 10/22/2019 1211    ALKPHOS 108 06/24/2019 0933    AST 19 09/20/2020 1526    ALT 9 (L) 09/20/2020 1526    BILITOT 0.5 06/24/2019 0933          Lab Results   Component Value Date    SEDRATE 24 06/24/2019     Lab Results   Component Value Date    CRP <3.0 06/24/2019     Lab Results   Component Value Date    ANA Negative 11/12/2018     Lab Results   Component Value Date    RF <10.0 10/24/2018     Lab Results   Component Value Date    ANA Negative 11/12/2018     No results found for: DSDNAG, DSDNAIGGIFA  No results found for: SSAROAB, SSALAAB  No results found for: SMAB, RNPAB  No results found for: CENTABIGG  No results found for: C3, C4, ACE  Lab Results   Component Value Date    VITD25 36.9 11/12/2018     No results found for: [employer]  Lab Results   Component Value Date    VITAMINB12 1860 (H) 06/19/2019     Lab Results   Component Value Date    TSH 2.05 10/22/2019     Lab Results   Component Value Date  VITD25 36.9 11/12/2018       Hand and foot x-rays 11/11/2018  FINDINGS:    Right hand:        Osseous alignment is normal.        Severe degenerative changes of the 1st [facility] joint. Mild degenerative changes    of the DIP joints and 1st IP joint. Chondrocalcinosis of the TFCC.        No marginal erosions are identified. No acute fracture or gross dislocation    is seen.        No focal soft tissue swelling is evident.        Left hand:        Osseous alignment is normal.        Mild degenerative changes of the DIP joints, 1st MCP joint and triscaphe    joint. Mild to moderate degenerative change of the 1st [facility] joint.    Chondrocalcinosis of  the TFCC.        No marginal erosions are identified. No acute fracture or gross dislocation    is seen.        No focal soft tissue swelling is evident.        Right foot:        Osseous alignment is normal. Mild to moderate degenerative changes of the    TMT joints primarily at the 1st TMT joint.        Moderate plantar calcaneal spur. Soft tissue swelling at the right forefoot.    Calcification along the plantar fascia. No marginal erosions are identified.    No acute fracture or gross dislocation is seen.        The medial and middle cuneiforms demonstrate proper alignment with the base    of the 1st and 2nd metatarsals respectively.        No tibiotalar joint effusion is seen.        Boehler's angle is maintained.        Left foot:        Osseous alignment is normal.        Moderate degenerative changes of the midfoot and tarsal metatarsal joints.    Remote healed fracture deformities of the 2nd 3rd metatarsal diaphyses. Mild    plantar calcaneal spur. Calcification of the plantar fascia. Os trigonum    variant. Soft tissue swelling of the left forefoot. No marginal erosions    are identified. No acute fracture or gross dislocation is seen.        The medial and middle cuneiforms demonstrate proper alignment with the base    of the 1st and 2nd metatarsals respectively.        No tibiotalar joint effusion is seen.        Boehler's angle is maintained.            Impression    Right hand:        1. Osteoarthrosis which is severe at the 1st [facility] joint. CPPD.    2. No acute fracture or dislocation.    Left hand:        1. Mild to moderate osteoarthrosis. CPPD.    2. No acute fracture or dislocation.    Right foot:        1. Mild-to-moderate degenerative changes as detailed above. Moderate plantar    calcaneal spur.    2. Soft tissue swelling of the right forefoot.    3. No acute fracture or dislocation.    Left foot:  1. Moderate diffuse degenerative changes as detailed above.     2. Remote healed fracture deformities of the 2nd and 3rd metatarsal diaphyses.    3. Mild plantar calcaneal spur.    4. Soft tissue swelling of the forefoot.    5. No acute fracture or dislocation.        MRI left hand 11/15/2018  1. Mild volar subluxation of the 3rd metacarpophalangeal joint with mild    degenerative changes and a small effusion.    2. Severe 1st carpometacarpal degenerative changes. Moderate 1st    interphalangeal degenerative changes       ASSESSMENT AND PLAN      Assessment/Plan:      ASSESSMENT:    1. Inflammatory osteoarthritis    2. Peripheral polyneuropathy    3. Primary osteoarthritis involving multiple joints    4. H/O calcium pyrophosphate deposition disease (CPPD)    5. High risk medication use        PLAN:   1.  Inflammatory osteoarthritis  Osteoarthritis/ Joint pain : Discussed about diagnosis and management of osteoarthritis. There are no FDA approved disease modifying agents. Goal is to control pain and increase mobility. Discussed the importance of wt loss, exercises, formal PT, joint braces/splints. First line of agent Tylenol arthritis, NSAIDs systemic and/ or topical,Cymbalta, pain meds, joint injections- steroids, and visco supplementaiton for knee OA. Also discussed about surgical options.  Continues to be symptomatic.  Most likely inflammatory osteoarthritis with peripheral neuropathy.  -RF, CCP negative, normal ESR and CRP.  Hand and foot x-rays with moderate to severe degenerative arthritis with CPPD  Unable to take oral NSAIDs due to CKD  Advised to take Tylenol 1000 mg 3 times a day, do not exceed 3 g daily  Continue diclofenac gel  Continue Plaquenil 200 mg daily  Continue leflunomide 20 mg daily and prednisone 5 mg daily  I will refer to occupational therapy    2.  Peripheral neuropathy  Blood work showed vitamin B1 and vitamin B6 deficiency.  She was placed on replacement.  Magnesium, B12, TSH, copper SPEP, hemoglobin A1c normal.    EMG with bilateral mixed  sensorimotor peripheral neuropathy.   Advised to continue Cymbalta 60 mg daily and gabapentin 600 mg at bedtime        3. H/O calcium pyrophosphate deposition disease (CPPD)  Work-up negative for secondary causes     Diagnosis Orders   1. Inflammatory osteoarthritis     2. Peripheral polyneuropathy     3. Primary osteoarthritis involving multiple joints  [place]   4. H/O calcium pyrophosphate deposition disease (CPPD)     5. High risk medication use       Recommend once yearly flu vaccine, pneumonia and shingles vaccine    The patient indicates understanding of these issues and agrees with the plan.      Return in about 3 months (around 01/03/2021).      The risks and benefits of my recommendations, as well as other treatment options, benefits and side effects werediscussed with the patient. All questions were answered.       ######################################################################    I thank you for giving me theopportunity to participate in [name] care. If you have any questions or concerns please feel free to contact me. I look forward to following  Akisha along with you.      Electronically signed by: Oletta Lamas, MD, MD, 10/03/2020 2:45 PM    Documentation  was done using voice recognition Editor, commissioning.  Every effort was made to ensure accuracy;however, inadvertent unintentional computerized transcription errors may be present.

## 2020-10-13 ENCOUNTER — Inpatient Hospital Stay: Admit: 2020-10-13 | Payer: MEDICARE | Primary: Family Medicine

## 2020-10-13 ENCOUNTER — Encounter

## 2020-10-13 ENCOUNTER — Encounter: Admit: 2020-10-13 | Discharge: 2020-10-13 | Payer: MEDICARE | Attending: Family Medicine | Primary: Family Medicine

## 2020-10-13 ENCOUNTER — Inpatient Hospital Stay: Payer: MEDICARE | Primary: Family Medicine

## 2020-10-13 DIAGNOSIS — R0609 Other forms of dyspnea: Secondary | ICD-10-CM

## 2020-10-13 DIAGNOSIS — I10 Essential (primary) hypertension: Secondary | ICD-10-CM

## 2020-10-13 DIAGNOSIS — R0600 Dyspnea, unspecified: Secondary | ICD-10-CM

## 2020-10-13 LAB — BASIC METABOLIC PANEL
Anion Gap: 17 — ABNORMAL HIGH (ref 3–16)
BUN: 21 mg/dL — ABNORMAL HIGH (ref 7–20)
CO2: 21 mmol/L (ref 21–32)
Calcium: 9.5 mg/dL (ref 8.3–10.6)
Chloride: 103 mmol/L (ref 99–110)
Creatinine: 1.7 mg/dL — ABNORMAL HIGH (ref 0.6–1.2)
GFR African American: 35 — AB (ref >60)
GFR Non-African American: 29 — AB (ref >60)
Glucose: 96 mg/dL (ref 70–99)
Potassium: 3.5 mmol/L (ref 3.5–5.1)
Sodium: 141 mmol/L (ref 136–145)

## 2020-10-13 LAB — LIPID PANEL
Cholesterol, Total: 241 mg/dL — ABNORMAL HIGH (ref 0–199)
HDL: 86 mg/dL — ABNORMAL HIGH (ref 40–60)
LDL Calculated: 132 mg/dL — ABNORMAL HIGH (ref <100)
Triglycerides: 117 mg/dL (ref 0–150)
VLDL Cholesterol Calculated: 23 mg/dL

## 2020-10-13 LAB — TSH: TSH: 6.72 u[IU]/mL — ABNORMAL HIGH (ref 0.27–4.20)

## 2020-10-13 NOTE — Patient Instructions (Signed)
Do get the mammogram and the chest xray   Continue the diet  See in November.

## 2020-10-13 NOTE — Progress Notes (Signed)
 Subjective:      Patient ID: Jodi Walls is a 80 y.o. female.    Chief Complaint   Patient presents with   . 6 Month Follow-Up     hypertension. thyroid, lipids - pt is fasting        Patient presents with:  6 Month Follow-Up: hypertension. thyroid, lipids - pt is fasting    She is here for the above and check   She is overall well   Still some doe    meds the same    Not noted other concerns but since covid no taste or smell    Date of Birth:  [birth year]    Date of Visit:  10/13/2020     -- Pcn (Penicillins) -- Swelling    --  Face and hands swelled   -- Triamterene      --  Severe stomach pain,SOB,severe cramps throughout             her body   -- Ace Inhibitors -- Other (See Comments)    --  cough   -- Atenolol  -- Other (See Comments)    --  Bradycardia (HR 40's)   -- Atorvastatin -- Other (See Comments)    --  Muscle aches   -- Zithromax (Azithromycin) -- Swelling   -- Codeine -- Nausea And Vomiting    Current Outpatient Medications:  predniSONE  (DELTASONE ) 5 MG tablet, , Disp: , Rfl:   leflunomide  (ARAVA ) 20 MG tablet, TAKE 1 TABLET BY MOUTH DAILY, Disp: 30 tablet, Rfl: 2  hydroxychloroquine  (PLAQUENIL ) 200 MG tablet, Take 1 tablet by mouth daily, Disp: 30 tablet, Rfl: 5  DULoxetine  (CYMBALTA ) 60 MG extended release capsule, Take 1 capsule by mouth daily, Disp: 90 capsule, Rfl: 1  levothyroxine  (SYNTHROID ) 75 MCG tablet, TAKE 1 TABLET BY MOUTH DAILY, Disp: 90 tablet, Rfl: 1  diclofenac  sodium (VOLTAREN ) 1 % GEL, APPLY 4 GRAMS TO LOWER EXTREMITY JOINTS AND 2 GRAMS TO UPPER EXTREMITY JOINTS FOUR TIMES DAILY AS NEEDED, Disp: 500 g, Rfl: 1  furosemide  (LASIX ) 40 MG tablet, Take 1 tablet by mouth daily, Disp: 30 tablet, Rfl: 2  potassium chloride  (KLOR-CON ) 10 MEQ extended release tablet, Take 10 mEq by mouth 2 times daily, Disp: , Rfl:   melatonin 3 MG TABS tablet, Take 3 mg by mouth nightly , Disp: , Rfl:   aspirin  81 MG tablet, Take 81 mg by mouth daily., Disp: , Rfl:   gabapentin  (NEURONTIN ) 300 MG capsule,  TAKE 2 CAPSULES BY MOUTH DAILY WITH SUPPER, Disp: 180 capsule, Rfl: 1    No current facility-administered medications for this visit.      ----------------------------                10/13/20                       1018         ----------------------------   BP:           122/80         Site:     Left Upper Arm     Position:     Sitting         Cuff Size:   Large Adult       Pulse:          84           Temp:    97 F (36.1 C)     TempSrc:  Temporal        Weight:  180 lb (81.6 kg)    Height: 4' 11.5 (1.511 m)  ----------------------------  Body mass index is 35.75 kg/m.     Wt Readings from Last 3 Encounters:  10/13/20 : 180 lb (81.6 kg)  10/03/20 : 179 lb (81.2 kg)  07/04/20 : 184 lb 6.4 oz (83.6 kg)    BP Readings from Last 3 Encounters:  10/13/20 : 122/80  10/03/20 : (!) 148/88  07/04/20 : 138/88        Review of Systems   Constitutional: Negative for chills and fever.   Cardiovascular: Negative for chest pain.   Gastrointestinal: Negative for abdominal pain, blood in stool, constipation and diarrhea.        Hx of loose stool intermittent  long time   Genitourinary: Negative for difficulty urinating.   Neurological: Negative for headaches.        Will occ have a headache       Objective:   Physical Exam  Constitutional:       General: She is not in acute distress.     Appearance: Normal appearance. She is well-developed. She is not ill-appearing or diaphoretic.   Cardiovascular:      Rate and Rhythm: Normal rate and regular rhythm.      Heart sounds: Normal heart sounds. No murmur heard.  No friction rub. No gallop.    Pulmonary:      Effort: Pulmonary effort is normal. No tachypnea, accessory muscle usage or respiratory distress.      Breath sounds: Normal breath sounds. No decreased breath sounds, wheezing, rhonchi or rales.   Chest:   Breasts:      Right: No supraclavicular adenopathy.      Left: No supraclavicular adenopathy.       Lymphadenopathy:      Cervical:  No cervical adenopathy.      Upper Body:      Right upper body: No supraclavicular adenopathy.      Left upper body: No supraclavicular adenopathy.   Skin:     General: Skin is warm and dry.      Coloration: Skin is not pale.   Neurological:      Mental Status: She is alert.         Assessment:        Diagnosis Orders   1. Essential hypertension, benign  TSH    Basic Metabolic Panel    Lipid Panel   2. Other hyperlipidemia  TSH    Lipid Panel   3. Acquired hypothyroidism  TSH   4. DOE (dyspnea on exertion)  XR CHEST (2 VW)   5. Breast cancer screening by mammogram  MAM DIGITAL SCREEN W OR WO CAD BILATERAL       She is overall well  She does not want to treat the lipid   She did not want to see the lung doctor for the sob she has had since the covid  She is seeing the cardiologist for the sob as well   She has not done the mammogram   Still with rheumatologist and seen on 10/03/20  Cardiology visit noted on 05/03/20      Plan:      Do get the mammogram and the chest xray   Continue the diet  See in November.        Orysia Blas, MD

## 2020-10-25 ENCOUNTER — Encounter

## 2020-10-25 MED ORDER — LEVOTHYROXINE SODIUM 100 MCG PO TABS
100 MCG | ORAL_TABLET | Freq: Every day | ORAL | 1 refills | Status: DC
Start: 2020-10-25 — End: 2021-04-24

## 2020-11-10 MED ORDER — NIRMATRELVIR & RITONAVIR 20 X 150 MG & 10 X 100MG PO TBPK
20 | ORAL_TABLET | ORAL | 0 refills | Status: AC
Start: 2020-11-10 — End: 2020-11-15

## 2020-11-10 NOTE — Telephone Encounter (Signed)
Pt would like to know if she could be prescribed something     Sx's    Not feeling well   Home test - positive for covid   No fever   Sore throat   Body aches     Pl advise.   [place]

## 2020-11-10 NOTE — Telephone Encounter (Signed)
paxlovid sent in  Fluids   Tylenol  If worse call or urgent care/ER

## 2020-11-10 NOTE — Telephone Encounter (Signed)
When did symptoms start?  Any breathing problems

## 2020-11-10 NOTE — Telephone Encounter (Signed)
[phone] Jodi Walls advised and states her symptoms started yesterday morning. She denies any breathing problems but states she has a cough now. She also has earache, back of neck hurting and a low grade fever of 99.9.  Please advise.

## 2020-11-10 NOTE — Telephone Encounter (Signed)
Jodi Walls advised and verbalized understanding.

## 2020-11-20 ENCOUNTER — Encounter

## 2020-11-21 NOTE — Telephone Encounter (Signed)
.  Last visit-10/03/20  Lab-09/20/20  Next visit-01/03/21  Last refill -08/16/20

## 2020-11-22 MED ORDER — LEFLUNOMIDE 20 MG PO TABS
20 MG | ORAL_TABLET | Freq: Every day | ORAL | 2 refills | Status: DC
Start: 2020-11-22 — End: 2021-01-03

## 2020-12-07 ENCOUNTER — Encounter

## 2020-12-08 LAB — CBC WITH AUTO DIFFERENTIAL
Basophils %: 0.9 %
Basophils Absolute: 0 10*3/uL (ref 0.0–0.2)
Eosinophils %: 5.1 %
Eosinophils Absolute: 0.3 10*3/uL (ref 0.0–0.6)
Hematocrit: 35.1 % — ABNORMAL LOW (ref 36.0–48.0)
Hemoglobin: 11.3 g/dL — ABNORMAL LOW (ref 12.0–16.0)
Lymphocytes %: 25.2 %
Lymphocytes Absolute: 1.3 10*3/uL (ref 1.0–5.1)
MCH: 28.5 pg (ref 26.0–34.0)
MCHC: 32.3 g/dL (ref 31.0–36.0)
MCV: 88.2 fL (ref 80.0–100.0)
MPV: 10.5 fL (ref 5.0–10.5)
Monocytes %: 9.7 %
Monocytes Absolute: 0.5 10*3/uL (ref 0.0–1.3)
Neutrophils %: 59.1 %
Neutrophils Absolute: 3.2 10*3/uL (ref 1.7–7.7)
Platelets: 191 10*3/uL (ref 135–450)
RBC: 3.98 M/uL — ABNORMAL LOW (ref 4.00–5.20)
RDW: 14.6 % (ref 12.4–15.4)
WBC: 5.3 10*3/uL (ref 4.0–11.0)

## 2020-12-09 NOTE — Other (Unsigned)
Subjective  Jodi Walls  1940/06/04  No chief complaint on file.    Jodi Walls returns to the [facility] in [place] for six-month checkup.  Prior office notes, medical records and  diagnostic studies were reviewed in conjunction with the encounter.    In July, she was diagnosed with COVID for the second time and treated with  Paxlovid.    She continues to complain of shortness of breath which has been a chronic  complaint and is unchanged.    Patient's Medications  Current Medications   CHOLECALCIFEROL (VITAMIN D-3) 10 MCG (400 UNIT) TABLET    Take 400 Units by  mouth daily.      Order Dose: 400 Units   CYANOCOBALAMIN 100 MCG TABLET    Take 100 mcg by mouth daily.      Order Dose: 100 mcg   DULOXETINE (CYMBALTA) 60 MG CAPSULE, DELAYED RELEASE(E.C.)    Take 60 mg by  mouth daily.      Order Dose: 60 mg   FUROSEMIDE (LASIX) 40 MG TABLET    Take 40 mg by mouth daily. PRN      Order Dose: 40 mg   GABAPENTIN (NEURONTIN) 300 MG CAPSULE    Take 300 mg by mouth 2 times daily.      Order Dose: 300 mg   HYDROXYCHLOROQUINE (PLAQUENIL) 200 MG TABLET    Take 200 mg by mouth daily.      Order Dose: 200 mg   LEFLUNOMIDE (ARAVA) 20 MG TABLET    Take 20 mg by mouth daily.      Order Dose: 20 mg   LEVOTHYROXINE (SYNTHROID) 75 MCG PO TABLET    daily      Order Dose: --   MELATONIN 3 MG TABLET    Take 3 mg by mouth nightly at bedtime.      Order Dose: 3 mg   POTASSIUM CHLORIDE (KDUR) 10 MEQ PO TABLET    Take 10 mEq by mouth 2 times  daily. daily      Order Dose: 10 mEq   PREDNISONE (DELTASONE) 5 MG TABLET    Take 5 mg by mouth daily.      Order Dose: 5 mg                Review of Systems        There were no vitals filed for this visit.  There is no height or weight on file to calculate BMI.    Physical Exam  Vitals reviewed.  Constitutional:     General: She is not in acute distress.     Appearance: Normal appearance. She is not diaphoretic.  Eyes:     General: Lids are normal.      Conjunctiva/sclera: Conjunctivae normal.     Pupils: Pupils are equal, round, and reactive to light.  Neck:     Thyroid: No thyroid mass or thyromegaly.     Vascular: Normal carotid pulses. No carotid bruit, hepatojugular reflux or  JVD.     Trachea: Trachea and phonation normal.  Cardiovascular:     Rate and Rhythm: Normal rate and regular rhythm.  No extrasystoles are  present.     Chest Wall: PMI is not displaced.     Pulses: Normal pulses.     Heart sounds: Normal heart sounds, S1 normal and S2 normal. No murmur   heard.   No systolic murmur is present.   No diastolic murmur is present.  No friction rub. No gallop. No S3 or S4 sounds.  Pulmonary:     Effort: Pulmonary effort is normal. No respiratory distress.     Breath sounds: Normal breath sounds. No wheezing.  Abdominal:     General: Bowel sounds are normal.     Tenderness: There is no abdominal tenderness.  Musculoskeletal:     General: Normal range of motion.     Cervical back: Full passive range of motion without pain, normal range of  motion and neck supple.  Skin:     General: Skin is warm and dry.     Findings: No abrasion, bruising, burn, ecchymosis or rash.     Nails: There is no clubbing.  Neurological:     Mental Status: She is alert and oriented to person, place, and time.     Deep Tendon Reflexes: Reflexes are normal and symmetric.  Psychiatric:     Judgment: Judgment normal.                IMPRESSION:        ICD-10-CM  1. Agatston coronary artery calcium score less than 100  R93.1  2. LBBB (left bundle branch block)  I44.7  3. 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 Hyperlipidemia, unspecified hyperlipidemia type  E78.5      PLAN AND RECOMMENDATION:      1.  Left bundle branch block-This has been a stable finding on her surface   ECG.  She is otherwise asymptomatic.  Previous transthoracic echocardiogram showed a  structurally normal heart and preserved left ventricular systolic function.  SPECT MPI October 2018 showed no reversible perfusion defect  concerning for  ischemia.  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  There is a   low  risk of progression to higher degrees of potentially symptomatic heart block.     2.  Coronary calcium score less than 100     3.  Systolic heart failure, unspecified-Her recent echocardiogram calculated  left ventricular ejection fraction 46% with global hypokinesis and paradoxical  septal motion due to her intraventricular conduction delay, grade 1 diastolic  dysfunction but no evidence of significant valvular heart disease.  Of note,  prior transthoracic echocardiogram visually estimated her ejection fraction  55-60%.    Although, her complaints of dyspnea seem disproportionate to the degree of   left  ventricular impairment.  A recent BNP was less than 100.  I think her symptoms  are most likely attributable to deconditioning and advanced age.     Return to clinic in one year.     --- Please note that I used  voice recognition software to generate the above  note. Occasionally words are mistranscribed despite my best efforts to edit  errors. ---

## 2021-01-03 ENCOUNTER — Encounter

## 2021-01-03 ENCOUNTER — Ambulatory Visit: Admit: 2021-01-03 | Discharge: 2021-01-03 | Payer: MEDICARE | Attending: Internal Medicine | Primary: Family Medicine

## 2021-01-03 DIAGNOSIS — M0600 Rheumatoid arthritis without rheumatoid factor, unspecified site: Secondary | ICD-10-CM

## 2021-01-03 MED ORDER — SULFASALAZINE 500 MG PO TABS
500 MG | ORAL_TABLET | ORAL | 1 refills | Status: DC
Start: 2021-01-03 — End: 2021-02-28

## 2021-01-03 NOTE — Patient Instructions (Signed)
Start sulfasalazine 500 mg twice a day   Labs    Restart prednisone and cymbalta   Stop arava and plaquenil

## 2021-01-03 NOTE — Progress Notes (Signed)
01/03/2021     Patient Name: Jodi Walls  DOB: April 15, 1941  MEDICAL RECORD [id number]    MEDICATIONS  Current Outpatient Medications   Medication Sig Dispense Refill    sulfaSALAzine (AZULFIDINE) 500 MG tablet Take 1 tablet twice a day 60 tablet 1    levothyroxine (SYNTHROID) 100 MCG tablet Take 1 tablet by mouth Daily 90 tablet 1    predniSONE (DELTASONE) 5 MG tablet       gabapentin (NEURONTIN) 300 MG capsule TAKE 2 CAPSULES BY MOUTH DAILY WITH SUPPER 180 capsule 1    DULoxetine (CYMBALTA) 60 MG extended release capsule Take 1 capsule by mouth daily 90 capsule 1    diclofenac sodium (VOLTAREN) 1 % GEL APPLY 4 GRAMS TO LOWER EXTREMITY JOINTS AND 2 GRAMS TO UPPER EXTREMITY JOINTS FOUR TIMES DAILY AS NEEDED 500 g 1    furosemide (LASIX) 40 MG tablet Take 1 tablet by mouth daily 30 tablet 2    potassium chloride (KLOR-CON) 10 MEQ extended release tablet Take 10 mEq by mouth 2 times daily      melatonin 3 MG TABS tablet Take 3 mg by mouth nightly       aspirin 81 MG tablet Take 81 mg by mouth daily.       No current facility-administered medications for this visit.       ALLERGIES  Allergies   Allergen Reactions    Pcn [Penicillins] Swelling     Face and hands swelled    Triamterene      Severe stomach pain,SOB,severe cramps throughout her body    Ace Inhibitors Other (See Comments)     cough    Atenolol Other (See Comments)     Bradycardia (HR 40's)    Atorvastatin Other (See Comments)     Muscle aches    Zithromax [Azithromycin] Swelling    Codeine Nausea And Vomiting         Comments  No specialty comments available.    Background history:  KYMBERLYN Walls is a 80 y.o. female breast cancer status post lumpectomy, hypertension, osteoarthritis, hypothyroidism, CKD 3 who is being seen for follow up evaluation of  joint pain.  She is complaining of pain in her hands and feet.  We started 2 years ago.  Symptoms have progressively worsened over the past 1 year.  She has pain on daily basis, 10 out of 10.  She is swelling  in her knuckles and feet.  She has morning stiffness lasting for 1 hour.  She has difficulty opening doorknobs and jars.  She has developed weakness in her hands and is dropping things.  She takes Tylenol 1000 mg twice a day with minimal relief.  She also takes Aleve as needed.  She is unable to take oral NSAIDs due to CKD 3.  She has a history of left knee replacement.  She has osteoarthritis in the right knee as well.  Right knee cracks and pops.  Pain in the right knee gets worse going up and down the steps and standing from the sitting position.  She had a trauma to the left hand in October 2019.  Since then left third MCP joint stays swollen and painful.  She had x-ray of the left hand which did not show any fracture.  She denies psoriasis, inflammatory bowel disease, inflammatory back pain, dactylitis, enthesitis, tenosynovitis or uveitis.  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  Her mother had arthritis  She had  work-up with PCP which showed negative RF, ESR and normal TSH.    Interim history: She presents for follow-up of inflammatory arthritis, CPPD.   She is off of Plaquenil and leflunomide.  She went to hold leflunomide due to diarrhea and Plaquenil due to vision changes.  She is scheduled for cataract surgery.  She also stopped taking duloxetine and prednisone, she did not notice any significant improvement.  She continues to take gabapentin 600 mg at bedtime.  She continues to complain of pain in the hands and feet associated with tingling and numbness.  She also has a chronic low back pain secondary to degenerative disc disease.  She has seen pain specialist in the past.   She has peripheral neuropathy.    She denies any side effects with the medications.  Work-up came back negative for rheumatoid arthritis.  Hand and foot x-rays showed moderate to severe degenerative changes with CPPD.  She denies any pseudogout flareups.  Work-up negative for secondary  causes of pseudogout.     HPI  Review of Systems    REVIEW OF SYSTEMS:   Constitutional: No unanticipated weight loss or fevers.   Integumentary: No rash, photosensitivity, malar rash, livedo reticularis, alopecia and Raynaud's symptoms, sclerodactyly, skin tightening  Eyes: negative for visual disturbance and persistent redness, discharge from eyes   ENT: - No tinnitus, loss of hearing, vertigo, or recurrent ear infections.  - No history of nasal/oral ulcers.  - No history of dry eyes/dry mouth  Cardiovascular: No history of pericarditis, chest pain or murmur or palpitations  Respiratory: No shortness of breath, cough or history of interstitial lung disease.No history of pleurisy. No history of tuberculosis or atypical infections.  Gastrointestinal: No history of heart burn, dysphagia or esophageal dysmotility. No change in bowel habits or any inflammatory bowel disease.  Genitourinary: No history miscarriages.  Hematologic/Lymphatic: No abnormal bruising or bleeding, blood clots or swollen lymph nodes.  Neurological: No history of headaches, seizure or focal weakness. No history of neuropathies, paresthesias or hyperesthesias, facial droop, diplopia  Psychiatric: No history of bipolar disease, anxiety, depression  Endocrine: Denies any polyuria, polydipsia and osteoporosis  Allergic/Immunologic: No nasal congestion or hives.        I have reviewed patients Past medical History, Social History and Family History as mentioned in her chart and this remains unchanged fromprevious.    Past Medical History:   Diagnosis Date    Anemia     Arthritis     Cancer ([place]) 1995    hx of breast cancer--no chemo or radiation needed    GERD (gastroesophageal reflux disease)     Hx of blood clots 2001    after gallbladder surgery--blood clot in arm where IV had been    Hypothyroidism     Kidney failure     stage 3    PONV (postoperative nausea and vomiting)     Wears glasses     reading     Past Surgical History:   Procedure  Laterality Date    APPENDECTOMY      ARTHROPLASTY Left 12/25/2018    LEFT MIDDLE FINGER METACARPO-PHALANGEAL JOINT SYNOVECTOMY, LIGAMENT REBALANCING, EXTENSOR TENDON CENTRALIZATION AND SILICONE METACARPOPHALANGEAL JOINT ARTHROPLASTY performed by Delene Loll, MD at [place]      r/t to herniated disc in lower back, neck area--screws and rods in place    BREAST BIOPSY      left breast biopsy    [place]  right mastectomy    CHOLECYSTECTOMY  2001    COLONOSCOPY  04/12/2015    Esophagogastroduodenoscopy with biopsy, Colonoscopy, diagnostic. no findings except for diverticula and check in 10 years    HYSTERECTOMY (CERVIX STATUS UNKNOWN)      TAH-BSO. no  cancer    JOINT REPLACEMENT Left     left total knee replacement    LIPOMA RESECTION      right side below right breast    MASTECTOMY, RADICAL  1995    UPPER GASTROINTESTINAL ENDOSCOPY  04/12/2015    and colonoscopy. hiatal hernia and gastritis     Social History     Socioeconomic History    Marital status: Widowed     Spouse name: Not on file    Number of children: Not on file    Years of education: Not on file    Highest education level: Not on file   Occupational History    Not on file   Tobacco Use    Smoking status: Never    Smokeless tobacco: Never   Vaping Use    Vaping Use: Never used   Substance and Sexual Activity    Alcohol use: Yes     Comment: social-rare. once a year    Drug use: No    Sexual activity: Not Currently   Other Topics Concern    Not on file   Social History Narrative    Not on file     Social Determinants of Health     Financial Resource Strain: Not on file   Food Insecurity: Not on file   Transportation Needs: Not on file   Physical Activity: Not on file   Stress: Not on file   Social Connections: Not on file   Intimate Partner Violence: Not on file   Housing Stability: Not on file     Family History   Problem Relation Age of Onset    Cancer Mother         lung     Cancer Father         bladder    Heart Disease  Father     High Blood Pressure Father     Cancer Maternal Aunt         ovarian    Cancer Paternal Cousin         ovarian       Vitals:    01/03/21 1412   BP: 122/80   Site: Left Upper Arm   Position: Sitting   Cuff Size: Large Adult   Weight: 180 lb (81.6 kg)     Physical Exam  Constitutional:  Well developed, well nourished, no acute distress, non-toxic appearance   Musculoskeletal:    RIGHT  Swell  Tender  ROM  LEFT  Swell  Tender  ROM    DIP2  0  0   [place]  0  0   [place]den   DIP3  0  0   [place]  0  0   [place]   DIP4  0  0   [place]  0  0   [place]   DIP5  0  0   [place]  0  0   [place]   PIP1  0  0   bony change  0  0   bony change   PIP2  ++ ++ FULL   0  0  FULL    PIP3  0  0  FULL   0  0  FULL  PIP4  + + FULL   + + FULL    PIP5  + + FULL   +  + FULL    MCP1  0  0   bony change  0  0   bony change   MCP2  + +  bony change++  0  0   bony change   MCP3  + +  bony change  0  0   bony change+++   MCP4  0  0  FULL   0  0  FULL    MCP5  0  0  FULL   0  0  FULL    Wrist  0  0  FULL   0  0  FULL    Elbow  0  0  FULL   0  0  FULL    Shouldr  0  0  FULL   0  0  FULL    Hip  0  0  FULL   0  0  FULL    Knee  0  0  CREPITUS/VARUS   0  0  TKR   Ankle  0  0  FULL   0  0  FULL    MTP1  0  + FULL   0  + FULL    MTP2  0  + FULL   0  +  FULL    MTP3  0  + FULL   0  +  FULL    MTP4  0  + FULL   0  +  FULL    MTP5  0  + FULL   0  +  FULL    IP1  0  0  FULL   0  0  FULL    IP2  0  0  FULL   0  0  FULL    IP3  0  0  FULL   0  0  FULL    IP4  0  0  FULL   0  0  FULL    IP5  0  0  FULL   0 0 FULL     Squaring and bony enlargement of bilateral CMC joints  Ambulates without assistance, normal gait  Neck: Full ROM, no tenderness,supple   Back-  Tenderness++  Eyes:  PERRL, extra ocular movements intact, conjunctiva normal   HEENT:  Atraumatic, normocephalic, external ears normal, oropharynx moist, no pharyngeal exudates.   Respiratory:  No respiratory distress  GI:  Soft, nondistended, normal bowel sounds, nontender,  noorganomegaly, no mass, no rebound, no guarding   GU:  No costovertebral angle tenderness   Integument:  Well hydrated, no rash or telangiectasias  Lymphatic:  No lymphadenopathy noted   Neurologic:   Alert & oriented x 3, CN 2-12 normal, no focal deficits noted. Sensations Intact. Muscle strength 5/5 proximallyand distally in upper and lower extremities.   Psychiatric:  Speech and behavior appropriate           LABS AND IMAGING  Outside data reviewed and in HPI    Lab Results   Component Value Date/Time    WBC 5.3 12/07/2020 02:13 PM    RBC 3.98 12/07/2020 02:13 PM    RBC 4.72 10/26/2015 11:32 AM    HGB 11.3 12/07/2020 02:13 PM    HCT 35.1 12/07/2020 02:13 PM    PLT 191 12/07/2020 02:13 PM    MCV 88.2 12/07/2020 02:13 PM  MCH 28.5 12/07/2020 02:13 PM    MCHC 32.3 12/07/2020 02:13 PM    RDW 14.6 12/07/2020 02:13 PM    SEGSPCT 60.4 02/02/2010 12:26 PM    LYMPHOPCT 25.2 12/07/2020 02:13 PM    LYMPHOPCT 24.7 10/26/2015 11:32 AM    MONOPCT 9.7 12/07/2020 02:13 PM    EOSPCT 4.3 02/02/2010 12:26 PM    BASOPCT 0.9 12/07/2020 02:13 PM    MONOSABS 0.5 12/07/2020 02:13 PM    LYMPHSABS 1.3 12/07/2020 02:13 PM    EOSABS 0.3 12/07/2020 02:13 PM    BASOSABS 0.0 12/07/2020 02:13 PM    DIFFTYPE Auto 02/02/2010 12:26 PM       Chemistry        Component Value Date/Time    NA 141 10/13/2020 1129    K 3.5 10/13/2020 1129    CL 103 10/13/2020 1129    CO2 21 10/13/2020 1129    BUN 21 (H) 10/13/2020 1129    CREATININE 1.7 (H) 10/13/2020 1129        Component Value Date/Time    CALCIUM 9.5 10/13/2020 1129    ALKPHOS 108 06/24/2019 0933    AST 19 09/20/2020 1526    ALT 9 (L) 09/20/2020 1526    BILITOT 0.5 06/24/2019 0933          Lab Results   Component Value Date    SEDRATE 24 06/24/2019     Lab Results   Component Value Date    CRP <3.0 06/24/2019     Lab Results   Component Value Date/Time    ANA Negative 11/12/2018 10:14 AM     Lab Results   Component Value Date/Time    RF <10.0 10/24/2018 02:21 PM     Lab Results   Component Value  Date/Time    ANA Negative 11/12/2018 10:14 AM     No results found for: DSDNAG, DSDNAIGGIFA  No results found for: SSAROAB, SSALAAB  No results found for: SMAB, RNPAB  No results found for: CENTABIGG  No results found for: C3, C4, ACE  Lab Results   Component Value Date/Time    VITD25 36.9 11/12/2018 10:14 AM     No results found for: LABURIC, URICACID  Lab Results   Component Value Date    VITAMINB12 1860 (H) 06/19/2019     Lab Results   Component Value Date    TSH 6.72 (H) 10/13/2020     Lab Results   Component Value Date    VITD25 36.9 11/12/2018       Hand and foot x-rays 11/11/2018  FINDINGS:    Right hand:         Osseous alignment is normal.         Severe degenerative changes of the 1st CMC joint.  Mild degenerative changes    of the DIP joints and 1st IP joint.  Chondrocalcinosis of the TFCC.         No marginal erosions are identified.  No acute fracture or gross dislocation    is seen.         No focal soft tissue swelling is evident.         Left hand:         Osseous alignment is normal.         Mild degenerative changes of the DIP joints, 1st MCP joint and triscaphe    joint.  Mild to moderate degenerative change of the 1st [facility] joint.    Chondrocalcinosis of the TFCC.  No marginal erosions are identified.  No acute fracture or gross dislocation    is seen.         No focal soft tissue swelling is evident.         Right foot:         Osseous alignment is normal.  Mild to moderate degenerative changes of the    TMT joints primarily at the 1st TMT joint.         Moderate plantar calcaneal spur.  Soft tissue swelling at the right forefoot.    Calcification along the plantar fascia.  No marginal erosions are identified.    No acute fracture or gross dislocation is seen.         The medial and middle cuneiforms demonstrate proper alignment with the base    of the 1st and 2nd metatarsals respectively.         No tibiotalar joint effusion is seen.         Boehler's angle is maintained.         Left foot:          Osseous alignment is normal.         Moderate degenerative changes of the midfoot and tarsal metatarsal joints.    Remote healed fracture deformities of the 2nd 3rd metatarsal diaphyses.  Mild    plantar calcaneal spur.  Calcification of the plantar fascia.  Os trigonum    variant.  Soft tissue swelling of the left forefoot.  No marginal erosions    are identified.  No acute fracture or gross dislocation is seen.         The medial and middle cuneiforms demonstrate proper alignment with the base    of the 1st and 2nd metatarsals respectively.         No tibiotalar joint effusion is seen.         Boehler's angle is maintained.              Impression    Right hand:         1. Osteoarthrosis which is severe at the 1st [facility] joint.  CPPD.    2. No acute fracture or dislocation.    Left hand:         1. Mild to moderate osteoarthrosis.  CPPD.    2. No acute fracture or dislocation.    Right foot:         1. Mild-to-moderate degenerative changes as detailed above.  Moderate plantar    calcaneal spur.    2. Soft tissue swelling of the right forefoot.    3. No acute fracture or dislocation.    Left foot:         1. Moderate diffuse degenerative changes as detailed above.    2. Remote healed fracture deformities of the 2nd and 3rd metatarsal diaphyses.    3. Mild plantar calcaneal spur.    4. Soft tissue swelling of the forefoot.    5. No acute fracture or dislocation.        MRI left hand 11/15/2018  1. Mild volar subluxation of the 3rd metacarpophalangeal joint with mild    degenerative changes and a small effusion.    2. Severe 1st carpometacarpal degenerative changes.  Moderate 1st    interphalangeal degenerative changes       ASSESSMENT AND PLAN      Assessment/Plan:      ASSESSMENT:    1. Seronegative rheumatoid arthritis ([place])    2.  Peripheral polyneuropathy    3. Primary osteoarthritis involving multiple joints    4. H/O calcium pyrophosphate deposition disease (CPPD)    5. High risk medication use        PLAN:    1.  Inflammatory arthritis-most likely seronegative rheumatoid arthritis with component of osteoarthritis contributing to the joint symptoms.  -RF, CCP negative, normal ESR and CRP.  Hand and foot x-rays with moderate to severe degenerative arthritis with CPPD  Unable to take oral NSAIDs due to CKD  Continues to be symptomatic with active synovitis on joint exam  Advised to take Tylenol 1000 mg 3 times a day, do not exceed 3 g daily  Continue diclofenac gel  Discontinue Plaquenil due to vision changes and discontinue leflunomide due to diarrhea  Start sulfasalazine 500 mg twice a day and restart prednisone 5 mg daily      2.  Peripheral neuropathy  Magnesium, B12, TSH, copper SPEP, hemoglobin A1c normal.  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   Advised to continue Cymbalta 60 mg daily and gabapentin 600 mg at bedtime    3.  High risk medication use  Sulfasalazine can cause live function abnormality, bone marrow toxicity, nausea and need to check the blood work every month for first 3 months and then every 2 months and were explained to the patient.        4. H/O calcium pyrophosphate deposition disease (CPPD)  Work-up negative for secondary causes     Diagnosis Orders   1. Seronegative rheumatoid arthritis (HCC)  sulfaSALAzine (AZULFIDINE) 500 MG tablet    GLUCOSE 6 PHOSPHATE DEHYDROGENASE      2. Peripheral polyneuropathy        3. Primary osteoarthritis involving multiple joints        4. H/O calcium pyrophosphate deposition disease (CPPD)        5. High risk medication use  ALT    AST    CBC with Auto Differential    Creatinine        Recommend once yearly flu vaccine, pneumonia and shingles vaccine    The patient indicates understanding of these issues and agrees with the plan.      Return in about 8 weeks (around 02/28/2021).      The risks and benefits of my recommendations, as well as other treatment options, benefits and side effects werediscussed with the patient. All  questions were answered.       ######################################################################    I thank you for giving me theopportunity to participate in [name] care. If you have any questions or concerns please feel free to contact me. I look forward to following  [place] along with you.      Electronically signed by: Oletta Lamas, MD, MD, 01/03/2021 2:37 PM    Documentation was done using voice recognition dragon software.  Every effort was made to ensure accuracy;however, inadvertent unintentional computerized transcription errors may be present.

## 2021-01-04 LAB — CBC WITH AUTO DIFFERENTIAL
Basophils %: 0.9 %
Basophils Absolute: 0.1 10*3/uL (ref 0.0–0.2)
Eosinophils %: 4 %
Eosinophils Absolute: 0.2 10*3/uL (ref 0.0–0.6)
Hematocrit: 36.7 % (ref 36.0–48.0)
Hemoglobin: 11.8 g/dL — ABNORMAL LOW (ref 12.0–16.0)
Lymphocytes %: 27.7 %
Lymphocytes Absolute: 1.7 10*3/uL (ref 1.0–5.1)
MCH: 28.8 pg (ref 26.0–34.0)
MCHC: 32.1 g/dL (ref 31.0–36.0)
MCV: 89.9 fL (ref 80.0–100.0)
MPV: 10.4 fL (ref 5.0–10.5)
Monocytes %: 6.7 %
Monocytes Absolute: 0.4 10*3/uL (ref 0.0–1.3)
Neutrophils %: 60.7 %
Neutrophils Absolute: 3.6 10*3/uL (ref 1.7–7.7)
Platelets: 247 10*3/uL (ref 135–450)
RBC: 4.08 M/uL (ref 4.00–5.20)
RDW: 14.6 % (ref 12.4–15.4)
WBC: 6 10*3/uL (ref 4.0–11.0)

## 2021-01-04 LAB — TSH: TSH: 1.31 u[IU]/mL (ref 0.27–4.20)

## 2021-01-04 LAB — CREATININE
Creatinine: 1.8 mg/dL — ABNORMAL HIGH (ref 0.6–1.2)
GFR African American: 33 — AB (ref >60)
GFR Non-African American: 27 — AB (ref >60)

## 2021-01-04 LAB — ALT: ALT: 13 U/L (ref 10–40)

## 2021-01-04 LAB — T4, FREE: T4 Free: 1.3 ng/dL (ref 0.9–1.8)

## 2021-01-04 LAB — AST: AST: 21 U/L (ref 15–37)

## 2021-01-06 LAB — GLUCOSE 6 PHOSPHATE DEHYDROGENASE: G-6-PD, Quant: 14.4 U/g{Hb} (ref 9.9–16.6)

## 2021-01-30 ENCOUNTER — Encounter

## 2021-01-30 MED ORDER — PREDNISONE 5 MG PO TABS
5 MG | ORAL_TABLET | ORAL | 0 refills | Status: DC
Start: 2021-01-30 — End: 2021-06-21

## 2021-01-30 NOTE — Telephone Encounter (Signed)
Last visit 01/03/21  Labs 01/03/21  Next visit 02/28/21  Last fill 09/28/20

## 2021-02-10 ENCOUNTER — Ambulatory Visit: Payer: MEDICARE | Primary: Family Medicine

## 2021-02-13 ENCOUNTER — Encounter

## 2021-02-13 LAB — CBC WITH AUTO DIFFERENTIAL
Basophils %: 1.1 %
Basophils Absolute: 0.1 10*3/uL (ref 0.0–0.2)
Eosinophils %: 5.1 %
Eosinophils Absolute: 0.3 10*3/uL (ref 0.0–0.6)
Hematocrit: 34.5 % — ABNORMAL LOW (ref 36.0–48.0)
Hemoglobin: 11.1 g/dL — ABNORMAL LOW (ref 12.0–16.0)
Lymphocytes %: 20 %
Lymphocytes Absolute: 1.3 10*3/uL (ref 1.0–5.1)
MCH: 28.7 pg (ref 26.0–34.0)
MCHC: 32.1 g/dL (ref 31.0–36.0)
MCV: 89.7 fL (ref 80.0–100.0)
MPV: 9.6 fL (ref 5.0–10.5)
Monocytes %: 6.8 %
Monocytes Absolute: 0.4 10*3/uL (ref 0.0–1.3)
Neutrophils %: 67 %
Neutrophils Absolute: 4.3 10*3/uL (ref 1.7–7.7)
Platelets: 194 10*3/uL (ref 135–450)
RBC: 3.85 M/uL — ABNORMAL LOW (ref 4.00–5.20)
RDW: 13.5 % (ref 12.4–15.4)
WBC: 6.5 10*3/uL (ref 4.0–11.0)

## 2021-02-13 LAB — ALT: ALT: 11 U/L (ref 10–40)

## 2021-02-13 LAB — CREATININE
Creatinine: 1.6 mg/dL — ABNORMAL HIGH (ref 0.6–1.2)
Est, Glom Filt Rate: 32 — AB (ref >60)

## 2021-02-13 LAB — AST: AST: 16 U/L (ref 15–37)

## 2021-02-14 ENCOUNTER — Inpatient Hospital Stay: Admit: 2021-02-14 | Payer: MEDICARE | Primary: Family Medicine

## 2021-02-14 DIAGNOSIS — M47816 Spondylosis without myelopathy or radiculopathy, lumbar region: Secondary | ICD-10-CM

## 2021-02-28 ENCOUNTER — Ambulatory Visit: Admit: 2021-02-28 | Discharge: 2021-02-28 | Payer: MEDICARE | Attending: Internal Medicine | Primary: Family Medicine

## 2021-02-28 ENCOUNTER — Encounter

## 2021-02-28 DIAGNOSIS — M0600 Rheumatoid arthritis without rheumatoid factor, unspecified site: Secondary | ICD-10-CM

## 2021-02-28 MED ORDER — ENBREL MINI 50 MG/ML SC SOCT
50 MG/ML | SUBCUTANEOUS | 5 refills | Status: DC
Start: 2021-02-28 — End: 2021-04-11

## 2021-02-28 NOTE — Progress Notes (Signed)
02/28/2021     Patient Name: Jodi Walls  DOB: 1940/11/06  MEDICAL RECORD [id number]    MEDICATIONS  Current Outpatient Medications   Medication Sig Dispense Refill    HYDROcodone-acetaminophen (NORCO) 5-325 MG per tablet TAKE 1 TABLET BY MOUTH TWICE DAILY FOR 15 DAYS      Etanercept (ENBREL MINI) 50 MG/ML SOCT Inject 50 mg into the skin once a week 4 each 5    predniSONE (DELTASONE) 5 MG tablet TAKE 1 TABLET BY MOUTH DAILY 90 tablet 0    levothyroxine (SYNTHROID) 100 MCG tablet Take 1 tablet by mouth Daily 90 tablet 1    gabapentin (NEURONTIN) 300 MG capsule TAKE 2 CAPSULES BY MOUTH DAILY WITH SUPPER 180 capsule 1    DULoxetine (CYMBALTA) 60 MG extended release capsule Take 1 capsule by mouth daily 90 capsule 1    diclofenac sodium (VOLTAREN) 1 % GEL APPLY 4 GRAMS TO LOWER EXTREMITY JOINTS AND 2 GRAMS TO UPPER EXTREMITY JOINTS FOUR TIMES DAILY AS NEEDED 500 g 1    furosemide (LASIX) 40 MG tablet Take 1 tablet by mouth daily 30 tablet 2    potassium chloride (KLOR-CON) 10 MEQ extended release tablet Take 10 mEq by mouth 2 times daily      melatonin 3 MG TABS tablet Take 3 mg by mouth nightly       aspirin 81 MG tablet Take 81 mg by mouth daily.       No current facility-administered medications for this visit.       ALLERGIES  Allergies   Allergen Reactions    Pcn [Penicillins] Swelling     Face and hands swelled    Triamterene      Severe stomach pain,SOB,severe cramps throughout her body    Ace Inhibitors Other (See Comments)     cough    Atenolol Other (See Comments)     Bradycardia (HR 40's)    Atorvastatin Other (See Comments)     Muscle aches    Zithromax [Azithromycin] Swelling    Codeine Nausea And Vomiting         Comments  No specialty comments available.    Background history:  Jodi Walls is a 80 y.o. female breast cancer status post lumpectomy, hypertension, osteoarthritis, hypothyroidism, CKD 3 who is being seen for follow up evaluation of  joint pain.  She is complaining of pain in her hands  and feet.  We started 2 years ago.  Symptoms have progressively worsened over the past 1 year.  She has pain on daily basis, 10 out of 10.  She is swelling in her knuckles and feet.  She has morning stiffness lasting for 1 hour.  She has difficulty opening doorknobs and jars.  She has developed weakness in her hands and is dropping things.  She takes Tylenol 1000 mg twice a day with minimal relief.  She also takes Aleve as needed.  She is unable to take oral NSAIDs due to CKD 3.  She has a history of left knee replacement.  She has osteoarthritis in the right knee as well.  Right knee cracks and pops.  Pain in the right knee gets worse going up and down the steps and standing from the sitting position.  She had a trauma to the left hand in October 2019.  Since then left third MCP joint stays swollen and painful.  She had x-ray of the left hand which did not show any fracture.  She denies psoriasis, inflammatory bowel disease,  inflammatory back pain, dactylitis, enthesitis, tenosynovitis or uveitis.  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  Her mother had arthritis  She had work-up with PCP which showed negative RF, ESR and normal TSH.    Interim history: She presents for follow-up of inflammatory arthritis, CPPD.  She did not tolerate sulfasalazine either [diarrhea].  She is off of Plaquenil [vision changes] and leflunomide [diarrhea].    She continues to take gabapentin 600 mg at bedtime and Cymbalta 60 mg daily.  She continues to complain of pain in the hands and feet associated with tingling and numbness.  She also has a chronic low back pain secondary to degenerative disc disease.  She had MRI of the lumbar spine that showed severe spinal stenosis and degenerative changes.  She is scheduled for epidural spinal injection.    She has peripheral neuropathy.    She denies any side effects with the medications.  Work-up came back negative for rheumatoid arthritis.  Hand  and foot x-rays showed moderate to severe degenerative changes with CPPD.  She denies any pseudogout flareups.  Work-up negative for secondary causes of pseudogout.     HPI  Review of Systems    REVIEW OF SYSTEMS:   Constitutional: No unanticipated weight loss or fevers.   Integumentary: No rash, photosensitivity, malar rash, livedo reticularis, alopecia and Raynaud's symptoms, sclerodactyly, skin tightening  Eyes: negative for visual disturbance and persistent redness, discharge from eyes   ENT: - No tinnitus, loss of hearing, vertigo, or recurrent ear infections.  - No history of nasal/oral ulcers.  - No history of dry eyes/dry mouth  Cardiovascular: No history of pericarditis, chest pain or murmur or palpitations  Respiratory: No shortness of breath, cough or history of interstitial lung disease.No history of pleurisy. No history of tuberculosis or atypical infections.  Gastrointestinal: No history of heart burn, dysphagia or esophageal dysmotility. No change in bowel habits or any inflammatory bowel disease.  Genitourinary: No history miscarriages.  Hematologic/Lymphatic: No abnormal bruising or bleeding, blood clots or swollen lymph nodes.  Neurological: No history of headaches, seizure or focal weakness. No history of neuropathies, paresthesias or hyperesthesias, facial droop, diplopia  Psychiatric: No history of bipolar disease, anxiety, depression  Endocrine: Denies any polyuria, polydipsia and osteoporosis  Allergic/Immunologic: No nasal congestion or hives.        I have reviewed patients Past medical History, Social History and Family History as mentioned in her chart and this remains unchanged fromprevious.    Past Medical History:   Diagnosis Date    Anemia     Arthritis     Cancer ([facility]) 1995    hx of breast cancer--no chemo or radiation needed    GERD (gastroesophageal reflux disease)     Hx of blood clots 2001    after gallbladder surgery--blood clot in arm where IV had been    Hypothyroidism      Kidney failure     stage 3    PONV (postoperative nausea and vomiting)     Wears glasses     reading     Past Surgical History:   Procedure Laterality Date    APPENDECTOMY      ARTHROPLASTY Left 12/25/2018    LEFT MIDDLE FINGER METACARPO-PHALANGEAL JOINT SYNOVECTOMY, LIGAMENT REBALANCING, EXTENSOR TENDON CENTRALIZATION AND SILICONE METACARPOPHALANGEAL JOINT ARTHROPLASTY performed by Delene Loll, MD at [facility]      r/t to herniated disc in lower back,  neck area--screws and rods in place    BREAST BIOPSY      left breast biopsy    BREAST SURGERY  1995    right mastectomy    CHOLECYSTECTOMY  2001    COLONOSCOPY  04/12/2015    Esophagogastroduodenoscopy with biopsy, Colonoscopy, diagnostic. no findings except for diverticula and check in 10 years    HYSTERECTOMY (CERVIX STATUS UNKNOWN)      TAH-BSO. no  cancer    JOINT REPLACEMENT Left     left total knee replacement    LIPOMA RESECTION      right side below right breast    MASTECTOMY, RADICAL  1995    UPPER GASTROINTESTINAL ENDOSCOPY  04/12/2015    and colonoscopy. hiatal hernia and gastritis     Social History     Socioeconomic History    Marital status: Widowed     Spouse name: Not on file    Number of children: Not on file    Years of education: Not on file    Highest education level: Not on file   Occupational History    Not on file   Tobacco Use    Smoking status: Never    Smokeless tobacco: Never   Vaping Use    Vaping Use: Never used   Substance and Sexual Activity    Alcohol use: Yes     Comment: social-rare. once a year    Drug use: No    Sexual activity: Not Currently   Other Topics Concern    Not on file   Social History Narrative    Not on file     Social Determinants of Health     Financial Resource Strain: Not on file   Food Insecurity: Not on file   Transportation Needs: Not on file   Physical Activity: Not on file   Stress: Not on file   Social Connections: Not on file   Intimate Partner Violence: Not on file   Housing Stability: Not  on file     Family History   Problem Relation Age of Onset    Cancer Mother         lung     Cancer Father         bladder    Heart Disease Father     High Blood Pressure Father     Cancer Maternal Aunt         ovarian    Cancer Paternal Cousin         ovarian       Vitals:    02/28/21 1255   BP: (!) 140/80   Site: Left Upper Arm   Position: Sitting   Cuff Size: Large Adult   Weight: 181 lb (82.1 kg)     Physical Exam  Constitutional:  Well developed, well nourished, no acute distress, non-toxic appearance   Musculoskeletal:    RIGHT  Swell  Tender  ROM  LEFT  Swell  Tender  ROM    DIP2  0  0   [place]  0  0   [place]berden   DIP3  0  0   [place]den  0  0   [place]   DIP4  0  0   [place]den  0  0   [place]den   DIP5  0  0   [place]den  0  0   [place]   PIP1  0  0   bony change  0  0   bony change  PIP2  ++ ++ FULL   0  0  FULL    PIP3  0  0  FULL   0  0  FULL    PIP4  + + FULL   + + FULL    PIP5  ++ ++ FULL   0 0 FULL    MCP1  0  0   bony change  0  0   bony change   MCP2  ++ ++  bony change++  + +  bony change   MCP3  ++ ++  bony change  + +  bony change+++   MCP4  0  0  FULL   0  0  FULL    MCP5  0  0  FULL   0  0  FULL    Wrist  0  0  FULL   0  0  FULL    Elbow  0  0  FULL   0  0  FULL    Shouldr  0  0  FULL   0  0  FULL    Hip  0  0  FULL   0  0  FULL    Knee  0  0  CREPITUS/VARUS   0  0  TKR   Ankle  0  0  FULL   0  0  FULL    MTP1  0  + FULL   0  + FULL    MTP2  0  + FULL   0  +  FULL    MTP3  0  + FULL   0  +  FULL    MTP4  0  + FULL   0  +  FULL    MTP5  0  + FULL   0  +  FULL    IP1  0  0  FULL   0  0  FULL    IP2  0  0  FULL   0  0  FULL    IP3  0  0  FULL   0  0  FULL    IP4  0  0  FULL   0  0  FULL    IP5  0  0  FULL   0 0 FULL     Squaring and bony enlargement of bilateral CMC joints  Ambulates without assistance, normal gait  Neck: Full ROM, no tenderness,supple   Back-  Tenderness++  Eyes:  PERRL, extra ocular movements intact, conjunctiva normal   HEENT:  Atraumatic, normocephalic, external ears normal,  oropharynx moist, no pharyngeal exudates.   Respiratory:  No respiratory distress  GI:  Soft, nondistended, normal bowel sounds, nontender, noorganomegaly, no mass, no rebound, no guarding   GU:  No costovertebral angle tenderness   Integument:  Well hydrated, no rash or telangiectasias  Lymphatic:  No lymphadenopathy noted   Neurologic:   Alert & oriented x 3, CN 2-12 normal, no focal deficits noted. Sensations Intact. Muscle strength 5/5 proximallyand distally in upper and lower extremities.   Psychiatric:  Speech and behavior appropriate           LABS AND IMAGING  Outside data reviewed and in HPI    Lab Results   Component Value Date/Time    WBC 6.5 02/13/2021 11:03 AM    RBC 3.85 02/13/2021 11:03 AM    RBC 4.72 10/26/2015 11:32 AM    HGB 11.1 02/13/2021 11:03  AM    HCT 34.5 02/13/2021 11:03 AM    PLT 194 02/13/2021 11:03 AM    MCV 89.7 02/13/2021 11:03 AM    MCH 28.7 02/13/2021 11:03 AM    MCHC 32.1 02/13/2021 11:03 AM    RDW 13.5 02/13/2021 11:03 AM    SEGSPCT 60.4 02/02/2010 12:26 PM    LYMPHOPCT 20.0 02/13/2021 11:03 AM    LYMPHOPCT 24.7 10/26/2015 11:32 AM    MONOPCT 6.8 02/13/2021 11:03 AM    EOSPCT 4.3 02/02/2010 12:26 PM    BASOPCT 1.1 02/13/2021 11:03 AM    MONOSABS 0.4 02/13/2021 11:03 AM    LYMPHSABS 1.3 02/13/2021 11:03 AM    EOSABS 0.3 02/13/2021 11:03 AM    BASOSABS 0.1 02/13/2021 11:03 AM    DIFFTYPE Auto 02/02/2010 12:26 PM       Chemistry        Component Value Date/Time    NA 141 10/13/2020 1129    K 3.5 10/13/2020 1129    CL 103 10/13/2020 1129    CO2 21 10/13/2020 1129    BUN 21 (H) 10/13/2020 1129    CREATININE 1.6 (H) 02/13/2021 1103        Component Value Date/Time    CALCIUM 9.5 10/13/2020 1129    ALKPHOS 108 06/24/2019 0933    AST 16 02/13/2021 1103    ALT 11 02/13/2021 1103    BILITOT 0.5 06/24/2019 0933          Lab Results   Component Value Date    SEDRATE 24 06/24/2019     Lab Results   Component Value Date    CRP <3.0 06/24/2019     Lab Results   Component Value Date/Time    ANA  Negative 11/12/2018 10:14 AM     Lab Results   Component Value Date/Time    RF <10.0 10/24/2018 02:21 PM     Lab Results   Component Value Date/Time    ANA Negative 11/12/2018 10:14 AM     No results found for: DSDNAG, DSDNAIGGIFA  No results found for: SSAROAB, SSALAAB  No results found for: SMAB, RNPAB  No results found for: CENTABIGG  No results found for: C3, C4, ACE  Lab Results   Component Value Date/Time    VITD25 36.9 11/12/2018 10:14 AM     No results found for: LABURIC, URICACID  Lab Results   Component Value Date    VITAMINB12 1860 (H) 06/19/2019     Lab Results   Component Value Date    TSH 1.31 01/03/2021     Lab Results   Component Value Date    VITD25 36.9 11/12/2018       Hand and foot x-rays 11/11/2018  FINDINGS:    Right hand:         Osseous alignment is normal.         Severe degenerative changes of the 1st CMC joint.  Mild degenerative changes    of the DIP joints and 1st IP joint.  Chondrocalcinosis of the TFCC.         No marginal erosions are identified.  No acute fracture or gross dislocation    is seen.         No focal soft tissue swelling is evident.         Left hand:         Osseous alignment is normal.         Mild degenerative changes of the DIP joints, 1st MCP joint and  triscaphe    joint.  Mild to moderate degenerative change of the 1st [facility] joint.    Chondrocalcinosis of the TFCC.         No marginal erosions are identified.  No acute fracture or gross dislocation    is seen.         No focal soft tissue swelling is evident.         Right foot:         Osseous alignment is normal.  Mild to moderate degenerative changes of the    TMT joints primarily at the 1st TMT joint.         Moderate plantar calcaneal spur.  Soft tissue swelling at the right forefoot.    Calcification along the plantar fascia.  No marginal erosions are identified.    No acute fracture or gross dislocation is seen.         The medial and middle cuneiforms demonstrate proper alignment with the base    of the 1st  and 2nd metatarsals respectively.         No tibiotalar joint effusion is seen.         Boehler's angle is maintained.         Left foot:         Osseous alignment is normal.         Moderate degenerative changes of the midfoot and tarsal metatarsal joints.    Remote healed fracture deformities of the 2nd 3rd metatarsal diaphyses.  Mild    plantar calcaneal spur.  Calcification of the plantar fascia.  Os trigonum    variant.  Soft tissue swelling of the left forefoot.  No marginal erosions    are identified.  No acute fracture or gross dislocation is seen.         The medial and middle cuneiforms demonstrate proper alignment with the base    of the 1st and 2nd metatarsals respectively.         No tibiotalar joint effusion is seen.         Boehler's angle is maintained.              Impression    Right hand:         1. Osteoarthrosis which is severe at the 1st [facility] joint.  CPPD.    2. No acute fracture or dislocation.    Left hand:         1. Mild to moderate osteoarthrosis.  CPPD.    2. No acute fracture or dislocation.    Right foot:         1. Mild-to-moderate degenerative changes as detailed above.  Moderate plantar    calcaneal spur.    2. Soft tissue swelling of the right forefoot.    3. No acute fracture or dislocation.    Left foot:         1. Moderate diffuse degenerative changes as detailed above.    2. Remote healed fracture deformities of the 2nd and 3rd metatarsal diaphyses.    3. Mild plantar calcaneal spur.    4. Soft tissue swelling of the forefoot.    5. No acute fracture or dislocation.        MRI left hand 11/15/2018  1. Mild volar subluxation of the 3rd metacarpophalangeal joint with mild    degenerative changes and a small effusion.    2. Severe 1st carpometacarpal degenerative changes.  Moderate 1st    interphalangeal degenerative changes  ASSESSMENT AND PLAN      Assessment/Plan:      ASSESSMENT:    1. Seronegative rheumatoid arthritis ([facility])    2. Peripheral polyneuropathy    3. High risk  medication use    4. Primary osteoarthritis involving multiple joints    5. H/O calcium pyrophosphate deposition disease (CPPD)        PLAN:   1.  Inflammatory arthritis-most likely seronegative rheumatoid arthritis with component of osteoarthritis contributing to the joint symptoms.  -RF, CCP negative, normal ESR and CRP.  Hand and foot x-rays with moderate to severe degenerative arthritis with CPPD  Unable to take oral NSAIDs due to CKD  Continues to be symptomatic with active synovitis on joint exam  Did not tolerate sulfasalazine due to severe diarrhea.Discontinued Plaquenil due to vision changes and discontinue leflunomide due to diarrhea  I will start Enbrel 50 mg subcu once a week.  Continue prednisone 5 mg daily.  Advised to take Tylenol 1000 mg 3 times a day, do not exceed 3 g daily  Continue diclofenac gel      2.  Peripheral neuropathy  Magnesium, B12, TSH, copper SPEP, hemoglobin A1c normal.  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   Advised to continue Cymbalta 60 mg daily and gabapentin 600 mg at bedtime    3.  High risk medication use  TNF INHIBITORS can cause increased risk of TB reactivation, oppurtunistic infections, lupus like illness, rash, hypersensitivity reaction, infusion reactions, demyelinating disease and possible risk of malignancies and lung diseases and were explained to the patient    Recommend flu, pneumonia, shingles and COVID-vaccine.    4. H/O calcium pyrophosphate deposition disease (CPPD)  Work-up negative for secondary causes     Diagnosis Orders   1. Seronegative rheumatoid arthritis (HCC)  Etanercept (ENBREL MINI) 50 MG/ML SOCT    Quantiferon, Incubated      2. Peripheral polyneuropathy        3. High risk medication use        4. Primary osteoarthritis involving multiple joints        5. H/O calcium pyrophosphate deposition disease (CPPD)            The patient indicates understanding of these issues and agrees with the plan.      Return in about  8 weeks (around 04/25/2021).      The risks and benefits of my recommendations, as well as other treatment options, benefits and side effects werediscussed with the patient. All questions were answered.       ######################################################################    I thank you for giving me theopportunity to participate in [name] care. If you have any questions or concerns please feel free to contact me. I look forward to following  Khandi along with you.      Electronically signed by: Oletta Lamas, MD, MD, 02/28/2021 1:20 PM    Documentation was done using voice recognition dragon software.  Every effort was made to ensure accuracy;however, inadvertent unintentional computerized transcription errors may be present.

## 2021-02-28 NOTE — Patient Instructions (Signed)
Start enbrel   Continue prednisone

## 2021-03-01 ENCOUNTER — Encounter

## 2021-03-01 MED ORDER — GABAPENTIN 300 MG PO CAPS
300 MG | ORAL_CAPSULE | ORAL | 1 refills | Status: DC
Start: 2021-03-01 — End: 2021-09-14

## 2021-03-01 NOTE — Telephone Encounter (Signed)
Pt will need a TB test done before I can upload CLINICALS to [facility].    Please send back when this has been done. Thanks.      If this requires a response please respond to the pool ( [email or web address]).      Thank you please advise patient.

## 2021-03-01 NOTE — Telephone Encounter (Signed)
Last visit 02/28/2021  Lab  02/13/2021  Next visit 04/11/2021  Last refill  07/04/2020

## 2021-03-04 LAB — QUANTIFERON, INCUBATED
QuantiFERON Mitogen: >10 IU/mL
QuantiFERON Nil: 0.02 IU/mL
Quantiferon TB Minus NIL: NEGATIVE
Quantiferon TB1 Minus NIL: 0 IU/mL (ref 0.00–0.34)
Quantiferon TB2 Minus NIL: 0 IU/mL (ref 0.00–0.34)

## 2021-03-16 ENCOUNTER — Encounter: Attending: Family Medicine | Primary: Family Medicine

## 2021-04-11 ENCOUNTER — Ambulatory Visit: Admit: 2021-04-11 | Discharge: 2021-04-11 | Payer: MEDICARE | Attending: Internal Medicine | Primary: Family Medicine

## 2021-04-11 DIAGNOSIS — M0600 Rheumatoid arthritis without rheumatoid factor, unspecified site: Secondary | ICD-10-CM

## 2021-04-11 NOTE — Patient Instructions (Addendum)
Start simponi aria infusion   Continue prednisone, cymbalta and gabapentin

## 2021-04-11 NOTE — Progress Notes (Signed)
04/11/2021     Patient Name: Jodi Walls  DOB: 12-06-1940  MEDICAL RECORD [id number]    MEDICATIONS  Current Outpatient Medications   Medication Sig Dispense Refill    gabapentin (NEURONTIN) 300 MG capsule TAKE 2 CAPSULES BY MOUTH DAILY WITH SUPPER 180 capsule 1    HYDROcodone-acetaminophen (NORCO) 5-325 MG per tablet TAKE 1 TABLET BY MOUTH TWICE DAILY FOR 15 DAYS      predniSONE (DELTASONE) 5 MG tablet TAKE 1 TABLET BY MOUTH DAILY 90 tablet 0    levothyroxine (SYNTHROID) 100 MCG tablet Take 1 tablet by mouth Daily 90 tablet 1    DULoxetine (CYMBALTA) 60 MG extended release capsule Take 1 capsule by mouth daily 90 capsule 1    diclofenac sodium (VOLTAREN) 1 % GEL APPLY 4 GRAMS TO LOWER EXTREMITY JOINTS AND 2 GRAMS TO UPPER EXTREMITY JOINTS FOUR TIMES DAILY AS NEEDED 500 g 1    furosemide (LASIX) 40 MG tablet Take 1 tablet by mouth daily 30 tablet 2    potassium chloride (KLOR-CON) 10 MEQ extended release tablet Take 10 mEq by mouth 2 times daily      melatonin 3 MG TABS tablet Take 3 mg by mouth nightly       aspirin 81 MG tablet Take 81 mg by mouth daily.       No current facility-administered medications for this visit.       ALLERGIES  Allergies   Allergen Reactions    Pcn [Penicillins] Swelling     Face and hands swelled    Triamterene      Severe stomach pain,SOB,severe cramps throughout her body    Ace Inhibitors Other (See Comments)     cough    Atenolol Other (See Comments)     Bradycardia (HR 40's)    Atorvastatin Other (See Comments)     Muscle aches    Zithromax [Azithromycin] Swelling    Codeine Nausea And Vomiting         Comments  No specialty comments available.    Background history:  Jodi Walls is a 80 y.o. female breast cancer status post lumpectomy, hypertension, osteoarthritis, hypothyroidism, CKD 3 who is being seen for follow up evaluation of  joint pain.  She is complaining of pain in her hands and feet.  We started 2 years ago.  Symptoms have progressively worsened over the past 1  year.  She has pain on daily basis, 10 out of 10.  She is swelling in her knuckles and feet.  She has morning stiffness lasting for 1 hour.  She has difficulty opening doorknobs and jars.  She has developed weakness in her hands and is dropping things.  She takes Tylenol 1000 mg twice a day with minimal relief.  She also takes Aleve as needed.  She is unable to take oral NSAIDs due to CKD 3.  She has a history of left knee replacement.  She has osteoarthritis in the right knee as well.  Right knee cracks and pops.  Pain in the right knee gets worse going up and down the steps and standing from the sitting position.  She had a trauma to the left hand in October 2019.  Since then left third MCP joint stays swollen and painful.  She had x-ray of the left hand which did not show any fracture.  She denies psoriasis, inflammatory bowel disease, inflammatory back pain, dactylitis, enthesitis, tenosynovitis or uveitis.  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  Her mother had arthritis  She had work-up with PCP which showed negative RF, ESR and normal TSH.    Interim history: She presents for follow-up of inflammatory arthritis, CPPD.  She did not tolerate sulfasalazine either [diarrhea].  She is off of Plaquenil [vision changes] and leflunomide [diarrhea].   Enbrel was approved but she cannot afford it.  She is on prednisone 5 mg daily.  She continues to take gabapentin 600 mg at bedtime and Cymbalta 60 mg daily.  She continues to complain of pain in the hands and feet associated with tingling and numbness.  She also has a chronic low back pain secondary to degenerative disc disease.  She had MRI of the lumbar spine that showed severe spinal stenosis and degenerative changes.  He received epidural spinal injection.  She has peripheral neuropathy.    She denies any side effects with the medications.  Work-up came back negative for rheumatoid arthritis.  Hand and foot x-rays  showed moderate to severe degenerative changes with CPPD.  She denies any pseudogout flareups.  Work-up negative for secondary causes of pseudogout.     HPI  Review of Systems    REVIEW OF SYSTEMS:   Constitutional: No unanticipated weight loss or fevers.   Integumentary: No rash, photosensitivity, malar rash, livedo reticularis, alopecia and Raynaud's symptoms, sclerodactyly, skin tightening  Eyes: negative for visual disturbance and persistent redness, discharge from eyes   ENT: - No tinnitus, loss of hearing, vertigo, or recurrent ear infections.  - No history of nasal/oral ulcers.  - No history of dry eyes/dry mouth  Cardiovascular: No history of pericarditis, chest pain or murmur or palpitations  Respiratory: No shortness of breath, cough or history of interstitial lung disease.No history of pleurisy. No history of tuberculosis or atypical infections.  Gastrointestinal: No history of heart burn, dysphagia or esophageal dysmotility. No change in bowel habits or any inflammatory bowel disease.  Genitourinary: No history miscarriages.  Hematologic/Lymphatic: No abnormal bruising or bleeding, blood clots or swollen lymph nodes.  Neurological: No history of headaches, seizure or focal weakness. No history of neuropathies, paresthesias or hyperesthesias, facial droop, diplopia  Psychiatric: No history of bipolar disease, anxiety, depression  Endocrine: Denies any polyuria, polydipsia and osteoporosis  Allergic/Immunologic: No nasal congestion or hives.        I have reviewed patients Past medical History, Social History and Family History as mentioned in her chart and this remains unchanged fromprevious.    Past Medical History:   Diagnosis Date    Anemia     Arthritis     Cancer ([place]) 1995    hx of breast cancer--no chemo or radiation needed    GERD (gastroesophageal reflux disease)     Hx of blood clots 2001    after gallbladder surgery--blood clot in arm where IV had been    Hypothyroidism     Kidney failure      stage 3    PONV (postoperative nausea and vomiting)     Seronegative rheumatoid arthritis ([place]) 04/11/2021    Wears glasses     reading     Past Surgical History:   Procedure Laterality Date    APPENDECTOMY      ARTHROPLASTY Left 12/25/2018    LEFT MIDDLE FINGER METACARPO-PHALANGEAL JOINT SYNOVECTOMY, LIGAMENT REBALANCING, EXTENSOR TENDON CENTRALIZATION AND SILICONE METACARPOPHALANGEAL JOINT ARTHROPLASTY performed by Delene Loll, MD at [facility]      r/t to herniated disc in lower back, neck  area--screws and rods in place    BREAST BIOPSY      left breast biopsy    BREAST SURGERY  1995    right mastectomy    CHOLECYSTECTOMY  2001    COLONOSCOPY  04/12/2015    Esophagogastroduodenoscopy with biopsy, Colonoscopy, diagnostic. no findings except for diverticula and check in 10 years    HYSTERECTOMY (CERVIX STATUS UNKNOWN)      TAH-BSO. no  cancer    JOINT REPLACEMENT Left     left total knee replacement    LIPOMA RESECTION      right side below right breast    MASTECTOMY, RADICAL  1995    UPPER GASTROINTESTINAL ENDOSCOPY  04/12/2015    and colonoscopy. hiatal hernia and gastritis     Social History     Socioeconomic History    Marital status: Widowed     Spouse name: Not on file    Number of children: Not on file    Years of education: Not on file    Highest education level: Not on file   Occupational History    Not on file   Tobacco Use    Smoking status: Never    Smokeless tobacco: Never   Vaping Use    Vaping Use: Never used   Substance and Sexual Activity    Alcohol use: Yes     Comment: social-rare. once a year    Drug use: No    Sexual activity: Not Currently   Other Topics Concern    Not on file   Social History Narrative    Not on file     Social Determinants of Health     Financial Resource Strain: Not on file   Food Insecurity: Not on file   Transportation Needs: Not on file   Physical Activity: Not on file   Stress: Not on file   Social Connections: Not on file   Intimate Partner Violence: Not  on file   Housing Stability: Not on file     Family History   Problem Relation Age of Onset    Cancer Mother         lung     Cancer Father         bladder    Heart Disease Father     High Blood Pressure Father     Cancer Maternal Aunt         ovarian    Cancer Paternal Cousin         ovarian       Vitals:    04/11/21 1442   BP: 130/80   Site: Left Upper Arm   Position: Sitting   Cuff Size: Large Adult   Weight: 181 lb (82.1 kg)     Physical Exam  Constitutional:  Well developed, well nourished, no acute distress, non-toxic appearance   Musculoskeletal:    RIGHT  Swell  Tender  ROM  LEFT  Swell  Tender  ROM    DIP2  0  0   [place]  0  0   [place]den   DIP3  0  0   [place]  0  0   [place]   DIP4  0  0   [place]  0  0   [place]   DIP5  0  0   [place]  0  0   [place]   PIP1  0  0   bony change  0  0   bony change   PIP2  ++ ++  FULL   0  0  FULL    PIP3  + + FULL   0  0  FULL    PIP4  + + FULL   + + FULL    PIP5  ++ ++ FULL   0 0 FULL    MCP1  0  0   bony change  0  0   bony change   MCP2  ++ ++  bony change++  ++ +  bony change   MCP3  ++ ++  bony change  ++ +  bony change+++   MCP4  0  0  FULL   0  0  FULL    MCP5  0  0  FULL   0  0  FULL    Wrist  0  0  FULL   0  0  FULL    Elbow  0  0  FULL   0  0  FULL    Shouldr  0  0  FULL   0  0  FULL    Hip  0  0  FULL   0  0  FULL    Knee  0  0  CREPITUS/VARUS   0  0  TKR   Ankle  0  0  FULL   0  0  FULL    MTP1  0  + FULL   0  + FULL    MTP2  0  + FULL   0  +  FULL    MTP3  0  + FULL   0  +  FULL    MTP4  0  + FULL   0  +  FULL    MTP5  0  + FULL   0  +  FULL    IP1  0  0  FULL   0  0  FULL    IP2  0  0  FULL   0  0  FULL    IP3  0  0  FULL   0  0  FULL    IP4  0  0  FULL   0  0  FULL    IP5  0  0  FULL   0 0 FULL     Squaring and bony enlargement of bilateral CMC joints  Ambulates without assistance, normal gait  Neck: Full ROM, no tenderness,supple   Back-  Tenderness++  Eyes:  PERRL, extra ocular movements intact, conjunctiva normal   HEENT:  Atraumatic,  normocephalic, external ears normal, oropharynx moist, no pharyngeal exudates.   Respiratory:  No respiratory distress  GI:  Soft, nondistended, normal bowel sounds, nontender, noorganomegaly, no mass, no rebound, no guarding   GU:  No costovertebral angle tenderness   Integument:  Well hydrated, no rash or telangiectasias  Lymphatic:  No lymphadenopathy noted   Neurologic:   Alert & oriented x 3, CN 2-12 normal, no focal deficits noted. Sensations Intact. Muscle strength 5/5 proximallyand distally in upper and lower extremities.   Psychiatric:  Speech and behavior appropriate           LABS AND IMAGING  Outside data reviewed and in HPI    Lab Results   Component Value Date/Time    WBC 6.5 02/13/2021 11:03 AM    RBC 3.85 02/13/2021 11:03 AM    RBC 4.72 10/26/2015 11:32 AM    HGB 11.1 02/13/2021 11:03 AM    HCT 34.5  02/13/2021 11:03 AM    PLT 194 02/13/2021 11:03 AM    MCV 89.7 02/13/2021 11:03 AM    MCH 28.7 02/13/2021 11:03 AM    MCHC 32.1 02/13/2021 11:03 AM    RDW 13.5 02/13/2021 11:03 AM    SEGSPCT 60.4 02/02/2010 12:26 PM    LYMPHOPCT 20.0 02/13/2021 11:03 AM    LYMPHOPCT 24.7 10/26/2015 11:32 AM    MONOPCT 6.8 02/13/2021 11:03 AM    EOSPCT 4.3 02/02/2010 12:26 PM    BASOPCT 1.1 02/13/2021 11:03 AM    MONOSABS 0.4 02/13/2021 11:03 AM    LYMPHSABS 1.3 02/13/2021 11:03 AM    EOSABS 0.3 02/13/2021 11:03 AM    BASOSABS 0.1 02/13/2021 11:03 AM    DIFFTYPE Auto 02/02/2010 12:26 PM       Chemistry        Component Value Date/Time    NA 141 10/13/2020 1129    K 3.5 10/13/2020 1129    CL 103 10/13/2020 1129    CO2 21 10/13/2020 1129    BUN 21 (H) 10/13/2020 1129    CREATININE 1.6 (H) 02/13/2021 1103        Component Value Date/Time    CALCIUM 9.5 10/13/2020 1129    ALKPHOS 108 06/24/2019 0933    AST 16 02/13/2021 1103    ALT 11 02/13/2021 1103    BILITOT 0.5 06/24/2019 0933          Lab Results   Component Value Date    SEDRATE 24 06/24/2019     Lab Results   Component Value Date    CRP <3.0 06/24/2019     Lab Results    Component Value Date/Time    ANA Negative 11/12/2018 10:14 AM     Lab Results   Component Value Date/Time    RF <10.0 10/24/2018 02:21 PM     Lab Results   Component Value Date/Time    ANA Negative 11/12/2018 10:14 AM     No results found for: DSDNAG, DSDNAIGGIFA  No results found for: SSAROAB, SSALAAB  No results found for: SMAB, RNPAB  No results found for: CENTABIGG  No results found for: C3, C4, ACE  Lab Results   Component Value Date/Time    VITD25 36.9 11/12/2018 10:14 AM     No results found for: LABURIC, URICACID  Lab Results   Component Value Date    VITAMINB12 1860 (H) 06/19/2019     Lab Results   Component Value Date    TSH 1.31 01/03/2021     Lab Results   Component Value Date    VITD25 36.9 11/12/2018       Hand and foot x-rays 11/11/2018  FINDINGS:    Right hand:         Osseous alignment is normal.         Severe degenerative changes of the 1st CMC joint.  Mild degenerative changes    of the DIP joints and 1st IP joint.  Chondrocalcinosis of the TFCC.         No marginal erosions are identified.  No acute fracture or gross dislocation    is seen.         No focal soft tissue swelling is evident.         Left hand:         Osseous alignment is normal.         Mild degenerative changes of the DIP joints, 1st MCP joint and triscaphe    joint.  Mild to moderate degenerative change of the 1st [facility] joint.    Chondrocalcinosis of the TFCC.         No marginal erosions are identified.  No acute fracture or gross dislocation    is seen.         No focal soft tissue swelling is evident.         Right foot:         Osseous alignment is normal.  Mild to moderate degenerative changes of the    TMT joints primarily at the 1st TMT joint.         Moderate plantar calcaneal spur.  Soft tissue swelling at the right forefoot.    Calcification along the plantar fascia.  No marginal erosions are identified.    No acute fracture or gross dislocation is seen.         The medial and middle cuneiforms demonstrate proper  alignment with the base    of the 1st and 2nd metatarsals respectively.         No tibiotalar joint effusion is seen.         Boehler's angle is maintained.         Left foot:         Osseous alignment is normal.         Moderate degenerative changes of the midfoot and tarsal metatarsal joints.    Remote healed fracture deformities of the 2nd 3rd metatarsal diaphyses.  Mild    plantar calcaneal spur.  Calcification of the plantar fascia.  Os trigonum    variant.  Soft tissue swelling of the left forefoot.  No marginal erosions    are identified.  No acute fracture or gross dislocation is seen.         The medial and middle cuneiforms demonstrate proper alignment with the base    of the 1st and 2nd metatarsals respectively.         No tibiotalar joint effusion is seen.         Boehler's angle is maintained.              Impression    Right hand:         1. Osteoarthrosis which is severe at the 1st [facility] joint.  CPPD.    2. No acute fracture or dislocation.    Left hand:         1. Mild to moderate osteoarthrosis.  CPPD.    2. No acute fracture or dislocation.    Right foot:         1. Mild-to-moderate degenerative changes as detailed above.  Moderate plantar    calcaneal spur.    2. Soft tissue swelling of the right forefoot.    3. No acute fracture or dislocation.    Left foot:         1. Moderate diffuse degenerative changes as detailed above.    2. Remote healed fracture deformities of the 2nd and 3rd metatarsal diaphyses.    3. Mild plantar calcaneal spur.    4. Soft tissue swelling of the forefoot.    5. No acute fracture or dislocation.        MRI left hand 11/15/2018  1. Mild volar subluxation of the 3rd metacarpophalangeal joint with mild    degenerative changes and a small effusion.    2. Severe 1st carpometacarpal degenerative changes.  Moderate 1st    interphalangeal degenerative changes       ASSESSMENT AND  PLAN      Assessment/Plan:      ASSESSMENT:    1. Seronegative rheumatoid arthritis ([place])    2.  Primary osteoarthritis involving multiple joints    3. Peripheral polyneuropathy    4. High risk medication use    5. H/O calcium pyrophosphate deposition disease (CPPD)        PLAN:   1.  Inflammatory arthritis-most likely seronegative rheumatoid arthritis with component of osteoarthritis contributing to the joint symptoms.  -RF, CCP negative, normal ESR and CRP.  Hand and foot x-rays with moderate to severe degenerative arthritis with CPPD  Unable to take oral NSAIDs due to CKD  Continues to be symptomatic with active synovitis on joint exam  Did not tolerate sulfasalazine due to severe diarrhea.Discontinued Plaquenil due to vision changes and discontinue leflunomide due to diarrhea  Cannot afford Enbrel due to high cost.  I will start Simponi Aria infusions.  Continue prednisone 5 mg daily  Advised to take Tylenol 1000 mg 3 times a day, do not exceed 3 g daily  Continue diclofenac gel      2.  Peripheral neuropathy  Magnesium, B12, TSH, copper SPEP, hemoglobin A1c normal.  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   Advised to continue Cymbalta 60 mg daily and gabapentin 600 mg at bedtime    3.  High risk medication use  TNF INHIBITORS can cause increased risk of TB reactivation, oppurtunistic infections, lupus like illness, rash, hypersensitivity reaction, infusion reactions, demyelinating disease and possible risk of malignancies and lung diseases and were explained to the patient    Recommend flu, pneumonia, shingles and COVID-vaccine.    4. H/O calcium pyrophosphate deposition disease (CPPD)  Work-up negative for secondary causes     Diagnosis Orders   1. Seronegative rheumatoid arthritis ([place])        2. Primary osteoarthritis involving multiple joints        3. Peripheral polyneuropathy        4. High risk medication use        5. H/O calcium pyrophosphate deposition disease (CPPD)            The patient indicates understanding of these issues and agrees with the plan.      Return  in about 8 weeks (around 06/06/2021).      The risks and benefits of my recommendations, as well as other treatment options, benefits and side effects werediscussed with the patient. All questions were answered.       ######################################################################    I thank you for giving me theopportunity to participate in [name] care. If you have any questions or concerns please feel free to contact me. I look forward to following  Mal[place] along with you.      Electronically signed by: Oletta Lamas, MD, MD, 04/11/2021 3:28 PM    Documentation was done using voice recognition dragon software.  Every effort was made to ensure accuracy;however, inadvertent unintentional computerized transcription errors may be present.

## 2021-04-24 MED ORDER — LEVOTHYROXINE SODIUM 100 MCG PO TABS
100 MCG | ORAL_TABLET | Freq: Every day | ORAL | 1 refills | Status: DC
Start: 2021-04-24 — End: 2021-10-16

## 2021-05-09 NOTE — Other (Unsigned)
Subjective  Subjective:  Patient ID: Jodi Walls  Age: 81 y.o. (DOB: 10/05/1940)  Ethnicity: Non-Hispanic  Race: White/Caucasian  Gender: female    Chief Complaint:  Chief Complaint  Patient presents with  ?????? Knee Pain    Rt knee pain x 49yrs  ?????? Total Knee Replacement    L TKA 06/03/13      Orthopedic History  Body Part: Knee  Side: Right  Symptoms Began: 2 years Ago  Pain Scale (1-10): 9  Pain Description/Symptoms: Constant, Aching, Limited ROM, Instability, Giving  way, Limping, Weakness, Night Pain, Sharp  Injury: No  Risk of falling: Yes  ADL Deficiencies: Standing, Walking, Going up/down stairs, Getting in/out of   the  car, Bending, Squatting, Kneeling, Putting on shoes/socks, Dressing self,  Getting up from seated position, Sleeping, Regular exercise program, Sitting,  Shopping, Cooking, Cleaning  Prior Treatment: Limitation of activities, NSAIDs, Pain relievers, Elevating,  Icing, HEP  Prior Treatments Failed: Yes  Other History Comments: alave prn but needs to limit NSAIDS. rx for Norco. HEP  from L TKA 2015    HPI:  She is had her left knee replaced.  She is having increasing pain in the  right knee.      Ortho Exam:  Swelling of the knee.  Range of motion 0-115 degrees.  Mild  crepitus.      Review of Systems:                Patient Vitals:  Vitals  Height: 5' (152.4 cm)  Weight: 180 lb (81.6 kg)  Pain Score:   9        X-Ray/Injection In Clinic Procedure Notes    X-Ray/Inj Notes    Author Status Last Editor Updated Created   Antony Contras., MD Sign when Signing Visit Antony Contras., MD 05/09/2021  12:11 PM 05/09/2021 12:11 PM     Assoc. Orders   DIAG-KNEE MIN 4-VIEWS RT; DIAG-KNEE MIN 4-VIEWS LT          Advanced arthritis of the right knee.  Complete loss of joint space.    Stable left knee replacement.          Author Status Last Editor Updated Created   Antony Contras., MD Sign when Signing Visit Antony Contras., MD 05/09/2021  12:12 PM 05/09/2021 12:11 PM     Assoc. Orders   PR ARTHROCENTESIS  ASPIR&/INJ MAJOR JT/BURSA W/O [name]          Cortisone Inj Right Knee:    After discussion of the risks and benefits, the patient has elected to proceed  with a cortisone injection  into the right knee. Written consent was signed and witnessed in the office  today. Confirmed no prior adverse reactions, no active infections, or relevant  allergies. There was no warmth, erythema or effusion. The skin was clear.  Initially the skin was sterilized with alcohol. Topical anesthesia was   achieved  with ethylene chloride. A needle was inserted into the right knee via a   superior  medial approach and I injected a solution mix of 2 ml Kenalog (Triamcinolone)   40  mg and lidocaine. The injection was completed without complication and a   bandage  was applied. There was no reaction noted in the office and the patient was  instructed to call with any signs of infection or allergic reaction.                Administrations This Visit  lidocaine 2 % injection 2 mL   Admin Date  05/09/2021 Action  Given Dose  2 mL Route  Injection Administered By  Antony Contras., MD       triamcinolone [facility]) injection 80 mg   Admin Date  05/09/2021 Action  Given Dose  80 mg Route  Intra-articular Administered By  Antony Contras., MD          In Clinic Administered Meds Billing Data   lidocaine 2 % injection 2 mL   Admin Date  05/09/2021 Site  Right Knee [facility]  63323-202-02 Lot#  [id number] Patient Supplied?  No       triamcinolone (KENALOG-40) injection 80 mg   Admin Date  05/09/2021 Site  Right Knee [facility]  [address]-9 Lot#  [id number] Patient Supplied?  No              Assessment/Impression:    Encounter Diagnoses  Code Name Primary?  ?????? M17.11 Primary osteoarthritis of right knee Yes  ?????? M25.561, G89.29 Chronic pain of right knee  ?????? Z47.1, Y09.983 Aftercare following left knee joint replacement surgery    She does have advanced arthritis of the right knee.  She would like to try a  cortisone shot today.    Plan:    Orders Placed This  Encounter  ?????? Xray Clinic - Knee 4 Views PA, AP, lateral, sunrise Left [[id number]]    Order Specific Question:   Release to patient    Answer:   Immediate  ?????? Xray Clinic - Knee 4 Views PA, AP, lateral, sunrise Right [[id number]]    Order Specific Question:   Release to patient    Answer:   Immediate  ?????? PR ARTHROCENTESIS ASPIR&/INJ MAJOR JT/BURSA W/O [name]    Standing Status:   Standing    Number of Occurrences:   1  ?????? triamcinolone (KENALOG-40) injection 80 mg  ?????? lidocaine 2 % injection 2 mL

## 2021-05-17 ENCOUNTER — Encounter

## 2021-05-17 MED ORDER — DULOXETINE HCL 60 MG PO CPEP
60 MG | ORAL_CAPSULE | Freq: Every day | ORAL | 1 refills | Status: AC
Start: 2021-05-17 — End: ?

## 2021-05-17 NOTE — Telephone Encounter (Signed)
Last visit 04/11/21  Labs 02/13/21, 02/13/21  Next visit 06/13/21  Last fill 07/04/20

## 2021-06-13 ENCOUNTER — Ambulatory Visit: Admit: 2021-06-13 | Discharge: 2021-06-13 | Payer: MEDICARE | Attending: Internal Medicine | Primary: Family Medicine

## 2021-06-13 DIAGNOSIS — M0600 Rheumatoid arthritis without rheumatoid factor, unspecified site: Secondary | ICD-10-CM

## 2021-06-13 NOTE — Telephone Encounter (Signed)
Called for follow up on the appeal. Spoke with Shirlee Limerick with [employer]. She looked into appeal and this was over turned.   Auth# [id number]   Approved 04/13/21 - 05/19/22    They are faxing the decision letter to the office.

## 2021-06-13 NOTE — Progress Notes (Signed)
06/13/2021     Patient Name: Jodi Walls  DOB: 1940-06-08  MEDICAL RECORD [id number]    MEDICATIONS  Current Outpatient Medications   Medication Sig Dispense Refill    DULoxetine (CYMBALTA) 60 MG extended release capsule TAKE 1 CAPSULE BY MOUTH DAILY 90 capsule 1    levothyroxine (SYNTHROID) 100 MCG tablet TAKE 1 TABLET BY MOUTH DAILY 90 tablet 1    gabapentin (NEURONTIN) 300 MG capsule TAKE 2 CAPSULES BY MOUTH DAILY WITH SUPPER 180 capsule 1    HYDROcodone-acetaminophen (NORCO) 5-325 MG per tablet TAKE 1 TABLET BY MOUTH TWICE DAILY FOR 15 DAYS      predniSONE (DELTASONE) 5 MG tablet TAKE 1 TABLET BY MOUTH DAILY 90 tablet 0    diclofenac sodium (VOLTAREN) 1 % GEL APPLY 4 GRAMS TO LOWER EXTREMITY JOINTS AND 2 GRAMS TO UPPER EXTREMITY JOINTS FOUR TIMES DAILY AS NEEDED 500 g 1    furosemide (LASIX) 40 MG tablet Take 1 tablet by mouth daily 30 tablet 2    potassium chloride (KLOR-CON) 10 MEQ extended release tablet Take 10 mEq by mouth 2 times daily      melatonin 3 MG TABS tablet Take 3 mg by mouth nightly       aspirin 81 MG tablet Take 81 mg by mouth daily.       No current facility-administered medications for this visit.       ALLERGIES  Allergies   Allergen Reactions    Pcn [Penicillins] Swelling     Face and hands swelled    Triamterene      Severe stomach pain,SOB,severe cramps throughout her body    Ace Inhibitors Other (See Comments)     cough    Atenolol Other (See Comments)     Bradycardia (HR 40's)    Atorvastatin Other (See Comments)     Muscle aches    Zithromax [Azithromycin] Swelling    Codeine Nausea And Vomiting         Comments  No specialty comments available.    Background history:  Jodi Walls is a 81 y.o. female breast cancer status post lumpectomy, hypertension, osteoarthritis, hypothyroidism, CKD 3 who is being seen for follow up evaluation of  joint pain.  She is complaining of pain in her hands and feet.  We started 2 years ago.  Symptoms have progressively worsened over the past 1  year.  She has pain on daily basis, 10 out of 10.  She is swelling in her knuckles and feet.  She has morning stiffness lasting for 1 hour.  She has difficulty opening doorknobs and jars.  She has developed weakness in her hands and is dropping things.  She takes Tylenol 1000 mg twice a day with minimal relief.  She also takes Aleve as needed.  She is unable to take oral NSAIDs due to CKD 3.  She has a history of left knee replacement.  She has osteoarthritis in the right knee as well.  Right knee cracks and pops.  Pain in the right knee gets worse going up and down the steps and standing from the sitting position.  She had a trauma to the left hand in October 2019.  Since then left third MCP joint stays swollen and painful.  She had x-ray of the left hand which did not show any fracture.  She denies psoriasis, inflammatory bowel disease, inflammatory back pain, dactylitis, enthesitis, tenosynovitis or uveitis.  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  Her mother had arthritis  She had work-up with PCP which showed negative RF, ESR and normal TSH.    Interim history: She presents for follow-up of inflammatory arthritis, CPPD.  She did not tolerate sulfasalazine either [diarrhea].  She is off of Plaquenil [vision changes] and leflunomide [diarrhea].   Enbrel was approved but she cannot afford it.  Simponi Aria infusion was denied, appeal was submitted awaiting response.  She is on prednisone 5 mg daily.  She continues to take gabapentin 600 mg at bedtime and Cymbalta 60 mg daily.  She continues to complain of pain in the hands and feet associated with tingling and numbness.  She also has a chronic low back pain secondary to degenerative disc disease.  She had MRI of the lumbar spine that showed severe spinal stenosis and degenerative changes.  He received epidural spinal injection.  She follows a pain specialist.  She also has peripheral neuropathy.    She denies any side  effects with the medications.  Work-up came back negative for rheumatoid arthritis.  Hand and foot x-rays showed moderate to severe degenerative changes with CPPD.  She denies any pseudogout flareups.  Work-up negative for secondary causes of pseudogout.     HPI  Review of Systems    REVIEW OF SYSTEMS:   Constitutional: No unanticipated weight loss or fevers.   Integumentary: No rash, photosensitivity, malar rash, livedo reticularis, alopecia and Raynaud's symptoms, sclerodactyly, skin tightening  Eyes: negative for visual disturbance and persistent redness, discharge from eyes   ENT: - No tinnitus, loss of hearing, vertigo, or recurrent ear infections.  - No history of nasal/oral ulcers.  - No history of dry eyes/dry mouth  Cardiovascular: No history of pericarditis, chest pain or murmur or palpitations  Respiratory: No shortness of breath, cough or history of interstitial lung disease.No history of pleurisy. No history of tuberculosis or atypical infections.  Gastrointestinal: No history of heart burn, dysphagia or esophageal dysmotility. No change in bowel habits or any inflammatory bowel disease.  Genitourinary: No history miscarriages.  Hematologic/Lymphatic: No abnormal bruising or bleeding, blood clots or swollen lymph nodes.  Neurological: No history of headaches, seizure or focal weakness. No history of neuropathies, paresthesias or hyperesthesias, facial droop, diplopia  Psychiatric: No history of bipolar disease, anxiety, depression  Endocrine: Denies any polyuria, polydipsia and osteoporosis  Allergic/Immunologic: No nasal congestion or hives.        I have reviewed patients Past medical History, Social History and Family History as mentioned in her chart and this remains unchanged fromprevious.    Past Medical History:   Diagnosis Date    Anemia     Arthritis     Cancer ([place]) 1995    hx of breast cancer--no chemo or radiation needed    GERD (gastroesophageal reflux disease)     Hx of blood clots 2001     after gallbladder surgery--blood clot in arm where IV had been    Hypothyroidism     Kidney failure     stage 3    PONV (postoperative nausea and vomiting)     Seronegative rheumatoid arthritis ([place]) 04/11/2021    Wears glasses     reading     Past Surgical History:   Procedure Laterality Date    APPENDECTOMY      ARTHROPLASTY Left 12/25/2018    LEFT MIDDLE FINGER METACARPO-PHALANGEAL JOINT SYNOVECTOMY, LIGAMENT REBALANCING, EXTENSOR TENDON CENTRALIZATION AND SILICONE METACARPOPHALANGEAL JOINT ARTHROPLASTY performed by Delene Loll, MD at [facility]  BACK SURGERY      r/t to herniated disc in lower back, neck area--screws and rods in place    BREAST BIOPSY      left breast biopsy    [place]    right mastectomy    CHOLECYSTECTOMY  2001    COLONOSCOPY  04/12/2015    Esophagogastroduodenoscopy with biopsy, Colonoscopy, diagnostic. no findings except for diverticula and check in 10 years    HYSTERECTOMY (CERVIX STATUS UNKNOWN)      TAH-BSO. no  cancer    JOINT REPLACEMENT Left     left total knee replacement    LIPOMA RESECTION      right side below right breast    MASTECTOMY, RADICAL  1995    UPPER GASTROINTESTINAL ENDOSCOPY  04/12/2015    and colonoscopy. hiatal hernia and gastritis     Social History     Socioeconomic History    Marital status: Widowed     Spouse name: Not on file    Number of children: Not on file    Years of education: Not on file    Highest education level: Not on file   Occupational History    Not on file   Tobacco Use    Smoking status: Never    Smokeless tobacco: Never   Vaping Use    Vaping Use: Never used   Substance and Sexual Activity    Alcohol use: Yes     Comment: social-rare. once a year    Drug use: No    Sexual activity: Not Currently   Other Topics Concern    Not on file   Social History Narrative    Not on file     Social Determinants of Health     Financial Resource Strain: Not on file   Food Insecurity: Not on file   Transportation Needs: Not on file   Physical  Activity: Not on file   Stress: Not on file   Social Connections: Not on file   Intimate Partner Violence: Not on file   Housing Stability: Not on file     Family History   Problem Relation Age of Onset    Cancer Mother         lung     Cancer Father         bladder    Heart Disease Father     High Blood Pressure Father     Cancer Maternal Aunt         ovarian    Cancer Paternal Cousin         ovarian       Vitals:    06/13/21 1320   BP: 112/82   Site: Left Upper Arm   Position: Sitting   Cuff Size: Large Adult   Weight: 171 lb (77.6 kg)     Physical Exam  Constitutional:  Well developed, well nourished, no acute distress, non-toxic appearance   Musculoskeletal:    RIGHT  Swell  Tender  ROM  LEFT  Swell  Tender  ROM    DIP2  0  0   [place]  0  0   [place]   DIP3  0  0   [place]  0  0   [place]   DIP4  0  0   [place]  0  0   [place]   DIP5  0  0   [place]  0  0   [place]   PIP1  0  0  bony change  0  0   bony change   PIP2  ++ ++ FULL   0  0  FULL    PIP3  + + FULL   0  0  FULL    PIP4  + + FULL   + + FULL    PIP5  + + FULL   0 0 FULL    MCP1  0  0   bony change  0  0   bony change   MCP2  ++ ++  bony change++  ++ +  bony change   MCP3  ++ ++  bony change  ++ +  bony change+++   MCP4  0  0  FULL   0  0  FULL    MCP5  0  0  FULL   0  0  FULL    Wrist  0  0  FULL   0  0  FULL    Elbow  0  0  FULL   0  0  FULL    Shouldr  0  0  FULL   0  0  FULL    Hip  0  0  FULL   0  0  FULL    Knee  0  0  CREPITUS/VARUS   0  0  TKR   Ankle  0  0  FULL   0  0  FULL    MTP1  0  + FULL   0  + FULL    MTP2  0  + FULL   0  +  FULL    MTP3  0  + FULL   0  +  FULL    MTP4  0  + FULL   0  +  FULL    MTP5  0  + FULL   0  +  FULL    IP1  0  0  FULL   0  0  FULL    IP2  0  0  FULL   0  0  FULL    IP3  0  0  FULL   0  0  FULL    IP4  0  0  FULL   0  0  FULL    IP5  0  0  FULL   0 0 FULL     Squaring and bony enlargement of bilateral CMC joints  Ambulates without assistance, normal gait  Neck: Full ROM, no tenderness,supple   Back-   Tenderness++  Eyes:  PERRL, extra ocular movements intact, conjunctiva normal   HEENT:  Atraumatic, normocephalic, external ears normal, oropharynx moist, no pharyngeal exudates.   Respiratory:  No respiratory distress  GI:  Soft, nondistended, normal bowel sounds, nontender, noorganomegaly, no mass, no rebound, no guarding   GU:  No costovertebral angle tenderness   Integument:  Well hydrated, no rash or telangiectasias  Lymphatic:  No lymphadenopathy noted   Neurologic:   Alert & oriented x 3, CN 2-12 normal, no focal deficits noted. Sensations Intact. Muscle strength 5/5 proximallyand distally in upper and lower extremities.   Psychiatric:  Speech and behavior appropriate           LABS AND IMAGING  Outside data reviewed and in HPI    Lab Results   Component Value Date/Time    WBC 6.5 02/13/2021 11:03 AM    RBC 3.85 02/13/2021 11:03 AM    RBC 4.72  10/26/2015 11:32 AM    HGB 11.1 02/13/2021 11:03 AM    HCT 34.5 02/13/2021 11:03 AM    PLT 194 02/13/2021 11:03 AM    MCV 89.7 02/13/2021 11:03 AM    MCH 28.7 02/13/2021 11:03 AM    MCHC 32.1 02/13/2021 11:03 AM    RDW 13.5 02/13/2021 11:03 AM    SEGSPCT 60.4 02/02/2010 12:26 PM    LYMPHOPCT 20.0 02/13/2021 11:03 AM    LYMPHOPCT 24.7 10/26/2015 11:32 AM    MONOPCT 6.8 02/13/2021 11:03 AM    EOSPCT 4.3 02/02/2010 12:26 PM    BASOPCT 1.1 02/13/2021 11:03 AM    MONOSABS 0.4 02/13/2021 11:03 AM    LYMPHSABS 1.3 02/13/2021 11:03 AM    EOSABS 0.3 02/13/2021 11:03 AM    BASOSABS 0.1 02/13/2021 11:03 AM    DIFFTYPE Auto 02/02/2010 12:26 PM       Chemistry        Component Value Date/Time    NA 141 10/13/2020 1129    K 3.5 10/13/2020 1129    CL 103 10/13/2020 1129    CO2 21 10/13/2020 1129    BUN 21 (H) 10/13/2020 1129    CREATININE 1.6 (H) 02/13/2021 1103        Component Value Date/Time    CALCIUM 9.5 10/13/2020 1129    ALKPHOS 108 06/24/2019 0933    AST 16 02/13/2021 1103    ALT 11 02/13/2021 1103    BILITOT 0.5 06/24/2019 0933          Lab Results   Component Value Date     SEDRATE 24 06/24/2019     Lab Results   Component Value Date    CRP <3.0 06/24/2019     Lab Results   Component Value Date/Time    ANA Negative 11/12/2018 10:14 AM     Lab Results   Component Value Date/Time    RF <10.0 10/24/2018 02:21 PM     Lab Results   Component Value Date/Time    ANA Negative 11/12/2018 10:14 AM     No results found for: DSDNAG, DSDNAIGGIFA  No results found for: SSAROAB, SSALAAB  No results found for: SMAB, RNPAB  No results found for: CENTABIGG  No results found for: C3, C4, ACE  Lab Results   Component Value Date/Time    VITD25 36.9 11/12/2018 10:14 AM     No results found for: LABURIC, URICACID  Lab Results   Component Value Date    VITAMINB12 1860 (H) 06/19/2019     Lab Results   Component Value Date    TSH 1.31 01/03/2021     Lab Results   Component Value Date    VITD25 36.9 11/12/2018       Hand and foot x-rays 11/11/2018  FINDINGS:    Right hand:         Osseous alignment is normal.         Severe degenerative changes of the 1st CMC joint.  Mild degenerative changes    of the DIP joints and 1st IP joint.  Chondrocalcinosis of the TFCC.         No marginal erosions are identified.  No acute fracture or gross dislocation    is seen.         No focal soft tissue swelling is evident.         Left hand:         Osseous alignment is normal.         Mild  degenerative changes of the DIP joints, 1st MCP joint and triscaphe    joint.  Mild to moderate degenerative change of the 1st [facility] joint.    Chondrocalcinosis of the TFCC.         No marginal erosions are identified.  No acute fracture or gross dislocation    is seen.         No focal soft tissue swelling is evident.         Right foot:         Osseous alignment is normal.  Mild to moderate degenerative changes of the    TMT joints primarily at the 1st TMT joint.         Moderate plantar calcaneal spur.  Soft tissue swelling at the right forefoot.    Calcification along the plantar fascia.  No marginal erosions are identified.    No acute  fracture or gross dislocation is seen.         The medial and middle cuneiforms demonstrate proper alignment with the base    of the 1st and 2nd metatarsals respectively.         No tibiotalar joint effusion is seen.         Boehler's angle is maintained.         Left foot:         Osseous alignment is normal.         Moderate degenerative changes of the midfoot and tarsal metatarsal joints.    Remote healed fracture deformities of the 2nd 3rd metatarsal diaphyses.  Mild    plantar calcaneal spur.  Calcification of the plantar fascia.  Os trigonum    variant.  Soft tissue swelling of the left forefoot.  No marginal erosions    are identified.  No acute fracture or gross dislocation is seen.         The medial and middle cuneiforms demonstrate proper alignment with the base    of the 1st and 2nd metatarsals respectively.         No tibiotalar joint effusion is seen.         Boehler's angle is maintained.              Impression    Right hand:         1. Osteoarthrosis which is severe at the 1st [facility] joint.  CPPD.    2. No acute fracture or dislocation.    Left hand:         1. Mild to moderate osteoarthrosis.  CPPD.    2. No acute fracture or dislocation.    Right foot:         1. Mild-to-moderate degenerative changes as detailed above.  Moderate plantar    calcaneal spur.    2. Soft tissue swelling of the right forefoot.    3. No acute fracture or dislocation.    Left foot:         1. Moderate diffuse degenerative changes as detailed above.    2. Remote healed fracture deformities of the 2nd and 3rd metatarsal diaphyses.    3. Mild plantar calcaneal spur.    4. Soft tissue swelling of the forefoot.    5. No acute fracture or dislocation.        MRI left hand 11/15/2018  1. Mild volar subluxation of the 3rd metacarpophalangeal joint with mild    degenerative changes and a small effusion.    2. Severe 1st carpometacarpal degenerative changes.  Moderate 1st    interphalangeal degenerative changes       ASSESSMENT AND  PLAN      Assessment/Plan:      ASSESSMENT:    1. Seronegative rheumatoid arthritis ([place])    2. Primary osteoarthritis involving multiple joints    3. High risk medication use    4. H/O calcium pyrophosphate deposition disease (CPPD)    5. Peripheral polyneuropathy        PLAN:   1.  Inflammatory arthritis-most likely seronegative rheumatoid arthritis with component of osteoarthritis contributing to the joint symptoms.  -RF, CCP negative, normal ESR and CRP.  Hand and foot x-rays with moderate to severe degenerative arthritis with CPPD  Unable to take oral NSAIDs due to CKD  Continues to be symptomatic with active synovitis on joint exam  Did not tolerate sulfasalazine due to severe diarrhea.Discontinued Plaquenil due to vision changes and discontinue leflunomide due to diarrhea  Cannot afford Enbrel due to high cost.  Simponi Aria infusion was denied by insurance, appeal was submitted awaiting response.    I will continue prednisone 5 mg daily in the meantime and take extra 5 mg/day for flareups  Advised to take Tylenol 1000 mg 3 times a day, do not exceed 3 g daily  Continue diclofenac gel      2.  Peripheral neuropathy  Magnesium, B12, TSH, copper SPEP, hemoglobin A1c normal.  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   Advised to continue Cymbalta 60 mg daily and gabapentin 600 mg at bedtime    3.  High risk medication use  TNF INHIBITORS can cause increased risk of TB reactivation, oppurtunistic infections, lupus like illness, rash, hypersensitivity reaction, infusion reactions, demyelinating disease and possible risk of malignancies and lung diseases and were explained to the patient    Recommend flu, pneumonia, shingles and COVID-vaccine.    4. H/O calcium pyrophosphate deposition disease (CPPD)  Work-up negative for secondary causes     Diagnosis Orders   1. Seronegative rheumatoid arthritis ([place])        2. Primary osteoarthritis involving multiple joints        3. High risk  medication use        4. H/O calcium pyrophosphate deposition disease (CPPD)        5. Peripheral polyneuropathy            The patient indicates understanding of these issues and agrees with the plan.      Return in about 3 months (around 09/10/2021).      The risks and benefits of my recommendations, as well as other treatment options, benefits and side effects werediscussed with the patient. All questions were answered.       ######################################################################    I thank you for giving me theopportunity to participate in [name] care. If you have any questions or concerns please feel free to contact me. I look forward to following  [place] along with you.      Electronically signed by: Oletta Lamas, MD, MD, 06/13/2021 2:02 PM    Documentation was done using voice recognition dragon software.  Every effort was made to ensure accuracy;however, inadvertent unintentional computerized transcription errors may be present.

## 2021-06-13 NOTE — Patient Instructions (Signed)
Continue same meds

## 2021-06-14 ENCOUNTER — Inpatient Hospital Stay: Admit: 2021-06-14 | Payer: MEDICARE | Primary: Family Medicine

## 2021-06-14 DIAGNOSIS — Z1231 Encounter for screening mammogram for malignant neoplasm of breast: Secondary | ICD-10-CM

## 2021-06-14 NOTE — Telephone Encounter (Signed)
Patient is aware and so is daughter Earnest Bailey. Information for [facility] infusion center left on voicemail. dd

## 2021-06-21 MED ORDER — PREDNISONE 5 MG PO TABS
5 MG | ORAL_TABLET | ORAL | 1 refills | Status: AC
Start: 2021-06-21 — End: ?

## 2021-06-21 NOTE — Telephone Encounter (Signed)
Last visit  06/13/21  Lab 02/13/21  Next visit  none-due back in May/23  Last refill 01/30/21  #90

## 2021-07-13 ENCOUNTER — Inpatient Hospital Stay: Admit: 2021-07-13 | Discharge: 2021-07-14 | Payer: MEDICARE | Primary: Family Medicine

## 2021-07-13 DIAGNOSIS — M0600 Rheumatoid arthritis without rheumatoid factor, unspecified site: Secondary | ICD-10-CM

## 2021-07-13 MED ORDER — GOLIMUMAB 50 MG/4ML IV SOLN
504 MG/4ML | Freq: Once | INTRAVENOUS | Status: DC
Start: 2021-07-13 — End: 2021-07-13

## 2021-07-13 MED ORDER — SODIUM CHLORIDE 0.9 % IV SOLN
0.9 % | Freq: Once | INTRAVENOUS | Status: DC
Start: 2021-07-13 — End: 2021-07-14

## 2021-07-13 MED ORDER — GOLIMUMAB 50 MG/4ML IV SOLN
50 MG/4ML | Freq: Once | INTRAVENOUS | Status: AC
Start: 2021-07-13 — End: 2021-07-13
  Administered 2021-07-13: 19:00:00 150 mg via INTRAVENOUS

## 2021-07-13 MED ORDER — NORMAL SALINE FLUSH 0.9 % IV SOLN
0.9 % | INTRAVENOUS | Status: DC | PRN
Start: 2021-07-13 — End: 2021-07-14
  Administered 2021-07-13 (×4): 10 mL via INTRAVENOUS

## 2021-07-13 MED FILL — SIMPONI ARIA 50 MG/4ML IV SOLN: 50 MG/4ML | INTRAVENOUS | Qty: 12

## 2021-07-13 MED FILL — SIMPONI ARIA 50 MG/4ML IV SOLN: 50 MG/4ML | INTRAVENOUS | Qty: 12.6

## 2021-07-13 NOTE — Progress Notes (Signed)
Simponi Aria Infusion Visit    NAME:  Jodi Walls  DATE OF BIRTH:  13-Jul-1940  MEDICAL RECORD NUMBER:  [id number]  DATE:  07/13/2021    Patient arrived to [facility]   '[]'$   per wheelchair   '[x]'$  ambulatory with cane    Alert and oriented x 4. Patient reports that she has tried multiple treatments for her arthritis and has had adverse effects to them so she is a little nervous about receiving Simponi Aria. Medication information - dose, schedule, actions, and side effects reviewed with patient and patient's daughter, Earnest Bailey. Patient asked questions and was satisfied with answers given.    Patient diagnosis: Seronegative Rheumatoid Arthritis    Patient Active Problem List   Diagnosis    Other hyperlipidemia    Hypothyroidism    Essential hypertension, benign    HX: breast cancer    Renal insufficiency    Iron deficiency anemia due to chronic blood loss    MGUS (monoclonal gammopathy of unknown significance)    Chronic renal disease, stage III (HCC) [347425]    Seronegative rheumatoid arthritis (HCC)     Allergies   Allergen Reactions    Pcn [Penicillins] Swelling     Face and hands swelled    Triamterene      Severe stomach pain,SOB,severe cramps throughout her body    Ace Inhibitors Other (See Comments)     cough    Atenolol Other (See Comments)     Bradycardia (HR 40's)    Atorvastatin Other (See Comments)     Muscle aches    Zithromax [Azithromycin] Swelling    Codeine Nausea And Vomiting       What  symptoms of Seronegative RA is pt experiencing?   '[x]'$  Rheumatoid nodules - pointer finger on both hands   '[x]'$  Joint Swelling - bilateral hands, fingers, toes, and ankles   '[x]'$  Swelling - ankles   '[]'$  Joint redness or warmth   '[x]'$  Pain - all joints especially hands, feet and back   '[x]'$  Fatigue   '[x]'$  Muscle aches   '[x]'$  Other - weakness    What other treatments has pt been using to treat RA?: Per Dr Deatra Canter progress report - patient could not tolerate Sulfasalazide, Plaquenil and Leflunomide due to various side  effects. Patient was approved for Enbrel but could not afford it. Patient is currently taking Prednisone 5 mg every day.    Is pt on Methotrexate: No - has taken this in the past but could not tolerate it due to severe diarrhea    Is this first dose of Simponi Aria? Yes - Medication information - dose, schedule, actions, and side effects reviewed with patient and patient's daughter, Earnest Bailey.     Patient Education done on Simponi Aria:   '[x]'$  Written - via AVS   '[x]'$  Verbal    Has the patient recently experienced:  Fever: No  Chills: No    Night Sweats: Yes - MD is aware and feels it is due to daily steroids.  Illness: No  Infection: No   Open wounds: No   Inflammation: No   Weight loss: No    Are you on antibiotics presently?: No    Any recent oral or dental procedures done recently?: No    Any recent flu shots or vaccines? No    History of hepatitis B? No    Date of last Quantiferon Gold TB  test? February 28, 2021   Results of TB test were:  '[x]'$   Negative  '[]'$  Positive     BP (!) 130/55    Pulse 62    Temp 97.9 ??F (36.6 ??C) (Infrared)    Resp 16    Ht 5' (1.524 m)    Wt 174 lb 2.6 oz (79 kg)    SpO2 97%    BMI 34.01 kg/m??      Premedicated: None ordered  '[]'$  Benadryl 25 mg IV  '[]'$  Benadryl 50 mg IV  '[]'$  Benadryl 25 mg oral   '[]'$  Benadryl 50 mg oral  '[]'$  Tylenol 325 mg oral  '[]'$  Tylenol 650 mg oral  '[]'$  Zyrtec 10 mg oral  '[]'$  Other    Dose ordered: '2mg'$ /kg. Call placed to Dr Deatra Canter to clarify Simponi Aria dose - Dose today should be 158 based on a weight of 79 kg and order of 2 mg/kg. Only 150 mg available in pharmacy. Dr Deatra Canter gave order to proceed with a dose of 150 mg today and to order 158 mg for next treatment.    Total dose is 150 mg in 100 ml of NS and infused with a 0.2 micron low protein binding filter.    Simponi Aria infused over 34  minutes    Monitoring of vital signs and infusion done per protocol and documented in flowsheets.    Education: Verbal and written discharge instructions reviewed with patient and daughter,  Earnest Bailey. Both verbalized understanding. Written copy given via AVS.     Response to treatment:  Well tolerated by patient. No signs/symptoms of adverse reaction noted.    Scheduled to return for next infusion on August 10, 2021     Electronically signed by Jeral Pinch, RN on 07/13/2021 at 2:16 PM

## 2021-07-13 NOTE — Discharge Instructions (Signed)
Outpatient Infusion Discharge Instructions  [facility]  [address], [place] [address]  Telephone: [phone]      FAX [phone]    NAME:  EVELYN AGUINALDO  DATE OF BIRTH:  1941/02/27  MEDICAL RECORD NUMBER:  [id number]  DATE:  July 13, 2021    Reason for Outpatient Infusion Visit: Carita Pian - 1st treatment    If you develop any these symptoms please contact you Doctor    '[x]'$  Nausea and/or vomiting not relieved with medication   '[x]'$  Swelling, redness, and/or bleeding at injection or IV site    '[x]'$  Fever or chills  '[x]'$  Rash or itching   '[x]'$  Shortness of breath  '[x]'$  Please review After Visit Summary (AVS) information on: Simponi and Rheumatoid arthritis    '[x]'$  Other: Next appt - August 10, 2021      Outpatient [place] Information: Should you experience any significant changes in your health or have questions about your care please contact the [place] at [place] 8:00 am - 4:00 pm.  If you need help outside these hours and cannot wait until we are again available, contact your Primary Care Physician or go to the hospital emergency room.       Electronically signed by Jeral Pinch, RN on 07/13/2021 at 3:27 PM

## 2021-07-14 ENCOUNTER — Encounter: Payer: MEDICARE | Primary: Family Medicine

## 2021-08-10 ENCOUNTER — Inpatient Hospital Stay: Admit: 2021-08-10 | Discharge: 2021-08-10 | Payer: MEDICARE | Primary: Family Medicine

## 2021-08-10 DIAGNOSIS — M0600 Rheumatoid arthritis without rheumatoid factor, unspecified site: Secondary | ICD-10-CM

## 2021-08-10 MED ORDER — SODIUM CHLORIDE 0.9 % IV SOLN
0.9 % | Freq: Once | INTRAVENOUS | Status: AC
Start: 2021-08-10 — End: 2021-08-10
  Administered 2021-08-10: 16:00:00 153.75 mg/kg via INTRAVENOUS

## 2021-08-10 MED ORDER — NORMAL SALINE FLUSH 0.9 % IV SOLN
0.9 % | INTRAVENOUS | Status: DC | PRN
Start: 2021-08-10 — End: 2021-08-11

## 2021-08-10 MED FILL — SIMPONI ARIA 50 MG/4ML IV SOLN: 50 MG/4ML | INTRAVENOUS | Qty: 12.3

## 2021-08-10 NOTE — Progress Notes (Signed)
Outpatient [place] Visit    NAME:  Jodi Walls  DATE OF BIRTH:  1940/11/13  MEDICAL RECORD NUMBER:  [id number]  DATE:  08/10/2021    Patient arrived to [place]   '[]'$  per wheelchair   '[x]'$  ambulatory     Patient diagnosis: Rheumatoid Arthritis    Patient Active Problem List   Diagnosis    Other hyperlipidemia    Hypothyroidism    Essential hypertension, benign    HX: breast cancer    Renal insufficiency    Iron deficiency anemia due to chronic blood loss    MGUS (monoclonal gammopathy of unknown significance)    Chronic renal disease, stage III (HCC) [301279]    Seronegative rheumatoid arthritis (HCC)     Allergies   Allergen Reactions    Pcn [Penicillins] Swelling     Face and hands swelled    Triamterene      Severe stomach pain,SOB,severe cramps throughout her body    Ace Inhibitors Other (See Comments)     cough    Atenolol Other (See Comments)     Bradycardia (HR 40's)    Atorvastatin Other (See Comments)     Muscle aches    Zithromax [Azithromycin] Swelling    Codeine Nausea And Vomiting       What  symptoms of RA is pt experiencing?   '[x]'$  Rheumatoid nodules   '[x]'$  Joint Swelling   '[x]'$  Swelling   '[]'$  Joint redness  Or warmth   '[x]'$  Pain   '[x]'$  Fatigue   '[x]'$  Muscle aches   '[]'$  Other    What other treatments has pt been using to treat RA?: prednisone 5 mg    Is pt on Methotrexate: No      Is this first dose of Simponi Aria? No    Patient Education done on Simponi Aria:   '[x]'$  Written   '[x]'$  Verbal    Has the patient recently experienced:  Fever: No  Chills: No    Night Sweats: Yes   Illness: No  Infection: No   Open wounds: No   Inflammation: No     Are you on antibiotics presently?: No    Any recent oral or dental procedures done recently?: No    Any recent flu shots or vaccines? No    History of hepatitis B? No    Date of last TB skin test? February 28, 2021   '[]'$  Negative   '[]'$  Positive     BP 117/78   Pulse 86   Temp 98.1 F (36.7 C) (Temporal)    Resp 16   Wt 169 lb (76.7 kg)   BMI 33.01 kg/m      Premedicated:  '[]'$  Benadryl 25 mg IV  '[]'$  Benadryl 50 mg IV  '[]'$  Benadryl 25 mg oral   '[]'$  Benadryl 50 mg oral  '[]'$  Tylenol 325 mg oral  '[]'$  Tylenol 650 mg oral  '[]'$  Zyrtec 10 mg oral  '[]'$  Other    Dose ordered: '2mg'$  per kg   Total dose is 153.75 mg in 100 ml of NS and infused with a 0.2 micron low protein binding filter.    Simponi Aria infused over 31  minutes    Monitoring of vital signs and infusion done per protocol and documented in flowsheets.    Response to treatment:  Well tolerated by patient.     Scheduled to return for next injection on October 05, 2021  Electronically signed by Kathyrn Lass, RN on 08/10/2021 at 11:24 AM

## 2021-08-10 NOTE — Progress Notes (Signed)
Discharge instructions reviewed with patient and family. Verbalized understanding.

## 2021-08-10 NOTE — Discharge Instructions (Signed)
Outpatient Infusion Discharge Instructions  [facility]  [place], [place] [address]  Telephone: [phone]      FAX [phone]    NAME:  Jodi Walls  DATE OF BIRTH:  Jul 11, 1940  MEDICAL RECORD NUMBER:  [id number]  DATE:  '@ED'$ @    Reason for Outpatient Infusion Visit: Simponi    If you develop any these symptoms please contact you Doctor    '[x]'$  Nausea and/or vomiting not relieved with medication   '[x]'$  Swelling, redness, and/or bleeding at injection or IV site    '[x]'$  Fever or chills  '[x]'$  Rash or itching   '[x]'$  Shortness of breath  '[x]'$  Please review After Visit Summary (AVS) information on    '[]'$  Other      Outpatient Infusion Center Information: Should you experience any significant changes in your health or have questions about your care please contact the [place] at [place] 8:00 am - 4:00 pm.  If you need help outside these hours and cannot wait until we are again available, contact your Primary Care Physician or go to the hospital emergency room.       Electronically signed by Kathyrn Lass, RN on 08/10/2021 at 12:05 PM

## 2021-08-15 NOTE — Other (Unsigned)
Cortisone Inj Right Knee:    After discussion of the risks and benefits, the patient has elected to proceed  with a cortisone injection  into the right knee. Written consent was signed and witnessed in the office  today. Confirmed no prior adverse reactions, no active infections, or relevant  allergies. There was no warmth, erythema or effusion. The skin was clear.  Initially the skin was sterilized with alcohol. Topical anesthesia was   achieved  with ethylene chloride. A needle was inserted into the right knee via a   superior  medial approach and I injected a solution mix of 2 ml Kenalog (Triamcinolone)   40  mg and lidocaine. The injection was completed without complication and a   bandage  was applied. There was no reaction noted in the office and the patient was  instructed to call with any signs of infection or allergic reaction.

## 2021-08-15 NOTE — Other (Unsigned)
Subjective  Subjective:  Patient ID: Jodi Walls  Age: 81 y.o. (DOB: 1940-12-29)  Ethnicity: Non-Hispanic  Race: White/Caucasian  Gender: female    Chief Complaint:  Chief Complaint  Patient presents with   Follow-up    Rt knee; L/S 05/09/21          HPI:      Ortho Exam:      Review of Systems:                Patient Vitals:  Vitals  Pain Score:   5        X-Ray/Injection In Clinic Procedure Notes    X-Ray/Inj Notes    Author Status Last Editor Updated Created   Antony Contras., MD Sign when Signing Visit Antony Contras., MD 08/15/2021  12:44 PM 08/15/2021 12:43 PM     Assoc. Orders   PR ARTHROCENTESIS ASPIR&/INJ MAJOR JT/BURSA W/O [name]          Cortisone Inj Right Knee:    After discussion of the risks and benefits, the patient has elected to proceed  with a cortisone injection  into the right knee. Written consent was signed and witnessed in the office  today. Confirmed no prior adverse reactions, no active infections, or relevant  allergies. There was no warmth, erythema or effusion. The skin was clear.  Initially the skin was sterilized with alcohol. Topical anesthesia was   achieved  with ethylene chloride. A needle was inserted into the right knee via a   superior  medial approach and I injected a solution mix of 2 ml Kenalog (Triamcinolone)   40  mg and lidocaine. The injection was completed without complication and a   bandage  was applied. There was no reaction noted in the office and the patient was  instructed to call with any signs of infection or allergic reaction.                Administrations This Visit   lidocaine 2 % injection 2 mL   Admin Date  08/15/2021 Action  Given Dose  2 mL Route  Injection Administered By  Antony Contras., MD       triamcinolone [facility]) injection 80 mg   Admin Date  08/15/2021 Action  Given Dose  80 mg Route  Intra-articular Administered By  Antony Contras., MD          In Clinic Administered Meds Billing Data   lidocaine 2 % injection 2 mL   Admin  Date  08/15/2021 Site  Right Knee [facility]  63323-202-02 Lot#  [id number] Patient Supplied?  No       triamcinolone (KENALOG-40) injection 80 mg   Admin Date  08/15/2021 Site  Right Knee [facility]  [address]-1 Lot#  [id number] a Patient Supplied?  No              Assessment/Impression:    Encounter Diagnoses  Code Name Primary?   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  She does not want any surgery at her age.  She is getting not much relief from cortisone.  She is a candidate and would  like to try Orthovisc or Euflexxa injections in the future.    Plan:    Orders Placed This Encounter   PR ARTHROCENTESIS ASPIR&/INJ MAJOR JT/BURSA W/O [name]    Standing Status:   Standing    Number of  Occurrences:   1   triamcinolone (KENALOG-40) injection 80 mg   lidocaine 2 % injection 2 mL   HA Injection (Pre-Insurance)

## 2021-08-21 ENCOUNTER — Emergency Department: Admit: 2021-08-21 | Payer: MEDICARE | Primary: Family Medicine

## 2021-08-21 ENCOUNTER — Inpatient Hospital Stay: Admit: 2021-08-21 | Discharge: 2021-08-21 | Disposition: A | Discharge status: Home / Self Care | Payer: MEDICARE

## 2021-08-21 DIAGNOSIS — J029 Acute pharyngitis, unspecified: Secondary | ICD-10-CM

## 2021-08-21 DIAGNOSIS — J028 Acute pharyngitis due to other specified organisms: Secondary | ICD-10-CM

## 2021-08-21 LAB — COVID-19, RAPID: SARS-CoV-2, NAAT: NOT DETECTED

## 2021-08-21 LAB — RAPID INFLUENZA A/B ANTIGENS
Rapid Influenza A Ag: NEGATIVE
Rapid Influenza B Ag: NEGATIVE

## 2021-08-21 LAB — STREP SCREEN GROUP A THROAT: Rapid Strep A Screen: NEGATIVE

## 2021-08-21 MED ORDER — DEXAMETHASONE SODIUM PHOSPHATE 20 MG/5ML IJ SOLN
20 MG/5ML | Freq: Once | INTRAMUSCULAR | Status: AC
Start: 2021-08-21 — End: 2021-08-21
  Administered 2021-08-21: 23:00:00 10 mg via ORAL

## 2021-08-21 MED FILL — DEXAMETHASONE SODIUM PHOSPHATE 20 MG/5ML IJ SOLN: 20 MG/5ML | INTRAMUSCULAR | Qty: 5

## 2021-08-21 NOTE — ED Provider Notes (Signed)
[facility]        Pt Name: Jodi Walls  MRN: [id number]  [place] 07/12/1940  Date of evaluation: 08/21/2021  Provider: Hassan Rowan, APRN - CNP  PCP: Barron Alvine, MD  Note Started: 6:08 PM EDT 08/21/21      APP. I have evaluated this patient.  My supervising physician was available for consultation.      CHIEF COMPLAINT       Chief Complaint   Patient presents with    Other     Pt with breast implant in 1995.  States that on Saturday she noticed difference in right breast.  Pt having no sx.      Pharyngitis     Sore throat x 3 days.  Denies fever.        HISTORY OF PRESENT ILLNESS: 1 or more Elements     History From: pt            Chief Complaint:above    Jodi Walls is a 81 y.o. female who presents to ED with multiple complaints  Sore throat since Saturday. NO sick contact. 4 covid vaccines. No fevers, chills or difficulty swallowing  2.  Deformity to right chest wall since Saturday. Concerned her implant may be leaking. No pain  Surgery in 1990s. Her plastic surgeon has since retired.       Nursing Notes were all reviewed and agreed with or any disagreements were addressed in the HPI.    REVIEW OF SYSTEMS :      Review of Systems   Constitutional:  Negative for chills, diaphoresis and fever.   HENT:  Positive for sore throat. Negative for congestion, rhinorrhea, tinnitus, trouble swallowing and voice change.    Eyes:  Negative for pain and visual disturbance.   Respiratory:  Negative for cough, shortness of breath and wheezing.    Cardiovascular:  Negative for chest pain and leg swelling.   Gastrointestinal:  Negative for abdominal pain, constipation, diarrhea, nausea and vomiting.   Genitourinary:  Negative for frequency and hematuria.   Musculoskeletal:  Negative for back pain and neck pain.   Skin:  Negative for rash and wound.   Neurological:  Negative for dizziness and light-headedness.     Positives and Pertinent negatives  as per HPI.     SURGICAL HISTORY     Past Surgical History:   Procedure Laterality Date    APPENDECTOMY      ARTHROPLASTY Left 12/25/2018    LEFT MIDDLE FINGER METACARPO-PHALANGEAL JOINT SYNOVECTOMY, LIGAMENT REBALANCING, EXTENSOR TENDON CENTRALIZATION AND SILICONE METACARPOPHALANGEAL JOINT ARTHROPLASTY performed by Delene Loll, MD at [place]      r/t to herniated disc in lower back, neck area--screws and rods in place    BREAST BIOPSY      left breast biopsy    [place]    right mastectomy    CHOLECYSTECTOMY  2001    COLONOSCOPY  04/12/2015    Esophagogastroduodenoscopy with biopsy, Colonoscopy, diagnostic. no findings except for diverticula and check in 10 years    HYSTERECTOMY (CERVIX STATUS UNKNOWN)      TAH-BSO. no  cancer    JOINT REPLACEMENT Left     left total knee replacement    LIPOMA RESECTION      right side below right breast    MASTECTOMY, RADICAL  1995    UPPER GASTROINTESTINAL ENDOSCOPY  04/12/2015    and  colonoscopy. hiatal hernia and gastritis       CURRENTMEDICATIONS       Discharge Medication List as of 08/21/2021  7:11 PM        CONTINUE these medications which have NOT CHANGED    Details   vitamin D (CHOLECALCIFEROL) 25 MCG (1000 UT) TABS tablet Take 1 tablet by mouth dailyHistorical Med      predniSONE (DELTASONE) 5 MG tablet TAKE 1 TABLET BY MOUTH DAILY, Disp-90 tablet, R-1Normal      DULoxetine (CYMBALTA) 60 MG extended release capsule TAKE 1 CAPSULE BY MOUTH DAILY, Disp-90 capsule, R-1Normal      levothyroxine (SYNTHROID) 100 MCG tablet TAKE 1 TABLET BY MOUTH DAILY, Disp-90 tablet, R-1Normal      gabapentin (NEURONTIN) 300 MG capsule TAKE 2 CAPSULES BY MOUTH DAILY WITH SUPPER, Disp-180 capsule, R-1Normal      HYDROcodone-acetaminophen (NORCO) 5-325 MG per tablet TAKE 1 TABLET BY MOUTH TWICE DAILY FOR 15 DAYSHistorical Med      diclofenac sodium (VOLTAREN) 1 % GEL APPLY 4 GRAMS TO LOWER EXTREMITY JOINTS AND 2 GRAMS TO UPPER EXTREMITY JOINTS FOUR TIMES DAILY AS  NEEDED, Disp-500 g, R-1, Normal      furosemide (LASIX) 40 MG tablet Take 1 tablet by mouth daily, Disp-30 tablet, R-2Normal      potassium chloride (KLOR-CON) 10 MEQ extended release tablet Take 1 tablet by mouth 2 times dailyHistorical Med      melatonin 3 MG TABS tablet Take 3 mg by mouth nightly       aspirin 81 MG tablet Take 81 mg by mouth daily.             ALLERGIES     Pcn [penicillins], Triamterene, Ace inhibitors, Atenolol, Atorvastatin, Zithromax [azithromycin], and Codeine    FAMILYHISTORY       Family History   Problem Relation Age of Onset    Cancer Mother         lung     Cancer Father         bladder    Heart Disease Father     High Blood Pressure Father     Cancer Maternal Aunt         ovarian    Cancer Paternal Cousin         ovarian        SOCIAL HISTORY       Social History     Tobacco Use    Smoking status: Never    Smokeless tobacco: Never   Vaping Use    Vaping Use: Never used   Substance Use Topics    Alcohol use: Yes     Comment: social-rare. once a year    Drug use: No       SCREENINGS        Glasgow Coma Scale  Eye Opening: Spontaneous  Best Verbal Response: Oriented  Best Motor Response: Obeys commands  Glasgow Coma Scale Score: 15                CIWA Assessment  BP: (!) 158/94  Heart Rate: 85           PHYSICAL EXAM  1 or more Elements     ED Triage Vitals [08/21/21 1802]   BP Temp Temp Source Heart Rate Resp SpO2 Height Weight   (!) 158/94 98.8 F (37.1 C) Oral 85 16 98 % 5' (1.524 m) 171 lb 1.2 oz (77.6 kg)       Physical Exam  Vitals and  nursing note reviewed. Exam conducted with a chaperone present.   Constitutional:       Appearance: Normal appearance. She is obese. She is not ill-appearing, toxic-appearing or diaphoretic.   HENT:      Head: Normocephalic and atraumatic.      Right Ear: Tympanic membrane, ear canal and external ear normal.      Left Ear: Tympanic membrane, ear canal and external ear normal.      Nose: Nose normal. No congestion or rhinorrhea.      Mouth/Throat:       Mouth: Mucous membranes are moist.      Pharynx: Oropharynx is clear. Posterior oropharyngeal erythema present. No oropharyngeal exudate or uvula swelling.      Tonsils: No tonsillar exudate or tonsillar abscesses. 1+ on the right. 1+ on the left.   Eyes:      General: No scleral icterus.        Right eye: No discharge.         Left eye: No discharge.      Extraocular Movements: Extraocular movements intact.      Conjunctiva/sclera: Conjunctivae normal.      Pupils: Pupils are equal, round, and reactive to light.   Cardiovascular:      Rate and Rhythm: Normal rate and regular rhythm.      Pulses: Normal pulses.      Heart sounds: Normal heart sounds. No murmur heard.    No friction rub. No gallop.   Pulmonary:      Effort: Pulmonary effort is normal. No respiratory distress.      Breath sounds: Normal breath sounds. No stridor. No wheezing, rhonchi or rales.   Chest:      Comments: Mastectomy scar to the right side.  Palpable deformity to implant no pain  Abdominal:      General: Abdomen is flat. Bowel sounds are normal. There is no distension.      Palpations: Abdomen is soft.      Tenderness: There is no abdominal tenderness. There is no right CVA tenderness, left CVA tenderness or guarding.   Musculoskeletal:         General: Normal range of motion.      Cervical back: Normal range of motion and neck supple. No rigidity or tenderness.   Skin:     General: Skin is warm and dry.      Capillary Refill: Capillary refill takes less than 2 seconds.      Coloration: Skin is not jaundiced or pale.      Findings: No rash.   Neurological:      General: No focal deficit present.      Mental Status: She is alert and oriented to person, place, and time. Mental status is at baseline.   Psychiatric:         Mood and Affect: Mood normal.         Behavior: Behavior normal.         DIAGNOSTIC RESULTS   LABS:    Labs Reviewed   STREP SCREEN GROUP A THROAT   RAPID INFLUENZA A/B ANTIGENS   COVID-19, RAPID   CULTURE, BETA STREP  CONFIRM PLATES       When ordered only abnormal lab results are displayed. All other labs were within normal range or not returned as of this dictation.    EKG: When ordered, EKG's are interpreted by the Emergency Department Physician in the absence of a cardiologist.  Please see their note for interpretation of  EKG.    RADIOLOGY:   Non-plain film images such as CT, Ultrasound and MRI are read by the radiologist. Plain radiographic images are visualized and preliminarily interpreted by the ED Provider with the below findings:        Interpretation per the Radiologist below, if available at the time of this note:    CT CHEST WO CONTRAST   Preliminary Result   1. Right breast implant is partly collapsed and asymmetric creating anterior   chest deformity.   2. No active lung parenchyma, pleural or mediastinal disease.   3. Large hiatal hernia.           No results found.    No results found.    PROCEDURES   Unless otherwise noted below, none     Procedures    CRITICAL CARE TIME (.cctime)       PAST MEDICAL HISTORY      has a past medical history of Anemia, Arthritis, Cancer ([place]) (1995), GERD (gastroesophageal reflux disease), blood clots (2001), Hypothyroidism, Kidney failure, PONV (postoperative nausea and vomiting), Seronegative rheumatoid arthritis ([place]) (04/11/2021), and Wears glasses.     EMERGENCY DEPARTMENT COURSE and DIFFERENTIAL DIAGNOSIS/MDM:   Vitals:    Vitals:    08/21/21 1802   BP: (!) 158/94   Pulse: 85   Resp: 16   Temp: 98.8 F (37.1 C)   TempSrc: Oral   SpO2: 98%   Weight: 171 lb 1.2 oz (77.6 kg)   Height: 5' (1.524 m)       Patient was given the following medications:  Medications   dexamethasone (DECADRON) Oral 10 mg (10 mg Oral Given 08/21/21 1904)             Is this patient to be included in the SEP-1 Core Measure due to severe sepsis or septic shock?   No   Exclusion criteria - the patient is NOT to be included for SEP-1 Core Measure due to:  2+ SIRS criteria are not met        Chronic  Conditions affecting care:    has a past medical history of Anemia, Arthritis, Cancer ([place]) (1995), GERD (gastroesophageal reflux disease), blood clots (2001), Hypothyroidism, Kidney failure, PONV (postoperative nausea and vomiting), Seronegative rheumatoid arthritis ([place]) (04/11/2021), and Wears glasses.    CONSULTS: (Who and What was discussed)  None        Records Reviewed (External and Source)     CC/HPI Summary, DDx, ED Course, and Reassessment:     Regarding sore throat-  81 y.o. female presents with signs and symptoms of upper respiratory infection. The patient is afebrile and nontoxic in appearance.  Rapid COVID and flu are negative managing secretions without difficulty. Presentation not consistent with epiglottitis or retropharyngeal abscess. There is no asymmetric tonsillar erythema or uvular deviation and I am not concerned for peritonsillar abscess. There is no pain with manipulation of the trachea and I am not concerned for bacterial tracheitis. No meningismus - not concerning for meningitis.   Given a dose of Decadron for symptom relief  The plan is supportive care and expectant management.     Regarding breast implant-  Appears ruptured and CT per my read.  Radiology read as above.    No pain at this time.  Ongoing since Saturday.  No evidence of infection.  As such no emergent need for surgical explant.  Will refer to breast surgery group for further management    The patient is at low risk for mortality based  on demographic, history and clinical factors. Given the best available information and clinical assessment, I estimate the risk of hospitalization to be greater than risk of treatment at home. I have explained to the patient that the risk could rapidly change, given precautions for return and instructions. Explained to patient that the risk for mortality is low based on demographic, history and clinical factors.      I discussed with patient the results of evaluation in the ED, diagnosis,  care, and prognosis.  The plan is to discharge to home.  Patient is in agreement with plan and questions have been answered.      I also discussed with patient the reasons which may require a return visit and the importance of follow-up care.  The patient is well-appearing, nontoxic, and improved at the time of discharge.  Patient agrees to call to arrange follow-up care as directed.   Patient understands to return immediately for worsening/change in symptoms.       Disposition Considerations (tests considered but not done, Admit vs D/C, Shared Decision Making, Pt Expectation of Test or Tx.): above       I am the Primary Clinician of Record.  FINAL IMPRESSION      1. Viral pharyngitis    2. Rupture of implant of right breast, initial encounter          DISPOSITION/PLAN     DISPOSITION Decision To Discharge 08/21/2021 07:00:37 PM      PATIENT [place]ED [place]  [address]  [place] [address]  [phone]    Call in 1 day  Follow up on your recent ED visit    Maryanna Shape, MD  [place] [address]  [address] [place] [address]  [phone]            DISCHARGE MEDICATIONS:  Discharge Medication List as of 08/21/2021  7:11 PM          DISCONTINUED MEDICATIONS:  Discharge Medication List as of 08/21/2021  7:11 PM                 (Please note that portions of this note were completed with a voice recognition program.  Efforts were made to edit the dictations but occasionally words are mis-transcribed.)    Hassan Rowan, APRN - CNP (electronically signed)       Hassan Rowan, APRN - CNP  08/21/21 1930

## 2021-08-21 NOTE — ED Notes (Signed)
Patient discharged in no apparent distress. Respirations easy/even. Skin warm/dry. All questions answered at time of discharge.      Norville Haggard, RN  08/21/21 [phone]

## 2021-08-21 NOTE — Discharge Instructions (Signed)
You were seen in the Emergency Department for sore throat 7 ruptured implant on right side.     Follow up with your PCP in 2-3 days and the breast surgeon as we discussed.     Return to the Emergency Department if you develop any new or worsening symptoms, chest pain, shortness of breat, or for any other concerns.

## 2021-08-21 NOTE — ED Triage Notes (Signed)
Pt with breast implant in 1995.  States that on Saturday she noticed difference in right breast.  Pt having no sx.     Sore throat x 3 days.  Denies fever.

## 2021-08-23 LAB — CULTURE, BETA STREP CONFIRM PLATES: Strep A Culture: NORMAL

## 2021-08-28 ENCOUNTER — Encounter: Payer: MEDICARE | Attending: Registered Nurse | Primary: Family Medicine

## 2021-08-30 ENCOUNTER — Ambulatory Visit: Admit: 2021-08-30 | Discharge: 2021-08-30 | Payer: MEDICARE | Attending: Surgery | Primary: Family Medicine

## 2021-08-30 DIAGNOSIS — Z853 Personal history of malignant neoplasm of breast: Secondary | ICD-10-CM

## 2021-08-30 NOTE — Progress Notes (Unsigned)
[facility]    CC: Breast CA     Referring physician: [facility]    HPI: This is a 81 y.o. female with a PMHx as delineated below who presents in consultation for breast reconstruction. She was found to have right breast cancer and underwent a *** mastectomy with implant based reconstruction in ***.  ***  Plastic surgery was consulted for evaluation and treatment.The specifics of her breast cancer workup / treatment is as follows:     Diagnosis: {Blank single:19197::"DCIS","LCIS","invasive ductal","invasive lobular","inflammatory"}  Mo/Yr Diagnosed: ***  Breast Involved:{Blank single:19197::"Left","Right","Bilateral"}  Surgery: ***  Date of Surgery: ***  Tumor Size & Grade: ***  Stage: ***  Nodal status: ***  ER: {Blank single:19197::"Positive","Negative","Unknown"} PR: {Blank single:19197::"Positive","Negative","Unknown"} HER2: {Blank single:19197::"Positive","Negative","Unknown"}    Oncologist: ***  Radiation: ***    Chemo/Meds: ***  Pregnancy/Miscarriages: ***    PMHx:   Past Medical History:   Diagnosis Date    Anemia     Arthritis     Cancer ([place]) 1995    hx of breast cancer--no chemo or radiation needed    GERD (gastroesophageal reflux disease)     Hx of blood clots 2001    after gallbladder surgery--blood clot in arm where IV had been    Hypothyroidism     Kidney failure     stage 3    PONV (postoperative nausea and vomiting)     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    Wears glasses     reading   RA (***)  CKD    PSHx:   Past Surgical History:   Procedure Laterality Date    APPENDECTOMY      ARTHROPLASTY Left 12/25/2018    LEFT MIDDLE FINGER METACARPO-PHALANGEAL JOINT SYNOVECTOMY, LIGAMENT REBALANCING, EXTENSOR TENDON CENTRALIZATION AND SILICONE METACARPOPHALANGEAL JOINT ARTHROPLASTY performed by Delene Loll, MD at [facility]      r/t to herniated disc in lower back, neck area--screws and rods in place    BREAST BIOPSY      left breast biopsy     [place]    right mastectomy    CHOLECYSTECTOMY  2001    COLONOSCOPY  04/12/2015    Esophagogastroduodenoscopy with biopsy, Colonoscopy, diagnostic. no findings except for diverticula and check in 10 years    HYSTERECTOMY (CERVIX STATUS UNKNOWN)      TAH-BSO. no  cancer    JOINT REPLACEMENT Left     left total knee replacement    LIPOMA RESECTION      right side below right breast    MASTECTOMY, RADICAL  1995    UPPER GASTROINTESTINAL ENDOSCOPY  04/12/2015    and colonoscopy. hiatal hernia and gastritis     Allergies:   Allergies   Allergen Reactions    Pcn [Penicillins] Swelling     Face and hands swelled    Triamterene      Severe stomach pain,SOB,severe cramps throughout her body    Ace Inhibitors Other (See Comments)     cough    Atenolol Other (See Comments)     Bradycardia (HR 40's)    Atorvastatin Other (See Comments)     Muscle aches    Zithromax [Azithromycin] Swelling    Codeine Nausea And Vomiting     FHx: Past history of breast CA: ***    Meds:   Current Outpatient Medications   Medication Sig Dispense Refill    vitamin D (CHOLECALCIFEROL) 25 MCG (1000 UT) TABS  tablet Take 1 tablet by mouth daily      predniSONE (DELTASONE) 5 MG tablet TAKE 1 TABLET BY MOUTH DAILY 90 tablet 1    DULoxetine (CYMBALTA) 60 MG extended release capsule TAKE 1 CAPSULE BY MOUTH DAILY 90 capsule 1    levothyroxine (SYNTHROID) 100 MCG tablet TAKE 1 TABLET BY MOUTH DAILY 90 tablet 1    gabapentin (NEURONTIN) 300 MG capsule TAKE 2 CAPSULES BY MOUTH DAILY WITH SUPPER 180 capsule 1    HYDROcodone-acetaminophen (NORCO) 5-325 MG per tablet TAKE 1 TABLET BY MOUTH TWICE DAILY FOR 15 DAYS      diclofenac sodium (VOLTAREN) 1 % GEL APPLY 4 GRAMS TO LOWER EXTREMITY JOINTS AND 2 GRAMS TO UPPER EXTREMITY JOINTS FOUR TIMES DAILY AS NEEDED 500 g 1    furosemide (LASIX) 40 MG tablet Take 1 tablet by mouth daily 30 tablet 2    potassium chloride (KLOR-CON) 10 MEQ extended release tablet Take 1 tablet by mouth 2 times daily       melatonin 3 MG TABS tablet Take 3 mg by mouth nightly       aspirin 81 MG tablet Take 81 mg by mouth daily.       No current facility-administered medications for this visit.     SocHx: Smoking: {YES/NO:19726}    ETOH: {Blank single:19197::"occasional","daily","none"}    Drug use: {YES/NO:19726}    ROS   Constitutional: Negative for chills and fever.   HENT: Negative for congestion, facial swelling, and voice change.    Eyes: Negative for photophobia and visual disturbance.   Respiratory: Negative for apnea, cough, chest tightness and shortness of breath.    Cardiovascular: Negative for chest pain and palpitations.   Gastrointestinal: Negative for dysphagia and early satiety.  Genitourinary: Negative for difficulty urinating, dysuria, flank pain, frequency and hematuria.   Musculoskeletal: Negative for new gait problem, joint swelling and myalgias.   Skin: Negative for color change, pallor and rash.   Endocrine: negative for tremors, temperature intolerance or polydipsia.  Allergic/Immunologic: Negative for new environmental or food allergies.  Neurological: Negative for dizziness, seizures, speech difficulty, numbness.   Hematological: Negative for adenopathy.   Psychiatric/Behavioral: Negative for agitation and confusion.      EXAM  ***    GEN: NAD, pleasant, *** obese/healthy  CVS: RRR  PULM: No respiratory distress  HEENT: PERRLA/EOMI; dentition ***, hearing appears within normal limits  NECK: Supple with trachea in midline, no masses  EXT: No lymphedema noted  ABD: soft/NT/ND ***  NEURO: No focal deficits, no obvious CN deficits  BACK: Bilateral latiss muscle intact  BREAST:   Physical Exam    Bra size: ***  Desired bra size: ***  Ptosis grade:   R: {Blank single:19197::"1","2","3","pseudoptosis"}   L: {Blank single:19197::"1","2","3","pseudoptosis"}  The left breast size is {Blanksingle:19197::"larger than","smaller than","similar to"} the right breast.  There {Blank single:19197::"were no","was one","were  multiple"} palpable mass*** with {Blank single:19197::"palpable lymphadenopathy","no palpable lymphadenopathy"} in the ipsilateral {left/right:311354} axilla.   Nipple retraction: {YES/NO:19726} on ***    Left breast sternal notch to nipple: *** cm  Right breast sternal notch to nipple: *** cm    Left breast nipple to inframammary fold: *** cm  Right breast nipple to inframammary fold: *** cm    Left breast width *** cm  Right breast width *** cm    IMAGING: Reviewed    IMP: 81 y.o. female with breast cancer who presents for discussion regarding ruptured implant reconstruction  PLAN: ***.    A discussion regarding surgical  options including immediate vs delayed approaches, implant based vs autologous, as well as additional planned revisional surgeries was performed with the patient {Blank single:19197::"and family"}. {Blank single:19197::"Multiple autologous donor sites were discussed as well."} {Blank single:19197::"Clinical photos were obtained."}  {Blank single:19197::"We additionally discussed the FDA recommendations regarding monitoring of silicone implants."} The risks, benefits, alternatives, outcomes, personnel involved were discussed.  Specifically, the risks including, but not limited to: bleeding that may necessitate transfusion or operation, infection, seroma, reoperation, nonhealing, poor cosmetic outcome, asymmetry, scarring, {Blank single:19197::"partial or total flap loss"}, {Blank single:19197::"donor site morbidity","donor site morbidity with possible hernia/bulge"}, VTE (DVT/PE), and death were discussed. {Blank single:19197::""A significant amount of time was also allocated to nicotine's effect on wound healing and the patient understands that a sub-optimal wound healing and cosmetic result may occur with continued utilization."} All questions were answered in a satisfactory manner according to the patient.     Total encounter time: *** min with > 50% spent with face to face (or vitual)  counseling and coordination of care.    Micheline Chapman, MD  [facility]  08/30/21

## 2021-08-31 NOTE — Telephone Encounter (Signed)
The patient was in the office to see Dr. Claudia Desanctis yesterday.    PLAN: Discussed options with Parul including removal of her ruptured implant and complete capsulectomy without replacement.  She notes that she desires to have a breast reconstruction, thus would plan for concomitant exchange for permanent implant.  Will need multiple clearances including oncology (aplastic anemia), rheumatology (RA), nephrology (CKD), IM, and pulmonology.  Will then schedule.    I received a surgery letter.     I spoke with Elle B at [facility] [employer] [phone]) to see if CPT Code 19342 or 12458 require pre certification. No prior authorization needed.    Call Reference # [id number]    I lmom for the patient at the home number listed. I requested a call back to discuss insurance and next steps.    I will leave this phone note open.

## 2021-09-04 ENCOUNTER — Ambulatory Visit: Admit: 2021-09-04 | Discharge: 2021-09-04 | Payer: MEDICARE | Attending: Family Medicine | Primary: Family Medicine

## 2021-09-04 DIAGNOSIS — Z09 Encounter for follow-up examination after completed treatment for conditions other than malignant neoplasm: Secondary | ICD-10-CM

## 2021-09-04 NOTE — Telephone Encounter (Signed)
Holly, pt's daughter, returned call mentioned below.     Please call: [phone]

## 2021-09-04 NOTE — Progress Notes (Signed)
Subjective:      Patient ID: Jodi Walls is a 81 y.o. female.    Chief Complaint   Patient presents with    Follow-Up from [facility] 08-21-2021 "breast implant ruptured"        Patient presents with:  Follow-Up from Hospital: [facility] 08-21-2021 "breast implant ruptured"    Here for the above  She is actually well  No c/o  She is here to see me for possible preop but here to early    Date of Birth:  1941-02-26    Date of Visit:  09/04/2021     -- Pcn [Penicillins] -- Swelling    --  Face and hands swelled   -- Triamterene     --  Severe stomach pain,SOB,severe cramps throughout             her body   -- Ace Inhibitors -- Other (See Comments)    --  cough   -- Atenolol -- Other (See Comments)    --  Bradycardia (HR 40's)   -- Atorvastatin -- Other (See Comments)    --  Muscle aches   -- Zithromax [Azithromycin] -- Swelling   -- Codeine -- Nausea And Vomiting    Current Outpatient Medications:  vitamin D (CHOLECALCIFEROL) 25 MCG (1000 UT) TABS tablet, Take 1 tablet by mouth daily, Disp: , Rfl:   predniSONE (DELTASONE) 5 MG tablet, TAKE 1 TABLET BY MOUTH DAILY, Disp: 90 tablet, Rfl: 1  DULoxetine (CYMBALTA) 60 MG extended release capsule, TAKE 1 CAPSULE BY MOUTH DAILY, Disp: 90 capsule, Rfl: 1  levothyroxine (SYNTHROID) 100 MCG tablet, TAKE 1 TABLET BY MOUTH DAILY, Disp: 90 tablet, Rfl: 1  HYDROcodone-acetaminophen (NORCO) 5-325 MG per tablet, TAKE 1 TABLET BY MOUTH TWICE DAILY FOR 15 DAYS, Disp: , Rfl:   diclofenac sodium (VOLTAREN) 1 % GEL, APPLY 4 GRAMS TO LOWER EXTREMITY JOINTS AND 2 GRAMS TO UPPER EXTREMITY JOINTS FOUR TIMES DAILY AS NEEDED, Disp: 500 g, Rfl: 1  furosemide (LASIX) 40 MG tablet, Take 1 tablet by mouth daily, Disp: 30 tablet, Rfl: 2  potassium chloride (KLOR-CON) 10 MEQ extended release tablet, Take 1 tablet by mouth 2 times daily, Disp: , Rfl:   melatonin 3 MG TABS tablet, Take 1 tablet by mouth nightly, Disp: , Rfl:   aspirin 81 MG tablet, Take 1 tablet by mouth daily, Disp: , Rfl:    gabapentin (NEURONTIN) 300 MG capsule, TAKE 2 CAPSULES BY MOUTH DAILY WITH SUPPER, Disp: 180 capsule, Rfl: 1    No current facility-administered medications for this visit.      --------------------------               09/04/21                     1129        --------------------------   BP:          118/82        Site:    Left Upper Arm    Position:    Sitting        Cuff Size:  Large Adult      Temp:   97 F (36.1 C)    TempSrc:    Temporal       Weight: 170 lb (77.1 kg)   Height:   5' (1.524 m)    --------------------------  Body mass index is 33.2 kg/m.     Wt Readings from Last  3 Encounters:  09/04/21 : 170 lb (77.1 kg)  08/30/21 : 175 lb (79.4 kg)  08/21/21 : 171 lb 1.2 oz (77.6 kg)    BP Readings from Last 3 Encounters:  09/04/21 : 118/82  08/30/21 : 115/75  08/21/21 : (!) 158/94          Review of Systems    Objective:   Physical Exam  Constitutional:       General: She is not in acute distress.     Appearance: Normal appearance. She is well-developed. She is not ill-appearing or diaphoretic.   Cardiovascular:      Rate and Rhythm: Normal rate and regular rhythm.      Heart sounds: Normal heart sounds. No murmur heard.    No friction rub. No gallop.   Pulmonary:      Effort: Pulmonary effort is normal. No tachypnea, accessory muscle usage or respiratory distress.      Breath sounds: Normal breath sounds. No decreased breath sounds, wheezing, rhonchi or rales.   Lymphadenopathy:      Cervical: No cervical adenopathy.      Upper Body:      Right upper body: No supraclavicular adenopathy.      Left upper body: No supraclavicular adenopathy.   Skin:     General: Skin is warm and dry.      Coloration: Skin is not pale.   Neurological:      Mental Status: She is alert.       Assessment:       Diagnosis Orders   1. Hospital discharge follow-up        2. Rupture of implant of left breast, subsequent encounter            Ct chest noted on 08/21/21  Seeing dr Dorette Grate and  leuenberger  Also rheumatology   She is not getting surgery until July   Here too early. She will see June 19th      Plan:      Denyce Robert md   Dr Saintclair Halsted   Or dr Everlena Cooper will talk about the treatments and the steroid requirements for the surgery   Call me about the appointment         Barron Alvine, MD

## 2021-09-04 NOTE — Patient Instructions (Signed)
Jodi Walls   Dr Saintclair Halsted   Or dr Everlena Cooper will talk about the treatments and the steroid requirements for the surgery   Call me about the appointment   See me about June 19th

## 2021-09-05 NOTE — Telephone Encounter (Signed)
I returned the call mentioned below to [place]. I lmom requesting a call back.     I will leave this phone note open.

## 2021-09-13 NOTE — Telephone Encounter (Signed)
Earnest Bailey, patient's daughter, called in to discuss surgery scheduling     Please contact [place] at [phone]

## 2021-09-14 ENCOUNTER — Encounter

## 2021-09-14 MED ORDER — GABAPENTIN 300 MG PO CAPS
300 MG | ORAL_CAPSULE | ORAL | 0 refills | Status: AC
Start: 2021-09-14 — End: 2022-10-26

## 2021-09-14 NOTE — Telephone Encounter (Signed)
Last visit 06/13/21  Lab 02/13/21  Last refill 03/01/21

## 2021-09-14 NOTE — Telephone Encounter (Signed)
I returned the call mentioned below to [place]. I lmom requesting a call back.      I will leave this phone note open.

## 2021-09-18 NOTE — Telephone Encounter (Signed)
I spoke with Earnest Bailey (the patients daughter) to discuss the needed clearances surgery scheduling. She stated that the patient does not have a pulmonologist and would like to know the reason for the needed clearance.     Please advise.    I will route to MD.

## 2021-09-18 NOTE — Telephone Encounter (Signed)
Earnest Bailey, patient's daughter, called in to discuss surgery scheduling      Please contact Earnest Bailey sometime after 11:00 at [phone]

## 2021-09-21 NOTE — Telephone Encounter (Signed)
I spoke with [facility] at the phone number listed. The patient is now scheduled for surgery with Dr.Kundu on 10-26-2021.    The patients ddaughtere is aware of H&P.    The patient is scheduled for her post op appointment 11-02-2021.    I will submit the surgery letter to [facility] today.    I will mail the surgery information and instructions to the patient today.    I will close this phone note.

## 2021-09-21 NOTE — Telephone Encounter (Signed)
[facility]    My apologies, no pulmonologist required. Will need her rheumatology evaluation.    Thanks!  NK

## 2021-10-05 ENCOUNTER — Inpatient Hospital Stay: Payer: MEDICARE | Primary: Family Medicine

## 2021-10-16 MED ORDER — LEVOTHYROXINE SODIUM 100 MCG PO TABS
100 MCG | ORAL_TABLET | Freq: Every day | ORAL | 1 refills | Status: DC
Start: 2021-10-16 — End: 2022-04-11

## 2021-10-17 ENCOUNTER — Ambulatory Visit: Admit: 2021-10-17 | Discharge: 2021-10-17 | Payer: MEDICARE | Attending: Family Medicine | Primary: Family Medicine

## 2021-10-17 ENCOUNTER — Encounter

## 2021-10-17 ENCOUNTER — Inpatient Hospital Stay: Payer: MEDICARE | Primary: Family Medicine

## 2021-10-17 VITALS — BP 122/68 | HR 68 | Temp 97.90000°F | Ht 60.0 in | Wt 174.0 lb

## 2021-10-17 DIAGNOSIS — Z01818 Encounter for other preprocedural examination: Secondary | ICD-10-CM

## 2021-10-17 DIAGNOSIS — Z0000 Encounter for general adult medical examination without abnormal findings: Secondary | ICD-10-CM

## 2021-10-17 LAB — CBC WITH AUTO DIFFERENTIAL
Basophils %: 0.6 %
Basophils Absolute: 0 10*3/uL (ref 0.0–0.2)
Eosinophils %: 3.5 %
Eosinophils Absolute: 0.2 10*3/uL (ref 0.0–0.6)
Hematocrit: 34.5 % — ABNORMAL LOW (ref 36.0–48.0)
Hemoglobin: 11.4 g/dL — ABNORMAL LOW (ref 12.0–16.0)
Lymphocytes %: 28.2 %
Lymphocytes Absolute: 1.9 10*3/uL (ref 1.0–5.1)
MCH: 28 pg (ref 26.0–34.0)
MCHC: 33.1 g/dL (ref 31.0–36.0)
MCV: 84.5 fL (ref 80.0–100.0)
MPV: 8.7 fL (ref 5.0–10.5)
Monocytes %: 6.9 %
Monocytes Absolute: 0.5 10*3/uL (ref 0.0–1.3)
Neutrophils %: 60.8 %
Neutrophils Absolute: 4.2 10*3/uL (ref 1.7–7.7)
Platelets: 236 10*3/uL (ref 135–450)
RBC: 4.08 M/uL (ref 4.00–5.20)
RDW: 15.1 % (ref 12.4–15.4)
WBC: 6.9 10*3/uL (ref 4.0–11.0)

## 2021-10-17 LAB — COMPREHENSIVE METABOLIC PANEL
ALT: 12 U/L (ref 10–40)
AST: 19 U/L (ref 15–37)
Albumin/Globulin Ratio: 1.7 (ref 1.1–2.2)
Albumin: 4 g/dL (ref 3.4–5.0)
Alkaline Phosphatase: 95 U/L (ref 40–129)
Anion Gap: 16 (ref 3–16)
BUN: 21 mg/dL — ABNORMAL HIGH (ref 7–20)
CO2: 25 mmol/L (ref 21–32)
Calcium: 9.7 mg/dL (ref 8.3–10.6)
Chloride: 101 mmol/L (ref 99–110)
Creatinine: 1.2 mg/dL (ref 0.6–1.2)
Est, Glom Filt Rate: 45 — AB (ref >60)
Glucose: 99 mg/dL (ref 70–99)
Potassium: 3.7 mmol/L (ref 3.5–5.1)
Sodium: 142 mmol/L (ref 136–145)
Total Bilirubin: 0.5 mg/dL (ref 0.0–1.0)
Total Protein: 6.4 g/dL (ref 6.4–8.2)

## 2021-10-17 LAB — EKG 12-LEAD
Atrial Rate: 63 {beats}/min
P Axis: 49 degrees
P-R Interval: 154 ms
Q-T Interval: 476 ms
QRS Duration: 150 ms
QTc Calculation (Bazett): 487 ms
R Axis: -4 degrees
T Axis: 106 degrees
Ventricular Rate: 63 {beats}/min

## 2021-10-17 LAB — PROTIME-INR
INR: 0.97 (ref 0.84–1.16)
Protime: 12.8 s (ref 11.5–14.8)

## 2021-10-17 LAB — APTT: aPTT: 28.4 s (ref 22.7–35.9)

## 2021-10-17 NOTE — Patient Instructions (Signed)
Personalized Preventive Plan for Jodi Walls - 10/17/2021  Medicare offers a range of preventive health benefits. Some of the tests and screenings are paid in full while other may be subject to a deductible, co-insurance, and/or copay.    Some of these benefits include a comprehensive review of your medical history including lifestyle, illnesses that may run in your family, and various assessments and screenings as appropriate.    After reviewing your medical record and screening and assessments performed today your provider may have ordered immunizations, labs, imaging, and/or referrals for you.  A list of these orders (if applicable) as well as your Preventive Care list are included within your After Visit Summary for your review.    Other Preventive Recommendations:    A preventive eye exam performed by an eye specialist is recommended every 1-2 years to screen for glaucoma; cataracts, macular degeneration, and other eye disorders.  A preventive dental visit is recommended every 6 months.  Try to get at least 150 minutes of exercise per week or 10,000 steps per day on a pedometer .  Order or download the FREE "Exercise & Physical Activity: Your Everyday Guide" from [employer]. Call 239-705-4194 or search [employer] online.  You need 1200-1500 mg of calcium and 1000-2000 IU of vitamin D per day. It is possible to meet your calcium requirement with diet alone, but a vitamin D supplement is usually necessary to meet this goal.  When exposed to the sun, use a sunscreen that protects against both UVA and UVB radiation with an SPF of 30 or greater. Reapply every 2 to 3 hours or after sweating, drying off with a towel, or swimming.  Always wear a seat belt when traveling in a car. Always wear a helmet when riding a bicycle or motorcycle.

## 2021-10-17 NOTE — Patient Instructions (Signed)
Do give dr Tamala Julian a call to let him know you are having surgery and needs his [facility]  Consider the shingle vaccine   Take your medicines when you get home on the day of the surgery   No vitamins or advil or aleve type medicines for a week before the surgery   See [name] in august

## 2021-10-17 NOTE — Progress Notes (Signed)
Subjective:      Patient ID: Jodi Walls is a 81 y.o. female.    Chief Complaint   Patient presents with    Pre-op Exam     Right Breast Implant Removal & Exchange on 10-26-2021 by Dr. Micheline Chapman at [facility].     Follow-Up from [facility] 10-14-2021 "Fall"        Patient presents with:  Pre-op Exam: Right Breast Implant Removal & Exchange on 10-26-2021 by Dr. Micheline Chapman at [facility].   Follow-Up from Hospital: [place] 10-14-2021 "Fall"    Here for the above  She is well overall and to have surgery for ruptured implant   She fell 3 days ago at home  Went to ER and evaluation ok      height is 5' (1.524 m) and weight is 174 lb (78.9 kg). Her temporal temperature is 97.9 F (36.6 C). Her blood pressure is 122/68 and her pulse is 68.     Allergies:   -- Pcn [Penicillins] -- Swelling    --  Face and hands swelled   -- Triamterene     --  Severe stomach pain,SOB,severe cramps throughout             her body   -- Ace Inhibitors -- Other (See Comments)    --  cough   -- Atenolol -- Other (See Comments)    --  Bradycardia (HR 40's)   -- Atorvastatin -- Other (See Comments)    --  Muscle aches   -- Zithromax [Azithromycin] -- Swelling   -- Codeine -- Nausea And Vomiting    No tobacco use. Seldom uses ETOH .    Current Outpatient Medications:     levothyroxine (SYNTHROID) 100 MCG tablet, TAKE 1 TABLET BY MOUTH DAILY,     gabapentin (NEURONTIN) 300 MG capsule, TAKE 2 CAPSULES BY MOUTH DAILY WITH SUPPER,     vitamin D (CHOLECALCIFEROL) 25 MCG (1000 UT) TABS tablet, Take 1 tablet by mouth daily    predniSONE (DELTASONE) 5 MG tablet, TAKE 1 TABLET BY MOUTH DAILY (Patient taking differently: Take 1 tablet by mouth every other day)    DULoxetine (CYMBALTA) 60 MG extended release capsule, TAKE 1 CAPSULE BY MOUTH DAILY    HYDROcodone-acetaminophen (NORCO) 5-325 MG per tablet, TAKE 1 TABLET BY MOUTH TWICE DAILY FOR 15 DAYS,     diclofenac sodium (VOLTAREN) 1 % GEL, APPLY 4 GRAMS TO LOWER EXTREMITY  JOINTS AND 2 GRAMS TO UPPER EXTREMITY JOINTS FOUR TIMES DAILY AS NEEDED,     furosemide (LASIX) 40 MG tablet, Take 1 tablet by mouth daily    potassium chloride (KLOR-CON) 10 MEQ extended release tablet, Take 1 tablet by mouth 2 times daily    melatonin 3 MG TABS tablet, Take 1 tablet by mouth nightly,    aspirin 81 MG tablet, Take 1 tablet by mouth daily    Past Surgical History:  No date: APPENDECTOMY  12/25/2018: ARTHROPLASTY; Left      Comment:  LEFT MIDDLE FINGER METACARPO-PHALANGEAL JOINT                SYNOVECTOMY, LIGAMENT REBALANCING, EXTENSOR TENDON                CENTRALIZATION AND SILICONE METACARPOPHALANGEAL JOINT                ARTHROPLASTY performed by Delene Loll, MD at [place]  No date: [place]  Comment:  r/t to herniated disc in lower back, neck area--screws                and rods in place  No date: BREAST BIOPSY      Comment:  left breast biopsy  1995: BREAST SURGERY      Comment:  right mastectomy  2001: CHOLECYSTECTOMY  04/12/2015: COLONOSCOPY      Comment:  Esophagogastroduodenoscopy with biopsy, Colonoscopy,                diagnostic. no findings except for diverticula and check                in 10 years  No date: HYSTERECTOMY (CERVIX STATUS UNKNOWN)      Comment:  TAH-BSO. no  cancer  No date: JOINT REPLACEMENT; Left      Comment:  left total knee replacement  No date: LIPOMA RESECTION      Comment:  right side below right breast  1995: MASTECTOMY, RADICAL  04/12/2015: UPPER GASTROINTESTINAL ENDOSCOPY      Comment:  and colonoscopy. hiatal hernia and gastritis    No problem with anesthesia    Past Medical History:  No date: Anemia  No date: Arthritis  1995: Cancer ([place])      Comment:  hx of breast cancer--no chemo or radiation needed  No date: GERD (gastroesophageal reflux disease)  2001: Hx of blood clots      Comment:  after gallbladder surgery--blood clot in arm where IV                had been. 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   HENT:  Positive for voice change. Negative for sore throat and trouble swallowing.         She has full dentures     Some voice change related to her prior neck surgery. Not new    Respiratory:  Negative for cough, chest tightness and shortness of breath.    Cardiovascular:  Negative for chest pain and palpitations.        She has swelling in legs not unusual. She uses diuretic for same    Gastrointestinal:  Negative for abdominal distention, abdominal pain, blood in stool, constipation, diarrhea, nausea and vomiting.        No hx of hepatitis no gerd   Occ pbs to swallow  bread or chicken for 10 years    Genitourinary:  Negative for difficulty urinating, dysuria and hematuria.   Skin:  Negative for rash.   Neurological:  Negative for dizziness, seizures, syncope and headaches.        Headaches for while. 2 years comes and goes.   Top of the head    Hematological:  Negative for adenopathy. Does not bruise/bleed easily.        Hx of clot from I.V. no treatment      Objective:   Physical Exam  Constitutional:       General: She is not in acute distress.     Appearance: Normal appearance. She is well-developed. She is not ill-appearing or diaphoretic.  HENT:      Head: Normocephalic.      Right Ear: Tympanic membrane and ear canal normal.      Left Ear: Tympanic membrane and ear canal normal.      Nose: Nose normal.      Comments: Aging bruise about the nose no swelling no deformity      Mouth/Throat:      Lips: Pink.      Mouth: Mucous membranes are moist. No oral lesions.      Pharynx: Oropharynx is clear. Uvula midline.   Neck:      Thyroid: No thyroid mass or thyromegaly.   Cardiovascular:      Rate and Rhythm:  Normal rate and regular rhythm.      Heart sounds: Normal heart sounds. No murmur heard.    No friction rub. No gallop.      Comments:     Pulmonary:      Effort: Pulmonary effort is normal. No tachypnea, accessory muscle usage or respiratory distress.      Breath sounds: Normal breath sounds. No decreased breath sounds, wheezing, rhonchi or rales.   Abdominal:      General: Bowel sounds are normal. There is no distension or abdominal bruit.      Palpations: Abdomen is soft. There is no hepatomegaly, splenomegaly, mass or pulsatile mass.      Tenderness: There is no abdominal tenderness. There is no guarding.   Musculoskeletal:      Cervical back: Neck supple.   Lymphadenopathy:      Head:      Right side of head: No submental, submandibular, preauricular or posterior auricular adenopathy.      Left side of head: No submental, submandibular, preauricular or posterior auricular adenopathy.      Cervical: No cervical adenopathy.      Upper Body:      Right upper body: No supraclavicular adenopathy.      Left upper body: No supraclavicular adenopathy.   Skin:     General: Skin is warm and dry.      Coloration: Skin is not pale.      Nails: There is no clubbing.   Neurological:      Mental Status: She is alert.       Assessment:       Diagnosis Orders   1. Preop examination  EKG 12 lead    CBC with Auto Differential    Protime-INR    APTT    Comprehensive Metabolic Panel      2. Breast implant rupture, sequela        3. Essential hypertension, benign  EKG 12 lead    CBC with Auto Differential    Comprehensive Metabolic Panel      4. Acquired hypothyroidism        5. HX: breast cancer  CBC with Auto Differential    Comprehensive Metabolic Panel          Here with daughter Earnest Bailey and discussed hx and patient   She see dr Tawanna Solo now for the rheumatoid arthritis   Gabapenitn for the nerve pain legs is doing well   Iv related clot in arm in past and did not require treatment    Re her recent fall  Discussed with her and she  did not want to pursue PT to assist    Advised medicare may limit coverage of the preop labs wanted by surgeon        Interval Hx:  Recent  visit with dr Dorette Grate for her renal follow up. 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

## 2021-10-17 NOTE — Progress Notes (Signed)
Medicare Annual Wellness Visit    Jodi Walls is here for Medicare AWV    Assessment & Plan   Initial Medicare annual wellness visit  Recommendations for Preventive Services Due: see orders and patient instructions/AVS.  Recommended screening schedule for the next 5-10 years is provided to the patient in written form: see Patient Instructions/AVS.     No follow-ups on file.     Subjective   The following acute and/or chronic problems were also addressed today:  Patient was accompanied with daughter Earnest Bailey.   Advised patient on the need of Shingles vaccine, pt agreed.   Pt recently had a fall and injured her nose. Patient was seen in the ER.   Referred to care manager as patient is in need of financial assistance for medication.       Patient's complete Health Risk Assessment and screening values have been reviewed and are found in Flowsheets. The following problems were reviewed today and where indicated follow up appointments were made and/or referrals ordered.    Positive Risk Factor Screenings with Interventions:    Fall Risk:  Do you feel unsteady or are you worried about falling? : no  2 or more falls in past year?: (!) yes (pt will have family install more railing going into home from garage)  Fall with injury in past year?: no     Interventions:    Patient declines any further evaluation or treatment             Opioid Risk: (Low risk score <55) Opioid risk score: 21    Patient is low risk for opioid use disorder or overdose.  Last PDMP [place] as Reviewed:  Review User Review Instant Review Result              Last Controlled Substance Monitoring Documentation      Flowsheet Row Telephone from 11/22/2016 in [facility]   Attestation The Prescription Monitoring Report for this patient was reviewed today. filed at 11/22/2016 1511   Periodic Controlled Substance Monitoring No signs of potential drug abuse or diversion identified. filed at 11/22/2016 1511                Weight and  Activity:  Physical Activity: Inactive    Days of Exercise per Week: 0 days    Minutes of Exercise per Session: 0 min     On average, how many days per week do you engage in moderate to strenuous exercise (like a brisk walk)?: 0 days  Have you lost any weight without trying in the past 3 months?: No  Body mass index is 33.98 kg/m. (!) Abnormal    Inactivity Interventions:  Patient declined any further interventions or treatment  Obesity Interventions:  Patient declines any further evaluation or treatment              ADL's:   Patient reports needing help with:  Select all that apply: (!) Walking/Balance (pt uses a cane)  Select all that apply: [employer], Actuary, Transportation (patient's daughters assists with needs)  Interventions:  See above    Advanced Directives:  Do you have a Living Will?: (!) No (provided pt with paperwork)    Intervention:  has NO advanced directive - information provided          Objective   Vitals:    10/17/21 1116   BP: 122/68   Site: Left Upper Arm   Position: Sitting   Cuff Size: Large Adult  Pulse: 58   Temp: 97.9 F (36.6 C)   SpO2: 98%   Weight: 174 lb (78.9 kg)   Height: 5' (1.524 m)      Body mass index is 33.98 kg/m.            Allergies   Allergen Reactions    Pcn [Penicillins] Swelling     Face and hands swelled    Triamterene      Severe stomach pain,SOB,severe cramps throughout her body    Ace Inhibitors Other (See Comments)     cough    Atenolol Other (See Comments)     Bradycardia (HR 40's)    Atorvastatin Other (See Comments)     Muscle aches    Zithromax [Azithromycin] Swelling    Codeine Nausea And Vomiting     Prior to Visit Medications    Medication Sig Taking? Authorizing Provider   methocarbamol (ROBAXIN) 750 MG tablet Take 1 tablet by mouth 3 times daily Yes Historical Provider, MD   levothyroxine (SYNTHROID) 100 MCG tablet TAKE 1 TABLET BY MOUTH DAILY Yes Barron Alvine, MD   gabapentin (NEURONTIN) 300 MG capsule TAKE 2 CAPSULES BY MOUTH DAILY WITH SUPPER  Yes Oletta Lamas, MD   vitamin D (CHOLECALCIFEROL) 25 MCG (1000 UT) TABS tablet Take 1 tablet by mouth daily Yes Historical Provider, MD   predniSONE (DELTASONE) 5 MG tablet TAKE 1 TABLET BY MOUTH DAILY  Patient taking differently: Take 1 tablet by mouth every other day Yes Oletta Lamas, MD   DULoxetine (CYMBALTA) 60 MG extended release capsule TAKE 1 CAPSULE BY MOUTH DAILY Yes Oletta Lamas, MD   HYDROcodone-acetaminophen (NORCO) 5-325 MG per tablet TAKE 1 TABLET BY MOUTH TWICE DAILY FOR 15 DAYS Yes Historical Provider, MD   diclofenac sodium (VOLTAREN) 1 % GEL APPLY 4 GRAMS TO LOWER EXTREMITY JOINTS AND 2 GRAMS TO UPPER EXTREMITY JOINTS FOUR TIMES DAILY AS NEEDED Yes Oletta Lamas, MD   furosemide (LASIX) 40 MG tablet Take 1 tablet by mouth daily Yes Barron Alvine, MD   potassium chloride (KLOR-CON) 10 MEQ extended release tablet Take 1 tablet by mouth 2 times daily Yes Historical Provider, MD   melatonin 3 MG TABS tablet Take 1 tablet by mouth nightly Yes Historical Provider, MD   aspirin 81 MG tablet Take 1 tablet by mouth daily  Historical Provider, MD       CareTeam (Including outside providers/suppliers regularly involved in providing care):   Patient Care Team:  Barron Alvine, MD as PCP - General  Richard Miu Roland Earl., MD as PCP - Hematology/Oncology (Hematology and Oncology)  Barron Alvine, MD as PCP - Empaneled Provider     Reviewed and updated this visit:  Allergies  Meds       I, Stefano Gaul, LPN, 9/56/2130, performed the documented evaluation under the direct supervision of the attending physician.  This encounter was performed under my, Barron Alvine, MD's, direct supervision, 10/17/2021.

## 2021-10-18 NOTE — Care Coordination-Inpatient (Signed)
cc outreach 1 pcp/lpn referral. Attempted to reach patient and [facility] by phone. No answer. Left brief message with name, contact number and asked for return call back.  Waiting for patient response at this time. Will update notes when callback is received. Will follow up at another date and time.

## 2021-10-19 NOTE — Care Coordination-Inpatient (Signed)
cc outreach 2 pcp/lpn referral. Attempted to reach patient and [facility] by phone. No answer. Left brief message with name, contact number and asked for return call back.  Waiting for patient response at this time. Will update notes when callback is received. Will follow up at another date and time next week.

## 2021-10-23 NOTE — Progress Notes (Signed)
10/23/2021 1518 pm: abnormal lab results (from epic 6/20) bun/gfr routed to DR [facility] OFFICE/TS

## 2021-10-23 NOTE — Progress Notes (Addendum)
[facility] PRE-SURGICAL TESTING INSTRUCTIONS                      PRIOR TO PROCEDURE DATE:    1. PLEASE FOLLOW ANY INSTRUCTIONS GIVEN TO YOU PER YOUR SURGEON.      2. Arrange for someone to drive you home and be with you for the first 24 hours after discharge for your safety after your procedure for which you received sedation. Ensure it is someone we can share information with regarding your discharge.     NOTE: At this time ONLY 2 ADULTS may accompany you. NO CHILDREN UNDER AGE OF 16.    One person ENCOURAGED to stay at hospital entire time if outpatient surgery      3. You must contact your surgeon for instructions IF:  You are taking any blood thinners, aspirin, anti-inflammatory or vitamins.   Contact your ordering physician/surgeon for medication instructions as soon as possible, especially if taking blood thinners, aspirin, heart, or diabetic medication. STOP SUPPLEMENTS/VITAMINS/NON-STEROIDAL ANTI-INFLAMMATORY MEDICATION 7 DAYS PRIOR TO PROCEDURE.  There is a change in your physical condition such as a cold, fever, rash, cuts, sores, or any other infection, especially near your surgical site.    4. Do not drink alcohol the day before or day of your procedure.  Do not use any recreational marijuana at least 24 hours or street drugs (heroin, cocaine) at minimum 5 days prior to your procedure.     5. A Pre-Surgical History and Physical MUST be completed WITHIN 30 DAYS OR LESS prior to your procedure.by your Physician or an Urgent Care        THE DAY OF YOUR PROCEDURE:  1.  Follow instructions for ARRIVAL TIME as DIRECTED BY YOUR SURGEON.     2. Enter the MAIN entrance from [address] and follow the signs to the free Parking Garage or [employer] (offered free of charge 7 am-5pm).      3. Enter the Main Entrance of the hospital (do not enter from the lower level of the parking garage). Upon entrance, check in with the receptionist at the surgical information desk on your LEFT.   Bring your  insurance card and photo ID to register      4. DO NOT EAT ANYTHING 8 hours prior to arrival for surgery.  You may have up to 8 ounces of water 4 hours prior to your arrival for surgery.   5. MEDICATIONS:  Take the following medications with a SMALL sip of water: SYNTHROID MAY TAKE CYMBALTA AND ROXICODONE  6. Do not swallow additional water when brushing teeth. No gum, candy, mints, or ice chips. Refrain from smoking or at least decrease the amount on day of surgery.    7. Morning of surgery:   Take a shower with an antibacterial soap (i.e., Safeguard or Dial) OR your physician may have instructed you to use Hibiclens.  Dress in loose, comfortable clothing appropriate for redressing after your procedure.   Do not wear jewelry (including body piercings), make-up (especially NO eye make-up), fingernail polish (NO toenail polish if foot/leg surgery), lotion, powders, or metal hairclips.   Do not shave or wax for 72 hours prior to procedure near your operative site. Shaving with a razor can irritate your skin and make it easier to develop an infection. On the day of your procedure, any hair that needs to be removed near the surgical site will be 'clipped' by a healthcare worker using a special clipper designed  to avoid skin irritation.    8. Dentures, glasses, or contacts will need to be removed before your procedure. Bring cases for your glasses, contacts, dentures, or hearing aids to protect them while you are in surgery.      9. If you use a CPAP, please bring it with you on the day of your procedure.    10. If you use oxygen at home, please bring your oxygen tank with you to hospital..     11. We recommend that valuable personal belongings such as cash, cell phones, e-tablets, or jewelry, be left at home during your stay. The hospital will not be responsible for valuables that are not secured in the hospital safe. However, if your insurance requires a co-pay, you may want to bring a method of payment, i.e., Check or  credit card, if you wish to pay your co-pay the day of surgery.      12. If you are to stay overnight, you may bring a bag with personal items. Please have any large items you may need brought in by your family after your arrival to your hospital room.    11. If you have a Living Will or Durable Power of Attorney, please bring a copy on the day of your procedure.     How we keep you safe and work to prevent surgical site infections:   1. Health care workers should always check your ID bracelet to verify your name and birth date. You will be asked many times to state your name, date of birth, and allergies.    2. Health care workers should always clean their hands with soap or alcohol gel before providing care to you. It is okay to ask anyone if they cleaned their hands before they touch you.    3. You will be actively involved in verifying the type of procedure you are having and ensuring the correct surgical site. This will be confirmed multiple times prior to your procedure. Do NOT mark your surgery site UNLESS instructed to by your surgeon.     4. When you are in the operating room, your surgical site will be cleansed with a special soap, and in most cases, you will be given an antibiotic before the surgery begins.      What to expect AFTER your procedure?  1. Immediately following your procedure, your will be taken to the PACU for the first phase of your recovery.  Your nurse will help you recover from any potential side effects of anesthesia, such as extreme drowsiness, changes in your vital signs or breathing patterns. Nausea, headache, muscle aches, or sore throat may also occur after anesthesia.  Your nurse will help you manage these potential side effects.    2. For comfort and safety, arrange to have someone at home with you for the first 24 hours after discharge.    3. You and your family will be given written instructions about your diet, activity, dressing care, medications, and return visits.     4.  Once at home, should issues with nausea, pain, or bleeding occur, or should you notice any signs of infection, you should call your surgeon.    5. Always clean your hands before and after caring for your wound. Do not let your family touch your surgery site without cleaning their hands.     6. Narcotic pain medications can cause significant constipation.  You may want to add a stool softener to your postoperative medication schedule or speak  to your surgeon on how best to manage this SIDE EFFECT.    SPECIAL INSTRUCTIONS : NONE    Thank you for allowing [name] to care for you.  We strive to exceed your expectations in the delivery of care and service provided to you and your family.     If you need to contact the [facility] staff for any reason, please call us at [phone]    Instructions reviewed with patient during preadmission testing phone interview.  Amador Cunas, RN.10/23/2021 .3:57 PM      ADDITIONAL EDUCATIONAL INFORMATION REVIEWED PER PHONE WITH YOU AND/OR YOUR FAMILY:  No Hibiclens Bathing Instructions   Yes Antibacterial Soap REVIEWED INSTRUCTIONS FOR PROPER USE NIGHT BEFORE AND DOS

## 2021-10-23 NOTE — Progress Notes (Addendum)
PRE-OP INSTRUCTIONS FOR SURGICAL PATIENTS          Our Pre-admission Testing Nurses tried and were unable to reach you today.  Please read the attached instructions. THE MAILBOX WAS FULL AND WAS UNABLE TO LEAVE A MESSAGE.  EMAILED INSTRUCTIONS.      Follow all instructions provided to you from your surgeon's office, including your ARRIVAL TIME.   Arrange for someone to drive you home and be with you for the first 24 hours after discharge.     NOTE: at this time ONLY 2 ADULTS may accompany you. NO CHILDREN UNDER THE AGE OF 16.    One person encouraged to stay at hospital entire time if outpatient surgery    Enter the MAIN entrance located on [address] and report to the surgical desk on the LEFT side of the lobby. Please park in the parking garage or there is free [employer] available after 7am for your use.    Bring your insurance card & photo ID with you to register.  Bring your medication list with you with dose and frequency listed (including over the counter medications)  Contact your ordering physician/surgeon for medication instructions as soon as possible, especially if taking blood thinners, aspirin, heart, or diabetic medication. STOP VITAMINS/SUPPLEMENTS/NON-STEROIDAL ANTI-INFLAMMATORY MEDICATIONS 7 DAYS PRIOR TO PROCEDURE. IF Y OU ARE TAKING NEURONTIN, CYMBALTA, SYNTHROID IN THE MORNING YOU MAKE TAKE DAY OF PROCEDURE.  CHECK WITH DR Tamala Julian CARDIOLOGIST OF WHEN/IF APSIRIN SHOULD BE STOPPED/HELD PRIOR TO PROCEDUREWHEN TO  Pre-Surgical History and Physical MUST be completed WITHIN 30 DAYS OR LESS prior to your procedure by your Physician or an Urgent Care. OFFICE VISIT WITH CARDIOLOGIST DR Tamala Julian AS DIRECTED BY DR Barron Alvine.  DO NOT EAT ANYTHING 8 hours prior to arrival for surgery.  You may have sips of WATER ONLY (up to 8 ounces) 4 hours prior to your arrival for surgery. Then nothing further 4 hours prior to arriving at hospital.   No gum, candy, mints, or ice chips day of procedure.   Please  refrain from drinking alcohol the day before or day of your procedure.   Do not use any recreational marijuana at least 24 hours or street drugs (heroin, cocaine) at minimum 5 days prior to your procedure.   Please do not smoke the day of your procedure.    Dress in loose, comfortable clothing appropriate for redressing after your procedure.   Do not wear jewelry (including body piercings), make-up, fingernail polish, lotion, powders, or metal hairclips.   Contacts, glasses, dentures, and hearing aids will need to be removed prior to surgery.  Bring your case(s) to protect them while you are in surgery.    If you use a CPAP, please bring it with you on the day of your procedure.  If you use oxygen at home, please bring your oxygen tank with you to hospital.  Do not shave or wax for 72 hours prior to procedure near your operative site.  Leave all other valuables at home.  IF there is a change in your physical condition for some reason, such as: a cold, fever, rash, cuts, sores, or any other infection, especially near your surgical site, contact your surgeon's office immediately.

## 2021-10-23 NOTE — Progress Notes (Addendum)
10/23/2021 1530 PM MAILBOX FULL-UNABLE TO LEAVE MESSAGE. SPOKE TO DAUGHTER HOLLY WHO RECOMMEND TO CALL MOTHER BACK TOMORROW. PT SEEING CARDIOLOGIST DR Tamala Julian 10/24/2021. EMAILED INSTRUCTIONS TO PT/TS    10/23/2021 1531 PM: H&P DONE BY PERRY WONG IN Epic 10/17/2021 RECOMMENDED CARDIOLOGIST DR Doristine Johns BE NOTIFIED &OK W/SURGERY. LABS/EKG IN Epic 10/17/2021/TS6/26/2023 1600 PM: TI COMPLETED/TS

## 2021-10-23 NOTE — Progress Notes (Signed)
Place patient label inside box (if no patient label, complete below)  Name:  DOB:  MR#:     PROCEDURAL INFORMED CONSENT FOR OPERATION / PROCEDURE  I (we), Jodi Walls  authorize Jodi Chapman, MD and/or such assistants as may be selected by him/her, to perform the following operation/procedure(s): EXCHANGE OF PERMANENT IMPLANT RIGHT BREAST ; RIGHT BREAST CAPSULECTOMY        Note: If unable to obtain consent prior to an emergent procedure, document the emergent reason in the medical record.       This procedure has been explained to my (our) satisfaction and included in the explanation was:  The intended benefit, nature, and extent of the procedure to be performed;  The significant risks involved and the probability of success;  Alternative procedures and methods of treatment;  The dangers and probable consequences of such alternatives (including no procedure or treatment);  The expected consequences of the procedure on my future health;  Whether other qualified individuals would be performing important surgical tasks and/or whether vendor representatives would be present to advise or support the procedure.    I (we) understand that there are other risks of infection and other serious complications in the pre-operative/procedural and postoperative/procedural stages of my (our) care.     I (we) have asked all of the questions which I (we) thought were important in deciding whether or not to undergo treatment or diagnosis. These questions have been answered to my (our) satisfaction.    I (we) understand that no assurance can be given that the procedure will be a success, and no guarantee or warranty of success has been given to me (Jodi Walls).    It has been explained to me (Jodi Walls) that during the course of the operation/procedure, unforeseen conditions may be revealed that necessitate extension of the original procedure(s) or different procedure(s) than those set forth in Paragraph 1. I (we) authorize and request that the  above-named physician, his/her assistants or his/her designees, perform procedures as necessary and desirable if deemed to be in my (our) best interest.     Revised 11/30/2019                                                                          Page 1 of 2           I acknowledge that health care personnel may be observing this procedure for the purpose of medical education or other specified purposes as may be necessary as requested and/or approved by my (our) physician.    I (we) consent to the disposal by the hospital Pathologist of the removed tissue, parts or organs in accordance with hospital policy.    I do ____ do not ____ consent to the use of a local infiltration pain blocking agent that will be used by my provider/surgical provider to help alleviate pain during my procedure.    I do ____ do not ____ consent to an emergent blood transfusion in the case of a life-threatening situation that requires blood components to be administered. This consent is valid for 24 hours from the beginning of the procedure.     This patient does ____ or does not ____ currently have a DNR status/order. If  DNR order is in place, obtain "Addendum to the Surgical Consent for ALL Patients with a DNR Order" to address peri-operative status for limited intervention or DNR suspension.     I have read and fully understand the above Consent for Operation/Procedure and that all blanks were completed before I signed the consent.   _____________________________       _____________________      ____/____am/pm  Signature of Patient or legal representative      Printed Name / Relationship            Date / Time   ____________________________       _____________________      ____/____am/pm  Witness to Signature                                    Printed Name                    Date / Time    If patient is unable to sign or is a minor, complete the following)  Patient is a minor, ____ years of age, or unable to sign because:    ______________________________________________________________________________________________    If a phone consent is obtained, consent will be documented by using two health care professionals, each affirming that the consenting party has no questions and gives consent for the procedure discussed with the physician/provider.   _____________________          ____________________       _____/_____am/pm   2nd witness to phone consent        Printed name           Date / Time    Informed Consent:  I have provided the explanation described above in section 1 to the patient and/or legal representative. I have provided the patient and/or legal representative with an opportunity to ask any questions about the proposed operation/procedure.   ___________________________          ____________________         ____/____am/pm  Provider / Proceduralist                            Printed name            Date / Time  Revised 11/30/2019                                                                      Page 2 of 2

## 2021-10-23 NOTE — Care Coordination-Inpatient (Signed)
cc final call utr letter. No additional outreaches. Will remain available if return call back is received.

## 2021-10-24 NOTE — Other (Unsigned)
Subjective  Jodi Walls  Sep 14, 1940  Chief Complaint  Patient presents with   Cardiac Clearance    Breast surgery on 10/26/21 @ [facility]    I had the pleasure seeing Jacqlyn Larsen in a follow-up.  The patient is an 81 year old  with a history of a left bundle-branch block with a stress nuclear study 2018  showing no reversible defects with an EF of 56, history of a coronary artery  calcium score of less than 100 and a nonischemic cardiomyopathy with an EF  around 46% based on an echo done 02/2020.  The patient has had a prior COVID  infection and since then has had chronic dyspnea.  She is here for preop  evaluation prior to breast surgery.  She notes that she is going to be having   a  breast implant placed to her right chest.  She notes that she is had baseline  energy level with baseline dyspnea exertion.  She did have a fall recently and  had trauma to her face and her lower extremities.  She did have x-rays done  which did not show sign of fracture.  She is had no chest pain and denies any  palpitations.  She is had some mild increased lower extremity swelling to her  lower extremities since her fall.  She is had no melena hematochezia.  She  denies any syncope or dizziness.  She is had baseline energy level with   baseline  dyspnea exertion.    Patient's Medications  Current Medications   ASPIRIN 81 MG TABLET, DELAYED RELEASE (E.C.)    Take 1 Tablet by mouth daily.      Order Dose: 1 Tablet   CHOLECALCIFEROL (VITAMIN D-3) 10 MCG (400 UNIT) TABLET    Take 400 Units by  mouth daily.      Order Dose: 400 Units   CYANOCOBALAMIN 100 MCG TABLET    Take 100 mcg by mouth daily.      Order Dose: 100 mcg   DICYCLOMINE (BENTYL) 20 MG TABLET    Take 20 mg by mouth 3 times a day before  meals.      Order Dose: 20 mg   DULOXETINE (CYMBALTA) 60 MG CAPSULE, DELAYED RELEASE(E.C.)    Take 60 mg by  mouth daily.      Order Dose: 60 mg   FERROUS SULFATE PO    Take by mouth.      Order Dose: --   FUROSEMIDE (LASIX) 40 MG  TABLET    Take 40 mg by mouth daily.      Order Dose: 40 mg   GABAPENTIN (NEURONTIN) 300 MG CAPSULE    Take 600 mg by mouth 2 times daily.      Order Dose: 600 mg   HYDROXYCHLOROQUINE (PLAQUENIL) 200 MG TABLET    Take 200 mg by mouth daily.      Order Dose: 200 mg   LEFLUNOMIDE (ARAVA) 20 MG TABLET    Take 20 mg by mouth daily.      Order Dose: 20 mg   LEVOTHYROXINE (SYNTHROID) 75 MCG TABLET    100 mcg. daily      Order Dose: 100 mcg   MELATONIN 3 MG TABLET    Take 3 mg by mouth nightly at bedtime.      Order Dose: 3 mg   OXYCODONE 5 MG PO IMMEDIATE RELEASE TABLET    Take 5 mg by mouth.      Order Dose: 5 mg  POTASSIUM CHLORIDE (KDUR) 10 MEQ PO TABLET    Take 10 mEq by mouth 2 times  daily. daily      Order Dose: 10 mEq   PREDNISONE (DELTASONE) 5 MG TABLET    Take 5 mg by mouth daily.      Order Dose: 5 mg                Review of Systems        Vitals:   10/24/21 1020 10/24/21 1038  BP: 100/62 114/62  Pulse: 75  Weight: 172 lb (78 kg)  Height: 5' (1.524 m)    Body mass index is 33.59 kg/m.    Physical Exam  Constitutional:     Appearance: Normal appearance.  Neck:     Vascular: No carotid bruit or JVD.  Cardiovascular:     Rate and Rhythm: Normal rate and regular rhythm. No extrasystoles are  present.     Pulses: Normal pulses.     Heart sounds: Normal heart sounds, S1 normal and S2 normal. No murmur   heard.     No gallop.  Pulmonary:     Effort: Pulmonary effort is normal.     Breath sounds: Normal breath sounds. No wheezing or rales.  Abdominal:     General: Bowel sounds are normal.     Tenderness: There is no abdominal tenderness.  Musculoskeletal:     General: Swelling present.     Comments: Trace swelling, extensive bruising to right lower extremity  Skin:     General: Skin is warm and dry.     Nails: There is no clubbing.  Neurological:     Mental Status: She is alert and oriented to person, place, and time.                IMPRESSION:        ICD-10-CM  1. Pre-op evaluation  Z01.818 ECG    2. LBBB (left  bundle branch block)  I44.7 ECG    3. Secondary hypertension  I15.9 ECG    4. Agatston coronary artery calcium score less than 100  R93.1    5. Mixed hyperlipidemia  E78.2            PLAN AND RECOMMENDATION:    1. Preop evaluation-the patient is at low risk for a low risk procedure.  Her  EKG per my personal interpretation shows baseline changes with a left  bundle-branch block.  Patient can proceed to surgery without further cardiac  testing.  2. Left bundle branch block-the patient has a history of a left bundle-branch  block without evidence of ischemia on prior nuclear imaging.  She does have  mildly depressed LV function based on her most recent echo.  The patient is  clinically stable without any new cardiovascular symptoms.  3. Agatston coronary artery calcium score less than 100-the patient is not  having symptoms to suggest cardiac ischemia.  The patient is clinically stable  on aspirin.  4. Nonischemic cardiomyopathy-the patient has mildly depressed LV function   based  on her most recent echo which is probably related to her bundle-branch block.  The patient has low blood pressure and intolerant of congestive heart failure  meds and is not having symptoms to suggest volume overload.  She is clinically  stable on low-dose Lasix.

## 2021-10-26 ENCOUNTER — Inpatient Hospital Stay: Payer: Medicare (Managed Care) | Attending: Surgery

## 2021-10-26 MED ORDER — PROPOFOL 200 MG/20ML IV EMUL
200 MG/20ML | INTRAVENOUS | Status: AC
Start: 2021-10-26 — End: ?

## 2021-10-26 MED ORDER — NORMAL SALINE FLUSH 0.9 % IV SOLN
0.9 % | Freq: Two times a day (BID) | INTRAVENOUS | Status: DC
Start: 2021-10-26 — End: 2021-10-26

## 2021-10-26 MED ORDER — ROCURONIUM BROMIDE 50 MG/5ML IV SOLN
50 MG/5ML | INTRAVENOUS | Status: AC
Start: 2021-10-26 — End: ?

## 2021-10-26 MED ORDER — ONDANSETRON HCL 4 MG/2ML IJ SOLN
4 MG/2ML | Freq: Once | INTRAMUSCULAR | Status: AC | PRN
Start: 2021-10-26 — End: 2021-10-26
  Administered 2021-10-26: 19:00:00 4 mg via INTRAVENOUS

## 2021-10-26 MED ORDER — PROCHLORPERAZINE EDISYLATE 10 MG/2ML IJ SOLN
10 MG/2ML | Freq: Once | INTRAMUSCULAR | Status: DC | PRN
Start: 2021-10-26 — End: 2021-10-26

## 2021-10-26 MED ORDER — SUCCINYLCHOLINE CHLORIDE 200 MG/10ML IV SOSY
200 MG/10ML | INTRAVENOUS | Status: AC
Start: 2021-10-26 — End: ?

## 2021-10-26 MED ORDER — HYDRALAZINE HCL 20 MG/ML IJ SOLN
20 MG/ML | INTRAMUSCULAR | Status: DC | PRN
Start: 2021-10-26 — End: 2021-10-26

## 2021-10-26 MED ORDER — SODIUM CHLORIDE 0.9 % IR SOLN
0.9 % | Status: DC | PRN
Start: 2021-10-26 — End: 2021-10-26
  Administered 2021-10-26: 17:00:00 1000

## 2021-10-26 MED ORDER — LACTATED RINGERS IV SOLN
INTRAVENOUS | Status: DC | PRN
Start: 2021-10-26 — End: 2021-10-26
  Administered 2021-10-26: 17:00:00 via INTRAVENOUS

## 2021-10-26 MED ORDER — FENTANYL CITRATE (PF) 100 MCG/2ML IJ SOLN
100 MCG/2ML | INTRAMUSCULAR | Status: AC
Start: 2021-10-26 — End: ?

## 2021-10-26 MED ORDER — ROCURONIUM BROMIDE 50 MG/5ML IV SOLN
50 MG/5ML | INTRAVENOUS | Status: DC | PRN
Start: 2021-10-26 — End: 2021-10-26
  Administered 2021-10-26: 17:00:00 50 via INTRAVENOUS

## 2021-10-26 MED ORDER — PROPOFOL 200 MG/20ML IV EMUL
200 MG/20ML | INTRAVENOUS | Status: DC | PRN
Start: 2021-10-26 — End: 2021-10-26
  Administered 2021-10-26: 17:00:00 50 via INTRAVENOUS

## 2021-10-26 MED ORDER — APREPITANT 40 MG PO CAPS
40 MG | Freq: Once | ORAL | Status: AC
Start: 2021-10-26 — End: 2021-10-26
  Administered 2021-10-26: 16:00:00 80 mg via ORAL

## 2021-10-26 MED ORDER — DEXAMETHASONE SODIUM PHOSPHATE 4 MG/ML IJ SOLN
4 MG/ML | INTRAMUSCULAR | Status: AC
Start: 2021-10-26 — End: ?

## 2021-10-26 MED ORDER — LIDOCAINE (CARDIAC) 100 MG/5ML IV SOLN (MIXTURES ONLY)
100 MG/5ML | INTRAVENOUS | Status: DC | PRN
Start: 2021-10-26 — End: 2021-10-26
  Administered 2021-10-26: 17:00:00 100 via INTRAVENOUS

## 2021-10-26 MED ORDER — CLINDAMYCIN PHOSPHATE IN D5W 600 MG/50ML IV SOLN
600 MG/50ML | Freq: Once | INTRAVENOUS | Status: AC
Start: 2021-10-26 — End: 2021-10-26
  Administered 2021-10-26: 17:00:00 600 mg via INTRAVENOUS

## 2021-10-26 MED ORDER — SODIUM CHLORIDE 0.9 % IV SOLN
0.9 % | INTRAVENOUS | Status: DC | PRN
Start: 2021-10-26 — End: 2021-10-26

## 2021-10-26 MED ORDER — TRANEXAMIC ACID 1000 MG/10ML IV SOLN
100010 MG/10ML | Freq: Once | INTRAVENOUS | Status: AC
Start: 2021-10-26 — End: 2021-10-26
  Administered 2021-10-26: 17:00:00 1000 mg via INTRAVENOUS

## 2021-10-26 MED ORDER — SODIUM CHLORIDE 0.9 % IR SOLN
0.9 % | Status: DC | PRN
Start: 2021-10-26 — End: 2021-10-26
  Administered 2021-10-26: 17:00:00

## 2021-10-26 MED ORDER — HYDROMORPHONE HCL PF 1 MG/ML IJ SOLN
1 MG/ML | INTRAMUSCULAR | Status: DC | PRN
Start: 2021-10-26 — End: 2021-10-26

## 2021-10-26 MED ORDER — DOCUSATE SODIUM 100 MG PO CAPS
100 MG | ORAL_CAPSULE | Freq: Two times a day (BID) | ORAL | 0 refills | Status: AC
Start: 2021-10-26 — End: 2021-10-31
  Filled 2021-10-26: qty 10, 5d supply, fill #0

## 2021-10-26 MED ORDER — ONDANSETRON HCL 4 MG/2ML IJ SOLN
4 MG/2ML | INTRAMUSCULAR | Status: DC | PRN
Start: 2021-10-26 — End: 2021-10-26
  Administered 2021-10-26: 17:00:00 4 via INTRAVENOUS

## 2021-10-26 MED ORDER — LACTATED RINGERS IV SOLN
INTRAVENOUS | Status: DC
Start: 2021-10-26 — End: 2021-10-26
  Administered 2021-10-26: 16:00:00 via INTRAVENOUS

## 2021-10-26 MED ORDER — ONDANSETRON HCL 4 MG/2ML IJ SOLN
4 MG/2ML | INTRAMUSCULAR | Status: AC
Start: 2021-10-26 — End: ?

## 2021-10-26 MED ORDER — CLINDAMYCIN HCL 300 MG PO CAPS
300 MG | ORAL_CAPSULE | Freq: Four times a day (QID) | ORAL | 0 refills | Status: AC
Start: 2021-10-26 — End: 2021-11-05
  Filled 2021-10-26: qty 40, 10d supply, fill #0

## 2021-10-26 MED ORDER — NORMAL SALINE FLUSH 0.9 % IV SOLN
0.9 % | INTRAVENOUS | Status: DC | PRN
Start: 2021-10-26 — End: 2021-10-26

## 2021-10-26 MED ORDER — SUGAMMADEX SODIUM 200 MG/2ML IV SOLN
200 MG/2ML | INTRAVENOUS | Status: DC | PRN
Start: 2021-10-26 — End: 2021-10-26
  Administered 2021-10-26: 18:00:00 200 via INTRAVENOUS

## 2021-10-26 MED ORDER — FENTANYL CITRATE (PF) 100 MCG/2ML IJ SOLN
100 MCG/2ML | INTRAMUSCULAR | Status: DC | PRN
Start: 2021-10-26 — End: 2021-10-26
  Administered 2021-10-26: 17:00:00 100 via INTRAVENOUS

## 2021-10-26 MED ORDER — OXYCODONE HCL 5 MG PO TABS
5 MG | Freq: Once | ORAL | Status: DC | PRN
Start: 2021-10-26 — End: 2021-10-26

## 2021-10-26 MED ORDER — LIDOCAINE (CARDIAC) 100 MG/5ML IV SOLN (MIXTURES ONLY)
100 MG/5ML | INTRAVENOUS | Status: AC
Start: 2021-10-26 — End: ?

## 2021-10-26 MED ORDER — PHENYLEPHRINE HCL 10 MG/ML IV SOLN
10 MG/ML | INTRAVENOUS | Status: AC
Start: 2021-10-26 — End: ?

## 2021-10-26 MED FILL — CLINDAMYCIN PHOSPHATE IN D5W 600 MG/50ML IV SOLN: 600 MG/50ML | INTRAVENOUS | Qty: 50

## 2021-10-26 MED FILL — DEXAMETHASONE SODIUM PHOSPHATE 4 MG/ML IJ SOLN: 4 MG/ML | INTRAMUSCULAR | Qty: 5

## 2021-10-26 MED FILL — ONDANSETRON HCL 4 MG/2ML IJ SOLN: 4 MG/2ML | INTRAMUSCULAR | Qty: 2

## 2021-10-26 MED FILL — TRANEXAMIC ACID 1000 MG/10ML IV SOLN: 1000 MG/10ML | INTRAVENOUS | Qty: 10

## 2021-10-26 MED FILL — DIPRIVAN 200 MG/20ML IV EMUL: 200 MG/20ML | INTRAVENOUS | Qty: 20

## 2021-10-26 MED FILL — EMEND 40 MG PO CAPS: 40 MG | ORAL | Qty: 2

## 2021-10-26 MED FILL — LIDOCAINE HCL (CARDIAC) 100 MG/5ML IV SOSY: 100 MG/5ML | INTRAVENOUS | Qty: 5

## 2021-10-26 MED FILL — PHENYLEPHRINE HCL (PRESSORS) 10 MG/ML IV SOLN: 10 MG/ML | INTRAVENOUS | Qty: 1

## 2021-10-26 MED FILL — ROCURONIUM BROMIDE 50 MG/5ML IV SOLN: 50 MG/5ML | INTRAVENOUS | Qty: 5

## 2021-10-26 MED FILL — FENTANYL CITRATE (PF) 100 MCG/2ML IJ SOLN: 100 MCG/2ML | INTRAMUSCULAR | Qty: 2

## 2021-10-26 MED FILL — SUCCINYLCHOLINE CHLORIDE 200 MG/10ML IV SOSY: 200 MG/10ML | INTRAVENOUS | Qty: 10

## 2021-10-26 NOTE — Progress Notes (Signed)
Nausea improved but not gone. Ice chips given.

## 2021-10-26 NOTE — Anesthesia Post-Procedure Evaluation (Signed)
Department of Anesthesiology  Postprocedure Note    Patient: Jodi Walls  MRN: [id number]  Birthdate: [birth year]  Date of evaluation: 10/26/2021      Procedure Summary     Date: 10/26/21 Room / Location: [facility]    Anesthesia Start: 0814 Anesthesia Stop: 1345    Procedures:       EXCHANGE OF PERMANENT IMPLANT RIGHT BREAST (Right: Breast)      . (Right: Breast) Diagnosis:       Rupture of implant of right breast, initial encounter      (Rupture of implant of right breast, initial encounter [T85.43XA])    Surgeons: Micheline Chapman, MD Responsible Provider: Rochel Brome, MD    Anesthesia Type: general ASA Status: 3          Anesthesia Type: No value filed.    Aldrete Phase I: Aldrete Score: 10    Aldrete Phase II: Aldrete Score: 10      Anesthesia Post Evaluation    Patient location during evaluation: PACU  Patient participation: complete - patient participated  Level of consciousness: awake and alert  Airway patency: patent  Nausea & Vomiting: nausea and no vomiting (treated with zofran)  Cardiovascular status: blood pressure returned to baseline  Respiratory status: acceptable  Hydration status: euvolemic

## 2021-10-26 NOTE — H&P (Signed)
Update History & Physical    The patient's History and Physical of Aug 30, 2021 was reviewed with the patient and I examined the patient. There was no change. The surgical site was confirmed by the patient and me. Patient reports that she fell about 2 weeks ago, went to the ED for testing which were negative. She is recovering from bruising over her RLE. Denies any pain. Reports she has not taken aspirin in over a week.       Plan: The risks, benefits, expected outcome, and alternative to the recommended procedure have been discussed with the patient. Patient understands and wants to proceed with the procedure.     Electronically signed by Davene Costain, DO on 10/26/2021 at 11:43 AM

## 2021-10-26 NOTE — Progress Notes (Signed)
Coughing - lungs clear. Reports nausea. Zofran given. Promised ice chips when nausea improved.  "Do NOT use ice" written in red on front of discharge folder.

## 2021-10-26 NOTE — Progress Notes (Signed)
Patient arrived to PACU post [place] - Right with Dr. Claudia Desanctis. VSS on arrival with non-re-breather in place. CRNA gave PACU RN report at bedside stating no complications during procedure. Surgical bra in place. Dressing to surgical site clean, dry and intact. Patient shows no signs of pain at this time. Will continue to monitor.

## 2021-10-26 NOTE — Discharge Instructions (Addendum)
[facility]    Leland Her, MD  [phone] [address] ([place] [address]  [phone]    Post-op Instructions  ___________________________________________    Implant Based Breast Reconstruction     The following instructions will help you know what to expect in the days following your surgery. Do not, however, hesitate to call if you have questions or concerns.    Activities    - Sleep in a flexed position with your head and shoulders elevated. Keep pillows under your knees for the first few days. You can resume your normal sleeping position when comfortable.   - Driving is prohibited for 1 to 2 weeks. Do not drive while taking pain medications.   - Avoid heavy lifting (no more than 5 pounds) and vigorous use of your arms for the first 3 weeks.   - Do not engage in sports, aerobics, or vacuuming.   - Start arm raising exercises gently within 1 week of surgery. This will be discussed at your follow-up appointment.         - Avoid heavy lifting >5 lb, bending, pushing, pulling, or straining   - Smoking (or ANY nicotine containing product) is prohibited for a minimum of 6 weeks as it interferes with healing and can lead to significant - and avoidable - complications  - You may need to use fiberfill or other padding on the opposite breast to maintain symmetry in clothing. Shoulder pads sometimes work nicely too.     Diet  - Resume your preoperative diet as tolerated.     Pain Control/Medications    - You may restart your Aspirin in 48 hours after surgery (10/28/2021).  - You will receive a prescription for pain medication that can be taken as directed if needed for pain control. The pain medication may cause constipation and does impair your ability to drive or make important decisions.          - You may also be given a muscle relaxant that can help with muscle spasms. Do                  not take the pain medication and muscle relaxant at the same time        - There is  absolutely NO driving while on pain medication or Valium or                        Flexoril .        - Additionally do NOT take any of the following products:    Aspirin/low-dose aspirin    Ibuprofen (Advil, Motrin    Naprosyn or Naproxen (Aleve)    Vitamin E (even small amounts in multiple vitamins)    Herbals or homeopathic medicines or green tea    Growth hormone    Diet pills (Meridia, Metabolife, etc)         - You will also be prescribed an antibiotic, make sure to complete the prescribed                    course. You can also take a probiotic yogurt (Activia) to help with your bowel                 function while on these medications.         - Take your medications as prescribed.    Antibiotic to prevent infection:    Pain  medication, if needed:    Valium to prevent muscle spasm:    Other: Lactobacillus (Culturelle) probiotic while taking antibiotics.  You can    obtain this over the counter or within any yogurt that specifically has the active    ingredient: L. acidophilus (Lactobacillus acidophilus).    Wound Care  - You will have some swelling or bruising of the breasts and upper abdomen. This is normal and will lessen over the next 1 to 3 weeks.   - You may notice a change in sensation or numbness of the breast skin. This is common after surgery and should improve gradually over time.   - You can resume daily showers in 24 hours, but avoid very hot water. You can allow soapy water to run over your breast and then after showering, pat the breast dry. Do not scrub or wipe the reconstructed breast until instructed (usually 6-8 weeks).  - Do NOT submerge your incisions (e.g. no swimming or baths submerging breasts until instructed)  - A bra should be worn at all times day and night after surgery. The bra should not be tight, have under wires, or have strong elastic. You will receive a surgical bra from the hospital stay as well as an additional clean bra to take home.  Wear these bras for the first  week until your follow-up appointment.  - If you have an ACE wrap, keep this in place for the first 24 hours and then switch to a surgical bra, which will be worn at all times until follow-up.  - If you have had fat grafting, you will have a binder in place to decrease bruising.  You may also have dressings that are stuck to your abdomen and these are in place to provide compression to also decrease bruising.  - Abdominal bruising and swelling is normal and this will subside in 3-6 months.     Follow-up  Call your doctor's office at the first sign of:  Excessive worsening pain associated with pressure of the breast.   Enlargement of the breast area (eggplant color or worsening bruising).   Bleeding at the incision.   Redness, drainage or odor from the incisions.   Fever or chills.   Shortness of breath.     Do not hesitate to call if you have any questions or concerns.      [place]    There are potential side effects of anesthesia or sedation you may experience for the first 24 hours.  These side effects include:    Confusion or Memory loss, Dizziness, or Delayed Reaction Times   '[x]'$ A responsible person should be with you for the next 24 hours.  Do not operate any vehicles (automobiles, bicycles, motorcycles) or power tools or machinery for 24 hours.  Do not sign any legal documents or make any legal decisions for 24 hours. Do not drink alcohol for 24 hours or while taking narcotic pain medication.      Nausea    '[x]'$ Start with light diet and progress to your normal diet as you feel like eating. However, if you experience nausea or repeated episodes of vomiting which persist beyond 12-24 hours, notify your physician.  Once nausea has passed, remember to keep drinking fluids.    Difficulty Passing Urine  '[x]'$ Drink extra amounts of fluid today.  Notify your physician if you have not urinated within 8 hours after your procedure or you feel uncomfortable.      Irritated  Throat  from a Breathing Tube  '[x]'$ Drink extra amounts of fluid today.  Lozenges may help.    Muscle Aches  '[x]'$ You may experience some generalized body aches as your muscles recover from medications used to relax them during surgery.  These will gradually subside.    MEDICATION INSTRUCTIONS:  '[x]'$ Prescription(S) x    2 sent with you.  Use as directed.  When taking pain medications, you may experience the side effect of dizziness or drowsiness.  Do not drink alcohol or drive when taking these medications.  '[]'$ Prescription(S) x          Called to Pharmacy Name and location:    '[x]'$ Give the list of your medications to your primary care physician on your next visit. Keep your med list updated and carry it with in case of emergencies.    '[]'$  Narcotic pain medications can cause the side effect of significant constipation.  You may want to add a stool softener to your postoperative medication schedule or speak to your surgeon on how best to manage this side effect.    NARCOTIC SAFETY:  Your pain medicine is only for you to take.  Safely store your medicines.  Store pills up high and out of reach of children and pets.  Ensure safety caps are snapped tightly  Keep track of how many pills you have left    Unused medication can be disposed of by taking them to a drop-off box or take-back program that is authorized by the [facility].  Access to a site near you can be found on the [employer] website ([email or web address].[email or web address].uy).    If you have a CPAP machine, it is very important that you use it daily during all periods of sleep and daytime rest during your recovery at home.  Surgery and Anesthesia place a significant amount of stress on your body.  Using your CPAP will help keep you safe and lessen the negative effects of that stress.    FOLLOW-UP RECOVERY CARE:  '[x]'$ Call the office at [phone] for follow-up appointment and problems    Watch for these possible complications, symptoms, or side effects of anesthesia.   Call physician if they or any other problems occur:  Signs of INFECTION   > Fever over 101     > Redness, swelling, hardness or warmth at the operative site   >Foul smelling or cloudy drainage at the operative site   Unrelieved PAIN  Unrelieved NAUSEA  Blood soaked dressing.  (Some oozing may be normal)  Inability to urinate      Numb, pale, blue, cold or tingling extremity      Physician:  Dr Claudia Desanctis    The above instructions were reviewed with patient/significant other.  The following additional patient specific information was reviewed with the patient/significant other:  '[x]'$ Procedure/physician specific instructions  '[]'$ Medication information sheet(S) including potential side effects  '[]'$ Dionne's egress test  '[]'$ Pain Ball management  '[]'$ FAQ Catheter associated blood stream infections  '[x]'$ FAQ Surgical Site Infections  '[]'$ Other-    I have read and understand the instructions given to me: ____________________________________________   (Patient/S.O. Signature)            Date/time 10/26/2021 2:04 PM         PACU:  [phone]   M-F 700 AM - 7 PM      SAME DAY SERVICES:  [phone] M-F 7AM-6PM        If you smoke STOP. We care about your health!

## 2021-10-26 NOTE — Progress Notes (Signed)
Reports nausea better.

## 2021-10-26 NOTE — Anesthesia Pre-Procedure Evaluation (Signed)
Department of Anesthesiology  Preprocedure Note       Name:  Jodi Walls   Age:  81 y.o.  DOB:  November 30, 1940                                          MRN:  [id number]         Date:  10/26/2021      Surgeon: Juliann Mule):  Micheline Chapman, MD    Procedure: Procedure(s):  EXCHANGE OF PERMANENT IMPLANT RIGHT BREAST ; RIGHT BREAST CAPSULECTOMY  .    Medications prior to admission:   Prior to Admission medications    Medication Sig Start Date End Date Taking? Authorizing Provider   oxyCODONE (ROXICODONE) 5 MG immediate release tablet Take 1 tablet by mouth in the morning and 1 tablet in the evening. 10/15/21   Historical Provider, MD   methocarbamol (ROBAXIN) 750 MG tablet Take 1 tablet by mouth 3 times daily 10/15/21   Historical Provider, MD   levothyroxine (SYNTHROID) 100 MCG tablet TAKE 1 TABLET BY MOUTH DAILY 10/16/21   Barron Alvine, MD   gabapentin (NEURONTIN) 300 MG capsule TAKE 2 CAPSULES BY MOUTH DAILY WITH SUPPER 09/14/21 12/15/21  Oletta Lamas, MD   vitamin D (CHOLECALCIFEROL) 25 MCG (1000 UT) TABS tablet Take 1 tablet by mouth daily    Historical Provider, MD   predniSONE (DELTASONE) 5 MG tablet TAKE 1 TABLET BY MOUTH DAILY  Patient taking differently: Take 1 tablet by mouth every other day 06/21/21   Oletta Lamas, MD   DULoxetine (CYMBALTA) 60 MG extended release capsule TAKE 1 CAPSULE BY MOUTH DAILY 05/17/21   Oletta Lamas, MD   diclofenac sodium (VOLTAREN) 1 % GEL APPLY 4 GRAMS TO LOWER EXTREMITY JOINTS AND 2 GRAMS TO UPPER EXTREMITY JOINTS FOUR TIMES DAILY AS NEEDED 12/07/19   Oletta Lamas, MD   furosemide (LASIX) 40 MG tablet Take 1 tablet by mouth daily 10/19/19   Barron Alvine, MD   potassium chloride (KLOR-CON) 10 MEQ extended release tablet Take 1 tablet by mouth 2 times daily 06/08/19   Historical Provider, MD   melatonin 3 MG TABS tablet Take 1 tablet by mouth nightly    Historical Provider, MD   aspirin 81 MG tablet Take 1 tablet by mouth daily    Historical Provider, MD       Current medications:    Current  Facility-Administered Medications   Medication Dose Route Frequency Provider Last Rate Last Admin   . clindamycin (CLEOCIN) 600 mg in dextrose 5 % 50 mL IVPB  600 mg IntraVENous Once Micheline Chapman, MD       . tranexamic acid (CYKLOKAPRON) 1,000 mg in sodium chloride 0.9 % 60 mL IVPB  1,000 mg IntraVENous Once Micheline Chapman, MD       . lactated ringers IV soln infusion   IntraVENous Continuous Rochel Brome, MD 125 mL/hr at 10/26/21 1142 New Bag at 10/26/21 1142       Allergies:    Allergies   Allergen Reactions   . Pcn [Penicillins] Swelling     Face and hands swelled   . Triamterene      Severe stomach pain,SOB,severe cramps throughout her body   . Ace Inhibitors Other (See Comments)     cough   . Atenolol Other (See Comments)     Bradycardia (HR 40's)   . Atorvastatin Other (See Comments)  Muscle aches   . Zithromax [Azithromycin] Swelling   . Codeine Nausea And Vomiting       Problem List:    Patient Active Problem List   Diagnosis Code   . Other hyperlipidemia E78.49   . Hypothyroidism E03.9   . Essential hypertension, benign I10   . HX: breast cancer Z85.3   . Renal insufficiency N28.9   . 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 Chronic renal disease, stage III ([place]) [981191] N18.30   . Seronegative rheumatoid arthritis ([place]) M06.00   . Major depressive disorder, recurrent, mild F33.0   . 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 Major depressive disorder, recurrent, 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       Past Medical History:        Diagnosis Date   . Anemia    . Arthritis    . Cancer ([place]) 1995    hx of breast cancer--no chemo or radiation needed   . Fall     09/2021   . GERD (gastroesophageal reflux disease)    . Heart abnormality     SEES CARDIOLOGIST J[facility] 2023   . Hx of blood clots 2001    after gallbladder surgery--blood clot in arm where IV had been   . Hypothyroidism    . Kidney failure     stage 3   . PONV  (postoperative nausea and vomiting)    . Seronegative rheumatoid arthritis ([place]) 04/11/2021   . Wears dentures    . Wears glasses     reading       Past Surgical History:        Procedure Laterality Date   . APPENDECTOMY     . ARTHROPLASTY Left 12/25/2018    LEFT MIDDLE FINGER METACARPO-PHALANGEAL JOINT SYNOVECTOMY, LIGAMENT REBALANCING, EXTENSOR TENDON CENTRALIZATION AND SILICONE METACARPOPHALANGEAL JOINT ARTHROPLASTY performed by Delene Loll, MD at [place]   . BACK SURGERY      r/t to herniated disc in lower back, neck area--screws and rods in place   . BREAST BIOPSY      left breast biopsy   . BREAST SURGERY  1995    right mastectomy   . CHOLECYSTECTOMY  2001   . COLONOSCOPY  04/12/2015    Esophagogastroduodenoscopy with biopsy, Colonoscopy, diagnostic. no findings except for diverticula and check in 10 years   . HYSTERECTOMY (CERVIX STATUS UNKNOWN)      TAH-BSO. no  cancer   . JOINT REPLACEMENT Left     left total knee replacement   . LIPOMA RESECTION      right side below right breast   . MASTECTOMY, RADICAL  1995   . UPPER GASTROINTESTINAL ENDOSCOPY  04/12/2015    and colonoscopy. hiatal hernia and gastritis       Social History:    Social History     Tobacco Use   . Smoking status: Never   . Smokeless tobacco: Never   Substance Use Topics   . Alcohol use: Yes     Comment: social-rare. once a year                                Counseling given: Not Answered      Vital Signs (Current):   Vitals:    10/23/21 1549 10/26/21 1108   BP:  117/82   Pulse:  86  Resp:  17   Temp:  97.2 F (36.2 C)   TempSrc:  Temporal   SpO2:  100%   Weight: 170 lb (77.1 kg)    Height: 5' (1.524 m) 5' (1.524 m)                                              BP Readings from Last 3 Encounters:   10/26/21 117/82   10/17/21 122/68   10/17/21 122/68       NPO Status: Time of last liquid consumption: 2100                        Time of last solid consumption: 2100                        Date of last liquid consumption: 10/25/21                         Date of last solid food consumption: 10/25/21    BMI:   Wt Readings from Last 3 Encounters:   10/23/21 170 lb (77.1 kg)   10/17/21 174 lb (78.9 kg)   10/17/21 174 lb (78.9 kg)     Body mass index is 33.2 kg/m.    CBC:   Lab Results   Component Value Date/Time    WBC 6.9 10/17/2021 12:25 PM    RBC 4.08 10/17/2021 12:25 PM    RBC 4.72 10/26/2015 11:32 AM    HGB 11.4 10/17/2021 12:25 PM    HCT 34.5 10/17/2021 12:25 PM    MCV 84.5 10/17/2021 12:25 PM    RDW 15.1 10/17/2021 12:25 PM    PLT 236 10/17/2021 12:25 PM       CMP:   Lab Results   Component Value Date/Time    NA 142 10/17/2021 12:25 PM    K 3.7 10/17/2021 12:25 PM    CL 101 10/17/2021 12:25 PM    CO2 25 10/17/2021 12:25 PM    BUN 21 10/17/2021 12:25 PM    CREATININE 1.2 10/17/2021 12:25 PM    GFRAA 33 01/03/2021 02:51 PM    GFRAA 48 10/11/2011 03:17 PM    AGRATIO 1.7 10/17/2021 12:25 PM    LABGLOM 45 10/17/2021 12:25 PM    GLUCOSE 99 10/17/2021 12:25 PM    GLUCOSE 109 10/26/2015 11:32 AM    PROT 6.4 10/17/2021 12:25 PM    PROT 7.3 10/26/2015 11:32 AM    CALCIUM 9.7 10/17/2021 12:25 PM    BILITOT 0.5 10/17/2021 12:25 PM    ALKPHOS 95 10/17/2021 12:25 PM    AST 19 10/17/2021 12:25 PM    ALT 12 10/17/2021 12:25 PM       POC Tests: No results for input(s): POCGLU, POCNA, POCK, POCCL, POCBUN, POCHEMO, POCHCT in the last 72 hours.    Coags:   Lab Results   Component Value Date/Time    PROTIME 12.8 10/17/2021 12:25 PM    INR 0.97 10/17/2021 12:25 PM    APTT 28.4 10/17/2021 12:25 PM       HCG (If Applicable): No results found for: PREGTESTUR, PREGSERUM, HCG, HCGQUANT     ABGs: No results found for: PHART, PO2ART, PCO2ART, HCO3ART, BEART, O2SATART     Type & Screen (If Applicable):  No results found for: LABABO,  [place]    Drug/Infectious Status (If Applicable):  No results found for: HIV, HEPCAB    COVID-19 Screening (If Applicable):   Lab Results   Component Value Date/Time    COVID19 Not Detected 08/21/2021 06:19 PM    COVID19 Not Detected 12/23/2018 12:22  PM           Anesthesia Evaluation  Patient summary reviewed and Nursing notes reviewed   history of anesthetic complications: PONV.  Airway: Mallampati: I  TM distance: >3 FB   Neck ROM: full  Mouth opening: > = 3 FB   Dental:    (+) upper dentures and lower dentures      Pulmonary:Negative Pulmonary ROS and normal exam                               Cardiovascular:    (+) hypertension:,                   Neuro/Psych:   (+) psychiatric history:            GI/Hepatic/Renal:   (+) GERD:,           Endo/Other:    (+) hypothyroidism: arthritis: rheumatoid., .                 Abdominal:             Vascular: negative vascular ROS.         Other Findings:           Anesthesia Plan      general     ASA 3       Induction: intravenous.    MIPS: Postoperative opioids intended and Prophylactic antiemetics administered.  Anesthetic plan and risks discussed with patient.      Plan discussed with CRNA.    Attending anesthesiologist reviewed and agrees with Preprocedure content                Rochel Brome, MD   10/26/2021

## 2021-10-26 NOTE — Progress Notes (Signed)
Ambulatory Surgery/Procedure Discharge Note    Vitals:    10/26/21 1528   BP: (!) 144/70   Pulse: 70   Resp: 16   Temp: 97 F (36.1 C)   SpO2: 99%     Pt meets discharge criteria per Aldre[place] score.  In: 700 [I.V.:700]  Out: -     Restroom use offered before discharge.  Yes    Pain assessment:  none  Pain Level: 0    Pt and S.O./family states "ready to go home". Pt alert and oriented x4. IV removed. Denies N/V or pain. Incision to R breast, OTA with surgical glue, c/d/I. Discharge instructions given to pt and daughters with pt permission. Pt and daughters verbalized understanding of all instructions. Left with all belongings, and discharge instructions.  Pt's daughters picked up two prescriptions from [facility] prior to discharge.    Patient discharged to home/self care. Patient discharged via wheel chair by transporter to waiting family/S.O.       10/26/2021 3:56 PM

## 2021-10-26 NOTE — Op Note (Signed)
[facility]     OPERATIVE DICTATION    NAME: Jodi Walls   MRN: [id number]  DATE: 10/26/2021     AGE: 81 y.o.    SURGEON: Micheline Chapman, MD  ASSISTANT: Deeann Dowse (PGY1)     PREOPERATIVE DIAGNOSIS: Acquired absence right breast, status post implant reconstruction with ruptured implant  POSTOPERATIVE DIAGNOSIS: Same     OPERATION: 1) Exchange for permanent implant right breast    ANESTHESIA: General     ESTIMATED BLOOD LOSS: <50 mL    OPERATIVE INDICATIONS: This is a 81 y.o. female who underwent mastectomy with implant reconstruction in 1995 that had been complicated by rupture a few months ago.  She desired to have replacement of this with a new implant. The plan of surgery, risks, benefits, alternatives, indications, limitations, possible complications, and future surgeries were discussed with the patient and she agreed to proceed.     OPERATIVE PROCEDURE: The patient was marked in the standing position in the preoperative holding area.  She was then brought to the operating room and placed in the supine position on the operating table. After satisfactory induction with general endotracheal anesthesia, the patient was then prepped and draped in the usual sterile fashion. The case began by incising a previous right inframammary fold.  The tissue was dissected down to the capsule, which was incised.  Seroma fluid was removed and the old, ruptured saline implant was also removed.  This was a 500cc saline implant.  The lateral aspect of the capsule was indurated and this was excised and sent to pathology as specimen. Multiple sizers were then applied to check symmetry. Once appropriate symmetry was obtained, the patient was placed back supine and the pocket was prepared for implant. The pocket was copiously irrigated with dilute Betadine followed by triple antibiotic solution.  The implant was brought to the field and then placed in triple antibiotic solution.  Using a no-touch  technique with the Keller funnel, the implant was placed into the pocket.  The tissue was closed in layers using 3-0 Monocryl followed by a sterile dressing.     The patient was then awakened, extubated, and taken to the PACU in stable condition.  At the end of the case, all counts were correct and there were no immediate complications.  The patient tolerated the procedure well.    DRAINS: None    SPECIMEN: Ruptured saline implant, capsule    CONDITION: Stable    IMPLANTS:   (Right) 290 cc Mentor MemoryGel, Smooth, Round, Moderate Profile BOOST Breast Implants, reference# [id number], lot# [id number].      Micheline Chapman, MD

## 2021-10-26 NOTE — Progress Notes (Signed)
PACU Transfer to SDS    Vitals:    10/26/21 1515   BP: (!) 157/74   Pulse: 74   Resp: 15   Temp: 97.6 F (36.4 C)   SpO2: 98%         Intake/Output Summary (Last 24 hours) at 10/26/2021 1520  Last data filed at 10/26/2021 1333  Gross per 24 hour   Intake 700 ml   Output --   Net 700 ml       Pain assessment:  not receiving treatment  Pain Level: 4    Patient transferred to care of SDS RN.    10/26/2021 3:20 PM

## 2021-10-26 NOTE — Brief Op Note (Signed)
Brief Postoperative Note      Patient: Jodi Walls  Date of Birth: 01-Feb-1941  MRN: [id number]    Date of Procedure: Nov 03, 2021    Pre-Op Diagnosis Codes:     * Rupture of implant of right breast, initial encounter [T85.43XA]    Post-Op Diagnosis: Same       Procedure(s):  EXCHANGE OF PERMANENT IMPLANT RIGHT BREAST  .    Surgeon(s):  Micheline Chapman, MD    Assistant:  Surgical Assistant: Erle Crocker  Resident: Davene Costain, DO    Anesthesia: General    Estimated Blood Loss (mL): less than 50     Complications: None    Specimens:   ID Type Source Tests Collected by Time Destination   A : RIGHT BREAST IMPLANT **GROSS ONLY** Hardware Hardware SURGICAL PATHOLOGY Micheline Chapman, MD 2021-11-03 1215    B : RIGHT BREAST CAPSULE Tissue Tissue SURGICAL PATHOLOGY Micheline Chapman, MD 11/03/2021 1216        Implants:  * No implants in log *      Drains: * No LDAs found *    Findings: See operative dictation      Electronically signed by Micheline Chapman, MD on 11-03-2021 at 1:31 PM

## 2021-10-26 NOTE — Progress Notes (Signed)
Patient A/O. VSS    IV started and LR infusing.     Cleocin to OR

## 2021-11-02 ENCOUNTER — Ambulatory Visit: Admit: 2021-11-02 | Discharge: 2021-11-02 | Payer: MEDICARE | Primary: Family Medicine

## 2021-11-02 DIAGNOSIS — Z09 Encounter for follow-up examination after completed treatment for conditions other than malignant neoplasm: Secondary | ICD-10-CM

## 2021-11-02 NOTE — Progress Notes (Signed)
[facility]    PROCEDURE: 1) Exchange for permanent implant right breast  DATE: 10/26/21    Jodi Walls has been recovering well since her procedure. Pain has been well controlled with the prescribed pain management regimen. Patient presents to the office today for 1 week post op. She does have some bilateral lower extremity swelling. She notes that she does take a prescribed diuretic however, she has not taken today's dose.     EXAM    BP 125/79   Pulse 70   Temp 98.1 F (36.7 C)   Wt 170 lb (77.1 kg)   SpO2 98%   BMI 33.20 kg/m       GEN: NAD   BREAST: Incision site healing appropriately. No hematoma/seroma. Good contour.     PATHOLOGY: pending    IMP: 81 y.o.female s/p exchange for permament implant right breast  PLAN: Doing well overall. Will follow up in 1 month with Dr. Claudia Desanctis to ensure continued wound healing. Will call with any concerns in the interim. Patient informed to notify her PCP if lower extremity swelling does not improve.     Curt Bears, APRN - [facility]  [phone]  11/02/21

## 2021-11-10 NOTE — Telephone Encounter (Signed)
-----   Message from Micheline Chapman, MD sent at 11/09/2021  5:05 PM EDT -----  No malignancy.    Thanks!  NK

## 2021-11-10 NOTE — Telephone Encounter (Signed)
Lmovm

## 2021-12-04 ENCOUNTER — Ambulatory Visit: Admit: 2021-12-04 | Discharge: 2021-12-04 | Payer: MEDICARE | Attending: Surgery | Primary: Family Medicine

## 2021-12-04 DIAGNOSIS — Z09 Encounter for follow-up examination after completed treatment for conditions other than malignant neoplasm: Secondary | ICD-10-CM

## 2021-12-04 NOTE — Progress Notes (Addendum)
[facility]    PROCEDURE: 1) Exchange for permanent implant right breast  DATE: 10/26/21    Jodi Walls has been recovering well since her procedure. Pain has been well controlled without pain medications.     EXAM    BP (!) 143/88   Pulse 75   Temp 97.3 F (36.3 C)   Wt 170 lb (77.1 kg)   SpO2 98%   BMI 33.20 kg/m     GEN: NAD   BREAST: Incision healing well  Good contour / no hematoma, seroma Some excess lateral chest tissue    IMP: 81 y.o.female s/p exchange for implant  PLAN: Doing well overall.  Will return in 6 months for evaluation to see if she would benefit from excision of the lateral chest and fat grafting.    Edison Simon, MD  [facility]  [phone]  12/04/21

## 2022-02-26 NOTE — Progress Notes (Signed)
Formatting of this note is different from the original.  Images from the original note were not included.      [facility]   [address] [place] [address]  Phone: [phone]  Fax: [phone]  [email or web address]    History of Present Illness:    Jodi ANASTAS is a 81 y.o. female who presents for follow up of  AKI on CKD.   She follows with pain management for osteoarthritis.  No hospitalizations.  She takes lasix '40mg'$  daily and KCl.  Occasional leg swelling.  She was in the ED because her breast implant ruptured, she had it repaired.  Her appetite isn't great - no taste since COVID Sept 2021. Covid again 10/2020, continues with poor appetite and no taste.     Review of Systems: No chest pain, no N/V.  Otherwise negative for all systems.    Medications    Current Outpatient Medications:     aspirin 81 mg Oral Tablet, Chewable, Take 81 mg by mouth daily., Disp: , Rfl:     DULoxetine (CYMBALTA) 60 mg Oral Capsule, Delayed Release(E.C.), Take 60 mg by mouth daily., Disp: , Rfl:     fUROsemide (LASIX) 40 mg Oral Tablet, Take 1 Tablet by mouth daily., Disp: 90 Tablet, Rfl: 3    HYDROcodone-acetaminophen (NORCO) 5-325 mg Oral Tablet, TAKE 1 TABLET BY MOUTH TWICE DAILY FOR 15 DAYS, Disp: , Rfl:     LEVOthyroxine (SYNTHROID) 75 mcg Oral Tablet, Take 1 Tab by mouth daily., Disp: , Rfl:     Melatonin 3 mg Oral Tablet, Take 3 mg by mouth nightly., Disp: , Rfl:     potassium chloride (KLOR-CON) 10 mEq Oral Tablet Sustained Release, TAKE 1 TABLET BY MOUTH TWICE DAILY, Disp: 60 Tablet, Rfl: 6    predniSONE (DELTASONE) 5 mg Oral Tablet, Take 5 mg by mouth daily., Disp: , Rfl:      Allergies   Allergen Reactions    Penicillins Swelling     Face and hands swelled    Ace Inhibitors Other (See Comments)     cough  cough     Atenolol Other (See Comments)     Bradycardia (HR 40's)    Atorvastatin Other (See Comments)     Muscle aches  Muscle aches     Azithromycin Swelling    Codeine  Nausea And Vomiting     Past Medical History:   Diagnosis Date    Chronic kidney disease     Hypertension      Family History   Problem Relation Age of Onset    Lung Cancer Mother     Cancer Father      Social History     Tobacco Use   Smoking Status Never   Smokeless Tobacco Never       Vitals:    02/26/22 1551   BP: 116/68   BP Location: Left arm   Patient Position: Sitting   Pulse: 72   Weight: 171 lb (77.6 kg)     Physical Exam:    Gen: NAD.  Comfortable and pleasant.   ENT: MMM, OP clear.  CV: RRR, no S3.    Lungs: CTA-B, no crackles.  Abd: S/NT +BS  Ext: + bilateral LE edema.  No cyanosis.  Skin: Warm.  No rashes appreciated.        Jodi Walls was seen today for chronic kidney disease.    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  SPEP normal, minimal proteinuria.  Baseline Cr around 1.0-1.1.     Her last Cr was 1.3 improved from 1.7.  Today her Cr is 1.5 - her baseline Cr may now be in the mid 1's.  GFR 34.  PTH at goal, vit D at goal at last check.  Hgb was at goal.  BP controlled.  She is avoiding NSAIDs.  She is s/p both COVID vaccines.  She follows with Dr. Rachel Bo of [facility].    Maxzide caused chest pressure and shortness of breath.      Return in about 6 months (around 08/28/2022).    Linus Orn, MD  [facility]  Office Phone: [phone]  [email or web address]    Electronically signed by Linus Orn, MD at 02/26/2022  3:59 PM EDT

## 2022-04-11 MED ORDER — LEVOTHYROXINE SODIUM 100 MCG PO TABS
100 MCG | ORAL_TABLET | Freq: Every day | ORAL | 1 refills | Status: DC
Start: 2022-04-11 — End: 2022-10-08

## 2022-04-12 ENCOUNTER — Encounter: Payer: MEDICARE | Attending: Family Medicine | Primary: Family Medicine

## 2022-04-27 ENCOUNTER — Ambulatory Visit: Admit: 2022-04-27 | Discharge: 2022-04-27 | Payer: MEDICARE | Attending: Family Medicine | Primary: Family Medicine

## 2022-04-27 DIAGNOSIS — I10 Essential (primary) hypertension: Secondary | ICD-10-CM

## 2022-04-27 MED ORDER — FUROSEMIDE 40 MG PO TABS
40 MG | ORAL_TABLET | Freq: Every day | ORAL | 2 refills | Status: DC
Start: 2022-04-27 — End: 2023-01-15

## 2022-04-27 NOTE — Progress Notes (Signed)
Subjective:      Patient ID: Jodi Walls is a 81 y.o. female.    Chief Complaint   Patient presents with    Check-Up     Hypertension, thyroid, lipids - pt is not fasting        Patient presents with:  Check-Up: Hypertension, thyroid, lipids - pt is not fasting    Here for the above and a check up  She is well and no c/o    Her meds are reviewed  She is on gabapentin and it helps her pain    She is also on steroid    Fluid in lower legs and worse end of day and better in am   For a long time    Date of Birth:  Jan 24, 1941    Date of Visit:  04/27/2022     -- Pcn [Penicillins] -- Swelling    --  Face and hands swelled   -- Triamterene     --  Severe stomach pain,SOB,severe cramps throughout             her body   -- Ace Inhibitors -- Other (See Comments)    --  cough   -- Atenolol -- Other (See Comments)    --  Bradycardia (HR 40's)   -- Atorvastatin -- Other (See Comments)    --  Muscle aches   -- Zithromax [Azithromycin] -- Swelling   -- Codeine -- Nausea And Vomiting    Current Outpatient Medications:  levothyroxine (SYNTHROID) 100 MCG tablet, TAKE 1 TABLET BY MOUTH DAILY, Disp: 90 tablet, Rfl: 1  oxyCODONE (ROXICODONE) 5 MG immediate release tablet, Take 1 tablet by mouth in the morning and 1 tablet in the evening., Disp: , Rfl:   methocarbamol (ROBAXIN) 750 MG tablet, Take 1 tablet by mouth 3 times daily, Disp: , Rfl:   vitamin D (CHOLECALCIFEROL) 25 MCG (1000 UT) TABS tablet, Take 1 tablet by mouth daily, Disp: , Rfl:   predniSONE (DELTASONE) 5 MG tablet, TAKE 1 TABLET BY MOUTH DAILY (Patient taking differently: Take 1 tablet by mouth every other day), Disp: 90 tablet, Rfl: 1  diclofenac sodium (VOLTAREN) 1 % GEL, APPLY 4 GRAMS TO LOWER EXTREMITY JOINTS AND 2 GRAMS TO UPPER EXTREMITY JOINTS FOUR TIMES DAILY AS NEEDED, Disp: 500 g, Rfl: 1  furosemide (LASIX) 40 MG tablet, Take 1 tablet by mouth daily, Disp: 30 tablet, Rfl: 2  potassium chloride (KLOR-CON) 10 MEQ extended release tablet, Take 1 tablet by mouth  2 times daily, Disp: , Rfl:   melatonin 3 MG TABS tablet, Take 1 tablet by mouth nightly, Disp: , Rfl:   aspirin 81 MG tablet, Take 1 tablet by mouth daily, Disp: , Rfl:   gabapentin (NEURONTIN) 300 MG capsule, TAKE 2 CAPSULES BY MOUTH DAILY WITH SUPPER, Disp: 180 capsule, Rfl: 0  DULoxetine (CYMBALTA) 60 MG extended release capsule, TAKE 1 CAPSULE BY MOUTH DAILY (Patient not taking: Reported on 04/27/2022), Disp: 90 capsule, Rfl: 1    No current facility-administered medications for this visit.      ---------------------------------                  04/27/22                            1027            ---------------------------------   BP:  128/80            Site:       Left Upper Arm        Position:        Sitting           Cuff Size:      Large Adult         Temp:      97.9 F (36.6 C)      TempSrc:       Temporal           Weight: 81.4 kg (179 lb 6.4 oz)   Height:      1.524 m (5')        ---------------------------------  Body mass index is 35.04 kg/m.     Wt Readings from Last 3 Encounters:  04/27/22 : 81.4 kg (179 lb 6.4 oz)  12/04/21 : 77.1 kg (170 lb)  11/02/21 : 77.1 kg (170 lb)    BP Readings from Last 3 Encounters:  04/27/22 : 128/80  12/04/21 : (!) 143/88  11/02/21 : 125/79              Review of Systems   Constitutional:  Negative for chills, fever and unexpected weight change.   Respiratory:  Negative for shortness of breath.    Cardiovascular:  Negative for chest pain and palpitations.   Gastrointestinal:  Negative for abdominal pain, blood in stool, constipation, diarrhea, nausea and vomiting.   Genitourinary:  Negative for difficulty urinating, dysuria and hematuria.   Neurological:  Negative for headaches.       Objective:   Physical Exam  Constitutional:       General: She is not in acute distress.     Appearance: Normal appearance. She is well-developed. She is not ill-appearing or diaphoretic.   Cardiovascular:      Rate and Rhythm: Normal rate  and regular rhythm.      Heart sounds: Normal heart sounds. No murmur heard.     No friction rub. No gallop.      Comments: Trace edema ankles both sides   Pulmonary:      Effort: Pulmonary effort is normal. No tachypnea, accessory muscle usage or respiratory distress.      Breath sounds: Normal breath sounds. No decreased breath sounds, wheezing, rhonchi or rales.   Lymphadenopathy:      Cervical: No cervical adenopathy.      Upper Body:      Right upper body: No supraclavicular adenopathy.      Left upper body: No supraclavicular adenopathy.   Skin:     General: Skin is warm and dry.      Coloration: Skin is not pale.   Neurological:      Mental Status: She is alert.         Assessment:         Diagnosis Orders   1. Essential hypertension, benign  TSH    Lipid Panel      2. Acquired hypothyroidism  TSH      3. Other hyperlipidemia  TSH    Lipid Panel      4. Stage 3 chronic kidney disease, unspecified whether stage 3a or 3b CKD ([place])        5. Severe obesity (BMI 35.0-39.9) with comorbidity ([place])        6. Vitamin B6 deficiency  VITAMIN B6      7. Vitamin B1 deficiency  VITAMIN B1      8. Vitamin  B2 deficiency  Vitamin B12 & Folate      9. Vitamin D deficiency  Vitamin D 25 Hydroxy      10. Neuropathy  VITAMIN B1    Vitamin B12 & Folate    VITAMIN B6          Medicare did not want to cover a hga1c and she did not want to incur the expense  She is not taking vitamin supplement now  She is only on vitamin D now     Still see dr for the RA adhikari and she wants her to be on infusion but she is not able to afford   Her taste and smell has not returned since covid 12/2019  She has seen neuro for the neuropathy back in 06/17/19. Bilateral below the knees and some in the fingers feel numb for several years up to 5 and nothing else offered at that time per her   She has had b1 and b6 def and borderline b12     She will take additional half dose of lasix as needed in the afternoon     Interval Hx:  Seen dr Dorette Grate on 02/26/22  for her chronic kidney disease and anemia of chronic disease  Seen hematology on 11/06/21 dr leuenberger  Seen by arthritis dr Tawanna Solo on 04/19/22. For her seronegative RA       Plan:      Consider the shingle vaccine  Consider the RSV vaccine   See me in 6 months         Barron Alvine, MD

## 2022-04-27 NOTE — Patient Instructions (Signed)
Consider the shingle vaccine  Consider the RSV vaccine   See me in 6 months

## 2022-05-16 ENCOUNTER — Encounter

## 2022-05-17 LAB — VITAMIN B12 & FOLATE
Folate: 6.48 ng/mL (ref 4.78–24.20)
Vitamin B-12: 419 pg/mL (ref 211–911)

## 2022-05-17 LAB — LIPID PANEL
Cholesterol, Total: 207 mg/dL — ABNORMAL HIGH (ref 0–199)
HDL: 66 mg/dL — ABNORMAL HIGH (ref 40–60)
LDL Calculated: 122 mg/dL — ABNORMAL HIGH (ref <100)
Triglycerides: 97 mg/dL (ref 0–150)
VLDL Cholesterol Calculated: 19 mg/dL

## 2022-05-17 LAB — VITAMIN D 25 HYDROXY: Vit D, 25-Hydroxy: 33.5 ng/mL (ref >=30)

## 2022-05-17 LAB — TSH: TSH: 0.48 u[IU]/mL (ref 0.27–4.20)

## 2022-05-19 LAB — VITAMIN B6: Vitamin B6, Plasma: 14.4 nmol/L — ABNORMAL LOW (ref 20.0–125.0)

## 2022-05-20 LAB — VITAMIN B1: Vitamin B1, Plasma: 10 nmol/L (ref 4–15)

## 2022-07-11 ENCOUNTER — Ambulatory Visit: Admit: 2022-07-11 | Discharge: 2022-07-11 | Payer: MEDICARE | Attending: Surgery | Primary: Family Medicine

## 2022-07-11 DIAGNOSIS — Z09 Encounter for follow-up examination after completed treatment for conditions other than malignant neoplasm: Secondary | ICD-10-CM

## 2022-07-11 NOTE — Progress Notes (Signed)
[facility]    PROCEDURE: 1) Exchange for permanent implant right breast  DATE: 10/26/21    Jodi Walls has been recovering well since her procedure. Pain has been well controlled without pain medications.     PMHx:   Past Medical History:   Diagnosis Date    Anemia     Arthritis     Cancer ([place]) 1995    hx of breast cancer--no chemo or radiation needed    Fall     09/2021    GERD (gastroesophageal reflux disease)     Heart abnormality     [facility] 2023    Hx of blood clots 2001    after gallbladder surgery--blood clot in arm where IV had been    Hypothyroidism     Kidney failure     stage 3    PONV (postoperative nausea and vomiting)     Seronegative rheumatoid arthritis ([place]) 04/11/2021    Wears dentures     Wears glasses     reading     PSHx:   Past Surgical History:   Procedure Laterality Date    APPENDECTOMY      ARTHROPLASTY Left 12/25/2018    LEFT MIDDLE FINGER METACARPO-PHALANGEAL JOINT SYNOVECTOMY, LIGAMENT REBALANCING, EXTENSOR TENDON CENTRALIZATION AND SILICONE METACARPOPHALANGEAL JOINT ARTHROPLASTY performed by Delene Loll, MD at [facility]      r/t to herniated disc in lower back, neck area--screws and rods in place    BREAST BIOPSY      left breast biopsy    BREAST CAPSULECTOMY Right 10/26/2021    . performed by Micheline Chapman, MD at [place]    right mastectomy    BREAST SURGERY Right 10/26/2021    EXCHANGE OF PERMANENT IMPLANT RIGHT BREAST performed by Micheline Chapman, MD at [place]  2001    COLONOSCOPY  04/12/2015    Esophagogastroduodenoscopy with biopsy, Colonoscopy, diagnostic. no findings except for diverticula and check in 10 years    HYSTERECTOMY (CERVIX STATUS UNKNOWN)      TAH-BSO. no  cancer    JOINT REPLACEMENT Left     left total knee replacement    LIPOMA RESECTION      right side below right breast    MASTECTOMY, RADICAL  1995    UPPER GASTROINTESTINAL ENDOSCOPY  04/12/2015    and  colonoscopy. hiatal hernia and gastritis     Allergy:   Allergies   Allergen Reactions    Pcn [Penicillins] Swelling     Face and hands swelled    Triamterene      Severe stomach pain,SOB,severe cramps throughout her body    Ace Inhibitors Other (See Comments)     cough    Atenolol Other (See Comments)     Bradycardia (HR 40's)    Atorvastatin Other (See Comments)     Muscle aches    Zithromax [Azithromycin] Swelling    Codeine Nausea And Vomiting       SHx:   Social History     Socioeconomic History    Marital status: Widowed     Spouse name: Not on file    Number of children: Not on file    Years of education: Not on file    Highest education level: Not on file   Occupational History    Not on file   Tobacco Use    Smoking  status: Never    Smokeless tobacco: Never   Vaping Use    Vaping Use: Never used   Substance and Sexual Activity    Alcohol use: Yes     Comment: social-rare. once a year    Drug use: No    Sexual activity: Not Currently   Other Topics Concern    Not on file   Social History Narrative    Not on file     Social Determinants of Health     Financial Resource Strain: Low Risk  (09/04/2021)    Overall Financial Resource Strain (CARDIA)     Difficulty of Paying Living Expenses: Not hard at all   Food Insecurity: Not on file (09/04/2021)   Transportation Needs: Unknown (09/04/2021)    PRAPARE - Armed forces logistics/support/administrative officer (Medical): Not on file     Lack of Transportation (Non-Medical): No   Physical Activity: Inactive (10/17/2021)    Exercise Vital Sign     Days of Exercise per Week: 0 days     Minutes of Exercise per Session: 0 min   Stress: Not on file   Social Connections: Not on file   Intimate Partner Violence: Not on file   Housing Stability: Unknown (09/04/2021)    Housing Stability Vital Sign     Unable to Pay for Housing in the Last Year: Not on file     Number of [place] in the Last Year: Not on file     Unstable Housing in the Last Year: No     FHx: Family history reviewed and is  noncontributory (no breast CA)    Meds:   Current Outpatient Medications   Medication Sig Dispense Refill    furosemide (LASIX) 40 MG tablet Take 1 tablet by mouth daily May take an additional half tablet in the after noon daily  if needed for swelling 30 tablet 2    levothyroxine (SYNTHROID) 100 MCG tablet TAKE 1 TABLET BY MOUTH DAILY 90 tablet 1    oxyCODONE (ROXICODONE) 5 MG immediate release tablet Take 1 tablet by mouth in the morning and 1 tablet in the evening.      methocarbamol (ROBAXIN) 750 MG tablet Take 1 tablet by mouth 3 times daily      gabapentin (NEURONTIN) 300 MG capsule TAKE 2 CAPSULES BY MOUTH DAILY WITH SUPPER 180 capsule 0    vitamin D (CHOLECALCIFEROL) 25 MCG (1000 UT) TABS tablet Take 1 tablet by mouth daily      predniSONE (DELTASONE) 5 MG tablet TAKE 1 TABLET BY MOUTH DAILY (Patient taking differently: Take 1 tablet by mouth every other day) 90 tablet 1    DULoxetine (CYMBALTA) 60 MG extended release capsule TAKE 1 CAPSULE BY MOUTH DAILY (Patient not taking: Reported on 04/27/2022) 90 capsule 1    diclofenac sodium (VOLTAREN) 1 % GEL APPLY 4 GRAMS TO LOWER EXTREMITY JOINTS AND 2 GRAMS TO UPPER EXTREMITY JOINTS FOUR TIMES DAILY AS NEEDED 500 g 1    potassium chloride (KLOR-CON) 10 MEQ extended release tablet Take 1 tablet by mouth 2 times daily      melatonin 3 MG TABS tablet Take 1 tablet by mouth nightly      aspirin 81 MG tablet Take 1 tablet by mouth daily       No current facility-administered medications for this visit.       ROS   Constitutional: Negative for chills and fever.   HENT: Negative for congestion, facial swelling, and voice  change.    Eyes: Negative for photophobia and visual disturbance.   Respiratory: Negative for apnea, cough, chest tightness and shortness of breath.    Cardiovascular: Negative for chest pain and palpitations.   Gastrointestinal: Negative for dysphagia and early satiety.  Genitourinary: Negative for difficulty urinating, dysuria, flank pain, frequency  and hematuria.   Musculoskeletal: Negative for new gait problem, joint swelling and myalgias.   Skin: Negative for color change, pallor and rash.   Endocrine: negative for tremors, temperature intolerance or polydipsia.  Allergic/Immunologic: Negative for new environmental or food allergies.  Neurological: Negative for dizziness, seizures, speech difficulty, numbness.   Hematological: Negative for adenopathy.   Psychiatric/Behavioral: Negative for agitation and confusion.    EXAM    BP (!) 145/80   Pulse 83   Temp 97.6 F (36.4 C) (Temporal)   Resp 16   Ht 1.524 m (5')   Wt 83.9 kg (185 lb)   SpO2 100%   BMI 36.13 kg/m      GEN: NAD   BREAST: Incision healing well  Medial deficiency / no hematoma, seroma Some excess lateral chest tissue    IMP: 81 y.o.female s/p exchange for implant  PLAN: Doing well overall.  Will return in 6 months for evaluation to see if she would benefit from excision of the lateral chest and fat grafting.     Leland Her, MD  [facility]  [phone]  07/11/22

## 2022-08-02 ENCOUNTER — Encounter: Admit: 2022-08-02 | Discharge: 2022-08-02 | Payer: MEDICARE | Attending: Otolaryngology | Primary: Family Medicine

## 2022-08-02 DIAGNOSIS — J342 Deviated nasal septum: Secondary | ICD-10-CM

## 2022-08-02 MED ORDER — IPRATROPIUM BROMIDE 0.06 % NA SOLN
0.06 | Freq: Four times a day (QID) | NASAL | 0 refills | Status: AC
Start: 2022-08-02 — End: ?

## 2022-08-02 NOTE — Progress Notes (Signed)
[facility]  DIVISION OF OTOLARYNGOLOGY- HEAD & NECK SURGERY  CONSULT      AEISHA REUTOV (DOB:  04/03/1941) is a 82 y.o. female, here for evaluation of the following chief complaint(s):  Other (Nose hurts )      ASSESSMENT/PLAN:  1. Deviated nasal septum  2. Nasal pain  3. Anosmia  4. Chronic sphenoidal sinusitis  5. Chronic rhinitis      This is a very pleasant 82 y.o. female here today for evaluation of the the above-noted complaints.      -Start Atrovent for chronic rhinitis.  We discussed that we could consider something such as cryoablation of the posterior nasal nerves if she is interested  -Discussed with the patient that I suspect that this chronic facial pain is related to her prior trauma.  At present, there is not much to do about it except give it some time to see if it resolves.  She is already on gabapentin.  We discussed other conservative measures.  -Discussed with the patient that there is no evidence of any mass lesions that could be causing her anosmia.  It sounds like it was following COVID.  We discussed smell retraining therapy and other treatment options.  We discussed at this point it is likely chronic and she is unlikely to regain any sense of smell  -Her CT scan showed concern for fungal ball and chronic left-sided sphenoid sinusitis.  I did not see any evidence of purulence draining from the sphenoethmoidal recess.  We discussed treatment options for this including medical and surgical.    Medical Decision Making:  The following items were considered in medical decision making:  Independent review of images  Review / order clinical lab tests  Review / order radiology tests  Decision to obtain old records  Review and summation of old records as accessed through [facility] if applicable    SUBJECTIVE/OBJECTIVE:  HPI    MERAIAH PETTERSON is here today for evaluation of multiple complaints.  The patient states that approximately 3 years ago she lost her sense of smell and taste following  COVID.  She tried some essential oils but that did not help significantly.  She has not really regained anything at this point.  She can taste salty and sweet things.  She does not have a diagnosis of dementia or other memory issues.    The patient has issues related to chronic rhinitis.  States that she constantly has a runny nose.  It is clear and bilateral.  It is not salty or metallic.  She does not get frequent sinus infections.  She does get some nasal obstruction.  She has not tried intranasal corticosteroid sprays.    The patient also has issues related to recent trauma of her nose which occurred in June 2023.  She had some CT scan reports which I independently reviewed.  It did not show any evidence of facial trauma.  They did show evidence of chronic left-sided sphenoid sinus disease.  She is now having facial pain when she yawns and sneezes.  She also feels like there is some discoloration of the dorsum of her nose.          REVIEW OF SYSTEMS  The following systems were reviewed and revealed the following in addition to any already discussed in the HPI:    PHYSICAL EXAM    GENERAL: No acute distress, alert and oriented, no hoarseness, strong voice  EYES: EOMI, Anti-icteric  HENT:   Head: Normocephalic  and atraumatic.   Face:  Symmetric, facial nerve intact  Right Ear: Normal external ear, normal external auditory canal, intact tympanic membrane with normal mobility and aerated middle ear  Left Ear: Normal external ear, normal external auditory canal, intact tympanic membrane with normal mobility and aerated middle ear  Mouth/Oral Cavity:  normal lips, Uvula is midline, no mucosal lesions, no trismus, normal dentition, normal salivary quality/flow  Oropharynx/Larynx:  normal oropharynx, 1+ tonsils  Nose:Normal external nasal appearance.  Anterior rhinoscopy shows  a deviated septum preventing view posteriorly.  Moderate swelling of turbinates.  Normal mucosa   NECK: Normal range of motion, no thyromegaly,  trachea is midline, no lymphadenopathy, no neck masses, no crepitus  CHEST: Normal respiratory effort, no retractions, breathing comfortably  SKIN: No rashes, normal appearing skin, no evidence of skin lesions/tumors  Neuro:  cranial nerve II-XII intact; normal gait  Cardio:  no edema        PROCEDURE  Nasal Endoscopy (CPT code 00938)       Due to the patients chronic sinus disease and/or history of sinonasal neoplasm for surveillance a nasal endoscopy with or without debridement will be performed to complete a significant physical examination of the patient which cannot be performed by anterior rhinoscopy alone (failure of complete examination of the paranasal sinuses). Failure to provide this procedure may lead to late detection of significant chronic benign disease, acute exacerbation, resolution or failure of early diagnosis of recurrent cancer. The procedure report is present in the body of the chart.     Verbal consent was received. After topical anesthesia and decongestion had been obtained using aerosolized 1% lidocaine and oxymetazoline, a 45 degree rigid endoscope was placed into both nares with the patient in a sitting position. The following was observed:        Septum: intact and deviated   Other:   -The inferior and middle turbinates were examined.  The middle meatus, and sphenoethmoid recess was examined bilaterally.    -No polyps or purulence.  No skull base masses of the olfactory cleft..   -There were no complications.        Tolerated well without complication. I attest that I was present for and did the entire procedure myself.      This note was generated completely or in part utilizing Dragon dictation speech recognition software.  Occasionally, words are mistranscribed and despite editing, the text may contain inaccuracies due to incorrect word recognition.  If further clarification is needed please contact the office at [phone].    An electronic signature was used to authenticate this  note.    --[name], MD

## 2022-10-08 MED ORDER — LEVOTHYROXINE SODIUM 100 MCG PO TABS
100 MCG | ORAL_TABLET | Freq: Every day | ORAL | 1 refills | Status: DC
Start: 2022-10-08 — End: 2023-04-10

## 2022-10-26 ENCOUNTER — Ambulatory Visit: Admit: 2022-10-26 | Discharge: 2022-10-26 | Payer: MEDICARE | Attending: Family Medicine | Primary: Family Medicine

## 2022-10-26 ENCOUNTER — Encounter

## 2022-10-26 ENCOUNTER — Telehealth

## 2022-10-26 DIAGNOSIS — Z0000 Encounter for general adult medical examination without abnormal findings: Principal | ICD-10-CM

## 2022-10-26 DIAGNOSIS — I10 Essential (primary) hypertension: Secondary | ICD-10-CM

## 2022-10-26 LAB — BASIC METABOLIC PANEL
Anion Gap: 12 (ref 3–16)
BUN: 20 mg/dL (ref 7–20)
CO2: 27 mmol/L (ref 21–32)
Calcium: 10 mg/dL (ref 8.3–10.6)
Chloride: 103 mmol/L (ref 99–110)
Creatinine: 1.4 mg/dL — ABNORMAL HIGH (ref 0.6–1.2)
Est, Glom Filt Rate: 37 — AB (ref >60)
Glucose: 100 mg/dL — ABNORMAL HIGH (ref 70–99)
Potassium: 3.6 mmol/L (ref 3.5–5.1)
Sodium: 142 mmol/L (ref 136–145)

## 2022-10-26 MED ORDER — CLOTRIMAZOLE-BETAMETHASONE 1-0.05 % EX CREA
1-0.05 | CUTANEOUS | 0 refills | 30.00 days | Status: DC
Start: 2022-10-26 — End: 2023-01-15

## 2022-10-26 NOTE — Addendum Note (Signed)
Addended by: [name] on: 10/26/2022 04:35 PM     Modules accepted: Orders

## 2022-10-26 NOTE — Telephone Encounter (Signed)
Left msg for Becky to return call.

## 2022-10-26 NOTE — Patient Instructions (Signed)
Do get the ok from dr Katrinka Blazing for the upcoming surgery  Do take over the counter vitamin B6 also called pyridoxine every day   Use the cream twice a day sparingly for the next 10 days   See me back in August as scheduled

## 2022-10-26 NOTE — Progress Notes (Signed)
Medicare Annual Wellness Visit    Jodi Walls is here for Medicare AWV    Assessment & Plan   Medicare annual wellness visit, subsequent  Recommendations for Preventive Services Due: see orders and patient instructions/AVS.  Recommended screening schedule for the next 5-10 years is provided to the patient in written form: see Patient Instructions/AVS.     Return in 1 year (on 10/26/2023).     Subjective   Medicare AWV    Patient's complete Health Risk Assessment and screening values have been reviewed and are found in Flowsheets. The following problems were reviewed today and where indicated follow up appointments were made and/or referrals ordered.    Positive Risk Factor Screenings with Interventions:    Fall Risk:  Do you feel unsteady or are you worried about falling? : (!) yes ([name] has a cane)  2 or more falls in past year?: (!) yes  Fall with injury in past year?: (!) yes     Interventions:    Reviewed medications, home hazards, visual acuity, and co-morbidities that can increase risk for falls  See AVS for additional education material     Depression:  PHQ-2 Score: 3  PHQ-9 Total Score: 13    Interpretation:  5-9 mild   10-14 moderate   15-19 moderately severe   20-27 severe     Interventions:  Patient comments: Patient states she has good days and bad days, most due to her not being able to be as active as once was. She states she does not currently see anyone regarding this and denied any referrals at this time. She denies harming her herself in anyway.        Controlled Medication Review:      Today's Pain Level: No data recorded   Opioid Risk: (Low risk score <55) Opioid risk score: 16    Patient is low risk for opioid use disorder or overdose.    Last PDMP [name] as Reviewed:  Review User Review Instant Review Result              Last Controlled Substance Monitoring Documentation      Flowsheet Row Telephone from 11/22/2016 in [facility]   Attestation The Prescription Monitoring  Report for this patient was reviewed today. filed at 11/22/2016 1511   Periodic Controlled Substance Monitoring No signs of potential drug abuse or diversion identified. filed at 11/22/2016 1511               Activity, Diet, and Weight:  On average, how many days per week do you engage in moderate to strenuous exercise (like a brisk walk)?: 0 days  On average, how many minutes do you engage in exercise at this level?: Patient declined    Do you eat balanced/healthy meals regularly?:  (Patient encouraged to eat healthy)    There is no height or weight on file to calculate BMI. (!) Abnormal    Do you eat balanced/healthy meals regularly Interventions:  See AVS for additional education material  Obesity Interventions:  See AVS for additional education material            Dentist Screen:  Have you seen the dentist within the past year?:  (Patient encouraged to follow up with provider)    Intervention:  See AVS for additional education material     Vision Screen:  Do you have difficulty driving, watching TV, or doing any of your daily activities because of your eyesight?: No  Have you had  an eye exam within the past year?:  (Patietn encourged to follow up with provider)  No results found.    Interventions:   See AVS for additional education material     ADL's:   Patient reports needing help with:  Select all that apply: (!) Walking/Balance  Interventions:  See AVS for additional education material    Advanced Directives:  Do you have a Living Will?: (!) No    Intervention:  has NO advanced directive - information provided                     Objective   There were no vitals filed for this visit.   There is no height or weight on file to calculate BMI.               Allergies   Allergen Reactions    Pcn [Penicillins] Swelling     Face and hands swelled    Triamterene      Severe stomach pain,SOB,severe cramps throughout her body    Ace Inhibitors Other (See Comments)     cough    Atenolol Other (See Comments)      Bradycardia (HR 40's)    Atorvastatin Other (See Comments)     Muscle aches    Zithromax [Azithromycin] Swelling    Codeine Nausea And Vomiting     Prior to Visit Medications    Medication Sig Taking? Authorizing Provider   VITAMIN E PO Take by mouth Yes [provider]   clotrimazole-betamethasone (LOTRISONE) 1-0.05 % cream Apply topically 2 times daily. Sparingly for 10 days to the corners of the mouth Yes [name], MD   levothyroxine (SYNTHROID) 100 MCG tablet TAKE 1 TABLET BY MOUTH DAILY Yes [name], MD   furosemide (LASIX) 40 MG tablet Take 1 tablet by mouth daily May take an additional half tablet in the after noon daily  if needed for swelling Yes [name], MD   oxyCODONE (ROXICODONE) 5 MG immediate release tablet Take 1 tablet by mouth in the morning and 1 tablet in the evening. Yes [provider]   methocarbamol (ROBAXIN) 750 MG tablet Take 1 tablet by mouth 3 times daily Yes [provider]   gabapentin (NEURONTIN) 300 MG capsule TAKE 2 CAPSULES BY MOUTH DAILY WITH SUPPER Yes [name], MD   vitamin D (CHOLECALCIFEROL) 25 MCG (1000 UT) TABS tablet Take 1 tablet by mouth daily Yes [provider]   predniSONE (DELTASONE) 5 MG tablet TAKE 1 TABLET BY MOUTH DAILY  Patient taking differently: Take 1 tablet by mouth every other day Yes [name], MD   DULoxetine (CYMBALTA) 60 MG extended release capsule TAKE 1 CAPSULE BY MOUTH DAILY Yes [name], MD   potassium chloride (KLOR-CON) 10 MEQ extended release tablet Take 1 tablet by mouth 2 times daily Yes [provider]   melatonin 3 MG TABS tablet Take 1 tablet by mouth nightly Yes [provider]   aspirin 81 MG tablet Take 1 tablet by mouth daily Yes [provider]   ipratropium (ATROVENT) 0.06 % nasal spray 2 sprays by Each Nostril route 4 times daily  Patient not taking: Reported on 10/26/2022  [name], MD   diclofenac sodium (VOLTAREN) 1 % GEL APPLY 4 GRAMS TO LOWER  EXTREMITY JOINTS AND 2 GRAMS TO UPPER EXTREMITY JOINTS FOUR TIMES DAILY AS NEEDED  Patient not taking: Reported on 10/26/2022  [name], MD       CareTeam (Including  outside providers/suppliers regularly involved in providing care):   Patient Care Team:  [name], MD as PCP - General  Leuenberger, Rosalia Hammers., MD as PCP - Hematology/Oncology (Hematology and Oncology)  [name], MD as PCP - Empaneled Provider     Reviewed and updated this visit:  Meds       I, Domenic Moras, LPN, 1/61/0960, performed the documented evaluation under the direct supervision of the attending physician.    This encounter was performed under my, [name], MD's, direct supervision, 10/26/2022.

## 2022-10-26 NOTE — Patient Instructions (Signed)
Preventing Falls: Care Instructions  Injuries and health problems such as trouble walking or poor eyesight can increase your risk of falling. So can some medicines. But there are things you can do to help prevent falls. You can exercise to get stronger. You can also arrange your home to make it safer.    Talk to your doctor about the medicines you take. Ask if any of them increase the risk of falls and whether they can be changed or stopped.   Try to exercise regularly. It can help improve your strength and balance. This can help lower your risk of falling.         Practice fall safety and prevention.   Wear low-heeled shoes that fit well and give your feet good support. Talk to your doctor if you have foot problems that make this hard.  Carry a cellphone or wear a medical alert device that you can use to call for help.  Use stepladders instead of chairs to reach high objects. Don't climb if you're at risk for falls. Ask for help, if needed.  Wear the correct eyeglasses, if you need them.        Make your home safer.   Remove rugs, cords, clutter, and furniture from walkways.  Keep your house well lit. Use night-lights in hallways and bathrooms.  Install and use sturdy handrails on stairways.  Wear nonskid footwear, even inside. Don't walk barefoot or in socks without shoes.        Be safe outside.   Use handrails, curb cuts, and ramps whenever possible.  Keep your hands free by using a shoulder bag or backpack.  Try to walk in well-lit areas. Watch out for uneven ground, changes in pavement, and debris.  Be careful in the winter. Walk on the grass or gravel when sidewalks are slippery. Use de-icer on steps and walkways. Add non-slip devices to shoes.    Put grab bars and nonskid mats in your shower or tub and near the toilet. Try to use a shower chair or bath bench when bathing.   Get into a tub or shower by putting in your weaker leg first. Get out with your strong side first. Have a phone or medical alert  device in the bathroom with you.   Where can you learn more?  Go to [employer] and enter G117 to learn more about "Preventing Falls: Care Instructions."  Current as of: November 13, 2021  Content Version: 14.1   2006-2024 [employer], Inc[employer].   Care instructions adapted under license by [facility]. If you have questions about a medical condition or this instruction, always ask your healthcare professional. Healthwise, Incorporated disclaims any warranty or liability for your use of this information.           Learning About Mindfulness for Stress  What are mindfulness and stress?     Stress is your body's response to a hard situation. Your body can have a physical, emotional, or mental response. A lot of things can cause stress. You may feel stress when you go on a job interview, take a test, or run a race. This kind of short-term stress is normal and even useful. It can help you if you need to work hard or react quickly.  Stress also can last a long time. Long-term stress is caused by stressful situations or events. Examples of long-term stress include long-term health problems, ongoing problems at work, and conflicts in your family. Long-term stress can harm your health.  Mindfulness is a focus only on things happening in the present moment. It's a process of purposefully paying attention to and being aware of your surroundings, your emotions, your thoughts, and how your body feels. You are aware of these things, but you aren't judging these experiences as "good" or "bad." Mindfulness can help you learn to calm your mind and body to help you cope with illness, pain, and stress.  How does mindfulness help to relieve stress?  Mindfulness can help quiet your mind and relax your body. Studies show that it can help some people sleep better, feel less anxious, and bring their blood pressure down. And it's been shown to help some people live and cope better with certain health problems like  heart disease, depression, chronic pain, and cancer.  How do you practice mindfulness?  To be mindful is to pay attention, to be present, and to be accepting. Like any new skill or habit, being mindful can take practice.  When you're mindful, you do just one thing and you pay close attention to that one thing. For example, you may sit quietly and notice your emotions or how your food tastes and smells.  When you're present, you focus on the things that are happening right now. You let go of your thoughts about the past and the future. When you dwell on the past or the future, you miss moments that can heal and strengthen you. You may miss moments like hearing a child laugh or seeing a friendly face when you think you're all alone.  When you're accepting, you don't judge the present moment. Instead you accept your thoughts and feelings as they come.  You can practice anytime, anywhere, and in any way you choose. You can practice in many ways. Here are a few ideas:  While doing your chores, like washing the dishes, let your mind focus on what's in your hand. What does the dish feel like? Is the water warm or cold?  Go outside and take a few deep breaths. What is the air like? Is it warm or cold?  When you can, take some time at the start of your day to sit alone and think.  Take a slow walk by yourself. Count your steps while you breathe in and out.  Try yoga breathing exercises, stretches, and poses to strengthen and relax your muscles.  At work, if you can, try to stop for a few moments each hour. Note how your body feels. Let yourself regroup and let your mind settle before you return to what you were doing.  If you struggle with anxiety or "worry thoughts," imagine your mind as a blue sky and your worry thoughts as clouds. Now imagine those worry thoughts floating across your mind's sky. Just let them pass by as you watch.  Follow-up care is a key part of your treatment and safety. Be sure to make and go to all  appointments, and call your doctor if you are having problems. It's also a good idea to know your test results and keep a list of the medicines you take.  Where can you learn more?  Go to [employer] and enter M676 to learn more about "Learning About Mindfulness for Stress."  Current as of: October 21, 2021  Content Version: 14.1   2006-2024 Healthwise, Incorporated.   Care instructions adapted under license by [facility]. If you have questions about a medical condition or this instruction, always ask your healthcare professional. [employer] disclaims any warranty  or liability for your use of this information.           Learning About Dental Care for Older Adults  Dental care for older adults: Overview  Dental care for older people is much the same as for younger adults. But older adults do have concerns that younger adults do not. Older adults may have problems with gum disease and decay on the roots of their teeth. They may need missing teeth replaced or broken fillings fixed. Or they may have dentures that need to be cared for. Some older adults may have trouble holding a toothbrush.  You can help remind the person you are caring for to brush and floss their teeth or to clean their dentures. In some cases, you may need to do the brushing and other dental care tasks. People who have trouble using their hands or who have dementia may need this extra help.  How can you help with dental care?  Normal dental care  To keep the teeth and gums healthy:  Brush the teeth with fluoride toothpaste twice a day--in the morning and at night--and floss at least once a day. Plaque can quickly build up on the teeth of older adults.  Watch for the signs of gum disease. These signs include gums that bleed after brushing or after eating hard foods, such as apples.  See a dentist regularly. Many experts recommend checkups every 6 months.  Keep the dentist up to date on any new medications the  person is taking.  Encourage a balanced diet that includes whole grains, vegetables, and fruits, and that is low in saturated fat and sodium.  Encourage the person you're caring for not to use tobacco products. They can affect dental and general health.  Many older adults have a fixed income and feel that they can't afford dental care. But most towns and cities have programs in which dentists help older adults by lowering fees. Contact your area's public health offices or social services for information about dental care in your area.  Using a toothbrush  Older adults with arthritis sometimes have trouble brushing their teeth because they can't easily hold the toothbrush. Their hands and fingers may be stiff, painful, or weak. If this is the case, you can:  Offer an Mining engineer toothbrush.  Enlarge the handle of a non-electric toothbrush by wrapping a sponge, an elastic bandage, or adhesive tape around it.  Push the toothbrush handle through a ball made of rubber or soft foam.  Make the handle longer and thicker by taping Popsicle sticks or tongue depressors to it.  You may also be able to buy special toothbrushes, toothpaste dispensers, and floss holders.  Your doctor may recommend a soft-bristle toothbrush if the person you care for bleeds easily. Bleeding can happen because of a health problem or from certain medicines.  A toothpaste for sensitive teeth may help if the person you care for has sensitive teeth.  How do you brush and floss someone's teeth?  If the person you are caring for has a hard time cleaning their teeth on their own, you may need to brush and floss their teeth for them. It may be easiest to have the person sit and face away from you, and to sit or stand behind them. That way you can steady their head against your arm as you reach around to floss and brush their teeth. Choose a place that has good lighting and is comfortable for both of you.  Before you begin,  gather your supplies. You will need  gloves, floss, a toothbrush, and a container to hold water if you are not near a sink. Wash and dry your hands well and put on gloves. Start by flossing:  Gently work a piece of floss between each of the teeth toward the gums. A plastic flossing tool may make this easier, and they are available at most drugstores.  Curve the floss around each tooth into a U-shape and gently slide it under the gum line.  Move the floss firmly up and down several times to scrape off the plaque.  After you've finished flossing, throw away the used floss and begin brushing:  Wet the brush and apply toothpaste.  Place the brush at a 45-degree angle where the teeth meet the gums. Press firmly, and move the brush in small circles over the surface of the teeth.  Be careful not to brush too hard. Vigorous brushing can make the gums pull away from the teeth and can scratch the tooth enamel.  Brush all surfaces of the teeth, on the tongue side and on the cheek side. Pay special attention to the front teeth and all surfaces of the back teeth.  Brush chewing surfaces with short back-and-forth strokes.  After you've finished, help the person rinse the remaining toothpaste from their mouth.  Where can you learn more?  Go to [employer] and enter F944 to learn more about "Learning About Dental Care for Older Adults."  Current as of: December 03, 2021  Content Version: 14.1   2006-2024 Healthwise, Incorporated.   Care instructions adapted under license by [facility]. If you have questions about a medical condition or this instruction, always ask your healthcare professional. Healthwise, Incorporated disclaims any warranty or liability for your use of this information.           Learning About Vision Tests  What are vision tests?     The four most common vision tests are visual acuity tests, refraction, visual field tests, and color vision tests.  Visual acuity (sharpness) tests  These tests are used:  To see if you need  glasses or contact lenses.  To monitor an eye problem.  To check an eye injury.  Visual acuity tests are done as part of routine exams. You may also have this test when you get your driver's license or apply for some types of jobs.  Visual field tests  These tests are used:  To check for vision loss in any area of your range of vision.  To screen for certain eye diseases.  To look for nerve damage after a stroke, head injury, or other problem that could reduce blood flow to the brain.  Refraction and color tests  A refraction test is done to find the right prescription for glasses and contact lenses.  A color vision test is done to check for color blindness.  Color vision is often tested as part of a routine exam. You may also have this test when you apply for a job where recognizing different colors is important, such as truck driving, Optician, dispensing, or the [employer].  How are vision tests done?  Visual acuity test   You cover one eye at a time.  You read aloud from a wall chart across the room.  You read aloud from a small card that you hold in your hand.  Refraction   You look into a special device.  The device puts lenses of different strengths in front of each eye to  see how strong your glasses or contact lenses need to be.  Visual field tests   Your doctor may have you look through special machines.  Or your doctor may simply have you stare straight ahead while they move a finger into and out of your field of vision.  Color vision test   You look at pieces of printed test patterns in various colors. You say what number or symbol you see.  Your doctor may have you trace the number or symbol using a pointer.  How do these tests feel?  There is very little chance of having a problem from this test. If dilating drops are used for a vision test, they may make the eyes sting and cause a medicine taste in the mouth.  Follow-up care is a key part of your treatment and safety. Be sure to make and go to all appointments,  and call your doctor if you are having problems. It's also a good idea to know your test results and keep a list of the medicines you take.  Where can you learn more?  Go to [employer] and enter G551 to learn more about "Learning About Vision Tests."  Current as of: October 02, 2021  Content Version: 14.1   2006-2024 Healthwise, Incorporated.   Care instructions adapted under license by [facility]. If you have questions about a medical condition or this instruction, always ask your healthcare professional. Healthwise, Incorporated disclaims any warranty or liability for your use of this information.           Learning About Activities of Daily Living  What are activities of daily living?     Activities of daily living (ADLs) are the basic self-care tasks you do every day. These include eating, bathing, dressing, and moving around.  As you age, and if you have health problems, you may find that it's harder to do some of these tasks. If so, your doctor can suggest ideas that may help.  To measure what kind of help you may need, your doctor will ask how well you are able to do ADLs. Let your doctor know if there are any tasks that you are having trouble doing. This is an important first step to getting help. And when you have the help you need, you can stay as independent as possible.  How will a doctor assess your ADLs?  Asking about ADLs is part of a routine health checkup your doctor will likely do as you age. Your health check might be done in a doctor's office, in your home, or at a hospital. The goal is to find out if you are having any problems that could make it hard to care for yourself or that make it unsafe for you to be on your own.  To measure your ADLs, your doctor will ask how hard it is for you to do routine tasks. Your doctor may also want to know if you have changed the way you do a task because of a health problem. Your doctor may watch how you:  Walk back and forth.  Keep  your balance while you stand or walk.  Move from sitting to standing or from a bed to a chair.  Button or unbutton a [employer] fast streamer.  Remove and put on your shoes.  It's common to feel a little worried or anxious if you find you can't do all the things you used to be able to do. Talking with your doctor about ADLs is a  way to make sure you're as safe as possible and able to care for yourself as well as you can. You may want to bring a caregiver, friend, or family member to your checkup. They can help you talk to your doctor.  Follow-up care is a key part of your treatment and safety. Be sure to make and go to all appointments, and call your doctor if you are having problems. It's also a good idea to know your test results and keep a list of the medicines you take.  Current as of: February 20, 2022  Content Version: 14.1   2006-2024 Healthwise, Incorporated.   Care instructions adapted under license by [facility]. If you have questions about a medical condition or this instruction, always ask your healthcare professional. Healthwise, Incorporated disclaims any warranty or liability for your use of this information.           Advance Directives: Care Instructions  Overview  An advance directive is a legal way to state your wishes at the end of your life. It tells your family and your doctor what to do if you can't say what you want.  There are two main types of advance directives. You can change them any time your wishes change.  Living will.  This form tells your family and your doctor your wishes about life support and other treatment. The form is also called a declaration.  Medical power of attorney.  This form lets you name a person to make treatment decisions for you when you can't speak for yourself. This person is called a health care agent (health care proxy, health care surrogate). The form is also called a durable power of attorney for health care.  If you do not have an advance directive, decisions  about your medical care may be made by a family member, or by a doctor or a judge who doesn't know you.  It may help to think of an advance directive as a gift to the people who care for you. If you have one, they won't have to make tough decisions by themselves.  For more information, including forms for your state, see the [employer] website ([employer]).  Follow-up care is a key part of your treatment and safety. Be sure to make and go to all appointments, and call your doctor if you are having problems. It's also a good idea to know your test results and keep a list of the medicines you take.  What should you include in an advance directive?  Many states have a unique advance directive form. (It may ask you to address specific issues.) Or you might use a universal form that's approved by many states.  If your form doesn't tell you what to address, it may be hard to know what to include in your advance directive. Use the questions below to help you get started.  Who do you want to make decisions about your medical care if you are not able to?  What life-support measures do you want if you have a serious illness that gets worse over time or can't be cured?  What are you most afraid of that might happen? (Maybe you're afraid of having pain, losing your independence, or being kept alive by machines.)  Where would you prefer to die? (Your home? A hospital? A nursing home?)  Do you want to donate your organs when you die?  Do you want certain religious practices performed before you die?  When should you call for  help?  Be sure to contact your doctor if you have any questions.  Where can you learn more?  Go to [employer] and enter R264 to learn more about "Advance Directives: Care Instructions."  Current as of: March 15, 2022  Content Version: 14.1   2006-2024 Healthwise, Incorporated.   Care instructions adapted under license by [facility]. If you  have questions about a medical condition or this instruction, always ask your healthcare professional. Healthwise, Incorporated disclaims any warranty or liability for your use of this information.           Starting a Weight Loss Plan: Care Instructions  Overview     It can be a challenge to lose weight. But your doctor can help you make a weight-loss plan that meets your needs.  You don't have to make a lot of big changes at once. A better idea might be to focus on small changes and stick with them. When those changes become habit, you can add a few more changes.  Some people find it helpful to take an exercise or nutrition class. If you have questions, ask your doctor about seeing a registered dietitian or an exercise specialist. You might also think about joining a weight-loss support group.  If you're not ready to make changes right now, try to pick a date in the future. Then make an appointment with your doctor to talk about when and how you'll get started with a plan.  Follow-up care is a key part of your treatment and safety. Be sure to make and go to all appointments, and call your doctor if you are having problems. It's also a good idea to know your test results and keep a list of the medicines you take.  How can you care for yourself at home?  Set realistic goals. Many people expect to lose much more weight than is likely. A weight loss of 5% to 10% of your body weight may be enough to improve your health.  Get family and friends involved to provide support. Talk to them about why you are trying to lose weight, and ask them to help. They can help by participating in exercise and having meals with you, even if they may be eating something different.  Find what works best for you. If you do not have time or do not like to cook, a program that offers meal replacement bars or shakes may be better for you. Or if you like to prepare meals, finding a plan that includes daily menus and recipes may be best.  Ask  your doctor about other health professionals who can help you achieve your weight loss goals.  A dietitian can help you make healthy changes in your diet.  An exercise specialist or personal trainer can help you develop a safe and effective exercise program.  A counselor or psychiatrist can help you cope with issues such as depression, anxiety, or family problems that can make it hard to focus on weight loss.  Consider joining a support group for people who are trying to lose weight. Your doctor can suggest groups in your area.  Where can you learn more?  Go to [employer] and enter U357 to learn more about "Starting a Weight Loss Plan: Care Instructions."  Current as of: January 17, 2022  Content Version: 14.1   2006-2024 Healthwise, Incorporated.   Care instructions adapted under license by [facility]. If you have questions about a medical condition or this instruction, always ask your healthcare  professional. Healthwise, Incorporated disclaims any warranty or liability for your use of this information.           A Healthy Heart: Care Instructions  Overview     Coronary artery disease, also called heart disease, occurs when a substance called plaque builds up in the vessels that supply oxygen-rich blood to your heart muscle. This can narrow the blood vessels and reduce blood flow. A heart attack happens when blood flow is completely blocked. A high-fat diet, smoking, and other factors increase the risk of heart disease.  Your doctor has found that you have a chance of having heart disease. A heart-healthy lifestyle can help keep your heart healthy and prevent heart disease. This lifestyle includes eating healthy, being active, staying at a weight that's healthy for you, and not smoking or using tobacco. It also includes taking medicines as directed, managing other health conditions, and trying to get a healthy amount of sleep.  Follow-up care is a key part of your treatment and  safety. Be sure to make and go to all appointments, and call your doctor if you are having problems. It's also a good idea to know your test results and keep a list of the medicines you take.  How can you care for yourself at home?  Diet   Use less salt when you cook and eat. This helps lower your blood pressure. Taste food before salting. Add only a little salt when you think you need it. With time, your taste buds will adjust to less salt.    Eat fewer snack items, fast foods, canned soups, and other high-salt, high-fat, processed foods.    Read food labels and try to avoid saturated and trans fats. They increase your risk of heart disease by raising cholesterol levels.    Limit the amount of solid fat--butter, margarine, and shortening--you eat. Use olive, peanut, or canola oil when you cook. Bake, broil, and steam foods instead of frying them.    Eat a variety of fruit and vegetables every day. Dark green, deep orange, red, or yellow fruits and vegetables are especially good for you. Examples include spinach, carrots, peaches, and berries.    Foods high in fiber can reduce your cholesterol and provide important vitamins and minerals. High-fiber foods include whole-grain cereals and breads, oatmeal, beans, brown rice, citrus fruits, and apples.    Eat lean proteins. Heart-healthy proteins include seafood, lean meats and poultry, eggs, beans, peas, nuts, seeds, and soy products.    Limit drinks and foods with added sugar. These include candy, desserts, and soda pop.   Heart-healthy lifestyle   If your doctor recommends it, get more exercise. For many people, walking is a good choice. Or you may want to swim, bike, or do other activities. Bit by bit, increase the time you're active every day. Try for at least 30 minutes on most days of the week.    Try to quit or cut back on using tobacco and other nicotine products. This includes smoking and vaping. If you need help quitting, talk to your doctor about  stop-smoking programs and medicines. These can increase your chances of quitting for good. Quitting is one of the most important things you can do to protect your heart. It is never too late to quit. Try to avoid secondhand smoke too.    Stay at a weight that's healthy for you. Talk to your doctor if you need help losing weight.    Try to get 7 to 9  hours of sleep each night.    Limit alcohol to 2 drinks a day for men and 1 drink a day for women. Too much alcohol can cause health problems.    Manage other health problems such as diabetes, high blood pressure, and high cholesterol. If you think you may have a problem with alcohol or drug use, talk to your doctor.   Medicines   Take your medicines exactly as prescribed. Call your doctor if you think you are having a problem with your medicine.    If your doctor recommends aspirin, take the amount directed each day. Make sure you take aspirin and not another kind of pain reliever, such as acetaminophen (Tylenol).   When should you call for help?   Call 911 if you have symptoms of a heart attack. These may include:   Chest pain or pressure, or a strange feeling in the chest.    Sweating.    Shortness of breath.    Pain, pressure, or a strange feeling in the back, neck, jaw, or upper belly or in one or both shoulders or arms.    Lightheadedness or sudden weakness.    A fast or irregular heartbeat.   After you call 911, the operator may tell you to chew 1 adult-strength or 2 to 4 low-dose aspirin. Wait for an ambulance. Do not try to drive yourself.  Watch closely for changes in your health, and be sure to contact your doctor if you have any problems.  Where can you learn more?  Go to [employer] and enter F075 to learn more about "A Healthy Heart: Care Instructions."  Current as of: October 21, 2021  Content Version: 14.1   2006-2024 [employer].   Care instructions adapted under license by [facility]. If you have  questions about a medical condition or this instruction, always ask your healthcare professional. [employer] disclaims any warranty or liability for your use of this information.      Personalized Preventive Plan for Jodi Walls - 10/26/2022  Medicare offers a range of preventive health benefits. Some of the tests and screenings are paid in full while other may be subject to a deductible, co-insurance, and/or copay.    Some of these benefits include a comprehensive review of your medical history including lifestyle, illnesses that may run in your family, and various assessments and screenings as appropriate.    After reviewing your medical record and screening and assessments performed today your provider may have ordered immunizations, labs, imaging, and/or referrals for you.  A list of these orders (if applicable) as well as your Preventive Care list are included within your After Visit Summary for your review.    Other Preventive Recommendations:    A preventive eye exam performed by an eye specialist is recommended every 1-2 years to screen for glaucoma; cataracts, macular degeneration, and other eye disorders.  A preventive dental visit is recommended every 6 months.  Try to get at least 150 minutes of exercise per week or 10,000 steps per day on a pedometer .  Order or download the FREE "Exercise & Physical Activity: Your Everyday Guide" from [employer]. Call 364-066-6804 or search [employer] online.  You need 1200-1500 mg of calcium and 1000-2000 IU of vitamin D per day. It is possible to meet your calcium requirement with diet alone, but a vitamin D supplement is usually necessary to meet this goal.  When exposed to the sun, use a  sunscreen that protects against both UVA and UVB radiation with an SPF of 30 or greater. Reapply every 2 to 3 hours or after sweating, drying off with a towel, or swimming.  Always wear a seat belt when traveling in a  car. Always wear a helmet when riding a bicycle or motorcycle.

## 2022-10-26 NOTE — Progress Notes (Signed)
Subjective:      Patient ID: Jodi Walls is a 82 y.o. female.    Chief Complaint   Patient presents with    6 Month Follow-Up     Hypertension, thyroid, lipids - pt is fasting        Patient presents with:  6 Month Follow-Up: Hypertension, thyroid, lipids - pt is fasting    Here for the check up  Instead of b6 she is using E    She is with out c/o  She is well   Meds same     She is on the prednisone and she continues to see dr Karie Mainland     Irritated corners of mouth for a few months  No drooling and teeth fitting well  No teeth fitting pb   No creams or any thing else     She is seeing me for a preop on August 13    Date of Birth:  21-May-1940    Date of Visit:  10/26/2022     -- Pcn [Penicillins] -- Swelling    --  Face and hands swelled   -- Triamterene     --  Severe stomach pain,SOB,severe cramps throughout             her body   -- Ace Inhibitors -- Other (See Comments)    --  cough   -- Atenolol -- Other (See Comments)    --  Bradycardia (HR 40's)   -- Atorvastatin -- Other (See Comments)    --  Muscle aches   -- Zithromax [Azithromycin] -- Swelling   -- Codeine -- Nausea And Vomiting    Current Outpatient Medications:  VITAMIN E PO, Take by mouth, Disp: , Rfl:   levothyroxine (SYNTHROID) 100 MCG tablet, TAKE 1 TABLET BY MOUTH DAILY, Disp: 90 tablet, Rfl: 1  furosemide (LASIX) 40 MG tablet, Take 1 tablet by mouth daily May take an additional half tablet in the after noon daily  if needed for swelling, Disp: 30 tablet, Rfl: 2  oxyCODONE (ROXICODONE) 5 MG immediate release tablet, Take 1 tablet by mouth in the morning and 1 tablet in the evening., Disp: , Rfl:   methocarbamol (ROBAXIN) 750 MG tablet, Take 1 tablet by mouth 3 times daily, Disp: , Rfl:   gabapentin (NEURONTIN) 300 MG capsule, TAKE 2 CAPSULES BY MOUTH DAILY WITH SUPPER, Disp: 180 capsule, Rfl: 0  vitamin D (CHOLECALCIFEROL) 25 MCG (1000 UT) TABS tablet, Take 1 tablet by mouth daily, Disp: , Rfl:   predniSONE (DELTASONE) 5 MG tablet, TAKE 1 TABLET BY  MOUTH DAILY (Patient taking differently: Take 1 tablet by mouth every other day), Disp: 90 tablet, Rfl: 1  potassium chloride (KLOR-CON) 10 MEQ extended release tablet, Take 1 tablet by mouth 2 times daily, Disp: , Rfl:   melatonin 3 MG TABS tablet, Take 1 tablet by mouth nightly, Disp: , Rfl:   aspirin 81 MG tablet, Take 1 tablet by mouth daily, Disp: , Rfl:   ipratropium (ATROVENT) 0.06 % nasal spray, 2 sprays by Each Nostril route 4 times daily (Patient not taking: Reported on 10/26/2022), Disp: 1 each, Rfl: 0  DULoxetine (CYMBALTA) 60 MG extended release capsule, TAKE 1 CAPSULE BY MOUTH DAILY, Disp: 90 capsule, Rfl: 1  diclofenac sodium (VOLTAREN) 1 % GEL, APPLY 4 GRAMS TO LOWER EXTREMITY JOINTS AND 2 GRAMS TO UPPER EXTREMITY JOINTS FOUR TIMES DAILY AS NEEDED (Patient not taking: Reported on 10/26/2022), Disp: 500 g, Rfl: 1    No  current facility-administered medications for this visit.      ---------------------------               10/26/22                      1049         ---------------------------   BP:          124/80         Site:    Left Upper Arm     Position:     Sitting        Cuff Size:   Large Adult      Pulse:         88           Temp:   99.2 F (37.3 C)   TempSrc:    Temporal        Weight: 79.8 kg (176 lb)    Height:   1.524 m (5')     ---------------------------  Body mass index is 34.37 kg/m.     Wt Readings from Last 3 Encounters:  10/26/22 : 79.8 kg (176 lb)  08/02/22 : 81.2 kg (179 lb)  07/11/22 : 83.9 kg (185 lb)    BP Readings from Last 3 Encounters:  10/26/22 : 124/80  08/02/22 : 126/77  07/11/22 : (!) 145/80            Review of Systems    Objective:   Physical Exam  Constitutional:       General: She is not in acute distress.     Appearance: Normal appearance. She is well-developed. She is not ill-appearing or diaphoretic.   HENT:      Head: Normocephalic and atraumatic.      Mouth/Throat:      Lips: Pink.      Mouth: Mucous membranes are moist.       Comments: Very slight moist redness about the corners of the mouth   Cardiovascular:      Rate and Rhythm: Normal rate and regular rhythm.      Heart sounds: Normal heart sounds. No murmur heard.     No friction rub. No gallop.   Pulmonary:      Effort: Pulmonary effort is normal. No tachypnea, accessory muscle usage or respiratory distress.      Breath sounds: Normal breath sounds. No decreased breath sounds, wheezing, rhonchi or rales.   Lymphadenopathy:      Cervical: No cervical adenopathy.      Upper Body:      Right upper body: No supraclavicular adenopathy.      Left upper body: No supraclavicular adenopathy.   Skin:     General: Skin is warm and dry.      Coloration: Skin is not pale.   Neurological:      Mental Status: She is alert.         Assessment:   Assessment     Diagnosis Orders   1. Essential hypertension, benign  Basic Metabolic Panel      2. Seronegative rheumatoid arthritis (HCC)        3. Stage 3 chronic kidney disease, unspecified whether stage 3a or 3b CKD (HCC)  Basic Metabolic Panel      4. HX: breast cancer  MAM DIGITAL SCREEN W OR WO CAD BILATERAL      5. Breast cancer screening by mammogram  MAM DIGITAL SCREEN W OR WO CAD BILATERAL  Orders Placed This Encounter   Medications    clotrimazole-betamethasone (LOTRISONE) 1-0.05 % cream     Sig: Apply topically 2 times daily. Sparingly for 10 days to the corners of the mouth     Dispense:  15 g     Refill:  0       She did see ent for the smell and taste and did not think she would get this back from the covid   She still see rheumatology and still on steroid      PLAN:   Plan    Do get the ok from dr Katrinka Blazing for the upcoming surgery  Do take over the counter vitamin B6 also called pyridoxine every day   Use the cream twice a day sparingly for the next 10 days   See me back in August as scheduled         [name], MD

## 2022-10-26 NOTE — Telephone Encounter (Signed)
Patient asks to have a diagnostic mammogram order placed in her chart. This was recommended for her by her breast physician.

## 2022-10-26 NOTE — Telephone Encounter (Signed)
Ok     Diagnosis Orders   1. HX: breast cancer  MAM DIGITAL DIAGNOSTIC W OR WO CAD BILATERAL      2. Breast cancer screening by mammogram  MAM DIGITAL DIAGNOSTIC W OR WO CAD BILATERAL

## 2022-10-26 NOTE — Progress Notes (Signed)
She apparently told the nurse she is depressed and the computer thinks she is going to harm self    She was called by Lelon Mast   She feels down at times but not new   She does not want to see any one for this  She denies wanting to harm self

## 2022-10-29 NOTE — Telephone Encounter (Signed)
Becky advised and verbalized understanding.

## 2022-11-30 NOTE — Other (Unsigned)
Subjective  Jodi Walls  Jul 29, 1940  Chief Complaint  Patient presents with   12 Month Follow Up   Cardiac Clearance    Surgeon Alphonsus Sias., MD  Procedure: Right Total Knee Arthroplasty on 12/24/22      Jodi Walls returns to the [facility] clinical [facility] for  follow-up.  Prior office notes, medical records and diagnostic studies were  reviewed in conjunction with the encounter.    She was requesting preoperative cardiac clearance pending right total knee  arthroplasty scheduled for August 26th.    Since I saw her last, she reports being in her usual state of health.    Patient's Medications  Current Medications   ASPIRIN 81 MG TABLET, DELAYED RELEASE (E.C.)    Take 1 Tablet by mouth daily.      Order Dose: 1 Tablet   CHOLECALCIFEROL (VITAMIN D-3) 10 MCG (400 UNIT) TABLET    Take 400 Units by  mouth daily.      Order Dose: 400 Units   CYANOCOBALAMIN 100 MCG TABLET    Take 100 mcg by mouth daily.      Order Dose: 100 mcg   DICYCLOMINE (BENTYL) 20 MG TABLET    Take 20 mg by mouth 3 times a day before  meals.      Order Dose: 20 mg   DULOXETINE (CYMBALTA) 60 MG CAPSULE, DELAYED RELEASE(E.C.)    Take 60 mg by  mouth daily.      Order Dose: 60 mg   FERROUS SULFATE PO    Take by mouth.      Order Dose: --   FUROSEMIDE (LASIX) 40 MG TABLET    Take 40 mg by mouth daily.      Order Dose: 40 mg   GABAPENTIN (NEURONTIN) 300 MG CAPSULE    Take 600 mg by mouth 2 times daily.      Order Dose: 600 mg   HYDROXYCHLOROQUINE (PLAQUENIL) 200 MG TABLET    Take 200 mg by mouth daily.      Order Dose: 200 mg   LEFLUNOMIDE (ARAVA) 20 MG TABLET    Take 20 mg by mouth daily.      Order Dose: 20 mg   LEVOTHYROXINE (SYNTHROID) 75 MCG TABLET    100 mcg. daily      Order Dose: 100 mcg   MELATONIN 3 MG TABLET    Take 3 mg by mouth nightly at bedtime.      Order Dose: 3 mg   OXYCODONE 5 MG PO IMMEDIATE RELEASE TABLET    Take 5 mg by mouth.      Order Dose: 5 mg   POTASSIUM CHLORIDE (KDUR) 10 MEQ PO TABLET    Take 10 mEq by  mouth 2 times  daily. daily      Order Dose: 10 mEq   PREDNISONE (DELTASONE) 5 MG TABLET    Take 5 mg by mouth daily.      Order Dose: 5 mg                Review of Systems      Objective  Vitals:   11/30/22 1132  BP: 130/80  Pulse: 75  Weight: 177 lb (80.3 kg)  Height: 5' (1.524 m)    Body mass index is 34.57 kg/m?Marland Kitchen    Physical Exam  Vitals reviewed.  Constitutional:     General: She is not in acute distress.     Appearance: Normal appearance. She is not diaphoretic.  Eyes:  General: Lids are normal.     Conjunctiva/sclera: Conjunctivae normal.     Pupils: Pupils are equal, round, and reactive to light.  Neck:     Thyroid: No thyroid mass or thyromegaly.     Vascular: Normal carotid pulses. No carotid bruit, hepatojugular reflux or  JVD.     Trachea: Trachea and phonation normal.  Cardiovascular:     Rate and Rhythm: Normal rate and regular rhythm. No extrasystoles are  present.     Chest Wall: PMI is not displaced.     Pulses: Normal pulses.     Heart sounds: Normal heart sounds, S1 normal and S2 normal. No murmur   heard.     No systolic murmur is present.     No diastolic murmur is present.     No friction rub. No gallop. No S3 or S4 sounds.  Pulmonary:     Effort: Pulmonary effort is normal. No respiratory distress.     Breath sounds: Normal breath sounds. No wheezing.  Abdominal:     General: Bowel sounds are normal.     Tenderness: There is no abdominal tenderness.  Musculoskeletal:     General: Normal range of motion.     Cervical back: Full passive range of motion without pain, normal range of  motion and neck supple.  Skin:     General: Skin is warm and dry.     Findings: No abrasion, bruising, burn, ecchymosis or rash.     Nails: There is no clubbing.  Neurological:     Mental Status: She is alert and oriented to person, place, and time.     Deep Tendon Reflexes: Reflexes are normal and symmetric.  Psychiatric:     Judgment: Judgment normal.            Assessment    IMPRESSION:        ICD-10-CM  1.  Agatston coronary artery calcium score less than 100  R93.1 ECG    2. Pre-op exam  Z01.818 ECG    3. LBBB (left bundle branch block)  I44.7    4. Severe obesity (BMI 35.0-39.9) with comorbidity (CMS HCC)  E66.01    5. Nonischemic cardiomyopathy (CMS HCC)  I42.8          PLAN AND RECOMMENDATION:        1.  Left bundle branch block    This has been a stable finding on her surface ECG.  She is otherwise  asymptomatic.  Previous transthoracic echocardiogram showed a structurally  normal heart and preserved left ventricular systolic function.  SPECT MPI  October 2018 showed no reversible perfusion defect concerning for ischemia.    Transthoracic echocardiogram 2021 calculated ejection fraction 46%.    Her ECG today showed sinus rhythm with a heart rate of 77 beats per minute and   a  left bundle branch block pattern unchanged from prior.  (Internal study  interpreted and reported by me.)    2. 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    3. Preoperative cardiac risk assessment    I recommended a limited echocardiogram.      Return to clinic in one year.    --- Please note that I used  voice recognition software to generate the above  note.

## 2022-12-10 NOTE — Other (Unsigned)
[facility]  Pre-Op Knee evaluation    Date: 12/10/2022  Name: Jodi Walls  Age: 82 y.o.  DOB: [birth year]  Referral: Referral Source: Lyda Perone, MD  Occupation/School: Retired  Activities/Sports: None  Dx: Diagnosis: Rt Knee OA    MEDICAL HISTORY AND PERSONAL FACTORS    Surgical History:  Past Surgical History:  Procedure Laterality Date   HX BACK SURGERY   HX CATARACT REMOVAL WITH IMPLANT   HX CHOLECYSTECTOMY   HX COLONOSCOPY   HX DENTAL SURGERY   HX FINGER TRIGGER RELEASE Left   HX HYSTERECTOMY   HX MASTECTOMY   right mastectomy with breast implant   HX NECK SURGERY   HX TOTAL KNEE ARTHROPLASTY Left   PR APPENDECTOMY   PR ARTHRP KNE CONDYLE&PLATU MEDIAL&LAT COMPARTMENTS  06/03/2013   lt    Medications:  Current Outpatient Medications  Medication Sig Dispense Refill   aspirin 81 mg Tablet, Delayed Release (E.C.) Take 1 Tablet by mouth daily.   cholecalciferol (VITAMIN D-3) 10 mcg (400 unit) Tablet Take 400 Units by   mouth  daily.   cyanocobalamin 100 mcg Tablet Take 100 mcg by mouth daily.   FERROUS SULFATE PO Take by mouth every Monday, Wednesday, Friday.   furosemide (LASIX) 40 mg tablet Take 40 mg by mouth daily.   gabapentin (NEURONTIN) 300 mg capsule Take 600 mg by mouth nightly at   bedtime.   levothyroxine (SYNTHROID) 75 mcg tablet 100 mcg. daily   Melatonin 3 mg Tablet Take 3 mg by mouth nightly at bedtime.   oxyCODONE 5 mg PO immediate release tablet Take 5 mg by mouth 2 times daily.   potassium chloride (KDUR) 10 mEq PO tablet Take 10 mEq by mouth 2 times   daily.  daily   predniSONE (DELTASONE) 5 mg tablet Take 5 mg by mouth every morning.    No current facility-administered medications for this visit.      I, Beather Arbour, PT, MPT, have verbally reviewed the patient's current  medications.    Changes in Medication? No.  Medical History :  Past Medical History:  Diagnosis Date   Arthritis   left knee OA   Back problem   s/p surgery   Breast cancer (CMS HCC)    treated with radical mastectomy   Cardiomyopathy (CMS HCC)   Coronary artery disease   CRF (chronic renal failure)   stage 3; monitored; nephrologist - Dr. Mertha Finders   GERD (gastroesophageal reflux disease)   Hiatal hernia   Hx of blood clots 2002   hand/arm post surgery   HX OTHER MEDICAL   NO BP or VENIPUNCTURE RIGHT ARM; s/p mastectomy   Hyperlipidemia   Hypertension   Kidney stones   passed   LBBB (left bundle branch block)   Nausea & vomiting   Neck problem   s/p surgery   Postoperative nausea and vomiting   RA (rheumatoid arthritis) (CMS HCC)   Thyroid disorder   hypo    PT/OT History: No therapy  Diagnostic Tests: Xray  Surgery:   TKA on 12/24/2022  Surgical Precautions: N/A  Onset of Symptoms: Gradual  Mechanism of Injury: Jodi Walls  Chief Complaints: right knee pain  Trauma History/Medical Health Concerns: N/A  Current Living Situation: House single level and Lives with alone  Cognitive deficits: none  Communication barriers: none  Side of body affected: Right  Dominant Hand/Arm: Right  Pain Location: right knee  Pain Intensity:Pain Current: 6/10  9/10  Pain Quality: aching  Pain  Increased with: Standing and Walking  Pain Decreased with: rest  Pain Duration/ Frequency/ Patterns:  Intermittent    Activity Limitations: walking and standing  Participation Restrictions: household tasks and yardwork/gardening  Contributing personal factors/Relevant co-morbidities impacting  treatment/outcome: Advanced age -     Aging negatively affects the recovery  process, secondary to older muscle having a greater susceptibility to  exercise-induced skeletal muscle damage and a slower repair and adaptation  response. and Neuropathy -     Neuropathy may Jodi Walls in inability to feel a   hot  or cold sensation, inability to feel pain, weak or achy muscles, loss of   balance  and decreased fine motor skills. It may also cause muscle cramping or  difficulties breathing, especially during exercise or physical stress.  Prior Level of  Function: Indep    Patient Goals: To be pain free    All blank cells/fields represent areas that have not been assessed on this   date.    * = Pain  (+/- , N/WNL) = Positive/Negative , Normal/Within Normal Limits  (INC/DEC) = Increases/Decreases  1=Mild, 2=Moderate, 3=Severe    Hip and Knee Evaluation      POSTURE ASSESSMENT: WNL   Comments  Standing  Cervical  Thoracic  Lumbar  Pelvis  Hip  Knee / Patella  Ankle / Foot  Sitting  Cervical  Thoracic  Lumbar      STRENGTH AND RANGE OF MOTION:     RIGHT LEFT   AROM PROM STRENGTH AROM PROM STRENGTH  Hip flexion  Hip extension  Hip abduction  Hip adduction  Hip IR  Hip ER  Knee extension  3 4-  Knee flexion  91 4  Abdominals        PALPATION:                        KNEE  Peripatella +  Medial Joint Line +  MCL +  Pes Anserine +  Lateral Joint Line +  LCL +  Popliteus +      INSPECTION:     Right Left Comments  Forward Head  Rounded Shoulders  Kyphosis  Scoliosis  Lordosis  Lateral Shift  Pelvic Tilt  Genus Valgus -  Genu Varus -  Genu Recurvatum -  Patella [place] -  Patella [place] -  Q Angle -      GIRTH: No significant edema     Right Left Comments  Mid Patella  4 Inches Above Patella  4 Inches Below Patella      SPECIAL TESTS:                      KNEE  Valgus Test 0?/30?  Varus Test 0?/30?  Anterior Drawer  Lachman  Posterior Drawer  McMurray +  Thessaly  Patellar Apprehension +  Patellar Tracking +  Homan's Sign      Gait: Antalgic  Cautious/Wide Base/Slow    Functional Tests:  Clinical Presentation:evolving/changing    Based on the sections above, the clinical decision making required for this  evaluation was: Moderate complexity    Episode Count:  1    Minutes/Charges:    PT Evaluation (Moderate) Time Entry: 15                      Therapeutic Exercise Time Entry: 25              Other Charges     Charge  ID Procedure Code Description Qty. Modifiers Charge Entry User   Diagnosis   [id number] [facility] PT VISIT LEVEL 1 NEW COUNTER 1  Mikey Bussing [place],   [place],  [place]    [id number] [facility] PT THERAPEUTIC EXERCISE EA 15 MIN 2 GP Hoffman,  Christian, PT, MPT Primary osteoarthritis of right knee   [id number] [facility] PT EVAL MOD COMPLEX 1 GP Hoffman, Christian, PT, MPT  Primary osteoarthritis of right knee          No data recorded    HOME EXERCISE PROGRAM: See copies in chart    P. T. / O. T. Signature: Beather Arbour, PT, MPT    PT / OT Office Location:  [facility]  [address]  [place] [facility] [address]  Dept: [phone]  Fax: [phone]    Evaluation Note Alert:  Physical Therapy Evaluation

## 2022-12-10 NOTE — Other (Unsigned)
Assessment  [facility]  Physical Therapy PLAN OF CARE - "THIS PLAN MUST BE APPROVED AND SIGNED FOR  MEDICARE CERTIFICATION"    Patient Name: Jodi Walls  Date of Birth: October 09, 1940  Diagnosis:  Diagnosis: Rt Knee OA  Functional Dx: Functional Diagnosis: Pain and weakness  Date of Surgery:  Referring Source:   Referral Source: Lyda Perone, MD  Rehabilitation Potential / Reasons: Excellent potential / Motivated      ASSESSMENT:    Pleasant 82 yo female presents with right knee OA and will have THA this   month.  Pt has a RW.  She was educated on steps as well as a HEP for which she was   given  a copy to repeat at home.  Pt demonstrated understanding.      Patient presents with the following limitations:  Pain: 9/10  Limited range of motion in Right knee extension and flexion  Weakness in Right knee extension and flexion  Patient presents with gait deviation of slowed and antalgic with dec step   length  Lacks Independence in a home exercise program  Limited activities of daily living    Outcome Measure: KOS (ability) Raw Score: 0, Percentage: 0 %  TUG   25.57  30 Sec sit/stand Repetitions: 5    Patient Centered Goals / Discharge Criteria:  All of the following goals to be met by D/C in 8 visits        Reduce pain to a subjective rating of None/10.  Improve knee extension and flexion range of motion in Right knee(s) by 0-120?   to  allow patient to return to all desired activities  Improve strength in Right knee(s) to a rating of 5/5 for extension and flexion  to allow patient to return to normal functional activity.  Resume routine activities of daily living without complaints.  Patient will ambulate without significant gait deviation  without assistive  device.  Patient will demonstrate independence in a home exercise program.  STS 11x in 30 sec to improve mobility  Dec TUG test to under 15 sec to dec fall risk    Treatment options as follows:  Therapeutic Exercise:   ROM, Flexibility / Stretching, Aerobic Conditioning,  Isometrics, Eccentrics, Isotonics, Closed Chain Strengthening, Muscle Energy,  Treadmill Training, and PREs  Therapeutic Activities:  Closed Chain Functional Training, Lifting Tasks, and  Work / International aid/development worker Re-ed.:  Location manager, Best boy, and PNF  Manual Therapy: Joint Mobilization, Soft Tissue Mobilization, Manual Traction,  Myofascial Release, Trigger Point Release, Massage, Edema Management, Dry  Needling, Cupping, and Kinesio Taping  Modalities:  CP, HP, Electric Stimulation, and TENS        Other:  (Specific treatment techniques with parameters, sets/reps, and times are   listed  daily on the patient flow sheet.)    Reviewed goals and plans with patient.  The patient agreed with these.  Yes     2 Times/Week.  For 4 weeks  Total expected duration: 8 visits      Walker, [place], MPT  12/10/2022    MD signature: _______________________________    Date/Time: _______________________________    (For Medicare Certification)    PT / OT Office Location:  [facility] [place]  [address]  [address]  [place] [address]  Dept: [phone]  Dept Fax: [phone]

## 2022-12-11 ENCOUNTER — Encounter: Payer: MEDICARE | Attending: Family Medicine | Primary: Family Medicine

## 2022-12-20 NOTE — Other (Unsigned)
Room:         blank  Gender:       Female                   Technician:   JE  DOB:          Sep 26, 1940               Requested By: Jodi Chatters A.  Order Number: [id number]                Reading MD:   Jodi Chatters, MD                                   Measurements  Intervals                              Axis  Rate:         77                       P:            70  PR:           166                      QRS:          55  QRSD:         145                      T:            84  QT:           449  QTc:          509                             Interpretive Statements  Sinus rhythm  Left bundle branch block  No change since previous ECG performed  Electronically Signed On 11-30-2022 11:37:36 EDT by Jodi Chatters, MD    12/03/2022 Echocardiogram:  Study Conclusions    - Left ventricle: Systolic function is mildly reduced. The estimated    ejection fraction is 46%, by biplane method of disks. There is diffuse    hypokinesis.  - Ventricular septum: Septal motion is paradoxical septal motion consistent    with a conduction abnormality or paced rhythm.  - Tricuspid valve: The valve is structurally normal. There is mild    regurgitation.  - Inferior vena cava: The IVC is normal-sized. Respirophasic diameter    changes are in the normal range (> 50%), consistent with normal central    venous pressure.  - No change from prior echocardiogram.    02/22/2017.[place] Coronary Artery Calcium Score less than 100.    ASSESSMENT    ICD-10-CM  1. Primary osteoarthritis of right knee  M17.11 PREOP CONSULTATION TO EVALUATE  FOR RISK FACTORS        REVISED CARDIAC RISK INDEX   No = 0  Yes = 1  High risk surgery (Orthopedic, intra-abdominal, intrathoracic, suprainguinal  vascular)?   1  History of Ischemic Heart Disease (MI, positive GXT, current CP, use of  nitrates, EKG with Q)? 0  History of CHF? 1  History of CVA (prior TIA or stroke)? 0  [facility]  PRE-OPERATIVE CLEARANCE EXAMINATION  12/20/2022      PATIENT INFORMATION   Name:  DOB:  MRN:  PCP:   Jodi Walls  1941/01/02  [id number]  Jodi Candy, MD    PROCEDURE INFORMATION  Date:  12/24/2022  Procedure:  Right Total Knee Arthroplasty  Location:  [facility]  Reason for Procedure:  Primary osteoarthritis of right knee  Preoperative physical exam/Medical clearance for surgery requested by:  Dr.  Alphonsus Walls      HISTORY OF PRESENT ILLNESS  Jodi Walls is a 82 y.o. female with a history of Primary osteoarthritis of   right  knee  who presents today for a preoperative physical examination and medical  clearance for surgery. Patient is requiring clearance based on current medical  problems to ensure chronic diseases are stable and no acute illness is   present.  Denies fever, chills, or current illness.      SURGICAL ISSUES / FUNCTIONAL STATUS  Question:    Patient Response:  Chest pain? No  Shortness of breath? Yes, with exertion  Can you walk 1/2 mile or more?  No, due to osteoarthritis  Can you go up more than 2 flights of stairs? No, due to osteoarthritis  Sleep apnea? No  Bleeding disorder?  History of nose bleeds?  Excessive bleeding following a surgical procedure? No  No  No  Personal or Family history of Blood Clots? Yes. Patient reports blood clot in  left upper arm following gallbladder surgery in 2003.  Personal history of Anesthesia Complications? No  Family history of Anesthesia Complications? No  Loose, capped, or false teeth? No  Skin infections? No  Recent Steroid Use? Yes, Prednisone 5 mg every other day  Recent Infection Exposure? No  Recent exposure to COVID? No      MEDICATIONS  * Herbals or Over the Counter Medicines:  Vitamin D3, Cyanocobalamin (B12),  Ferrous Sulfate, Potassium Chloride, Melatonin.    Medications List  Medication Sig   aspirin 81 mg Tablet, Delayed Release (E.C.) Take 1 Tablet by mouth daily.    cholecalciferol (VITAMIN D-3) 10 mcg (400 unit) Tablet Take 400 Units by   mouth  daily.   cyanocobalamin 100 mcg Tablet Take 100 mcg by mouth daily.   FERROUS SULFATE PO Take by mouth every Monday, Wednesday, Friday.   furosemide (LASIX) 40 mg tablet Take 40 mg by mouth daily.   gabapentin (NEURONTIN) 300 mg capsule Take 600 mg by mouth nightly at   bedtime.   levothyroxine (SYNTHROID) 75 mcg tablet 100 mcg. daily   Melatonin 3 mg Tablet Take 3 mg by mouth nightly at bedtime.   oxyCODONE 5 mg PO immediate release tablet Take 5 mg by mouth 2 times daily.   potassium chloride (KDUR) 10 mEq PO tablet Take 10 mEq by mouth 2 times   daily.  daily   predniSONE (DELTASONE) 5 mg tablet Take 5 mg by mouth every morning.    No current facility-administered medications for this encounter.    Facility-Administered Medications Ordered in Other Encounters  Medication   (START ON 12/24/2022) ROPivacaine (PF) (NAROPIN) 5 mg/mL (0.5 %) 150 mg,  BUPivacaine (PF) (MARCAINE) 0.75 % 150 mg, EPINEPHrine (ADRENALIN) 0.15 mg,  dexAMETHasone (DECADRON) 8 mg, cloNIDine 100 mcg in sodium chloride 100 mcg   (START ON 12/24/2022) tranexamic acid 1000 mg in sodium chloride 0.9% - total  volume 50 ml      Immunization History  [facility]  PRE-OPERATIVE CLEARANCE EXAMINATION  12/20/2022      PATIENT INFORMATION   Name:  DOB:  MRN:  PCP:   Jodi Walls  1941/01/02  [id number]  Jodi Candy, MD    PROCEDURE INFORMATION  Date:  12/24/2022  Procedure:  Right Total Knee Arthroplasty  Location:  [facility]  Reason for Procedure:  Primary osteoarthritis of right knee  Preoperative physical exam/Medical clearance for surgery requested by:  Dr.  Alphonsus Walls      HISTORY OF PRESENT ILLNESS  Jodi Walls is a 82 y.o. female with a history of Primary osteoarthritis of   right  knee  who presents today for a preoperative physical examination and medical  clearance for surgery. Patient is requiring clearance based on current medical  problems to ensure chronic diseases are stable and no acute illness is   present.  Denies fever, chills, or current illness.      SURGICAL ISSUES / FUNCTIONAL STATUS  Question:    Patient Response:  Chest pain? No  Shortness of breath? Yes, with exertion  Can you walk 1/2 mile or more?  No, due to osteoarthritis  Can you go up more than 2 flights of stairs? No, due to osteoarthritis  Sleep apnea? No  Bleeding disorder?  History of nose bleeds?  Excessive bleeding following a surgical procedure? No  No  No  Personal or Family history of Blood Clots? Yes. Patient reports blood clot in  left upper arm following gallbladder surgery in 2003.  Personal history of Anesthesia Complications? No  Family history of Anesthesia Complications? No  Loose, capped, or false teeth? No  Skin infections? No  Recent Steroid Use? Yes, Prednisone 5 mg every other day  Recent Infection Exposure? No  Recent exposure to COVID? No      MEDICATIONS  * Herbals or Over the Counter Medicines:  Vitamin D3, Cyanocobalamin (B12),  Ferrous Sulfate, Potassium Chloride, Melatonin.    Medications List  Medication Sig   aspirin 81 mg Tablet, Delayed Release (E.C.) Take 1 Tablet by mouth daily.    cholecalciferol (VITAMIN D-3) 10 mcg (400 unit) Tablet Take 400 Units by   mouth  daily.   cyanocobalamin 100 mcg Tablet Take 100 mcg by mouth daily.   FERROUS SULFATE PO Take by mouth every Monday, Wednesday, Friday.   furosemide (LASIX) 40 mg tablet Take 40 mg by mouth daily.   gabapentin (NEURONTIN) 300 mg capsule Take 600 mg by mouth nightly at   bedtime.   levothyroxine (SYNTHROID) 75 mcg tablet 100 mcg. daily   Melatonin 3 mg Tablet Take 3 mg by mouth nightly at bedtime.   oxyCODONE 5 mg PO immediate release tablet Take 5 mg by mouth 2 times daily.   potassium chloride (KDUR) 10 mEq PO tablet Take 10 mEq by mouth 2 times   daily.  daily   predniSONE (DELTASONE) 5 mg tablet Take 5 mg by mouth every morning.    No current facility-administered medications for this encounter.    Facility-Administered Medications Ordered in Other Encounters  Medication   (START ON 12/24/2022) ROPivacaine (PF) (NAROPIN) 5 mg/mL (0.5 %) 150 mg,  BUPivacaine (PF) (MARCAINE) 0.75 % 150 mg, EPINEPHrine (ADRENALIN) 0.15 mg,  dexAMETHasone (DECADRON) 8 mg, cloNIDine 100 mcg in sodium chloride 100 mcg   (START ON 12/24/2022) tranexamic acid 1000 mg in sodium chloride 0.9% - total  volume 50 ml      Immunization History  Room:         blank  Gender:       Female                   Technician:   JE  DOB:          Sep 26, 1940               Requested By: Jodi Chatters A.  Order Number: [id number]                Reading MD:   Jodi Chatters, MD                                   Measurements  Intervals                              Axis  Rate:         77                       P:            70  PR:           166                      QRS:          55  QRSD:         145                      T:            84  QT:           449  QTc:          509                             Interpretive Statements  Sinus rhythm  Left bundle branch block  No change since previous ECG performed  Electronically Signed On 11-30-2022 11:37:36 EDT by Jodi Chatters, MD    12/03/2022 Echocardiogram:  Study Conclusions    - Left ventricle: Systolic function is mildly reduced. The estimated    ejection fraction is 46%, by biplane method of disks. There is diffuse    hypokinesis.  - Ventricular septum: Septal motion is paradoxical septal motion consistent    with a conduction abnormality or paced rhythm.  - Tricuspid valve: The valve is structurally normal. There is mild    regurgitation.  - Inferior vena cava: The IVC is normal-sized. Respirophasic diameter    changes are in the normal range (> 50%), consistent with normal central    venous pressure.  - No change from prior echocardiogram.    02/22/2017.[place] Coronary Artery Calcium Score less than 100.    ASSESSMENT    ICD-10-CM  1. Primary osteoarthritis of right knee  M17.11 PREOP CONSULTATION TO EVALUATE  FOR RISK FACTORS        REVISED CARDIAC RISK INDEX   No = 0  Yes = 1  High risk surgery (Orthopedic, intra-abdominal, intrathoracic, suprainguinal  vascular)?   1  History of Ischemic Heart Disease (MI, positive GXT, current CP, use of  nitrates, EKG with Q)? 0  History of CHF? 1  History of CVA (prior TIA or stroke)? 0  Room:         blank  Gender:       Female                   Technician:   JE  DOB:          Sep 26, 1940               Requested By: Jodi Chatters A.  Order Number: [id number]                Reading MD:   Jodi Chatters, MD                                   Measurements  Intervals                              Axis  Rate:         77                       P:            70  PR:           166                      QRS:          55  QRSD:         145                      T:            84  QT:           449  QTc:          509                             Interpretive Statements  Sinus rhythm  Left bundle branch block  No change since previous ECG performed  Electronically Signed On 11-30-2022 11:37:36 EDT by Jodi Chatters, MD    12/03/2022 Echocardiogram:  Study Conclusions    - Left ventricle: Systolic function is mildly reduced. The estimated    ejection fraction is 46%, by biplane method of disks. There is diffuse    hypokinesis.  - Ventricular septum: Septal motion is paradoxical septal motion consistent    with a conduction abnormality or paced rhythm.  - Tricuspid valve: The valve is structurally normal. There is mild    regurgitation.  - Inferior vena cava: The IVC is normal-sized. Respirophasic diameter    changes are in the normal range (> 50%), consistent with normal central    venous pressure.  - No change from prior echocardiogram.    02/22/2017.[place] Coronary Artery Calcium Score less than 100.    ASSESSMENT    ICD-10-CM  1. Primary osteoarthritis of right knee  M17.11 PREOP CONSULTATION TO EVALUATE  FOR RISK FACTORS        REVISED CARDIAC RISK INDEX   No = 0  Yes = 1  High risk surgery (Orthopedic, intra-abdominal, intrathoracic, suprainguinal  vascular)?   1  History of Ischemic Heart Disease (MI, positive GXT, current CP, use of  nitrates, EKG with Q)? 0  History of CHF? 1  History of CVA (prior TIA or stroke)? 0  [facility]  PRE-OPERATIVE CLEARANCE EXAMINATION  12/20/2022      PATIENT INFORMATION   Name:  DOB:  MRN:  PCP:   Jodi Walls  1941/01/02  [id number]  Jodi Candy, MD    PROCEDURE INFORMATION  Date:  12/24/2022  Procedure:  Right Total Knee Arthroplasty  Location:  [facility]  Reason for Procedure:  Primary osteoarthritis of right knee  Preoperative physical exam/Medical clearance for surgery requested by:  Dr.  Alphonsus Walls      HISTORY OF PRESENT ILLNESS  Jodi Walls is a 82 y.o. female with a history of Primary osteoarthritis of   right  knee  who presents today for a preoperative physical examination and medical  clearance for surgery. Patient is requiring clearance based on current medical  problems to ensure chronic diseases are stable and no acute illness is   present.  Denies fever, chills, or current illness.      SURGICAL ISSUES / FUNCTIONAL STATUS  Question:    Patient Response:  Chest pain? No  Shortness of breath? Yes, with exertion  Can you walk 1/2 mile or more?  No, due to osteoarthritis  Can you go up more than 2 flights of stairs? No, due to osteoarthritis  Sleep apnea? No  Bleeding disorder?  History of nose bleeds?  Excessive bleeding following a surgical procedure? No  No  No  Personal or Family history of Blood Clots? Yes. Patient reports blood clot in  left upper arm following gallbladder surgery in 2003.  Personal history of Anesthesia Complications? No  Family history of Anesthesia Complications? No  Loose, capped, or false teeth? No  Skin infections? No  Recent Steroid Use? Yes, Prednisone 5 mg every other day  Recent Infection Exposure? No  Recent exposure to COVID? No      MEDICATIONS  * Herbals or Over the Counter Medicines:  Vitamin D3, Cyanocobalamin (B12),  Ferrous Sulfate, Potassium Chloride, Melatonin.    Medications List  Medication Sig   aspirin 81 mg Tablet, Delayed Release (E.C.) Take 1 Tablet by mouth daily.    cholecalciferol (VITAMIN D-3) 10 mcg (400 unit) Tablet Take 400 Units by   mouth  daily.   cyanocobalamin 100 mcg Tablet Take 100 mcg by mouth daily.   FERROUS SULFATE PO Take by mouth every Monday, Wednesday, Friday.   furosemide (LASIX) 40 mg tablet Take 40 mg by mouth daily.   gabapentin (NEURONTIN) 300 mg capsule Take 600 mg by mouth nightly at   bedtime.   levothyroxine (SYNTHROID) 75 mcg tablet 100 mcg. daily   Melatonin 3 mg Tablet Take 3 mg by mouth nightly at bedtime.   oxyCODONE 5 mg PO immediate release tablet Take 5 mg by mouth 2 times daily.   potassium chloride (KDUR) 10 mEq PO tablet Take 10 mEq by mouth 2 times   daily.  daily   predniSONE (DELTASONE) 5 mg tablet Take 5 mg by mouth every morning.    No current facility-administered medications for this encounter.    Facility-Administered Medications Ordered in Other Encounters  Medication   (START ON 12/24/2022) ROPivacaine (PF) (NAROPIN) 5 mg/mL (0.5 %) 150 mg,  BUPivacaine (PF) (MARCAINE) 0.75 % 150 mg, EPINEPHrine (ADRENALIN) 0.15 mg,  dexAMETHasone (DECADRON) 8 mg, cloNIDine 100 mcg in sodium chloride 100 mcg   (START ON 12/24/2022) tranexamic acid 1000 mg in sodium chloride 0.9% - total  volume 50 ml      Immunization History

## 2022-12-20 NOTE — H&P (Signed)
[facility]  PRE-OPERATIVE CLEARANCE EXAMINATION  12/20/2022      PATIENT INFORMATION    Name:  DOB:  MRN:  PCP:    Jodi Walls  04-Nov-1940  [id number]  Jodi Candy, MD     PROCEDURE INFORMATION   Date:  12/24/2022   Procedure:  Right Total Knee Arthroplasty    Location:  [facility]   Reason for Procedure:  Primary osteoarthritis of right knee    Preoperative physical exam/Medical clearance for surgery requested by:  Dr. Alphonsus Walls       HISTORY OF PRESENT ILLNESS   Jodi Walls is a 82 y.o. female with a history of Primary osteoarthritis of right knee  who presents today for a preoperative physical examination and medical clearance for surgery. Patient is requiring clearance based on current medical problems to ensure chronic diseases are stable and no acute illness is present.  Denies fever, chills, or current illness.      SURGICAL ISSUES / FUNCTIONAL STATUS                       Question:    Patient Response:   Chest pain? No   Shortness of breath? Yes, with exertion   Can you walk 1/2 mile or more?  No, due to osteoarthritis   Can you go up more than 2 flights of stairs? No, due to osteoarthritis   Sleep apnea? No   Bleeding disorder?  History of nose bleeds?  Excessive bleeding following a surgical procedure? No  No  No   Personal or Family history of Blood Clots? Yes. Patient reports blood clot in left upper arm following gallbladder surgery in 2003.   Personal history of Anesthesia Complications? No   Family history of Anesthesia Complications? No   Loose, capped, or false teeth? No   Skin infections? No   Recent Steroid Use? Yes, Prednisone 5 mg every other day   Recent Infection Exposure? No   Recent exposure to COVID? No       MEDICATIONS  * Herbals or Over the Counter Medicines:  Vitamin D3, Cyanocobalamin (B12), Ferrous Sulfate, Potassium Chloride, Melatonin.      Medications List   Medication Sig   . aspirin 81 mg Tablet, Delayed Release (E.C.)  Take 1 Tablet by mouth daily.   . cholecalciferol (VITAMIN D-3) 10 mcg (400 unit) Tablet Take 400 Units by mouth daily.   . cyanocobalamin 100 mcg Tablet Take 100 mcg by mouth daily.   Marland Kitchen FERROUS SULFATE PO Take by mouth every Monday, Wednesday, Friday.   . furosemide (LASIX) 40 mg tablet Take 40 mg by mouth daily.   Marland Kitchen gabapentin (NEURONTIN) 300 mg capsule Take 600 mg by mouth nightly at bedtime.   Marland Kitchen levothyroxine (SYNTHROID) 75 mcg tablet 100 mcg. daily   . Melatonin 3 mg Tablet Take 3 mg by mouth nightly at bedtime.   Marland Kitchen oxyCODONE 5 mg PO immediate release tablet Take 5 mg by mouth 2 times daily.   . potassium chloride (KDUR) 10 mEq PO tablet Take 10 mEq by mouth 2 times daily. daily   . predniSONE (DELTASONE) 5 mg tablet Take 5 mg by mouth every morning.     No current facility-administered medications for this encounter.     Facility-Administered Medications Ordered in Other Encounters   Medication   . [START ON 12/24/2022] ROPivacaine (PF) (NAROPIN) 5 mg/mL (0.5 %) 150 mg, BUPivacaine (PF) (MARCAINE)  0.75 % 150 mg, EPINEPHrine (ADRENALIN) 0.15 mg, dexAMETHasone (DECADRON) 8 mg, cloNIDine 100 mcg in sodium chloride 100 mcg   . [START ON 12/24/2022] tranexamic acid 1000 mg in sodium chloride 0.9% - total volume 50 ml       Immunization History   Administered Date(s) Administered   . Covid19-Sars-Cov-2 12+YRS Bivalent Booster Vaccine (PFIZER) 02/08/2021   . Covid19-Sars-cov-2 Vaccine 12+YRS Old (PFIZER Purple) 05/30/2019, 06/20/2019, 03/15/2020   . Pfizer COVID 12+yrs 23-24 02/15/2022        ALLERGIES  Allergies   Allergen Reactions   . Ace Inhibitors      cough   . Atenolol      Bradycardia (HR 40's)   . Codeine Nausea And Vomiting   . Lipitor [Atorvastatin]      Muscle aches       PAST MEDICAL HISTORY    Past Medical History:   Diagnosis Date   . Arthritis     left knee OA   . Back problem     s/p surgery   . Breast cancer (CMS HCC)     treated with radical mastectomy   . Cardiomyopathy (CMS HCC)    . Coronary  artery disease    . CRF (chronic renal failure)     stage 3; monitored; nephrologist - Dr. Mertha Walls   . GERD (gastroesophageal reflux disease)    . Hiatal hernia    . Hx of blood clots 2002    hand/arm post surgery   . HX OTHER MEDICAL     NO BP or VENIPUNCTURE RIGHT ARM; s/p mastectomy   . Hyperlipidemia    . Hypertension    . Kidney stones     passed   . LBBB (left bundle branch block)    . Nausea & vomiting    . Neck problem     s/p surgery   . Postoperative nausea and vomiting    . RA (rheumatoid arthritis) (CMS HCC)    . Thyroid disorder     hypo       FAMILY HISTORY    Family History   Problem Relation Name Age of Onset   . Heart Problems Father          bypass surgery   . Anesthesia Complications Neg Hx          SOCIAL HISTORY    Social History     Tobacco Use   . Smoking status: Never     Passive exposure: Never   . Smokeless tobacco: Never   Vaping Use   . Vaping status: Never Used   Substance Use Topics   . Alcohol use: No   . Drug use: No        SURGICAL HISTORY    Past Surgical History:   Procedure Laterality Date   . HX BACK SURGERY     . HX CATARACT REMOVAL WITH IMPLANT     . HX CHOLECYSTECTOMY     . HX COLONOSCOPY     . HX DENTAL SURGERY     . HX FINGER TRIGGER RELEASE Left    . HX HYSTERECTOMY     . HX MASTECTOMY      right mastectomy with breast implant   . HX NECK SURGERY     . HX TOTAL KNEE ARTHROPLASTY Left    . PR APPENDECTOMY     . PR ARTHRP KNE CONDYLE&PLATU MEDIAL&LAT COMPARTMENTS  06/03/2013    lt        Patient Active  Problem List    Diagnosis Date Noted   . Severe obesity (BMI 35.0-39.9) with comorbidity (CMS HCC) 11/30/2022   . Primary osteoarthritis of right knee 11/06/2022   . Nonischemic cardiomyopathy (CMS HCC) 10/24/2021   . Hyperlipidemia    . Other heart failure (CMS HCC) 05/03/2020   . Agatston coronary artery calcium score less than 100 02/22/2017   . Chest discomfort 02/22/2017   . Shortness of breath 02/21/2016   . Dyspnea on exertion 11/17/2013   . Status post total knee  replacement 06/03/2013   . Knee pain 05/15/2013   . Primary localized osteoarthrosis, pelvic region and thigh 01/08/2013   . Primary localized osteoarthrosis, lower leg 01/08/2013   . Preoperative clearance 03/06/2012   . LBBB (left bundle branch block) 02/15/2012   . Abnormal ECG 02/15/2012   . Fatigue 02/15/2012   . HTN (hypertension) 02/15/2012       REVIEW OF SYSTEMS  Review of Systems   Constitutional:  Negative for chills, fever and unexpected weight change.   HENT:  Positive for rhinorrhea. Negative for congestion, dental problem, mouth sores, nosebleeds, sinus pressure, sinus pain and sore throat.         Reports Full dentures.  States loss of taste and smell after COVID-19 infection in 2021.   Eyes:  Negative for pain, discharge, redness and itching.   Respiratory:  Positive for shortness of breath (Reports short of breath with exertion). Negative for apnea, cough and chest tightness.    Cardiovascular:  Positive for leg swelling. Negative for chest pain and palpitations.   Gastrointestinal:  Positive for constipation (Reports constipation from Iron tablets). Negative for abdominal distention, abdominal pain, blood in stool, diarrhea, nausea and vomiting.        Reports hiatal hernia.  Reports severe post-op nausea and vomiting.   Endocrine: Positive for cold intolerance. Negative for polydipsia, polyphagia and polyuria.   Genitourinary:  Positive for frequency. Negative for decreased urine volume, difficulty urinating, dysuria, flank pain, hematuria, urgency and vaginal bleeding.        Reports CKD stage 3,  Reports a call was placed to her Nephrologist Dr. Mertha Walls regarding recent increase in Creatinine.    Musculoskeletal:  Positive for arthralgias (Rhuematoid Arthritis, sees RhuematologistDr. Tilman Neat), gait problem (Uses cane) and neck pain. Neck stiffness: Reports cervical spine fusion C3 and C4 in 2009.  Skin:  Positive for wound (Reports right forearm skin tear.). Negative for rash.    Allergic/Immunologic: Negative for environmental allergies and food allergies.   Neurological:  Positive for weakness (Reports generalized weakness 2/2 arthritis) and numbness (Reports neuropathy in hands and feet.). Negative for dizziness, seizures, syncope, light-headedness and headaches.   Hematological:  Does not bruise/bleed easily.        Reports personal history of blood clots in left arm in 2003 following Gallbladder surgery.   Psychiatric/Behavioral:  Negative for agitation, behavioral problems, self-injury and suicidal ideas. The patient is not nervous/anxious.         PHYSICAL EXAM   Today's Vitals  Vitals:    12/20/22 1031   BP: 142/88   Pulse: 78   Temp: 98.3 F (36.8 C)   TempSrc: Oral   Weight: 174 lb 3.2 oz (79 kg)   Height: 5' (1.524 m)       Body Mass Index  Estimated body mass index is 34.02 kg/m as calculated from the following:    Height as of this encounter: 5' (1.524 m).    Weight as of this  encounter: 174 lb 3.2 oz (79 kg).     Physical Exam  Constitutional:       Appearance: Normal appearance. She is not ill-appearing.   HENT:      Head: Normocephalic and atraumatic.      Nose: Nose normal. No congestion or rhinorrhea.      Mouth/Throat:      Mouth: Mucous membranes are moist.      Pharynx: Oropharynx is clear.   Eyes:      Extraocular Movements: Extraocular movements intact.      Pupils: Pupils are equal, round, and reactive to light.   Neck:      Vascular: No carotid bruit.   Cardiovascular:      Rate and Rhythm: Normal rate and regular rhythm.      Pulses: Normal pulses.      Heart sounds: Normal heart sounds. No murmur heard.  Pulmonary:      Effort: Pulmonary effort is normal.      Breath sounds: Normal breath sounds.   Abdominal:      General: Bowel sounds are normal. There is no distension.      Palpations: Abdomen is soft.      Tenderness: There is no abdominal tenderness.   Musculoskeletal:         General: Normal range of motion.      Cervical back: Normal range of motion.       Right lower leg: No edema.      Left lower leg: No edema.   Lymphadenopathy:      Cervical: No cervical adenopathy.   Skin:     General: Skin is warm and dry.      Capillary Refill: Capillary refill takes less than 2 seconds.   Neurological:      Mental Status: She is alert and oriented to person, place, and time.      Motor: No weakness.      Coordination: Coordination normal.      Gait: Gait abnormal.      Comments: Uses cane to support ambulation.   Psychiatric:         Mood and Affect: Mood normal.         Behavior: Behavior normal.         Thought Content: Thought content normal.         Judgment: Judgment normal.        PREDICTORS OF INTUBATION DIFFICULTY:  Morbid obesity? no   Anatomically abnormal facies? no   Prominent incisors? no   Receding mandible? no   Short, thick neck? no   Neck range of motion: Abnormal.  Hx of C-spine fusion. Limited ROM in all planes.   Mallampati score: III (soft palate, base of uvula visible)   Dentition: Edentulous, wears upper and lower dentures       RELEVANT LABORATORY VALUES / PRE-OP WORKUP  Hospital Encounter on 12/10/22 (from the past 1008 hour(s))   CBC (COMPLETE BLOOD COUNT)   Result Value Ref Range    WBC 8.37 3.80 - 10.80 10*3/uL    RBC 4.63 3.80 - 5.10 10*6/uL    Hemoglobin 13.4 11.7 - 15.5 g/dL    Hematocrit Blood 16.1 35.0 - 46.0 %    MCV 90.5 80.0 - 100.0 fL    MCH 28.9 27.0 - 33.0 pg    MCHC 32.0 30.0 - 36.0 g/dL    RDW 09.6 04.5 - 40.9 %    Platelets 257 140 - 400 10*3/uL    MPV 11.0  9.0 - 13.0 fL   BASIC METABOLIC PANEL (BMP=EP1)   Result Value Ref Range    Sodium 140 135 - 146 mmol/L    Potassium 3.4 (LOW) 3.5 - 5.1 mmol/L    Chloride 101 98 - 110 mmol/L    CO2 25 22 - 29 mmol/L    Anion Gap 14 (HIGH) 5 - 13 mmol/L    BUN 21 7 - 25 mg/dL    Creatinine 1.30 (HIGH) 0.50 - 1.20 mg/dL    Glucose 865 (HIGH) 71 - 99 mg/dL    eGFR CKD-EPI 7846 23 See Note    Calcium 9.5 8.5 - 10.5 mg/dL    BUN/Creatinine Ratio 10    HGB, A1C (GLYCOHEMOGLOBIN)   Result Value Ref Range     Hgb A1C 5.4 4.0 - 5.6 %    Estimated Average Glucose 108 68 - 114 mg/dL   ALBUMIN SERUM/PLASMA   Result Value Ref Range    Albumin 4.0 3.5 - 5.0 g/dL     EKG:  Results for orders placed or performed in visit on 11/30/22   ECG    Narrative                                 [facility]                                                                                                                                                                    Test Date:    2022-11-30 11:36:06  Pat Name:     [facility]            Department:   [id number]  Patient ID:   [id number]                 Room:         blank  Gender:       Female                   Technician:   JE  DOB:          January 09, 1941               Requested By: Abbey Chatters A.  Order Number: [id number]                Reading MD:   Abbey Chatters, MD                                   Measurements  Intervals  Axis            Rate:         77                       P:            70  PR:           166                      QRS:          55  QRSD:         145                      T:            84  QT:           449                                      QTc:          509                                                                 Interpretive Statements  Sinus rhythm  Left bundle branch block  No change since previous ECG performed   Electronically Signed On 11-30-2022 11:37:36 EDT by Abbey Chatters, MD     12/03/2022 Echocardiogram:   Study Conclusions    - Left ventricle: Systolic function is mildly reduced. The estimated    ejection fraction is 46%, by biplane method of disks. There is diffuse    hypokinesis.  - Ventricular septum: Septal motion is paradoxical septal motion consistent    with a conduction abnormality or paced rhythm.  - Tricuspid valve: The valve is structurally normal. There is mild    regurgitation.  - Inferior vena cava: The IVC is normal-sized. Respirophasic diameter    changes are in the normal range (> 50%), consistent  with normal central    venous pressure.  - No change from prior echocardiogram.    02/22/2017.[place]aston Coronary Artery Calcium Score less than 100.     ASSESSMENT     ICD-10-CM    1. Primary osteoarthritis of right knee  M17.11 PREOP CONSULTATION TO EVALUATE FOR RISK FACTORS          REVISED CARDIAC RISK INDEX   No = 0  Yes = 1   High risk surgery (Orthopedic, intra-abdominal, intrathoracic, suprainguinal vascular)?   1   History of Ischemic Heart Disease (MI, positive GXT, current CP, use of nitrates, EKG with Q)? 0   History of CHF? 1   History of CVA (prior TIA or stroke)? 0   Diabetes requiring Insulin? 0   Renal Insufficiency (Pre op creatinine >2)? 1   ( 0 - 1 = Low Risk,        >2 = Elevated Risk ) Score:   3       METS  < 4  METS unable to walk > 2 blocks on level ground without stopping due to symptoms = poor functional capacity     (.  eating, dressing, toileting, walking indoors, light housework) POOR   > 4  METS 4-6 = climbing > 1 flight of stairs without stopping, walking up hill > 1-2 blocks, heavy housework. MODERATE  to  EXCELLENT    7-10 = Moderate - Vigorous exercise     >10 = Vigorous exercise    * Performance of any one of the activities would qualify the patient, not the ability to do all.    Score:   <4       RISK ASSESSMENT  Surgery Specific Risk:   - Intermediate: (Carotid surgery, Intraperitoneal, Intrathoracic, Prostate, Major abdominal, Major orthopedic, Major ENT).   Clinical Predictors:  - Intermediate: (Mild angina, Prior MI by history or q waves, Compensated CHF, Diabetes).   Risk Reduction Strategies: - Patient may proceed with planned surgery as scheduled.              PLAN  1.      Primary osteoarthritis of right knee.  Presents for Right Knee Total Arthroplasty.    2. Surgeon and surgical team to reassess patient for any changes in cardiac status using RCRI on day of planned surgery.     3. Cardiac Risk:  Nonischemic cardiomyopathy and LBBB.  Managed on Aspirin.    Denies chest pain,  edema or orthopnea.  Reports DOE.  Most recent cardiac testing includes 12/03/2022 Echocardiogram: Ejection Fraction 46% with diffuse hypokinesis.  Functional capacity is <4 METS.  Followed by Cardiology Dr. Abbey Chatters.  Cardiac Clearance obtained on 12/10/2022.    4. Nonischemic cardiomyopathy:  Managed with Lasix (and potassium supplement).      5.      HTN:  Managed with Lasix (and potassium supplement).  Blood pressure today was 142/88. Instructed  to hold Lasix and Potassium the morning of surgery.     6.      GERD: Taking Pantoprazole. Will continue perioperatively.     7. Hypothyroidism:  Taking Synthroid; will continue perioperatively.      8. Change in medication administration before surgery: Hold NSAIDs,(Aspirin) and dietary supplements for 7 days prior to procedure.        DISCUSSION OF ADVANCE DIRECTIVES    ACP Quality Activity <redacted file path>   Last Advance Care Discussion       None             CARE TEAM INFORMATION   Patient Care Team:  Jodi Candy, MD as PCP - General (Family Medicine)  Madelyn Brunner., MD (Cardiology)  Jodi Walls., MD (Orthopedic Surgery)  Fawn Kirk., MD (Orthopedic Surgery)  Gertie Baron., MD (Cardiology)       Electronically signed by:   Joycelyn Man, NP   12/20/2022 at 4:01 PM    Please note that this consultation serves to evaluate the patient's medical conditions, with specific attention to the heart, lungs, and infection status. The appropriateness of the operation versus alternative therapies was not evaluated. Neither were risks and benefits specific to the operation discussed

## 2022-12-20 NOTE — Other (Unsigned)
Room:         blank  Gender:       Female                   Technician:   JE  DOB:          Sep 26, 1940               Requested By: Abbey Chatters A.  Order Number: [id number]                Reading Jodi Walls:   Abbey Chatters, Jodi Walls                                   Measurements  Intervals                              Axis  Rate:         77                       P:            70  PR:           166                      QRS:          55  QRSD:         145                      T:            84  QT:           449  QTc:          509                             Interpretive Statements  Sinus rhythm  Left bundle branch block  No change since previous ECG performed  Electronically Signed On 11-30-2022 11:37:36 EDT by Abbey Chatters, Jodi Walls    12/03/2022 Echocardiogram:  Study Conclusions    - Left ventricle: Systolic function is mildly reduced. The estimated    ejection fraction is 46%, by biplane method of disks. There is diffuse    hypokinesis.  - Ventricular septum: Septal motion is paradoxical septal motion consistent    with a conduction abnormality or paced rhythm.  - Tricuspid valve: The valve is structurally normal. There is mild    regurgitation.  - Inferior vena cava: The IVC is normal-sized. Respirophasic diameter    changes are in the normal range (> 50%), consistent with normal central    venous pressure.  - No change from prior echocardiogram.    02/22/2017.[place] Coronary Artery Calcium Score less than 100.    ASSESSMENT    ICD-10-CM  1. Primary osteoarthritis of right knee  M17.11 PREOP CONSULTATION TO EVALUATE  FOR RISK FACTORS        REVISED CARDIAC RISK INDEX   No = 0  Yes = 1  High risk surgery (Orthopedic, intra-abdominal, intrathoracic, suprainguinal  vascular)?   1  History of Ischemic Heart Disease (MI, positive GXT, current CP, use of  nitrates, EKG with Q)? 0  History of CHF? 1  History of CVA (prior TIA or stroke)? 0  Room:         blank  Gender:       Female                   Technician:   JE  DOB:          Sep 26, 1940               Requested By: Abbey Chatters A.  Order Number: [id number]                Reading Jodi Walls:   Abbey Chatters, Jodi Walls                                   Measurements  Intervals                              Axis  Rate:         77                       P:            70  PR:           166                      QRS:          55  QRSD:         145                      T:            84  QT:           449  QTc:          509                             Interpretive Statements  Sinus rhythm  Left bundle branch block  No change since previous ECG performed  Electronically Signed On 11-30-2022 11:37:36 EDT by Abbey Chatters, Jodi Walls    12/03/2022 Echocardiogram:  Study Conclusions    - Left ventricle: Systolic function is mildly reduced. The estimated    ejection fraction is 46%, by biplane method of disks. There is diffuse    hypokinesis.  - Ventricular septum: Septal motion is paradoxical septal motion consistent    with a conduction abnormality or paced rhythm.  - Tricuspid valve: The valve is structurally normal. There is mild    regurgitation.  - Inferior vena cava: The IVC is normal-sized. Respirophasic diameter    changes are in the normal range (> 50%), consistent with normal central    venous pressure.  - No change from prior echocardiogram.    02/22/2017.[place] Coronary Artery Calcium Score less than 100.    ASSESSMENT    ICD-10-CM  1. Primary osteoarthritis of right knee  M17.11 PREOP CONSULTATION TO EVALUATE  FOR RISK FACTORS        REVISED CARDIAC RISK INDEX   No = 0  Yes = 1  High risk surgery (Orthopedic, intra-abdominal, intrathoracic, suprainguinal  vascular)?   1  History of Ischemic Heart Disease (MI, positive GXT, current CP, use of  nitrates, EKG with Q)? 0  History of CHF? 1  History of CVA (prior TIA or stroke)? 0  Room:         blank  Gender:       Female                   Technician:   JE  DOB:          Sep 26, 1940               Requested By: Abbey Chatters A.  Order Number: [id number]                Reading Jodi Walls:   Abbey Chatters, Jodi Walls                                   Measurements  Intervals                              Axis  Rate:         77                       P:            70  PR:           166                      QRS:          55  QRSD:         145                      T:            84  QT:           449  QTc:          509                             Interpretive Statements  Sinus rhythm  Left bundle branch block  No change since previous ECG performed  Electronically Signed On 11-30-2022 11:37:36 EDT by Abbey Chatters, Jodi Walls    12/03/2022 Echocardiogram:  Study Conclusions    - Left ventricle: Systolic function is mildly reduced. The estimated    ejection fraction is 46%, by biplane method of disks. There is diffuse    hypokinesis.  - Ventricular septum: Septal motion is paradoxical septal motion consistent    with a conduction abnormality or paced rhythm.  - Tricuspid valve: The valve is structurally normal. There is mild    regurgitation.  - Inferior vena cava: The IVC is normal-sized. Respirophasic diameter    changes are in the normal range (> 50%), consistent with normal central    venous pressure.  - No change from prior echocardiogram.    02/22/2017.[place] Coronary Artery Calcium Score less than 100.    ASSESSMENT    ICD-10-CM  1. Primary osteoarthritis of right knee  M17.11 PREOP CONSULTATION TO EVALUATE  FOR RISK FACTORS        REVISED CARDIAC RISK INDEX   No = 0  Yes = 1  High risk surgery (Orthopedic, intra-abdominal, intrathoracic, suprainguinal  vascular)?   1  History of Ischemic Heart Disease (MI, positive GXT, current CP, use of  nitrates, EKG with Q)? 0  History of CHF? 1  History of CVA (prior TIA or stroke)? 0  Room:         blank  Gender:       Female                   Technician:   JE  DOB:          Sep 26, 1940               Requested By: Abbey Chatters A.  Order Number: [id number]                Reading Jodi Walls:   Abbey Chatters, Jodi Walls                                   Measurements  Intervals                              Axis  Rate:         77                       P:            70  PR:           166                      QRS:          55  QRSD:         145                      T:            84  QT:           449  QTc:          509                             Interpretive Statements  Sinus rhythm  Left bundle branch block  No change since previous ECG performed  Electronically Signed On 11-30-2022 11:37:36 EDT by Abbey Chatters, Jodi Walls    12/03/2022 Echocardiogram:  Study Conclusions    - Left ventricle: Systolic function is mildly reduced. The estimated    ejection fraction is 46%, by biplane method of disks. There is diffuse    hypokinesis.  - Ventricular septum: Septal motion is paradoxical septal motion consistent    with a conduction abnormality or paced rhythm.  - Tricuspid valve: The valve is structurally normal. There is mild    regurgitation.  - Inferior vena cava: The IVC is normal-sized. Respirophasic diameter    changes are in the normal range (> 50%), consistent with normal central    venous pressure.  - No change from prior echocardiogram.    02/22/2017.[place] Coronary Artery Calcium Score less than 100.    ASSESSMENT    ICD-10-CM  1. Primary osteoarthritis of right knee  M17.11 PREOP CONSULTATION TO EVALUATE  FOR RISK FACTORS        REVISED CARDIAC RISK INDEX   No = 0  Yes = 1  High risk surgery (Orthopedic, intra-abdominal, intrathoracic, suprainguinal  vascular)?   1  History of Ischemic Heart Disease (MI, positive GXT, current CP, use of  nitrates, EKG with Q)? 0  History of CHF? 1  History of CVA (prior TIA or stroke)? 0  [facility]  PRE-OPERATIVE CLEARANCE EXAMINATION  12/20/2022      PATIENT INFORMATION   Name:  DOB:  MRN:  PCP:   Jodi Walls  1941/01/02  [id number]  Jodi Candy, Jodi Walls    PROCEDURE INFORMATION  Date:  12/24/2022  Procedure:  Right Total Knee Arthroplasty  Location:  [facility]  Reason for Procedure:  Primary osteoarthritis of right knee  Preoperative physical exam/Medical clearance for surgery requested by:  Dr.  Alphonsus Sias      HISTORY OF PRESENT ILLNESS  Jodi Walls is a 82 y.o. female with a history of Primary osteoarthritis of   right  knee  who presents today for a preoperative physical examination and medical  clearance for surgery. Patient is requiring clearance based on current medical  problems to ensure chronic diseases are stable and no acute illness is   present.  Denies fever, chills, or current illness.      SURGICAL ISSUES / FUNCTIONAL STATUS  Question:    Patient Response:  Chest pain? No  Shortness of breath? Yes, with exertion  Can you walk 1/2 mile or more?  No, due to osteoarthritis  Can you go up more than 2 flights of stairs? No, due to osteoarthritis  Sleep apnea? No  Bleeding disorder?  History of nose bleeds?  Excessive bleeding following a surgical procedure? No  No  No  Personal or Family history of Blood Clots? Yes. Patient reports blood clot in  left upper arm following gallbladder surgery in 2003.  Personal history of Anesthesia Complications? No  Family history of Anesthesia Complications? No  Loose, capped, or false teeth? No  Skin infections? No  Recent Steroid Use? Yes, Prednisone 5 mg every other day  Recent Infection Exposure? No  Recent exposure to COVID? No      MEDICATIONS  * Herbals or Over the Counter Medicines:  Vitamin D3, Cyanocobalamin (B12),  Ferrous Sulfate, Potassium Chloride, Melatonin.    Medications List  Medication Sig   aspirin 81 mg Tablet, Delayed Release (E.C.) Take 1 Tablet by mouth daily.    cholecalciferol (VITAMIN D-3) 10 mcg (400 unit) Tablet Take 400 Units by   mouth  daily.   cyanocobalamin 100 mcg Tablet Take 100 mcg by mouth daily.   FERROUS SULFATE PO Take by mouth every Monday, Wednesday, Friday.   furosemide (LASIX) 40 mg tablet Take 40 mg by mouth daily.   gabapentin (NEURONTIN) 300 mg capsule Take 600 mg by mouth nightly at   bedtime.   levothyroxine (SYNTHROID) 75 mcg tablet 100 mcg. daily   Melatonin 3 mg Tablet Take 3 mg by mouth nightly at bedtime.   oxyCODONE 5 mg PO immediate release tablet Take 5 mg by mouth 2 times daily.   potassium chloride (KDUR) 10 mEq PO tablet Take 10 mEq by mouth 2 times   daily.  daily   predniSONE (DELTASONE) 5 mg tablet Take 5 mg by mouth every morning.    No current facility-administered medications for this encounter.    Facility-Administered Medications Ordered in Other Encounters  Medication   (START ON 12/24/2022) ROPivacaine (PF) (NAROPIN) 5 mg/mL (0.5 %) 150 mg,  BUPivacaine (PF) (MARCAINE) 0.75 % 150 mg, EPINEPHrine (ADRENALIN) 0.15 mg,  dexAMETHasone (DECADRON) 8 mg, cloNIDine 100 mcg in sodium chloride 100 mcg   (START ON 12/24/2022) tranexamic acid 1000 mg in sodium chloride 0.9% - total  volume 50 ml      Immunization History  [facility]  PRE-OPERATIVE CLEARANCE EXAMINATION  12/20/2022      PATIENT INFORMATION   Name:  DOB:  MRN:  PCP:   Jodi Walls  1941/01/02  [id number]  Jodi Candy, Jodi Walls    PROCEDURE INFORMATION  Date:  12/24/2022  Procedure:  Right Total Knee Arthroplasty  Location:  [facility]  Reason for Procedure:  Primary osteoarthritis of right knee  Preoperative physical exam/Medical clearance for surgery requested by:  Dr.  Alphonsus Sias      HISTORY OF PRESENT ILLNESS  Jodi Walls is a 82 y.o. female with a history of Primary osteoarthritis of   right  knee  who presents today for a preoperative physical examination and medical  clearance for surgery. Patient is requiring clearance based on current medical  problems to ensure chronic diseases are stable and no acute illness is   present.  Denies fever, chills, or current illness.      SURGICAL ISSUES / FUNCTIONAL STATUS  Question:    Patient Response:  Chest pain? No  Shortness of breath? Yes, with exertion  Can you walk 1/2 mile or more?  No, due to osteoarthritis  Can you go up more than 2 flights of stairs? No, due to osteoarthritis  Sleep apnea? No  Bleeding disorder?  History of nose bleeds?  Excessive bleeding following a surgical procedure? No  No  No  Personal or Family history of Blood Clots? Yes. Patient reports blood clot in  left upper arm following gallbladder surgery in 2003.  Personal history of Anesthesia Complications? No  Family history of Anesthesia Complications? No  Loose, capped, or false teeth? No  Skin infections? No  Recent Steroid Use? Yes, Prednisone 5 mg every other day  Recent Infection Exposure? No  Recent exposure to COVID? No      MEDICATIONS  * Herbals or Over the Counter Medicines:  Vitamin D3, Cyanocobalamin (B12),  Ferrous Sulfate, Potassium Chloride, Melatonin.    Medications List  Medication Sig   aspirin 81 mg Tablet, Delayed Release (E.C.) Take 1 Tablet by mouth daily.    cholecalciferol (VITAMIN D-3) 10 mcg (400 unit) Tablet Take 400 Units by   mouth  daily.   cyanocobalamin 100 mcg Tablet Take 100 mcg by mouth daily.   FERROUS SULFATE PO Take by mouth every Monday, Wednesday, Friday.   furosemide (LASIX) 40 mg tablet Take 40 mg by mouth daily.   gabapentin (NEURONTIN) 300 mg capsule Take 600 mg by mouth nightly at   bedtime.   levothyroxine (SYNTHROID) 75 mcg tablet 100 mcg. daily   Melatonin 3 mg Tablet Take 3 mg by mouth nightly at bedtime.   oxyCODONE 5 mg PO immediate release tablet Take 5 mg by mouth 2 times daily.   potassium chloride (KDUR) 10 mEq PO tablet Take 10 mEq by mouth 2 times   daily.  daily   predniSONE (DELTASONE) 5 mg tablet Take 5 mg by mouth every morning.    No current facility-administered medications for this encounter.    Facility-Administered Medications Ordered in Other Encounters  Medication   (START ON 12/24/2022) ROPivacaine (PF) (NAROPIN) 5 mg/mL (0.5 %) 150 mg,  BUPivacaine (PF) (MARCAINE) 0.75 % 150 mg, EPINEPHrine (ADRENALIN) 0.15 mg,  dexAMETHasone (DECADRON) 8 mg, cloNIDine 100 mcg in sodium chloride 100 mcg   (START ON 12/24/2022) tranexamic acid 1000 mg in sodium chloride 0.9% - total  volume 50 ml      Immunization History

## 2022-12-24 NOTE — Other (Unsigned)
Subjective:    C/c status post RTKA/hypothyroid/CAD/GERD    Patient is a 82 y.o.  female presents with status post R TKA. Onset of   symptoms  was gradual several months ago with gradually worsening course since that   time.  The pain is located in the post op area of the R knee. Patient describes the  pain as continuous and rated as moderate and localized. It has been associated  with activity. Denies having any chest pain, sob, fever, chills, nausea,  vomiting, abdominal pian, hematuria, hematemesis, hemoptysis.  Symptoms are  aggravated by activity. Symptoms improve with rest. Past history includes   Breast  CA with radical mastectomy/CAD/CKD3/HLD/renal stones/HLD/HTN/LBB/RA on chronic  prednisone and see below.  Previous studies include see below.      Past Medical History:  Diagnosis Date   Arthritis   left knee OA   Back problem   s/p surgery   Breast cancer (CMS HCC)   treated with radical mastectomy   Cardiomyopathy (CMS HCC)   Coronary artery disease   CRF (chronic renal failure)   stage 3; monitored; nephrologist - Dr. Mertha Finders   GERD (gastroesophageal reflux disease)   Hiatal hernia   Hx of blood clots 2002   hand/arm post surgery   HX OTHER MEDICAL   NO BP or VENIPUNCTURE RIGHT ARM; s/p mastectomy   Hyperlipidemia   Hypertension   Kidney stones   passed   LBBB (left bundle branch block)   Nausea & vomiting   Neck problem   s/p surgery   Postoperative nausea and vomiting   RA (rheumatoid arthritis) (CMS HCC)   Thyroid disorder   hypo    Past Surgical History:  Procedure Laterality Date   HX BACK SURGERY   HX CATARACT REMOVAL WITH IMPLANT   HX CHOLECYSTECTOMY   HX COLONOSCOPY   HX DENTAL SURGERY   HX FINGER TRIGGER RELEASE Left   HX HYSTERECTOMY   HX MASTECTOMY   right mastectomy with breast implant   HX NECK SURGERY   HX TOTAL KNEE ARTHROPLASTY Left   PR APPENDECTOMY   PR ARTHRP KNE CONDYLE&PLATU MEDIAL&LAT COMPARTMENTS  06/03/2013   lt    Current Facility-Administered Medications  Medication Dose Route  Frequency Provider Last Rate Last Admin   acetaminophen (TYLENOL) tablet 1,000 mg  1,000 mg Oral Q6H Washnock, Eric J.,  PA   (START ON 12/25/2022) aspirin chewable tablet 81 mg  81 mg Oral BID Alla Feeling., PA   baclofen (LIORESAL) tablet 10 mg  10 mg Oral TID PRN Alla Feeling., PA   ceFAZolin (Ancef) syringe for injection 1 g 10 mL  1,000 mg Intravenous Q8H  Washnock, Eric J., PA   diphenhydrAMINE (BENADRYL) capsule 25 mg  25 mg Oral Q6H PRN Alla Feeling.,  PA   (START ON 12/25/2022) furosemide (LASIX) tablet 40 mg  40 mg Oral Daily  Elvina Sidle., MD   gabapentin (NEURONTIN) capsule 600 mg  600 mg Oral QHS Alla Feeling., PA   (START ON 12/25/2022) levothyroxine (SYNTHROID) tablet 100 mcg  100 mcg Oral  Daily Alla Feeling., PA   morphine 4 mg/mL injection 2-4 mg  2-4 mg Intravenous Q2H PRN Alla Feeling., PA   ondansetron (PF) (ZOFRAN) injection 4 mg  4 mg Intravenous Q6H PRN Alla Feeling., PA   ondansetron (ZOFRAN) tablet 4 mg  4 mg Oral Q6H PRN Alla Feeling., PA   oxyCODONE (ROXICODONE) immediate release tablet  5 mg  5 mg Oral Q4H PRN  Alla Feeling., PA   oxyCODONE (ROXICODONE) immediate release tablet 5-10 mg  5-10 mg Oral Q4H PRN  Alla Feeling., PA   potassium chloride (KDUR) tablet 10 mEq  10 mEq Oral BID Alla Feeling.,   PA   (START ON 12/25/2022) predniSONE (DELTASONE) tablet 5 mg  5 mg Oral QAM  Washnock, Eric J., PA   saline lock 0.9 % flush 3-10 mL  3-10 mL Intravenous PRN Alla Feeling.,   PA   sodium chloride 0.9% IV solution  100 mL/hr Intravenous Continuous Alla Feeling., PA 100 mL/hr at 12/24/22 1110 100 mL/hr at 12/24/22 1110   sodium chloride infusion 0.9% flush bag  30 mL Intravenous PRN Alla Feeling., PA   tamsulosin (FLOMAX) capsule 0.4 mg  0.4 mg Oral Daily PRN Alla Feeling.,   PA    Allergies  Allergen Reactions   Ace Inhibitors    cough   Atenolol    Bradycardia (HR 40's)   Codeine Nausea And Vomiting   Lipitor  (Atorvastatin)    Muscle aches    Social History    Tobacco Use   Smoking status: Never    Passive exposure: Never   Smokeless tobacco: Never  Substance Use Topics   Alcohol use: No    Family History  Problem Relation Name Age of Onset   Heart Problems Father       bypass surgery   Anesthesia Complications Neg Hx      Review of Systems  A complete review of systems was performed including:    General ROS:no night sweats, no fever, no chills  Ophthalmic ROS:  no eye pain, no visual changes  ENT ROS no sinus congestion  Hematological and Lymphatic ROS: no swollen lymph nodes  Respiratory ROS:  no shortness of breath  Cardiovascular ROS:no chest pain, no palpitations  Gastrointestinal ROS: no nausea/vomiting , no abdominal pain,  no melena  Genito-Urinary ROS:no hematuria, no dysuria  Musculoskeletal ROS:no joint swelling  Neurological ROS:no numbness or tingling of fingers or toes  Dermatological ROS:no rash  Endocrine ROS: no unexpected weight changes.      All other systems reviewed are negative/except as posted per ROS and HPI      Objective:        Intake/Output Summary (Last 24 hours) at 12/24/2022 1220  Last data filed at 12/24/2022 1001  Gross per 24 hour  Intake 20 ml  Output --  Net 20 ml      BP 119/60   Pulse 74   Temp (!) 97.4 ?F (36.3 ?C) (Oral)   Resp 16   Ht 5'  (1.524 m)   Wt 172 lb 6.4 oz (78.2 kg)   SpO2 99%   BMI 33.67 kg/m?  General appearance: alert, cooperative  Head: Normocephalic, without obvious abnormality, atraumatic  Eyes: conjunctivae/corneas clear. PERRL  Throat: abnormal findings: dentition: poor  Neck: supple, symmetrical, trachea midline, no adenopathy and thyroid: not  enlarged  Lungs: clear to auscultation bilaterally  Heart: regular rate and rhythm, S1, S2  no gallop or rub  Abdomen: soft, non-tender. Bowel sounds normal. No masses,  no organomegaly  Extremities: post op R knee pain and edema  Pulses: 2+ and symmetric  Skin: Skin color, texture, turgor normal. No rashes or  lesions  Lymph nodes: Cervical, supraclavicular, and axillary nodes normal.  Neurologic: Cranial nerves appear to be intact 2-12, DTR 2/4. No sensory  deficit.      ECG:    Data Review:No results for input(s): "WBC", "HGB", "HCT", "MCV", "PLT" in the  last 72 hours.  No results for input(s): "NA", "K", "CL", "CO2", "PHOS", "BUN", "CREATININE"   in  the last 72 hours.    Invalid input(s): "CA"  Lab Results  Component Value Date   GLU 121 (HIGH) 12/10/2022   GLU 90 08/15/2018   GLU 95 02/21/2016    Lab Results  Component Value Date   ALB 4.0 12/10/2022    No results found for: "INR", "PROTIME"  No results found for: "IRON", "TIBC", "FERRITIN"  Lab Results  Component Value Date   MG 1.9 08/15/2018    No results found for: "FT3", "FREET4", "TSH"  No results found for: "PHENYTOIN", "PHENOBARB", "VALPROATE", "CBMZ"  No results found for: "VITAMINB12"  No results found for: "FOLATE"  Lab Results  Component Value Date   HGBA1C 5.4 12/10/2022    No results found for: "CKTOTAL", "CKMB", "CKMBINDEX", "TROPONINI"  No results for input(s): "GLU", "POCGLU", "POCGMD" in the last 72 hours.  Radiology review:  No orders to display          Assessment and Plan:    Status post R TKA.  RA on chronic steroids/chronic steroid use.  HTN  HLD  Hypothyroidism  Chronic diastolic CHF  HLD  HTN    Plan.  Hypothyroidism - will continue synthroid as per current schedule and modify  accordingly.  Continue on lasix with hold criteria.  Continue on home prednisone 5 mg daily - monitor for hypotension.  Continue on IV fluids for now.  Continue on neurontin 600 mg at bedtime for chronic pain mgmt.  Ongoing K supplementation and monitoring.                      Signed:  Elvina Sidle, MD  12/24/2022  12:20 PM

## 2022-12-24 NOTE — Other (Unsigned)
PRE-OP TEACHING GIVEN TO PATIENT AND FAMILY FOR A NERVE BLOCK AND PAIN  MANAGEMENT POST-OP.  PATIENT EDUCATED ABOUT FALL RISK AND REINFORCED TO ALWAYS  CALL FOR HELP PRIOR TO AMBULATION.  AMBULATION PLAN [place]  PRE OPERATIVE BLOCK Single right adductor canal nerve block

## 2022-12-24 NOTE — Other (Unsigned)
A comprehensive assessment has been completed and any variations will be  documented in the doc flow sheet.

## 2022-12-24 NOTE — Other (Unsigned)
Care complete. Patient switched to hourly vitals.

## 2022-12-24 NOTE — Other (Unsigned)
Inpatient Physical Therapy Consult Note    Recommendations  Assessment  PT Assessment Complete?: Yes  Deficits: Decreased LE ROM;Decreased LE Strength;Impaired activity  tolerance;Impaired transfers;Impaired gait;Impaired bed mobility;Impaired  balance  Rehab Potential: Good  Patient Strengths: Motivation;Family support;Prior level of function  Barriers to Goal Achievement: Decreased strength;Decreased ROM;Decreased  balance;Medical Status;Decreased functional activity tolerance;Pain    Plan  Treatment Interventions: Bed Mobility;Art gallery manager;Therapeutic  exercises;Endurance Training;Balance;Patient/Family Training;Equipment  Eval/Education;Gait Training;Neuromuscular reeducation  PT Frequency: Per Protocol  PT Follow-up Required?: Yes    PT Recommendations  PT Recommendations: Short-term skilled PT  PT Equipment Recommended: Defer to next level of care      Assessment  Precautions  Activity Level:  (PT eval and treat)  Weight Bearing Status: Weight bearing as tolerated  Joint Precautions: None  Cardiac: None  Other Precautions: R TKA    Home Living  Type of Home: House (modular home)  Home Layout: One Level  Entrance: Stairs  # of stairs to enter: 2  Railings Present at Entrance: left,right  Total # of Floors: 1 Floors  Home Equipment: Musician;Wheelchair-manual;Dentures    Prior Function  Level of Independence: Independent with ADLs;Independent with functional  mobility without AD;Independent Homemaking  Lives With: Alone  Receives Help From: Family  Vocational: Retired  Leisure: Hobbies - Yes (comment)  Additional Comments: Patient requesting to discharge to SNF for rehabilitation  d/t living alone.    Bed Mobility  Supine to Sit: Min assist to left  Sit to Supine: Unable to assess (Pt sitting in chair at the end of session   with  call light within reach and chair alarm activated)  Sit to Stand: Min assist;With Assistive Device (specify) (RW)  Bed to Chair: Min Assist;With  Assistive Device (specify) (RW, via ambulation)    Engineer, drilling assistance needed: Not applicable, patient is ambulatory    Gait  Pattern: Narrow BOS;Decreased Cadence;Decreased heel strike L;Decreased heel  strike R;Decreased step length L;Decreased step length R;Step-through  Gait Assistance: Interior and spatial designer (ft): 15 Feet  Additional Comments: Pt ambulates with good stability and strength, distance  limited due to fatigue    Balance  Sitting-Static: Fair+ - Maintains balance with minimal challenges from all  directions  Sitting-Dynamic: Fair+ - Maintains balance through minimal excursions of   active  truck movement  Standing-Static: Fair - Able to stand unsupported without balance loss or UE  support  Standing-Dynamic: Fair- - Maintains balance through minimal excursions of   active  trunk movement with CGA    Coordination  Coordination: No gross deficits noted  Coordination comments: Functional    Activity Tolerance  Functional Activity Tolerance: Endurance does limit participation in activity  Endurance does limit participation: Rest breaks with gait;Rest breaks with  exercise;Medical status limits tolerance    Vision and Hearing Screening  Hearing: WFL    Cognition  Overall Cognitive Status: Does not interfere with functional mobility or ADL  Arousal/Alertness: Alert  Behavior: Cooperative;Pleasant;Appropriate  Attention Span: Appears intact  Orientation: Person;Place;Time;Situation  Following Commands: Follows one step commands w/out difficulty  Initiation/Sequencing/Organization: Appears intact  Safety Judgement: Good awareness of safety precautions  Problem Solving: Able to problem solve independently    Light Touch  RLE: No apparent deficit  LLE: No apparent deficit        Proprioception  LUE: No apparent deficit  RLE: No apparent deficit    Perception  Inattention/Neglect: Appears intact  Motor Planning and Motor Control:  WDL for functional  mobility            RLE Assessment  RLE Assessment: Abnormal  Strength - RLE  R Hip Flexion: 3/5 Part moves through complete ROM against gravity (no added  resistance)  R Knee Flexion: 3+/5 Complete ROM Against Gravity/Slight Resistance  R Knee Extension: 3+/5 Complete ROM Against Gravity/Slight Resistance  R Ankle Dorsiflexion: 3+/5 Complete ROM Against Gravity/Slight Resistance  R Ankle Plantarflexion: 3+/5 Part Moves Through Complete ROM Against  Gravity/Slight Resistance    LLE Assessment  LLE Assessment: Abnormal  Strength - LLE  L Hip Flexion: 3/5 Part moves through complete ROM against gravity (no added  resistance)  L Knee Flexion: 3+/5 Complete ROM Against Gravity/Slight Resistance  L Knee Extension: 3+/5 Complete ROM Against Gravity/Slight Resistance  L Ankle Dorsiflexion: 3+/5 Complete ROM Against Gravity/Slight Resistance  L Ankle Plantarflexion: 3+/5 Complete ROM Against Gravity/Slight Resistance    6 CLICKS MOBILITY  Difficulty Turning from your back to your side while in bed without using  bedrails?: A little  Stand up from a chair using your arms (e.g., wheelchair, or bedside chair)?: A  little  Moving from lying on back to sitting on the side of a flat bed without using  bedrails?: A little  Moving to and from a bed to a chair (including a wheelchair): A little  Help to walk in hospital room: A little  Climbing 3-5 steps with a railing?: A little  Mobility raw score : 18      Recommendations  Assessment  PT Assessment Complete?: Yes  Deficits: Decreased LE ROM;Decreased LE Strength;Impaired activity  tolerance;Impaired transfers;Impaired gait;Impaired bed mobility;Impaired  balance  Rehab Potential: Good  Patient Strengths: Motivation;Family support;Prior level of function  Barriers to Goal Achievement: Decreased strength;Decreased ROM;Decreased  balance;Medical Status;Decreased functional activity tolerance;Pain    Short term goals to be met by 12/31/22  Pt will perform supine <> sit, supervision  Pt  will perform sit <>stand supervision  Pt will ambulate 150 ft with LRAD, SBA  Pt will ascend/descend 2 steps with 2 HR, SBA.  Pt will perform 10-15 BLE exercises with minimal cueing.      Plan  Treatment Interventions: Bed Mobility;Art gallery manager;Therapeutic  exercises;Endurance Training;Balance;Patient/Family Training;Equipment  Eval/Education;Gait Training;Neuromuscular reeducation  PT Frequency: Per Protocol  PT Follow-up Required?: Yes    Physical Therapy Evaluation Charge Reference    History - refer to precautions, prior level of function, home set up and  cognition sections above  No personal factors and/or comorbidities Low  1-2 personal factors and/or comorbidities Moderate  3 or more personal factors and/or comorbidities High x  Examination of body systems - refer to bed mobility, gait, wheelchair   mobility,  balance, coordination, vision/hearing and LE assessment (strength, light touch  and proprioception) sections above  Of body system(s) using standardized tests and measures addressing 1-2   elements  from any of the following: body structures and functions, activity   limitations,  and/or participation restrictions Low  Of body system(s) using standardized tests and measures addressing 3 or more  elements from any of the following: body structures and functions, activity  limitations, and/or participation restrictions Moderate x  Of body system(s) using standardized tests and measures addressing 4 or more  elements from any of the following: body structures and functions, activity  limitations, and/or participation restrictions High  Clinical Presentation - refer to assessment, plan and recommendations sections  above  With stable and/or uncomplicated characteristics Low x  Evolving clinical  presentation with changing characteristics Moderate  Unstable and unpredictable characteristics High  Clinical Decision making - refer to assessment, plan and recommendations  sections above  Low  complexity using standardized patient assessment instrument and/or  measurable assessment of functional outcome Low x  Moderate complexity using standardized patient assessment instrument and/or  measurable assessment of functional outcome Moderate  High complexity using standardized patient assessment instrument and/or  measurable assessment of functional outcome High        PT Recommendations  PT Recommendations: Short-term skilled PT  PT Equipment Recommended: Defer to next level of care      Therapist: Tillman Abide, PT,DPT  Date: 12/24/2022

## 2022-12-24 NOTE — Other (Unsigned)
H&P reviewed. The patient was examined and there are no changes to the H&P.

## 2022-12-24 NOTE — Other (Unsigned)
Post-Anesthesia Follow-Up Note    SHEILAGH ROSSLER has met the following Anesthesia criteria for discharge:                   Vitals:  Blood Pressure:  BP Readings from Last 1 Encounters:  12/24/22 115/63      Heart Rate:  Pulse Readings from Last 1 Encounters:  12/24/22 74      Respirations:  Resp Readings from Last 1 Encounters:  12/24/22 17      O2 Sat:  SpO2 Readings from Last 1 Encounters:  12/24/22 98%      Temp:  Temp Readings from Last 1 Encounters:  12/24/22 (!) 97 ?F (36.1 ?C) (Core)      -  Cardiorespiratory status has returned to baseline, and/or is acceptable.  -  Pt responding verbally and following commands to acceptable level.  -  Temperature and hydration status acceptable.  -  Pain and nausea appropriately managed.  -  No anesthesia complications apparent at this time.   Dr. Tiburcio Pea at bedside. Okay to go to floor.

## 2022-12-24 NOTE — Other (Unsigned)
Handoff report given to RN and AAC .

## 2022-12-24 NOTE — Other (Unsigned)
Total Knee Arthroplasty    Documentation of Medical Necessity  I hereby document that I have treated the above patient, and all reasonable  conservative treatments have failed to control their disease, which causes  significant pain and negatively influences their function and now requires   TKA.  The indication is advanced joint disease as demonstrated by X-ray and one or  more of the following conservative treatments is contraindicated or have been  tried and failed for three (3) months or more: anti-inflammatory medicine. I  also certify that the patient does not have any of the following  contraindications: active infection of the knee joint.    Procedure Note    Date: 12/24/2022    Pre-op Diagnosis: Primary osteoarthritis of right knee (M17.11)    Post-op Diagnosis: Primary osteoarthritis of right knee (M17.11)    Surgeon(s):  Alphonsus Sias., MD    Staff: Primary Circulator: Marin Roberts  Physician Assistant: Alla Feeling., PA  Primary Scrub Person: Theadore Nan  Surgical Assistant: Noelle Penner    Findings: Same as Pre-op Diagnosis.    Procedure(s):  Procedure(s) with comments:  Right Total Knee Arthroplasty - Right Total Knee Arthroplasty    Estimated Blood Loss: less than 50 ml    Specimens: * No specimens in log *    Findings: Severe osteoarthritis knee.    Procedure Details  The patient was seen in the Holding Room. The risks, benefits, complications,  treatment options, and expected outcomes were discussed with the patient. The  risks and potential complications of their problem and purposed treatment  include but are not limited to infection, bleeding, pain, stiffness, nerve and  vessel injury and complication secondary to the anesthetic. The patient  concurred with the proposed plan, giving informed consent.  The site of   surgery  properly noted/marked. The patient was  identified as Jodi Walls and the  procedure verified as rightTotal Knee Arthroplasty. A Time Out was held and    the  above information confirmed.    The patient was brought to the operating room, placed on the operating table   in  a supine position. Following the successful induction of anesthesia the   correct  lower extremity was scrubbed, prepped and draped in the usual sterile fashion.  The  lower extremity was then elevated, wrapped out with a sterile Esmarch,   the  knee was then flexed and the tourniquet was inflated to 350 mmHg. A routine  longitudinal anteromedial incision was made. A mid-vastus approach was  performed.  Advanced arthritis was found.    The distal femur was sized and resected using the intramedullary guides.  The  proximal tibia was sized and resected using the extramedullary guides.    Flexion  and extension spaces were balanced and varus valgus stability and balance were  achieved. The patella was resurfaced.  Trial components were inserted and the  knee could be flexed 120 with full extension.  The patellofemoral tracking was  normal. No lateral impingement was present.    Having completed the trial reduction, all trial components were removed. With  routine cement technique then sequentially, the patellar component, tibial  component and femoral components were cemented in place.    Final reduction was the same as the trial reduction with full range of motion,  normal stability, normal extensor mechanism function and a normal patellar  femoral tracking.    Having completed the procedure, the knee was thoroughly cleaned with pulse  lavage.  The  arthrotomy was closed with PDS and the subcutaneous tissue with  Vicryl.  Staples were used in the skin.   A sterile compression dressing was  applied..    Instrument, sponge, and needle counts were correct prior to wound closure and   at  the conclusion of the case.    The physician assistant was present during the entire case.  Due to the  complexity of the surgery, the PA was necessary for assistance with exposure,  soft tissue retraction, and  closure.      Implants: DePuy Attune Rotating Platform Knee    Femur size: 5N    Tibia size: 4    Rotating Platform: 6    Patella size: 32    Complications:  None; patient tolerated the procedure well.    Disposition: PACU - hemodynamically stable.    Condition: stable

## 2022-12-25 NOTE — HIE Documentation (Unsigned)
 Inpatient Occupational Therapy Consult Note    Recommendations  Assessment  OT Assessment Complete?: Yes  Deficits: Impaired ADL status;Impaired activity tolerance;Impaired   instrumental  ADL;Impaired self-care transfers  Rehab Potential: Excellent  Patient Strengths: Motivation;Family support;Supportive discharge  environment;Home set-up;Prior level of function  Barriers to Goal Achievement: Decreased strength;Decreased balance;Decreased  functional activity tolerance;Decreased insight to   deficits;Safety;Pain;Limited  physical assist/supervision from family  Goal Formulation: Pt/family goal (specify) (to get better/go home)    Plan  Treatment Interventions: ADL Retraining;Functional Chemical Engineer;Therapeutic  exercise;Endurance Training;Patient/Family Training;Compensatory  Strategies;Therapeutic activities  OT Frequency: Per Protocol  Follow-up Required?: Yes    OT Recommendations  OT Recommendations: Short-term skilled OT  OT Equipment Recommended:  (defer)      Assessment  Precautions  Activity Level: Activity as tolerated  Weight Bearing Status: Weight bearing as tolerated  Joint Precautions: None  Cardiac: None  Other Precautions: s/p R TKA    Home Living  Type of Home: House  Home Layout: One Level  Entrance: Stairs  # of stairs to enter: 2  Railings Present at Entrance: None  Total # of Floors: 1 Floors  Bathroom Shower/Tub: Pension Scheme Manager: Standard  Home Equipment: Musician;Wheelchair-manual;Dentures    Prior Function  Level of Independence: Independent with ADLs;Independent with functional  mobility without AD;Independent Homemaking  Lives With: Alone  Receives Help From: Family  Homemaking Responsibilities: Meal Prep;Cleaning;Laundry;Vacuuming;Driving  Vocational: Retired  Additional Comments: Patient requesting to discharge to SNF for rehabilitation  d/t living alone.    ADL  Where Assessed: Bathroom  Eating Assistance: Independent  Grooming Assistance:  Supervision  Bathing Assist: Other (comment) (CGA)  Bathing Deficit: Setup;Supervision/Safety;Verbal Cueing  UE Dressing Assist: Supervision  UE Dressing Deficit: Supervision/safety;Setup  LE Dressing Assist: Other (comment) (CGA)  LE Dressing Deficit: Set-up;Verbal cueing;Requires assistive device for  steadying;Supervision/safety;Increased time to compete  Toileting Assist: Comment (other) (CGA)  Toileting Deficit: Setup;Supervison/Safety;Verbal Cueing;Grab Bar Use    Bed Mobility  Rolling: Unable to assess  Supine to Sit: Supervision;Needs Side Rails;HOB elevated  Sit to Supine: Unable to assess  Sit to Stand: CGA;1 assist;With Assistive Device (specify) (RW)  Bed to Chair: CGA;Via Ambulation (RW)    Functional Transfers  Toilet Transfers: CGA;Via ambulation (RW + GB for steadying)  Tub Transfers: Unable to Assess  Shower Transfers: Unable to Assess  Car Transfers: Unable to Assess    Vision and Hearing  Current Vision: Wears glasses only for reading  Hearing: WFL        Cognition  Overall Cognitive Status: Does not interfere with functional mobility or ADL  Arousal/Alertness: Alert  Behavior: Cooperative;Pleasant;Appropriate  Attention Span: Appears intact  Orientation: Time;Situation;Place;Person  Following Commands: Follows multi-step commands w/out difficulty  Initiation/Sequencing/Organization: Appears intact  Safety Judgement: Good awareness of safety precautions  Problem Solving: Able to problem solve independently    Perseveration  Perseveration: Not present    Praxis/Motor Planning  Praxis/ Motor Planning: Functional for ADL    Coordination  Coordination: WDL;Rapid alternating movement - able to perform    Light Touch  RUE: No apparent deficit  LUE: No apparent deficit        Proprioception  RUE: No apparent deficit  LUE: No apparent deficit    Balance  Sitting-Static: Fair+ - Maintains balance with minimal challenges from all  directions  Sitting-Dynamic: Fair+ - Maintains balance through minimal  excursions of   active  truck movement  Standing-Static: Fair - Able to stand unsupported without balance loss  or UE  support  Standing-Dynamic: Fair- - Maintains balance through minimal excursions of   active  trunk movement with CGA    RUE Assessment  RUE Assessment: ROM and Strength WFL    LUE Assessment  LUE Assessment: ROM and Strength WFL    Hand Function  Gross Grasp: Functional  Hand Coordination: Impaired coordination R    6 clicks Daily Activity  Putting on and taking off regular upper body clothing?: None  Bathing (including, washing, rinsing, drying)?: A little  Toileting, which includes using toilet, bedpan or urinal?: A little  Putting on and taking off regular lower body clothing?: A little  Taking care of person groom such as teeth brushing?: None  Eating meals?: None  Daily Activity Raw Score: 21      Pain Information  Pain Assessment (VERBAL)  Q Caregiver Change and PRN  Patient Currently in Pain (Verbal & Non-Verbal): Yes  Pain Location: Knee  Pain Laterality/Orientation: Right  Pain Descriptors: Aching  Pain Intensity (Verbal): Moderate Pain  Here Is How I Can Help You With Your Pain: Pain Med (PRN-See eMAR)        Recommendations  Assessment  OT Assessment Complete?: Yes  Deficits: Impaired ADL status;Impaired activity tolerance;Impaired   instrumental  ADL;Impaired self-care transfers  Rehab Potential: Excellent  Patient Strengths: Motivation;Family support;Supportive discharge  environment;Home set-up;Prior level of function  Barriers to Goal Achievement: Decreased strength;Decreased balance;Decreased  functional activity tolerance;Decreased insight to   deficits;Safety;Pain;Limited  physical assist/supervision from family  Goal Formulation: Pt/family goal (specify) (to get better/go home)        Plan  Treatment Interventions: ADL Retraining;Functional Chemical Engineer;Therapeutic  exercise;Endurance Training;Patient/Family Training;Compensatory  Strategies;Therapeutic activities  OT  Frequency: Per Protocol  Follow-up Required?: Yes    OT Recommendations  OT Recommendations: Short-term skilled OT  OT Equipment Recommended:  (defer)    Goals to be met by 12/28/2022:  1.) Pt. will complete toilet transfer via ambulation w/ supervision.  2.) Pt will complete toileting tasks w/ supervision.  3.) Pt. will complete LB dressing with supervision and AE as needed.  4.) Pt. will complete safe car transfer w/ supervision.      Occupational Therapy Evaluation Charge Reference  Occupational Profile & Medical History:  Refer to assessment section above including precautions, home living, and   prior  function.  Brief history Low  Expanded review Mod x  Extensive review High  Assessments of Occupational Performance:  Refer to assessment section above including ADL, bed mobility, functional  transfers, vision & hearing, visual perceptual assessment, cognition,  perseveration, motor planning, coordination, touch, proprioception, balance,  ROM/MMT and hand function.  1-3 performance deficits Low  3-5 performance deficits Mod x  5 or more performance deficits High  Clinical Decision Making:  Refer to recommendations section above including assessment, plan, and  recommendations.  Low analytical complexity, limited treatment options, no assessment  modifications, no co-morbidities Low  Moderate analytical complexity, min-mod assessment modifications, mod   treatment  options, may have co-morbidities Mod x  High analytical complexity, comprehensive assessments, multiple treatment  options, significant modifications of assessments, co-morbidities that affect  performance High    If pt is dc'd prior to next session, this document will serve as dc summary.    20 minutes    Therapist: CALHOUN, KAYSIE, OTR/L  Date: 12/25/2022

## 2022-12-25 NOTE — HIE Documentation (Unsigned)
 Subjective:    Post-Operative Day: 1 Status Post right total knee arthroplasty  Systemic or Specific Complaints:  ORTHOMIX given intraop.  Knee pain control   is  adequate currently.  Denies any CP or SOB.  Some intermittent nausea   yesterday.  Has functional limitations that would preclude patient from safely returning  home at discharge.  Therefore, social work has been consulted.    Objective:    Patient Vitals for the past 24 hrs:   BP Temp Temp src Pulse Resp SpO2  12/25/22 0319 114/57 (!) 97.4 ?F (36.3 ?C) Oral 55 16 100 %  12/24/22 2308 138/71 (!) 97.5 ?F (36.4 ?C) Oral 88 16 100 %  12/24/22 1949 105/61 (!) 97.4 ?F (36.3 ?C) Axillary 65 16 93 %  12/24/22 1452 118/68 (!) 97.3 ?F (36.3 ?C) Axillary 72 14 100 %  12/24/22 1214 119/60 (!) 97.4 ?F (36.3 ?C) Oral 74 16 99 %  12/24/22 1200 111/66 (!) 97.3 ?F (36.3 ?C) Core 76 15 98 %  12/24/22 1145 115/63 (!) 97 ?F (36.1 ?C) Core 74 17 98 %  12/24/22 1130 118/65 -- -- 73 15 100 %  12/24/22 1115 128/64 -- -- 73 16 100 %  12/24/22 1100 136/73 -- -- 77 17 100 %  12/24/22 1051 117/59 (!) 96.8 ?F (36 ?C) Core 77 (!) 10 96 %  12/24/22 0940 -- -- -- 66 16 100 %  12/24/22 0935 -- -- -- 66 16 99 %  12/24/22 0930 -- -- -- 69 16 98 %  12/24/22 0925 -- -- -- 65 16 98 %  12/24/22 0920 -- -- -- 65 16 98 %      General: alert, cooperative, no distress, appears stated age  Wound: Dressing c/d/i  Motion: NVI Ankle plantarflexion = dorsiflexion  DVT Exam: No evidence of DVT seen on physical exam.  Negative Homan's sign.    Data Review  CBC:  Lab Results  Component Value Date   WBC 8.37 12/10/2022   RBC 4.63 12/10/2022   HGB 13.4 12/10/2022   HCT 41.9 12/10/2022   PLT 257 12/10/2022      Assessment:    Status post right total knee arthroplasty.  CAD  CRF  GERD  HTN  HLP  RA  Thyroid d/o    Plan:    Continues current post-op course  PT/OT  IS/ankle pumps  WBAT  Encourage oral fluid intake  Aspirin  for DVT prophylaxis.  SCDs, TED hose.  Discussed pain management with patient.   Continue current regimen.  SW following for placement.

## 2022-12-25 NOTE — HIE Documentation (Unsigned)
 Subjective:    C/c status post RTKA/hypothyroid/CAD/GERD    Interval history - Patient seen and examined at the bedside. Interval labs,  cultures and imaging reviewed as indicated below. Additions to the chart  reviewed. D/w staff in detail. Creatinine 1.7/CO2 21.      Past Medical History:  Diagnosis Date   Arthritis   left knee OA   Back problem   s/p surgery   Breast cancer (CMS HCC)   treated with radical mastectomy   Cardiomyopathy (CMS HCC)   Coronary artery disease   CRF (chronic renal failure)   stage 3; monitored; nephrologist - Dr. Marene   GERD (gastroesophageal reflux disease)   Hiatal hernia   Hx of blood clots 2002   hand/arm post surgery   HX OTHER MEDICAL   NO BP or VENIPUNCTURE RIGHT ARM; s/p mastectomy   Hyperlipidemia   Hypertension   Kidney stones   passed   LBBB (left bundle branch block)   Nausea & vomiting   Neck problem   s/p surgery   Postoperative nausea and vomiting   RA (rheumatoid arthritis) (CMS HCC)   Thyroid disorder   hypo    Past Surgical History:  Procedure Laterality Date   HX BACK SURGERY   HX CATARACT REMOVAL WITH IMPLANT   HX CHOLECYSTECTOMY   HX COLONOSCOPY   HX DENTAL SURGERY   HX FINGER TRIGGER RELEASE Left   HX HYSTERECTOMY   HX MASTECTOMY   right mastectomy with breast implant   HX NECK SURGERY   HX TOTAL KNEE ARTHROPLASTY Left   PR APPENDECTOMY   PR ARTHROPLASTY KNEE HINGE PROSTHESIS Right 12/24/2022   Right Total Knee Arthroplasty performed by Omar Belvie MATSU., MD at [facility]   PR ARTHRP KNE CONDYLE&PLATU MEDIAL&LAT COMPARTMENTS  06/03/2013   lt    Current Facility-Administered Medications  Medication Dose Route Frequency Provider Last Rate Last Admin   acetaminophen  (TYLENOL ) tablet 1,000 mg  1,000 mg Oral Q6H Mia Locus J.,  PA   1,000 mg at 12/25/22 1253   aspirin  chewable tablet 81 mg  81 mg Oral BID Washnock, Eric J., PA   81 mg   at  12/25/22 0816   baclofen (LIORESAL) tablet 10 mg  10 mg Oral TID PRN Mia Locus PARAS., PA     10  mg at 12/25/22  0816   diphenhydrAMINE  (BENADRYL ) capsule 25 mg  25 mg Oral Q6H PRN Mia Locus PARAS.,  PA   furosemide  (LASIX ) tablet 40 mg  40 mg Oral Daily Krimerman, Naum S., MD   40  mg at 12/25/22 0816   gabapentin  (NEURONTIN ) capsule 600 mg  600 mg Oral QHS Mia Locus PARAS., PA  600 mg at 12/24/22 2246   levothyroxine  (SYNTHROID ) tablet 100 mcg  100 mcg Oral Daily Mia Locus PARAS.,  PA   100 mcg at 12/25/22 0530   morphine 4 mg/mL injection 2-4 mg  2-4 mg Intravenous Q2H PRN Mia Locus PARAS., PA   ondansetron  (PF) (ZOFRAN ) injection 4 mg  4 mg Intravenous Q6H PRN Mia Locus PARAS., PA   4 mg at 12/24/22 1645   ondansetron  (ZOFRAN ) tablet 4 mg  4 mg Oral Q6H PRN Mia Locus PARAS., PA   oxyCODONE  (ROXICODONE ) immediate release tablet 5 mg  5 mg Oral Q4H PRN  Mia Locus PARAS., PA   oxyCODONE  (ROXICODONE ) immediate release tablet 5-10 mg  5-10 mg Oral Q4H PRN  Mia Locus PARAS., PA  5 mg at 12/25/22 1044   potassium chloride  (KDUR) tablet 10 mEq  10 mEq Oral BID Mia Camellia PARAS.,   PA  10 mEq at 12/25/22 9183   predniSONE  (DELTASONE ) tablet 5 mg  5 mg Oral QAM Mia Camellia PARAS., PA   5   mg  at 12/25/22 0816   saline lock 0.9 % flush 3-10 mL  3-10 mL Intravenous PRN Mia Camellia PARAS.,   PA   sodium chloride  0.9% IV solution  100 mL/hr Intravenous Continuous Mia Camellia PARAS., PA   Stopped at 12/25/22 9245   sodium chloride  infusion 0.9% flush bag  30 mL Intravenous PRN Mia Camellia PARAS., PA   tamsulosin (FLOMAX) capsule 0.4 mg  0.4 mg Oral Daily PRN Mia Camellia PARAS.,   PA    Allergies  Allergen Reactions   Ace Inhibitors    cough   Atenolol     Bradycardia (HR 40's)   Codeine Nausea And Vomiting   Lipitor (Atorvastatin)    Muscle aches    Social History    Tobacco Use   Smoking status: Never    Passive exposure: Never   Smokeless tobacco: Never  Substance Use Topics   Alcohol use: No    Family History  Problem Relation Name Age of Onset   Heart Problems Father       bypass surgery   Anesthesia Complications Neg  Hx      Review of Systems  A complete review of systems was performed including:    General ROS:no night sweats, no fever, no chills  Ophthalmic ROS:  no eye pain, no visual changes  ENT ROS no sinus congestion  Hematological and Lymphatic ROS: no swollen lymph nodes  Respiratory ROS:  no shortness of breath  Cardiovascular ROS:no chest pain, no palpitations  Gastrointestinal ROS: no nausea/vomiting , no abdominal pain,  no melena  Genito-Urinary ROS:no hematuria, no dysuria  Musculoskeletal ROS:no joint swelling  Neurological ROS:no numbness or tingling of fingers or toes  Dermatological ROS:no rash  Endocrine ROS: no unexpected weight changes.      All other systems reviewed are negative/except as posted per ROS and HPI      Objective:        Intake/Output Summary (Last 24 hours) at 12/25/2022 1339  Last data filed at 12/25/2022 1046  Gross per 24 hour  Intake 2141.13 ml  Output 225 ml  Net 1916.13 ml      BP 106/50   Pulse 69   Temp 97.7 ?F (36.5 ?C) (Oral)   Resp 16   Ht 5'  (1.524 m)   Wt 172 lb 6.4 oz (78.2 kg)   SpO2 99%   BMI 33.67 kg/m?  General appearance: alert, cooperative  Head: Normocephalic, without obvious abnormality, atraumatic  Eyes: conjunctivae/corneas clear. PERRL  Throat: abnormal findings: dentition: poor  Neck: supple, symmetrical, trachea midline, no adenopathy and thyroid: not  enlarged  Lungs: clear to auscultation bilaterally  Heart: regular rate and rhythm, S1, S2  no gallop or rub  Abdomen: soft, non-tender. Bowel sounds normal. No masses,  no organomegaly  Extremities: post op R knee pain and edema  Pulses: 2+ and symmetric  Skin: Skin color, texture, turgor normal. No rashes or lesions  Lymph nodes: Cervical, supraclavicular, and axillary nodes normal.  Neurologic: Cranial nerves appear to be intact 2-12, DTR 2/4. No sensory  deficit.      ECG:    Data Review:No results for input(s): WBC, HGB, HCT, MCV, PLT in  the  last 72 hours.  Recent Labs    12/25/22  0443  NA  139  K 4.0  CO2 21*  BUN 19  CREATININE 1.67*    Lab Results  Component Value Date   GLU 157 (HIGH) 12/25/2022   GLU 121 (HIGH) 12/10/2022   GLU 90 08/15/2018    Lab Results  Component Value Date   ALB 4.0 12/10/2022    No results found for: INR, PROTIME  No results found for: IRON, TIBC, FERRITIN  Lab Results  Component Value Date   MG 1.9 08/15/2018    No results found for: FT3, FREET4, TSH  No results found for: PHENYTOIN, PHENOBARB, VALPROATE, CBMZ  No results found for: VITAMINB12  No results found for: FOLATE  Lab Results  Component Value Date   HGBA1C 5.4 12/10/2022    No results found for: CKTOTAL, CKMB, CKMBINDEX, TROPONINI  Recent Labs    12/25/22  0443  GLU 157*    Radiology review:  No orders to display          Assessment and Plan:    Status post R TKA.  RA on chronic steroids/chronic steroid use.  HTN  HLD  Hypothyroidism  Chronic diastolic CHF  HLD  HTN    Plan.  Hypothyroidism - will continue synthroid  as per current schedule and modify  accordingly.  Continue on lasix  with hold criteria.  Continue on home prednisone  5 mg daily - monitor for hypotension.  Continue on IV fluids for now.  Continue on neurontin  600 mg at bedtime for chronic pain mgmt.  Ongoing K supplementation and monitoring.                      Signed:  Ninfa CANDIE Burows, MD  12/25/2022  1:39 PM

## 2022-12-25 NOTE — HIE Documentation (Unsigned)
 Inpatient Physical Therapy Progress Note      S: Pt supine in bed at start of session and seated in chair, all needs in   reach  and alarm activated at end of session. Pt states I wont really have anyone at  home    O:  Precautions  Activity Level: Activity as tolerated  Weight Bearing Status: Weight bearing as tolerated  Joint Precautions: None  Cardiac: None  Other Precautions: R TKA        Bed Mobility  Supine to Sit: SBA;HOB elevated  Sit to Supine: Unable to assess (Pt seated in chair, all needs in reach and  alarm activated at end of session)  Sit to Stand: CGA;With Assistive Device (specify) (RW)  Bed to Chair: SBA;With Assistive Device (specify) (RW)    Wheelchair Mobility  Wheelchair assistance needed: Not applicable, patient is ambulatory    Gait  Pattern: Narrow BOS;Decreased Cadence;Decreased heel strike L;Decreased heel  strike R;Decreased step length R;Decreased step length L;Step-through;Trunk  flexion  Gait Assistance: SBA  Assistive Device: Rolling walker  Distance (ft): 25 Feet  Additional Comments: Pt ambulates with decreased gait speed but remains steady  throughout without any LOB or overt knee buckling    Balance  Sitting-Static: Fair+ - Maintains balance with minimal challenges from all  directions  Sitting-Dynamic: Fair+ - Maintains balance through minimal excursions of   active  truck movement  Standing-Static: Fair - Able to stand unsupported without balance loss or UE  support  Standing-Dynamic: Fair- - Maintains balance through minimal excursions of   active  trunk movement with CGA    Coordination  Coordination: No gross deficits noted  Coordination comments: Functional    Cognition  Overall Cognitive Status: Does not interfere with functional mobility or ADL  Arousal/Alertness: Alert  Behavior: Cooperative;Pleasant;Appropriate  Attention Span: Appears intact  Orientation: Person;Place;Time;Situation  Following Commands: Follows one step commands w/out  difficulty  Initiation/Sequencing/Organization: Appears intact  Safety Judgement: Good awareness of safety precautions  Problem Solving: Able to problem solve independently        Interventions/Exercises  Balance Training (sitting): Sitting Tolerance;Weight shifting  anterior/posterior;Weight shifting laterally;Sitting reaching   activities;Sitting  weight shifting all planes (sitting on toilet, weight shifting and reaching  outside base of support for pericare ~ 3 minutes)  Balance Training (standing): Standing Tolerance;Weight shifting  anterior/posterior;Weight shifting laterally (standing at sink, weight   shifting  and washing hands ~ 1 minute)  Standardized Balance Assessment and Score: Five Times Sit to Stand  Functional Activity Tolerance: Endurance does limit participation in activity  Endurance does limit participation: Rest breaks with gait;Rest breaks with  exercise;Medical status limits tolerance      Five Times Sit to Stand    Instructions  1) Patient sits with arms folded across chest and back against chair (standard  chair with arms)  2) Instruct patient: I want you to stand up and sit down five times as quickly  as you can when I say go  3) Timing begins at go and stops when buttocks touches the chair after the 5th  repetition  4) Inability to complete 5 repetitions without assistance or UE use indicates  failure of the test    Results  Completed Five Times Sit to Stand in 59.2  seconds with bilateral UE support.    Interpretation  >/= 12 seconds indicated need for further fall assessment  >15 seconds indicates risk of recurrent falls    Age Specific Norms  60-69: 11.4 sec  70-79: 12.6 sec  80-89: 14.8 sec        A: Patient with fait tolerance to therapy session this date. Patient  demonstrates bed mobility without any physical assistance. Patient continues   to  ambulate with decreased gait speed but remains steady throughout without any   LOB  or overt knee buckling. Patient performs five times  sit to stand in 59.2  seconds, indicating that she is at increased risk for falls. Session limited  secondary to fatigue and LE weakness. Patient continues to benefit from the  skill of the physical therapist in order to improve endurance, strength and  balance in order to promote functional independence.    Short term goals to be met by 12/31/22- progressing  Pt will perform supine <> sit, supervision  Pt will perform sit <>stand supervision  Pt will ambulate 150 ft with LRAD, SBA  Pt will ascend/descend 2 steps with 2 HR, SBA.  Pt will perform 10-15 BLE exercises with minimal cueing.    P: Continue PT POC and progress as patient tolerates and medical status  improves. Let this note serve at d/c summary if pt is discharged before next  therapy session.          Recommendations  PT Recommendations  PT Recommendations: Short-term skilled PT  PT Equipment Recommended: Defer to next level of care    Treat 15 minutes  Charge 1 TA    Therapist: Rosina Matter, PT, DPT  Date: 12/25/2022

## 2022-12-26 NOTE — Other (Unsigned)
 Called MRI to see when pt would going down for test.  Spoke w/ Adonis Brook RT. Couldn't give a time but would reach out when  available.

## 2022-12-26 NOTE — HIE Documentation (Unsigned)
 Secure chatted Dr. Krimerman and PA Camellia PARAS [facility] [place].    Pt is AOx2. Light sedation. Does not maintain eye contact for long.No muscular  or neurological weaknesses noted. Words are mumbled/soft. Inattention to   current  topic of discussion.    Asked if any current medications should be held. Also asked if labs should be  taken (last BMP & CBC on 12/25/2022)    Dr. Krimerman stopped by and saw pt. Discussed pt with Dr. Krimerman and   course  of action.  Gabapentin , lasix  and potassium held Pain medication oxycodone  changed from   5-10  mg to 2.5-5 mg.  STAT MRI Head WO Contrast ordered.  BMP & CBC ordered.  One time dose of SOLUCORTEF ordered.    Will continued to monitor.

## 2022-12-26 NOTE — HIE Documentation (Unsigned)
 Inpatient Physical Therapy Progress Note    PT orders received and pt chart reviewed. Per RN, unsafe to attempt pt session  at this time, waiting on MRI and figuring out pt medication secondary to   change  in status. Will continue to follow as patient status and schedule allows.    No charge.        Therapist: Rosina Matter, PT, DPT  Date: 12/26/2022

## 2022-12-26 NOTE — HIE Documentation (Unsigned)
 Inpatient Occupational Therapy Progress Note      S: I don't know- orientation questions    O:  Precautions  Activity Level: Activity as tolerated  Weight Bearing Status: Weight bearing as tolerated  Joint Precautions: None  Cardiac: None  Other Precautions: s/p R TKA    Cognition  Overall Cognitive Status: Impaired  Arousal/Alertness: Groggy;Lethargic  Behavior: Flat Affect  Attention Span: Difficulty attending to directions  Orientation: Person  Following Commands: Variable command following through session;Requires  repetition;Requires increased time  Initiation/Sequencing/Organization: Appears intact;Cues to initiate task  Safety Judgement: Decreased safety awareness;Decreased insight of  deficits;Decreased awareness of assistance need  Problem Solving: Assistance req'd to generate solutions;Assistance req'd to  identify errors;Assistance req'd to implement solutions    Bed Mobility  Supine to Sit: CGA;Needs Side Rails;HOB elevated  Sit to Stand: CGA;1 assist;With Assistive Device (specify) (RW)  Bed to Chair: Min Assist;Via Ambulation (RW)        ADL  Where Assessed: Chair  LE Dressing Assist: Minimal  LE Dressing Deficit: Set-up;Steadying;Requires assistive device for  steadying;Verbal cueing;Supervision/safety;Increased time to compete;Cognitive  Difficulty        Balance  Sitting-Static: Fair+ - Maintains balance with minimal challenges from all  directions  Sitting-Dynamic: Fair- - Maintains balance through minimal excursions of   active  trunk movement with CGA  Standing-Static: Fair- - Maintains balance with CGA  Standing-Dynamic: Poor+ - Maintains balance through minimal excursions of   active  trunk movement with min assist    Hand Function  Gross Grasp: Functional  Hand Coordination: Impaired coordination R    Interventions/Exercises  Shoulder AROM/Strengthening: Functional Reach  Elbow AROM/Strengthening: Functional Reach  Hand strengthening: Functional Task  Fine motor training: Functional  task  Functional Cognitive Training: Orientation;Short-term recall  Standing Tolerance: PRN  Sitting tolerance: PRN  Postural Control: Core Strengthening;Trunk Mobility;Positioning  Balance Training: Sitting reaching activities;Sitting weight shifting all  planes;Standing reaching activities;Standing weight shifting all planes      A: Pt w/ poor tolerance to OT treatment, limited by lethargy and confusion. Pt  0/4 orientation, thinking it was February. Pt CGA-min A for mobility bed to  chair for LB Dressing. Pt unable to maintain appropriate alertness throughout-  deferred further mobility and transferred back to supine in bed for pt safety-  alarm activated. RN aware of pt status.    Goals to be met by 12/28/2022:  1.) Pt. will complete toilet transfer via ambulation w/ supervision.  2.) Pt will complete toileting tasks w/ supervision.  3.) Pt. will complete LB dressing with supervision and AE as needed.  4.) Pt. will complete safe car transfer w/ supervision.    P: Continue POC and dc recommendation for SNF. Defer OT DME needs.    If pt is dc'd prior to next session, this document will serve as dc summary.    Recommendations  OT Recommendations  OT Recommendations: Short-term skilled OT  OT Equipment Recommended:  (defer)    Therapist: CALHOUN, KAYSIE, OTR/L  Date: 12/26/2022

## 2022-12-26 NOTE — HIE Documentation (Unsigned)
 Subjective:    Post-Operative Day: 2 Status Post right total knee arthroplasty  Systemic or Specific Complaints:  notes more postop knee pain with orthomix  wearing off.  No c/o CP, SOB, or nausea.  SW following.    Objective:    Patient Vitals for the past 24 hrs:   BP Temp Temp src Pulse Resp SpO2  12/26/22 0738 129/61 (!) 97.5 ?F (36.4 ?C) Oral 63 14 98 %  12/26/22 0429 99/55 -- -- -- 16 --  12/26/22 0345 (!) 93/45 (!) 97.4 ?F (36.3 ?C) Oral 56 16 98 %  12/25/22 2310 (!) 105/47 (!) 97.4 ?F (36.3 ?C) Oral 58 16 100 %  12/25/22 1945 156/88 (!) 97.4 ?F (36.3 ?C) Oral 60 17 --  12/25/22 1518 147/70 (!) 97.5 ?F (36.4 ?C) Oral 59 15 96 %  12/25/22 1104 106/50 97.7 ?F (36.5 ?C) Oral 69 16 99 %  12/25/22 0812 128/58 (!) 97.2 ?F (36.2 ?C) Oral 61 15 99 %      General: alert, cooperative, no distress, appears stated age  Wound: Dressing c/d/i  Motion: NVI Ankle plantarflexion = dorsiflexion  DVT Exam: No evidence of DVT seen on physical exam.  Negative Homan's sign.    Data Review  CBC:  Lab Results  Component Value Date   WBC 8.37 12/10/2022   RBC 4.63 12/10/2022   HGB 13.4 12/10/2022   HCT 41.9 12/10/2022   PLT 257 12/10/2022      Assessment:    Status post right total knee arthroplasty.  CAD  CRF  GERD  HTN  HLP  RA  Thyroid d/o    Plan:    Continues current post-op course  PT/OT  IS/ankle pumps  WBAT  Encourage oral fluid intake  Aspirin  for DVT prophylaxis.  SCDs, TED hose.  Discussed pain management with patient.  Continue current regimen.  Discharge when placement confirmed.

## 2022-12-26 NOTE — Other (Unsigned)
R: Patient returned from MRI via bed by transporter.

## 2022-12-26 NOTE — HIE Documentation (Unsigned)
 Subjective:    C/c status post RTKA/hypothyroid/CAD/GERD    Interval history - Patient seen and examined at the bedside. Interval labs,  cultures and imaging reviewed as indicated below. Additions to the chart  reviewed. D/w staff in detail. Reported to have MS change. Received neuronting  and oxy IR yesterday. Noted to have low BP Very drowsy/difficult to arouse/but  able to move extremities/answer simple questions and commands. D/w RN.      Past Medical History:  Diagnosis Date   Arthritis   left knee OA   Back problem   s/p surgery   Breast cancer (CMS HCC)   treated with radical mastectomy   Cardiomyopathy (CMS HCC)   Coronary artery disease   CRF (chronic renal failure)   stage 3; monitored; nephrologist - Dr. Marene   GERD (gastroesophageal reflux disease)   Hiatal hernia   Hx of blood clots 2002   hand/arm post surgery   HX OTHER MEDICAL   NO BP or VENIPUNCTURE RIGHT ARM; s/p mastectomy   Hyperlipidemia   Hypertension   Kidney stones   passed   LBBB (left bundle branch block)   Nausea & vomiting   Neck problem   s/p surgery   Postoperative nausea and vomiting   RA (rheumatoid arthritis) (CMS HCC)   Thyroid disorder   hypo    Past Surgical History:  Procedure Laterality Date   HX BACK SURGERY   HX CATARACT REMOVAL WITH IMPLANT   HX CHOLECYSTECTOMY   HX COLONOSCOPY   HX DENTAL SURGERY   HX FINGER TRIGGER RELEASE Left   HX HYSTERECTOMY   HX MASTECTOMY   right mastectomy with breast implant   HX NECK SURGERY   HX TOTAL KNEE ARTHROPLASTY Left   PR APPENDECTOMY   PR ARTHROPLASTY KNEE HINGE PROSTHESIS Right 12/24/2022   Right Total Knee Arthroplasty performed by Omar Belvie MATSU., MD at [facility]   PR ARTHRP KNE CONDYLE&PLATU MEDIAL&LAT COMPARTMENTS  06/03/2013   lt    Current Facility-Administered Medications  Medication Dose Route Frequency Provider Last Rate Last Admin   acetaminophen  (TYLENOL ) tablet 1,000 mg  1,000 mg Oral Q6H Mia Camellia PARAS.,  PA   1,000 mg at 12/25/22 2006   aspirin  chewable  tablet 81 mg  81 mg Oral BID Washnock, Eric J., PA   81 mg   at  12/25/22 2006   baclofen (LIORESAL) tablet 10 mg  10 mg Oral TID PRN Mia Camellia PARAS., PA     10  mg at 12/25/22 2007   diphenhydrAMINE  (BENADRYL ) capsule 25 mg  25 mg Oral Q6H PRN Mia Camellia PARAS.,  PA   (Held by provider) furosemide  (LASIX ) tablet 40 mg  40 mg Oral Daily   Krimerman,  Naum S., MD   40 mg at 12/25/22 9183   (Held by provider) gabapentin  (NEURONTIN ) capsule 600 mg  600 mg Oral QHS  Mia Camellia PARAS., PA   600 mg at 12/25/22 2006   hydrocortisone sodium succinate (SOLUCORTEF) injection 100 mg  100 mg  Intravenous Once Krimerman, Naum S., MD   levothyroxine  (SYNTHROID ) tablet 100 mcg  100 mcg Oral Daily Washnock, Eric   J.,  PA   100 mcg at 12/26/22 0744   morphine 4 mg/mL injection 2-4 mg  2-4 mg Intravenous Q2H PRN Mia Camellia PARAS., PA   ondansetron  (PF) (ZOFRAN ) injection 4 mg  4 mg Intravenous Q6H PRN Mia Camellia PARAS., PA   4 mg at 12/24/22 1645  ondansetron  (ZOFRAN ) tablet 4 mg  4 mg Oral Q6H PRN Mia Camellia PARAS., PA   oxyCODONE  (ROXICODONE ) immediate release tablet 2.5 mg  2.5 mg Oral Q4H PRN  Krimerman, Naum S., MD   potassium chloride  [facility]) tablet 10 mEq  10 mEq Oral BID Mia Camellia PARAS.,   PA  10 mEq at 12/25/22 2006   predniSONE  (DELTASONE ) tablet 5 mg  5 mg Oral QAM Mia Camellia PARAS., PA   5   mg  at 12/25/22 0816   saline lock 0.9 % flush 3-10 mL  3-10 mL Intravenous PRN Mia Camellia PARAS.,   PA   sodium chloride  0.9% IV solution  100 mL/hr Intravenous Continuous Krimerman,  Naum S., MD   sodium chloride  infusion 0.9% flush bag  30 mL Intravenous PRN Mia Camellia PARAS., PA   tamsulosin (FLOMAX) capsule 0.4 mg  0.4 mg Oral Daily PRN Mia Camellia PARAS.,   PA    Allergies  Allergen Reactions   Ace Inhibitors    cough   Atenolol     Bradycardia (HR 40's)   Codeine Nausea And Vomiting   Lipitor (Atorvastatin)    Muscle aches    Social History    Tobacco Use   Smoking status: Never    Passive exposure: Never   Smokeless  tobacco: Never  Substance Use Topics   Alcohol use: No    Family History  Problem Relation Name Age of Onset   Heart Problems Father       bypass surgery   Anesthesia Complications Neg Hx      Review of Systems  A complete review of systems was performed including:    General ROS:no night sweats, no fever, no chills  Ophthalmic ROS:  no eye pain, no visual changes  ENT ROS no sinus congestion  Hematological and Lymphatic ROS: no swollen lymph nodes  Respiratory ROS:  no shortness of breath  Cardiovascular ROS:no chest pain, no palpitations  Gastrointestinal ROS: no nausea/vomiting , no abdominal pain,  no melena  Genito-Urinary ROS:no hematuria, no dysuria  Musculoskeletal ROS:no joint swelling  Neurological ROS:no numbness or tingling of fingers or toes  Dermatological ROS:no rash  Endocrine ROS: no unexpected weight changes.      All other systems reviewed are negative/except as posted per ROS and HPI      Objective:        Intake/Output Summary (Last 24 hours) at 12/26/2022 1016  Last data filed at 12/26/2022 0448  Gross per 24 hour  Intake --  Output 1000 ml  Net -1000 ml      BP 129/61   Pulse 63   Temp (!) 97.5 ?F (36.4 ?C) (Oral)   Resp 14   Ht 5'  (1.524 m)   Wt 172 lb 6.4 oz (78.2 kg)   SpO2 98%   BMI 33.67 kg/m?  General appearance: alert, cooperative  Head: Normocephalic, without obvious abnormality, atraumatic  Eyes: conjunctivae/corneas clear. PERRL  Throat: abnormal findings: dentition: poor  Neck: supple, symmetrical, trachea midline, no adenopathy and thyroid: not  enlarged  Lungs: clear to auscultation bilaterally  Heart: regular rate and rhythm, S1, S2  no gallop or rub  Abdomen: soft, non-tender. Bowel sounds normal. No masses,  no organomegaly  Extremities: post op R knee pain and edema  Pulses: 2+ and symmetric  Skin: Skin color, texture, turgor normal. No rashes or lesions  Lymph nodes: Cervical, supraclavicular, and axillary nodes normal.  Neurologic: Cranial nerves appear to be intact  2-12, DTR  2/4. No sensory  deficit.      ECG:    Data Review:No results for input(s): WBC, HGB, HCT, MCV, PLT in the  last 72 hours.  Recent Labs    12/25/22  0443  NA 139  K 4.0  CO2 21*  BUN 19  CREATININE 1.67*    Lab Results  Component Value Date   GLU 157 (HIGH) 12/25/2022   GLU 121 (HIGH) 12/10/2022   GLU 90 08/15/2018    Lab Results  Component Value Date   ALB 4.0 12/10/2022    No results found for: INR, PROTIME  No results found for: IRON, TIBC, FERRITIN  Lab Results  Component Value Date   MG 1.9 08/15/2018    No results found for: FT3, FREET4, TSH  No results found for: PHENYTOIN, PHENOBARB, VALPROATE, CBMZ  No results found for: VITAMINB12  No results found for: FOLATE  Lab Results  Component Value Date   HGBA1C 5.4 12/10/2022    No results found for: CKTOTAL, CKMB, CKMBINDEX, TROPONINI  Recent Labs    12/25/22  0443  GLU 157*    Radiology review:  MRI-HEAD W/O CONTRAST    (Results Pending)          Assessment and Plan:    Status post R TKA.  RA on chronic steroids/chronic steroid use.  HTN  HLD  Hypothyroidism  Chronic diastolic CHF  HLD  HTN  New onset of the hypotension.  MS change.    Plan.  Check MRI of the head to r/o CVA  Check CBC/renal.  IV fluids NS 100 ml/h.  IV solucortef once.  Hold gabapentin .  Oxy IR to 2.5 mg from 5.  Hypothyroidism - will continue synthroid  as per current schedule and modify  accordingly.  Continue on lasix  with hold criteria.  Continue on home prednisone  5 mg daily - monitor for hypotension.  Ongoing K supplementation and monitoring.                      Signed:  Ninfa CANDIE Burows, MD  12/26/2022  10:16 AM

## 2022-12-26 NOTE — Other (Unsigned)
 D: Patient scheduled for MRI HEAD WO Contrast  A: Patient transferred to MRI via bed by transporter.

## 2022-12-27 NOTE — Other (Unsigned)
 STAT team in room.

## 2022-12-27 NOTE — Consults (Signed)
 -------------------------------------------------------------------------------  Attestation signed by Jodi Derryl MATSU., Walls at 12/27/22 1634  [email or web address]@  [id number]  [id number]  12/27/2022      COSIGN     I have seen and examined Jodi Walls , and agree with the attached relevant history by the Cardiology Fellow Jodi Walls examination, assessment and plan.     I was present during the critical or key portions of the service furnished by the Cardiology Fellow.   BP 110/63   Pulse (!) 103   Temp 98.3 F (36.8 C) (Oral)   Resp 16   Ht 5' (1.524 m)   Wt 172 lb 6.4 oz (78.2 kg)   SpO2 95%   BMI 33.67 kg/m   Constitutional: No distress.  Neurological: Alert and Oriented.  Chest:  No rales wheezes rhonchi.  Cardiac:  Normal S1 and S2.  No murmurs, gallops or rubs.  Normal PMI.  No JVD. No edema.  Chest pain is reproducible  Skin:  Warm and dry.  No rashes.  GI:  Non tender, non distended.  Normal bowel sounds.    Musculoskeletal:  No joint deformities.   Psychiatric: Normal affect.    Elevated troponins EF is down when compared to previous suggested possibly type 2 Takotsubo's possibly underlying ischemia   Possible PE   Pain is reproducible  Will arrange for workup for PE as well as left heart catheterization tomorrow nitroglycerin heparin  Data reviewed included: ECG, laboratory data, echocardiogram, CXR, [facility] records    I have answered all of the patient's and or bystander's questions.  I have discussed with fellow the findings and gone over the plans for the patient.   The MA who roomed my patient today acted as a neurosurgeon for me for this chart/ note      I spent 45  minutes on the chart reviewing it reviewing records discussing with patient examining patient discussing with other team members another caregivers  .time  Please see Fellow's note for details.       Medical decision making     [x]    1 or more chronic illness or injury that poses threat to life or bodily function     [x]    1 or more chronic illnesses  with exacerbation, progression or side effects of treat     []    Review of prior external notes     [x]    Review of results of testing     [x]    Ordering of a new test     [x]    Assessment requiring independent history     [x]    Independent interpretation of tests not billed by me     []    Discussion or coordination of care with other health care professionals     [x]    Drug therapy requiring intensive monitoring for toxicity     []    Decision regarding hospitalization or escalation of hospital care     []    Decision regarding do not resuscitate care     []    Decision regarding initiation or change in chemotherapy     []   Parenteral controlled substances use monitoring      -------------------------------------------------------------------------------          [facility]ation - [facility]  Clinical Cardiology Teaching Service Team    I am asked to evaluate Jodi Walls by Dr. Belvie JUDITHANN Mulders, Walls for Primary osteoarthritis of right knee [M17.11] for chest pain  PCP: Jodi Shams, Walls   Primary Cardiologist: Jodi Sharps, Walls    HPI:     Patient is a 82 y.o. female with PMH nonischemic cardiomypoathy, LBBB who initially presented to the hospital for right total knee replacement for whom cardiology was consulted for chest pain.    Stat chest pain was called on 8/29. Upon assessing patient, she did not seem like her usual self and was out of it, although answered appropriately to questions.  Nursing staff stated that for the last 2 days she has been more somnolent than normal and had MRI of her brain done which was negative for anything acute. She endorsed chest pain primarily on the right side of her chest and shoulder, with a pleuritic nature to the pain. She says that she was working with PT when she had the sudden onset of this pain. She was tachycardic with a left bundle branch block, normotensive on my evaluation. Left bundle branch block is known.     She does follow with Jodi Walls as  an outpatient for Cardiology last seen on August 2nd for preop clearance.  She has had a prior coronary artery calcium score less than 100.  She under a limited echocardiogram on 08/05 which showed an EF of 46% with paradoxical septal motion consistent with her underlying left bundle branch block.  Results of the echocardiogram were similar to her previous echocardiogram done in 2021.    She did have a nuclear stress test done in 2018 which was normal perfusion.    Troponin echocardiogram is pending at the time of writing this note.    ------------------------------------------------------------------------------------------     Past Medical History:   Diagnosis Date   . Arthritis     left knee OA   . Back problem     s/p surgery   . Breast cancer (CMS HCC)     treated with radical mastectomy   . Cardiomyopathy (CMS HCC)    . Coronary artery disease    . CRF (chronic renal failure)     stage 3; monitored; nephrologist - Dr. Marene   . GERD (gastroesophageal reflux disease)    . Hiatal hernia    . Hx of blood clots 2002    hand/arm post surgery   . HX OTHER MEDICAL     NO BP or VENIPUNCTURE RIGHT ARM; s/p mastectomy   . Hyperlipidemia    . Hypertension    . Kidney stones     passed   . LBBB (left bundle branch block)    . Nausea & vomiting    . Neck problem     s/p surgery   . Postoperative nausea and vomiting    . RA (rheumatoid arthritis) (CMS HCC)    . Thyroid disorder     hypo     Past Surgical History:   Procedure Laterality Date   . HX BACK SURGERY     . HX CATARACT REMOVAL WITH IMPLANT     . HX CHOLECYSTECTOMY     . HX COLONOSCOPY     . HX DENTAL SURGERY     . HX FINGER TRIGGER RELEASE Left    . HX HYSTERECTOMY     . HX MASTECTOMY      right mastectomy with breast implant   . HX NECK SURGERY     . HX TOTAL KNEE ARTHROPLASTY Left    . PR APPENDECTOMY     . PR ARTHROPLASTY KNEE HINGE PROSTHESIS Right 12/24/2022    Right  Total Knee Arthroplasty performed by Jodi Jodi MATSU., [facility]s at [facility]   . PR  ARTHRP KNE CONDYLE&PLATU MEDIAL&LAT COMPARTMENTS  06/03/2013    lt        -------------------------------------------------------------------------------------------  Prior to Admission Medications   Prescriptions Last Dose Informant Patient Reported? Taking?   FERROUS SULFATE  PO 12/20/2022  Yes No   Sig: Take by mouth every Monday, Wednesday, Friday.   Melatonin 3 mg Tablet 12/21/2022  Yes No   Sig: Take 3 mg by mouth nightly at bedtime.   aspirin  81 mg Tablet, Delayed Release (E.C.) 12/14/2022  Yes No   Sig: Take 1 Tablet by mouth daily.   cholecalciferol (VITAMIN D-3) 10 mcg (400 unit) Tablet 12/21/2022  Yes No   Sig: Take 400 Units by mouth daily.   cyanocobalamin  100 mcg Tablet 12/21/2022  Yes No   Sig: Take 100 mcg by mouth daily.   furosemide  (LASIX ) 40 mg tablet 12/23/2022  Yes Yes   Sig: Take 40 mg by mouth daily.   gabapentin  (NEURONTIN ) 300 mg capsule 12/23/2022 at 2100  Yes No   Sig: Take 600 mg by mouth nightly at bedtime.   levothyroxine  (SYNTHROID ) 75 mcg tablet 12/23/2022  Yes Yes   Sig: 100 mcg. daily   oxyCODONE  5 mg PO immediate release tablet 12/23/2022  Yes No   Sig: Take 5 mg by mouth 2 times daily.   potassium chloride  (KDUR) 10 mEq PO tablet 12/21/2022  Yes No   Sig: Take 10 mEq by mouth 2 times daily. daily   predniSONE  (DELTASONE ) 5 mg tablet 12/22/2022  Yes No   Sig: Take 5 mg by mouth every morning.      Facility-Administered Medications: None     Allergies   Allergen Reactions   . Ace Inhibitors      cough   . Atenolol       Bradycardia (HR 40's)   . Codeine Nausea And Vomiting   . Lipitor [Atorvastatin]      Muscle aches     -------------------------------------------------------------------------------------------     Social History     Tobacco Use   . Smoking status: Never     Passive exposure: Never   . Smokeless tobacco: Never   Substance Use Topics   . Alcohol use: No     Family History   Problem Relation Name Age of Onset   . Heart Problems Father          bypass surgery   . Anesthesia  Complications Neg Hx         Objective Data:     BP 128/83   Pulse (!) 105   Temp 98.3 F (36.8 C) (Oral)   Resp 16   Ht 5' (1.524 m)   Wt 172 lb 6.4 oz (78.2 kg)   SpO2 98%   BMI 33.67 kg/m     Intake/Output Summary (Last 24 hours) at 12/27/2022 1340  Last data filed at 12/27/2022 1202  Gross per 24 hour   Intake 1941.55 ml   Output 800 ml   Net 1141.55 ml       Wt Readings from Last 3 Encounters:   12/24/22 172 lb 6.4 oz (78.2 kg)   12/20/22 174 lb 3.2 oz (79 kg)   11/30/22 177 lb (80.3 kg)     Recent Weights:    12/10/22 1031 12/24/22 0724   Weight: 175 lb (79.4 kg) 172 lb 6.4 oz (78.2 kg)        PHYSICAL EXAM:  Vitals: Reviewed, see nursing chart  Constitutional:  No acute distress, Non-toxic appearance.  HEENT: Normocephalic, Atraumatic, Bilateral external ears normal, Oropharynx moist, Nose normal.  Eyes normal  Neck: Normal range of motion, No JVD, carotid upstrokes are preserved without audible bruits.  Cardiovascular: tachycardic rate and rhythm.  Normal S1 and S2. No rubs, gallops, or murmurs.  Lungs:  Clear to auscultation.  GI: Bowel sounds normal, Soft, No tenderness, No masses, No pulsatile masses.  Extremities:  Normal range of motion, Intact distal pulses, No edema, No tenderness.  Neurologic:  Alert & oriented x 3, Normal motor function, Normal sensory function, No focal defecits.  Skin:  Warm, Dry, No erythema, No rash.  Psychiatric:  Normal affect and mood       Data Review    Recent Labs     12/25/22  0443 12/26/22  1230 12/27/22  0459   NA 139 142 142   K 4.0 3.8 4.2   CO2 21* 21* 19*   BUN 19 25 23    CREATININE 1.67* 1.56* 1.26*     Recent Labs     12/26/22  1230 12/27/22  0459   WBC 6.21 8.54   HGB 11.9 12.3   HCT 37.9 40.3   MCV 92.2 93.5   PLT 156 199     No results found for: CKMB, CKTOTAL, CKMBINDEX, POCCKMB, TROPONINI, POCTNI, POCTROP   EKG:      My read: sinus tachycardia with left bundle branch block    CXR:    Office Records     Personally reviewed  Assessment    Assessment:     1) chest pain   2) sinus tachycardia    Patient complaining of chest pain primarily in her right shoulder.  She also has sinus tachycardia with a stable left bundle branch block, which she has had before.  The most worrisome consideration is a pulmonary embolism as she is post up is tachycardic has had altered mentation along with this pleuritic chest pain.  Acute coronary syndrome is certainly on the differential however EKG does not show any ischemic changes.  Other acute etiologies such as tamponade are also less likely as bedside point of care ultrasound did not show any evidence of pericardial effusion.  She is thankfully hemodynamically stable.  Would recommend initial workup with troponins which are pending D-dimer in formalin TTE.  We will base additional workup based on these findings.    Plan:     1) Workup with high sensitivity troponin  2) D-dimer has returned markedly elevated at 2500 as writing this note recommended CT PE which has been ordered by her primary team  3) Recommend formal TTE to assess for change in EF or effusion    We will continue to follow    Victoria Kreyden, Walls

## 2022-12-27 NOTE — Other (Unsigned)
 Transferred patient in bed with belongings to 3034 per transport. Patient's  daughter at bedside.

## 2022-12-27 NOTE — HIE Documentation (Unsigned)
 Inpatient Physical Therapy Progress Note  S: I feel really loopy    O:  Precautions  Activity Level: Activity as tolerated  Weight Bearing Status: Weight bearing as tolerated  Other Precautions: R TKA    Cognition: Follows commands. AOx3    Bed Mobility  Sit to Stand: CGA;With Assistive Device (specify) (RW)        Gait  Pattern: Decreased Cadence;Decreased heel strike L;Decreased heel strike  R;Step-through  Gait Assistance: CGA  Assistive Device: Rolling walker  Distance (ft): 20 Feet  Additional Comments: groggy during amb, distance limited by pain        Interventions/Exercises  Knee ROM/ Strengthening: Flexion;Extension  Knee ROM/ Strengthening Reps: 15  Balance Training (sitting): Sitting Tolerance;Sitting reaching   activities;Weight  shifting anterior/posterior  Balance Training (standing): Standing Tolerance;With bilateral UE  support;Standing weight shift all planes  Functional Activity Tolerance: Endurance does limit participation in activity  Endurance does limit participation: Rest breaks with gait;Rest breaks with  exercise;Medical status limits tolerance            Pain Information      Recommendations  PT Recommendations  PT Recommendations: Short-term skilled PT  PT Equipment Recommended: Defer to next level of care    A: Pt tolerated treatment poorly this date. She was very groggy at start of  session. Daughter entered during session which assisted with alertness of pt.   Pt  only able to amb very short household distances this date d/t pain and  grogginess. Cues needed to have her keep eyes open. The patient will benefit  from continued skilled therapy to progress with strength, activity tolerance,  safety and independence with functional mobility.    Short term goals to be met by 12/31/22- progressing  Pt will perform supine <> sit, supervision  Pt will perform sit <>stand supervision  Pt will ambulate 150 ft with LRAD, SBA  Pt will ascend/descend 2 steps with 2 HR, SBA.  Pt will perform 10-15  BLE exercises with minimal cueing.    P: Plan to continue with PT POC progressing activity as tolerated.  Let this serve as D/C summary if patient D/C prior to next session.    At end of session pt in chair with alarm activated. Needs within reach.      Therapist: Luken, Abigail, PT, DPT  Date: 12/27/2022

## 2022-12-27 NOTE — HIE Documentation (Unsigned)
 [facility]  Rapid Response Team      S: Jodi Walls is an 82 y.o. female on hospital day 0. The Rapid Response  Team was called to the patient's bedside for chest pain.    B: The patient was admitted for a total knee replacement. The patient has a  pertinent history of breast cancer, GERD, HLD, HTN, and RA. The patient is  admitted to the orthopedic surgery service. The cardiology service is not  currently following the patient.    A: Upon our arrival, the patient was lying in the recliner with her eyes   closed.  Pt states her chest pain is a 10/10 and indicates the chest pain is substernal  and in her neck and back. Pt states that the pain improves when she lifts her  head up to a neutral position. Pt is A&O x3 and satting 96% on room air. An   EKG,  portable CXR and laboratory evaluation was ordered per protocol.      Vitals:   12/27/22 0324 12/27/22 0743 12/27/22 1140 12/27/22 1322  BP: 135/78 134/79 137/84 128/83  Pulse: 83 79 (!) 104 (!) 105  Resp: 16 12 18 16   Temp: (!) 97.4 ?F (36.3 ?C) (!) 97.5 ?F (36.4 ?C) (!) 97.5 ?F (36.4 ?C) 98.3   ?F  (36.8 ?C)  SpO2: 99% 99% 100% 98%        R: Our team spoke with the patient's primary team of record, bedside RN Gwen   and  Dr. Ninfa Hurdle, MD, to make them aware of the current situation. The EKG  was interpreted by Dr. Jonell of the cardiology service, who recommended no  STEMI. Further care of this patient will be assumed by the primary service   with  cardiology consultation, if not already engaged and/or if requested by the  primary attending.      Plan/Disposition: The patient is to transfer to [facility] at this time. I have  discussed the plan of care with patient and patient's RN, no further questions  at this time. I have also discussed with patient and patients nurse to contact  the Rapid Response Team if there is an increase in symptoms or a change in  condition. The patient will be added to the Rapid Response Team Watcher List   at  this  time.    Predictive Model Details    Low Risk  Factor Value  Calculated 12/27/2022 13:58 48% Age 20 years old  Deterioration Index Model 28% Neurological exam X   12% Cardiac rhythm ST   10% Pulse 105   4% Sodium 143 mmol/L   3% Systolic 128   2% WBC count 8.54 10*3/uL   1% Potassium 4.1 mmol/L   0% Pulse oximetry 98 %   0% Hematocrit 40.3 %   0% Respiratory rate 16   0% Temperature 98.3 ?F (36.8 ?C)      Julie E. Jarvis, RN  12/27/2022 1:58 PM  The [facility] Rapid Response Team    The Rapid Response Team can be reached by calling the Safety Operations Center  at 119 at any time.

## 2022-12-27 NOTE — Other (Unsigned)
 D: Patient states she is having chest pain while breathing  A: STAT team call placed at 1141

## 2022-12-27 NOTE — Other (Unsigned)
 D: Patient has transfer to step down orders  A: Called report to [employer]  R; verbalized understanding

## 2022-12-27 NOTE — HIE Documentation (Unsigned)
 Subjective:    Post-Operative Day: 3 Status Post right total knee arthroplasty  Systemic or Specific Complaints:  patient became more sedated after seeing her  yesterday morning.  MRI head performed negative for acute abnormality.  Gabapentin  in chart as a home med taking daily however it is unclear if she   had  been taking daily at home per family.  Gabapentin  d/cd.  Oxycodone  dosage  decreased.  Still with some sedation this morning.  Awakens when prompted and  answers simple questions but tends to fall back asleep.  Has been getting up   to  commode overnight with RN.  No c/o CP, SOB, or nausea.    Objective:    Patient Vitals for the past 24 hrs:   BP Temp Temp src Pulse Resp SpO2  12/27/22 0324 135/78 (!) 97.4 ?F (36.3 ?C) Axillary 83 16 99 %  12/26/22 2341 -- -- -- -- -- 99 %  12/26/22 2249 132/77 (!) 97.5 ?F (36.4 ?C) Oral 79 16 97 %  12/26/22 1650 147/69 (!) 97.3 ?F (36.3 ?C) Axillary 67 12 100 %  12/26/22 1117 148/67 (!) 97.4 ?F (36.3 ?C) Axillary 62 14 100 %  12/26/22 0738 129/61 (!) 97.5 ?F (36.4 ?C) Oral 63 14 98 %      General: alert, cooperative, no distress, appears stated age  Wound: Dressing c/d/i  Motion: NVI Ankle plantarflexion = dorsiflexion  DVT Exam: No evidence of DVT seen on physical exam.  Negative Homan's sign.    Data Review  CBC:  Lab Results  Component Value Date   WBC 8.54 12/27/2022   RBC 4.31 12/27/2022   HGB 12.3 12/27/2022   HCT 40.3 12/27/2022   PLT 199 12/27/2022      Assessment:    Status post right total knee arthroplasty.  CAD  CRF  GERD  HTN  HLP  RA  Thyroid d/o  AMS    Plan:    Continues current post-op course  PT/OT  IS/ankle pumps  WBAT  Encourage oral fluid intake  Aspirin  for DVT prophylaxis.  SCDs, TED hose.  Discussed pain management with patient.  Continue current regimen.  AMS likely due to combined gabapentin  with oxycodone  dosing.  Limit narcotics.  Tylenol  q 6hrs.  Gabapentin  d/cd.  Will plan transfer to SNF when when stable medically.

## 2022-12-27 NOTE — HIE Documentation (Unsigned)
 D:  Patient transferred from [facility] to room 3034 for CP.  Last documented EF on  file = 46% (12/03/2022).  Pt A&Ox2-3, extremely drowsy.  VSS.  Pt placed on  monitor, ST (105) per tele monitor, confirmed by CMR.  Denies SOB, on RA, SPO2   =  98%.  c/o generalized all over pain.  PRN pain meds per [facility]    A:  Pt oriented to room and use of call light, daughter at bedside.    Assessment  complete and charted.  Discussed POC.  Reinforced safety/fall education.  Instructed Pt to use call light as needs arise.    R:  Patient verbalized understanding of POC.  Call light within reach.  Hourly  rounding in process.  Will continue to monitor

## 2022-12-27 NOTE — HIE Documentation (Unsigned)
 Subjective:    C/c status post RTKA/hypothyroid/CAD/GERD    Interval history - Patient seen and examined at the bedside. Interval labs,  cultures and imaging reviewed as indicated below. Additions to the chart  reviewed. D/w staff in detail.  Now more alert. Patient is able to answer   simple  questions and follow commands. Tolerated IV fluids - IV hydrocortisone -  creatinine trended down from 1.5 to 1.2. reported episode of chest pain.      Past Medical History:  Diagnosis Date   Arthritis   left knee OA   Back problem   s/p surgery   Breast cancer (CMS HCC)   treated with radical mastectomy   Cardiomyopathy (CMS HCC)   Coronary artery disease   CRF (chronic renal failure)   stage 3; monitored; nephrologist - Dr. Marene   GERD (gastroesophageal reflux disease)   Hiatal hernia   Hx of blood clots 2002   hand/arm post surgery   HX OTHER MEDICAL   NO BP or VENIPUNCTURE RIGHT ARM; s/p mastectomy   Hyperlipidemia   Hypertension   Kidney stones   passed   LBBB (left bundle branch block)   Nausea & vomiting   Neck problem   s/p surgery   Postoperative nausea and vomiting   RA (rheumatoid arthritis) (CMS HCC)   Thyroid disorder   hypo    Past Surgical History:  Procedure Laterality Date   HX BACK SURGERY   HX CATARACT REMOVAL WITH IMPLANT   HX CHOLECYSTECTOMY   HX COLONOSCOPY   HX DENTAL SURGERY   HX FINGER TRIGGER RELEASE Left   HX HYSTERECTOMY   HX MASTECTOMY   right mastectomy with breast implant   HX NECK SURGERY   HX TOTAL KNEE ARTHROPLASTY Left   PR APPENDECTOMY   PR ARTHROPLASTY KNEE HINGE PROSTHESIS Right 12/24/2022   Right Total Knee Arthroplasty performed by Omar Belvie MATSU., MD at [facility]   PR ARTHRP KNE CONDYLE&PLATU MEDIAL&LAT COMPARTMENTS  06/03/2013   lt    Current Facility-Administered Medications  Medication Dose Route Frequency Provider Last Rate Last Admin   acetaminophen  (TYLENOL ) tablet 1,000 mg  1,000 mg Oral Q6H Mia Camellia PARAS.,  PA   1,000 mg at 12/27/22 0502   aspirin  chewable  tablet 81 mg  81 mg Oral BID Mia Camellia PARAS., PA   81 mg   at  12/27/22 9141   diphenhydrAMINE  (BENADRYL ) capsule 25 mg  25 mg Oral Q6H PRN Mia Camellia PARAS.,  PA   (Held by provider) furosemide  (LASIX ) tablet 40 mg  40 mg Oral Daily   Krimerman,  Naum S., MD   40 mg at 12/25/22 0816   levothyroxine  (SYNTHROID ) tablet 100 mcg  100 mcg Oral Daily Washnock, Eric   J.,  PA   100 mcg at 12/27/22 0557   morphine 4 mg/mL injection 2-4 mg  2-4 mg Intravenous Q2H PRN Mia Camellia PARAS., PA   ondansetron  (PF) (ZOFRAN ) injection 4 mg  4 mg Intravenous Q6H PRN Mia Camellia PARAS., PA   4 mg at 12/26/22 1222   ondansetron  (ZOFRAN ) tablet 4 mg  4 mg Oral Q6H PRN Mia Camellia PARAS., PA   oxyCODONE  (ROXICODONE ) immediate release tablet 2.5 mg  2.5 mg Oral Q4H PRN  Krimerman, Naum S., MD   potassium chloride  (KDUR) tablet 10 mEq  10 mEq Oral BID Mia Camellia PARAS.,   PA  10 mEq at 12/27/22 0858   predniSONE  (DELTASONE ) tablet 5  mg  5 mg Oral QAM Mia Camellia PARAS., PA   5   mg  at 12/27/22 0858   saline lock 0.9 % flush 3-10 mL  3-10 mL Intravenous PRN Mia Camellia PARAS.,   PA   sodium chloride  0.9% IV solution  100 mL/hr Intravenous Continuous Krimerman,  Naum S., MD 100 mL/hr at 12/27/22 0220 Rate Verify at 12/27/22 0220   sodium chloride  infusion 0.9% flush bag  30 mL Intravenous PRN Mia Camellia PARAS., PA   tamsulosin [facility]) capsule 0.4 mg  0.4 mg Oral Daily PRN Mia Camellia PARAS.,   PA  0.4 mg at 12/27/22 0502    Allergies  Allergen Reactions   Ace Inhibitors    cough   Atenolol     Bradycardia (HR 40's)   Codeine Nausea And Vomiting   Lipitor (Atorvastatin)    Muscle aches    Social History    Tobacco Use   Smoking status: Never    Passive exposure: Never   Smokeless tobacco: Never  Substance Use Topics   Alcohol use: No    Family History  Problem Relation Name Age of Onset   Heart Problems Father       bypass surgery   Anesthesia Complications Neg Hx      Review of Systems  A complete review of systems was performed  including:    General ROS:no night sweats, no fever, no chills  Ophthalmic ROS:  no eye pain, no visual changes  ENT ROS no sinus congestion  Hematological and Lymphatic ROS: no swollen lymph nodes  Respiratory ROS:  no shortness of breath  Cardiovascular ROS:no chest pain, no palpitations  Gastrointestinal ROS: no nausea/vomiting , no abdominal pain,  no melena  Genito-Urinary ROS:no hematuria, no dysuria  Musculoskeletal ROS:no joint swelling  Neurological ROS:no numbness or tingling of fingers or toes  Dermatological ROS:no rash  Endocrine ROS: no unexpected weight changes.      All other systems reviewed are negative/except as posted per ROS and HPI      Objective:        Intake/Output Summary (Last 24 hours) at 12/27/2022 0959  Last data filed at 12/27/2022 0617  Gross per 24 hour  Intake 1205.78 ml  Output 1400 ml  Net -194.22 ml      BP 134/79   Pulse 79   Temp (!) 97.5 ?F (36.4 ?C) (Oral)   Resp 12   Ht 5'  (1.524 m)   Wt 172 lb 6.4 oz (78.2 kg)   SpO2 99%   BMI 33.67 kg/m?  General appearance: alert, cooperative  Head: Normocephalic, without obvious abnormality, atraumatic  Eyes: conjunctivae/corneas clear. PERRL  Throat: abnormal findings: dentition: poor  Neck: supple, symmetrical, trachea midline, no adenopathy and thyroid: not  enlarged  Lungs: clear to auscultation bilaterally  Heart: regular rate and rhythm, S1, S2  no gallop or rub  Abdomen: soft, non-tender. Bowel sounds normal. No masses,  no organomegaly  Extremities: post op R knee pain and edema  Pulses: 2+ and symmetric  Skin: Skin color, texture, turgor normal. No rashes or lesions  Lymph nodes: Cervical, supraclavicular, and axillary nodes normal.  Neurologic: Cranial nerves appear to be intact 2-12, DTR 2/4. No sensory  deficit.      ECG:    Data Review:  Recent Labs    12/26/22  1230 12/27/22  0459  WBC 6.21 8.54  HGB 11.9 12.3  HCT 37.9 40.3  MCV 92.2 93.5  PLT 156 199  Recent Labs    12/25/22  0443 12/26/22  1230 12/27/22  0459  NA  139 142 142  K 4.0 3.8 4.2  CO2 21* 21* 19*  BUN 19 25 23   CREATININE 1.67* 1.56* 1.26*    Lab Results  Component Value Date   GLU 93 12/27/2022   GLU 105 (HIGH) 12/26/2022   GLU 157 (HIGH) 12/25/2022    Lab Results  Component Value Date   ALB 4.0 12/10/2022    No results found for: INR, PROTIME  No results found for: IRON, TIBC, FERRITIN  Lab Results  Component Value Date   MG 1.9 08/15/2018    No results found for: FT3, FREET4, TSH  No results found for: PHENYTOIN, PHENOBARB, VALPROATE, CBMZ  No results found for: VITAMINB12  No results found for: FOLATE  Lab Results  Component Value Date   HGBA1C 5.4 12/10/2022    No results found for: CKTOTAL, CKMB, CKMBINDEX, TROPONINI  Recent Labs    12/25/22  0443 12/26/22  1230 12/27/22  0459  GLU 157* 105* 93    Radiology review:  MRI-HEAD W/O CONTRAST  Final Marteze Vecchio by Interface, Incoming Radiology Results (08/28 1636)  IMPRESSION:    1.  No acute stroke, mass, or hemorrhage.  2.  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    Electronically signed by Morene Daring, MD            Assessment and Plan:    Status post R TKA.  RA on chronic steroids/chronic steroid use.  HTN  HLD  Hypothyroidism  Chronic diastolic CHF  HLD  HTN  New onset of the hypotension.  MS change.    Plan.  Check MRI of the head - negative for CVA  D/c IV fluids NS 100 ml/h.  Status post IV solucortef once.  PPI bid.  ECG/tele monitoring 2D ECHO/serial enzymes.  D/c gabapentin .  Continue on Oxy IR to 2.5 mg to q2 hours prn.  Hypothyroidism - will continue synthroid  as per current schedule and modify  accordingly.  Continue on lasix  with hold criteria.  Continue on home prednisone  5 mg daily - monitor for hypotension.  Ongoing K supplementation and monitoring.                      Signed:  Ninfa CANDIE Burows, MD  12/27/2022  9:59 AM

## 2022-12-27 NOTE — HIE Documentation (Unsigned)
 Latest Reference Range & Units 12/27/22 13:46  HS Troponin I, Baseline 0 - 3 ng/L 2,455 (HH)  (HH): Data is critically high     Latest Reference Range & Units 12/27/22 13:46  D-Dimer, Quant 0 - 230 ng/mL DDU 2,492 (HIGH)  (HIGH): Data is abnormally high      Results given to Dr Kreyden via secure chat, obtained orders for CTA Chest and  STAT EKG.

## 2022-12-28 NOTE — OR Surgeon (Signed)
 Brief Op Note    Date: 12/28/2022    Surgeons and Role:     * Walls Jodi SAUNDERS., MD - Primary    Staff: Radiology Technologist: Climmie Elspeth RAMAN, RT  Primary Scrub Person: Joli Chroman, RN  Monitor: Sigurd Longs, RN  Circulator: Jackquline Crock, RN    Pre-op Diagnosis: NSTEMI (non-ST elevated myocardial infarction) (CMS HCC) [I21.4]    Post-op Diagnosis: NSTEMI (non-ST elevated myocardial infarction) (CMS HCC) [I21.4]    Variation from Planned Procedure: none       Findings: Same as Pre-op Diagnosis.    Procedure(s):        LEFT HEART CATH w/[facility] [id number]  INTERVENTION - CORONARY [92920]    No significant CAD  EDP 16 mm Hg      Estimated Blood Loss: less than 50 ml    Specimens:  No specimens found on the log        Jodi FABIENE Solmon, MD  [facility]  [address]  [place], [place] [address]  Phone: [phone]  Fax [phone]  [email or web address]

## 2022-12-29 NOTE — Progress Notes (Signed)
 -------------------------------------------------------------------------------  Attestation signed by Zachary Zachary RAMAN., MD at 12/29/22 1228  I have discussed, reviewed and agree with the plan as outlined and documented.  Will start her on Toprol-XL 12.5 mg daily with continuation of Entresto  24-26 mg twice daily.    Resume Lasix  at 20 mg daily tomorrow    We will sign off at this time.  Please do not hesitate to contact us  with any further questions or concerns.  She will need to follow up in the office.  -------------------------------------------------------------------------------     Cardiology Service Progress Note       I am asked to evaluate Jodi Walls by Dr. Belvie JUDITHANN Mulders, MD for Primary osteoarthritis of right knee [M17.11]    Primary Cardiologist: Selinda Sharps, MD.     HPI:     Patient is a 82 y.o. female with PMH of nonischemic cardiomyopathy, LBBB who initially presented to the hospital for right total knee replacement procedure and developed chest pain during her recovery phase on 12/27/2022 for which cardiology was consulted.       Interval History:    Stat chest pain was called on 8/29. Upon assessing patient, she did not seem like her usual self and was out of it, although answered appropriately to questions.  Nursing staff stated that for the last 2 days she has been more somnolent than normal and had MRI of her brain done which was negative for anything acute. She endorsed chest pain primarily on the right side of her chest and shoulder, with a pleuritic nature to the pain. She says that she was working with PT when she had the sudden onset of this pain. She was tachycardic with a left bundle branch block, normotensive on evaluation. Left bundle branch block is known.     Today patient was sitting up in her chair, she endorsed feeling tired, but overall looked much better.      Interval Labs/Imaging:  HST peaked 8/29 at 3525> 2266 today.   Creatinine  1.21    ------------------------------------------------------------------------------------------  Review of Systems  Listed in HPI      Objective Data:     BP 136/79   Pulse 94   Temp 98.2 F (36.8 C) (Oral)   Resp 16   Ht 5' (1.524 m)   Wt 185 lb 1.6 oz (84 kg)   SpO2 97%   BMI 36.15 kg/m     Intake/Output Summary (Last 24 hours) at 12/29/2022 0918  Last data filed at 12/29/2022 9372  Gross per 24 hour   Intake 655.4 ml   Output 1250 ml   Net -594.6 ml       Wt Readings from Last 3 Encounters:   12/29/22 185 lb 1.6 oz (84 kg)   12/20/22 174 lb 3.2 oz (79 kg)   11/30/22 177 lb (80.3 kg)     Recent Weights:    12/24/22 0724 12/28/22 0440 12/29/22 0500   Weight: 172 lb 6.4 oz (78.2 kg) 172 lb (78 kg) 185 lb 1.6 oz (84 kg)        PHYSICAL EXAM:    General: awake, alert, no acute distress  HEENT: normocephalic, atraumatic, PERRL, moist mucus membranes, no cervical lymphadenopathy   CV: RRR, S1, S2, no murmurs, rubs or gallops, no JVD, pulses palpable  Resp: non labored, air entry bilaterally, no wheezes, rhonchi or crackles  Abdomen: soft, nontender, nondistended, bowel sounds present, no guarding, no rebound tenderness, no CVA tenderness  Ext: no swelling, no edema, pulses  palpable  MSK: no gross deformities, moving all 4 extremities spontaneously. Mild LE edema.   Skin: warm, dry, no ecchymosis  Neuro: CN2-12 grossly intact, moving all 4 extremities spontaneously  Psych: pleasant, cooperative     Data Review    Recent Labs     12/27/22  1812 12/28/22  0724 12/29/22  0402   NA 142 139 136   K 4.2 4.1 4.3   CO2 19* 16* 18*   BUN 24 24 24    CREATININE 1.19 1.21* 1.13     Recent Labs     12/27/22  1812 12/28/22  0441 12/29/22  0402   WBC 8.64 10.31 6.88   HGB 11.8 11.1* 10.7*   HCT 37.5 35.2 33.3*   MCV 90.8 91.4 89.8   PLT 198 199 151      No results found for: CKMB, CKTOTAL, CKMBINDEX, POCCKMB, TROPONINI, POCTNI, POCTROP     ECG:   Results for orders placed or performed during the hospital  encounter of 12/24/22   ECG    Narrative                                 [facility]                                                                                                                                                                    Test Date:    2022-12-27 15:02:07  Pat Name:     [facility]            Department:   3 [facility]  Patient ID:   [id number]                 Room:         3034  Gender:       Female                   Technician:   [id number]  DOB:          03-24-1941               Requested By: NINFA CYNDIA EDISON  Order Number: [id number]                Reading MD:   Sherwood RAMAN. Rea, MD                                   Measurements  Intervals                              Axis  Rate:         101                      P:            43  PR:           155                      QRS:          19  QRSD:         139                      T:            147  QT:           418                                      QTc:          542                                                                 Interpretive Statements  Sinus tachycardia  Left bundle branch block  No significant difference from previous tracing  Electronically Signed On 12-27-2022 15:48:39 EDT by Sherwood RAMAN. Rea, MD       Imaging: reviewed      ECHO:  Limited 2D Echocardiogram 12/27/2022  Study Conclusions     Left ventricle:  Akinesis of the entireanteroseptal and apical walls. The  ejection fraction is 32%.     CATH: 12/28/2022  Diagnostic  Dominance: Right  Left Main   The vessel was visualized by angiography, is large and is angiographically normal. Distally divides into LAD and LCX      Left Anterior Descending   The vessel was visualized by angiography and is moderate in size. The vessel exhibits minimal luminal irregularities. Tortuous LAD. Diagonals are small caliber vessels without severe disease. No severe disease noted throughout LAD      Left Circumflex   The vessel was visualized by angiography and is moderate in size.  The vessel exhibits minimal luminal irregularities. OM1 is a medium caliber vessel without severe disease.      Right Coronary Artery   The vessel was visualized by angiography, is moderate in size and is angiographically normal. 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       Assessment:     Principal Problem:    Primary osteoarthritis of right knee  Active Problems:    Chest pain, unspecified type    Status post right knee replacement    S/P knee replacement    Chronic diastolic (congestive) heart failure (CMS HCC)    Acquired hypothyroidism    Current chronic use of systemic steroids    Rheumatoid arthritis, involving unspecified site, unspecified whether rheumatoid factor present (CMS HCC)    Hypotension due to hypovolemia  Altered mental status, unspecified altered mental status type    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  She does appear to be making a small improvement.  GDMT was initiated yesterday with Entresto  we will add on a low-dose beta-blocker today with metoprolol 12.5 mg daily as she has previously had bradycardia with atenolol .    Plan:     1) for her stress-induced cardiomyopathy her reduced ejection fraction of 32% we will continue to recommend GDMT titration-she is currently on Entresto  we will start low-dose metoprolol succinate at 12.5 mg daily given her bradycardia with atenolol , can up titrate as tolerated  2) treatment of pulmonary emboli per primary team  3) will need outpatient cardiology follow-up as well as repeat echocardiogram after a few months     Victoria Kreyden, [place]  Cardiology fellow        Will discuss with attending  physician.

## 2023-01-01 NOTE — Progress Notes (Signed)
-------------------------------------------------------------------------------    Attestation signed by Wilkinson, Madelyn, PT, DPT at 01/02/23 [phone]  I have personally read and reviewed this note and agree with the treatment rendered and documentation by the PTA.    Madelyn Wilkinson, DPT    -------------------------------------------------------------------------------    Inpatient Physical Therapy Progress Note    Chair alarm activated at end of session.     S:  I want to get dressed to go    O:  Precautions  Activity Level: Activity as tolerated  Weight Bearing Status: Weight bearing as tolerated  Joint Precautions: Other (comment) (no pillow under knees)  Cardiac: Telemetry  Other Precautions: 82 y/o female admitted for R TKA, now s/p L heart cath    Bed Mobility  Supine to Sit: Unable to assess (pt sitting up in recliner)  Sit to Supine: Unable to assess (pt sitting up in recliner)  Sit to Stand: CGA;With Assistive Device (specify);1 assist (STS x 1 from recliner to Rw)    Balance  Standing-Static: Fair- - Maintains balance with CGA  Standing-Dynamic: Fair- - Maintains balance through minimal excursions of active trunk movement with CGA    Coordination  Coordination: No gross deficits noted  Coordination comments: Functional    Cognition  Overall Cognitive Status: Does not interfere with functional mobility or ADL    Interventions/Exercises  Balance Training (standing): Standing Tolerance;Weight shifting laterally;With unilateral UE support (Pt able to static stand to RW to assist with clothing management, Min A x 1 with unilateral UE support.)  Functional Activity Tolerance: Endurance does limit participation in activity  Endurance does limit participation: Rest breaks with gait;Rest breaks with exercise    A: Upon entry chair alarm was going off and pt wanting to get dressed to leave. Pt has d/c order in for 3:00 pm. Pt requires Min A x 1 for clothing management. Pt comfortable at end of session. Pt declined  walking this session.     Short term goals to be met by 01/07/2023  progressing, none met goals updated to 01/07/2023  Pt will perform supine <> sit, supervision - progressing  Pt will perform sit <>stand supervision - progressing   Pt will ambulate 150 ft with LRAD, SBA - progressing   Pt will ascend/descend 2 steps with 2 HR, SBA. - not seen  Pt will perform 10-15 BLE exercises with minimal cueing. - progressing     P: Plan to continue with PT POC progressing activity as tolerated.    Pt sitting up in recliner at end of session. Call light in reach in reach.     Recommendations  PT Recommendations  PT Recommendations: Short-term skilled PT  PT Equipment Recommended: Defer to next level of care      Therapist: Sims Bouchard, PTA  Date: 01/01/2023

## 2023-01-01 NOTE — Unmapped External Note (Signed)
 Pt discharged to [facility], transported via family member. IV and tele monitor removed. Report called to Rosina, RN. AVS printed, reviewed and copy given to patient. Paper work for facility given to daughter. All questions and concerns addressed at this time.

## 2023-01-01 NOTE — Discharge Summary (Signed)
 Physician Discharge Summary     Patient ID:  Jodi Walls  [id number]  82 y.o.  May 18, 1940    Admit date: 12/24/2022    Discharge date and time: No discharge date for patient encounter.     Admitting Physician: Belvie JUDITHANN Mulders, MD     Discharge Physician: Ninfa CANDIE Burows, MD      Admission Diagnoses: Primary osteoarthritis of right knee [M17.11]    Discharge Diagnoses: status post TKR/NSTEMI/acute pulmonary embolis/broken heart syndrome.     Admission Condition: serious    Discharged Condition: stable    Hospital Course: C/c status post RTKA/hypothyroid/CAD/GERD     Patient is a 82 y.o.  female presents with status post R TKA. Onset of symptoms was gradual several months ago with gradually worsening course since that time. The pain is located in the post op area of the R knee. Patient describes the pain as continuous and rated as moderate and localized. It has been associated with activity. Denies having any chest pain, sob, fever, chills, nausea, vomiting, abdominal pian, hematuria, hematemesis, hemoptysis.  Symptoms are aggravated by activity. Symptoms improve with rest. Past history includes Breast CA with radical mastectomy/CAD/CKD3/HLD/renal stones/HLD/HTN/LBB/RA on chronic prednisone  and see below.  Previous studies include see below.    ECHO - 12/27/22  Left ventricle:  Akinesis of the entireanteroseptal and apical walls. The  ejection fraction is 32%.     Assessment and Plan:      Status post R TKA.   RA on chronic steroids/chronic steroid use.   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  HLD  HTN  New onset of the hypotension.   MS change.   NSTEMI  Acute pulmonary emboli.      Plan.   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 - GDMT given with toprol/entresto .   CT chest with pulmonary emboli - no RH strain - Eliquis  10 mg bid with  transition to 5 mg bid lifetime due to 3 thromboembolic events/combined.   PPI to daily.   D/c gabapentin .   Continue on Oxy IR to 2.5 mg to q2 hours prn.   Hypothyroidism - will continue synthroid  as per current schedule and modify accordingly.    Continue on lasix  with hold criteria.   Continue on home prednisone  5 mg daily - monitor for hypotension.   Ongoing K supplementation and monitoring.        Consults: cardiology and orthopedic surgery    Data Review:  Recent Labs     12/30/22  0306 12/31/22  0715 01/01/23  0513   WBC 5.97 6.63 6.24   HGB 10.3* 10.6* 11.6*   HCT 35.5 34.1* 35.5   MCV 95.7 90.9 90.3   PLT 174 213 214     Recent Labs     12/30/22  0306 12/31/22  0715 01/01/23  0513   NA 138 138 138   K 4.4 4.2 4.0   CO2 18* 20* 19*   BUN 25 20 18    CREATININE 1.23* 1.26* 1.24*     Lab Results   Component Value Date    GLU 95 01/01/2023    GLU 98 12/31/2022    GLU 102 (HIGH) 12/30/2022     Lab Results   Component Value Date    ALB 4.0 12/10/2022     Lab Results  Component Value Date    INR 1.0 12/27/2022    PROTIME 12.3 12/27/2022     No results found for: IRON, TIBC, FERRITIN  Lab Results   Component Value Date    MG 1.9 08/15/2018     No results found for: FT3, FREET4, TSH  No results found for: PHENYTOIN, PHENOBARB, VALPROATE, CBMZ  No results found for: VITAMINB12  No results found for: FOLATE  Lab Results   Component Value Date    HGBA1C 5.4 12/10/2022     No results found for: CKTOTAL, CKMB, CKMBINDEX, TROPONINI  Recent Labs     12/30/22  0306 12/31/22  0715 01/01/23  0513   GLU 102* 98 95     Radiology review:   CT-ANGIOGRAPHY CHEST W/WO   Final Result by Rebecka, Incoming Radiology Results (08/29 1818)   IMPRESSION:       1. Right lower lobe and middle lobe subsegmental branch pulmonary thromboembolus appreciated as described above without any central or main pulmonary artery clots appreciated.      2. Bilateral pleural effusions and lung base atelectasis with some  hazy interstitial or groundglass changes in the lungs which could be related to pulmonary edema or pneumonitis. No additional segmental pneumonia      3. Right breast implant noted with large retrocardiac hiatal hernia appreciated      ----------PERT DATA----------      Data reported only if pulmonary embolism is present:      Most central extent of clot:   Sub-segmental branches of the right middle lobe and lower lobe      RV/LV ratio:   Normal      ----------PERT DATA----------      Electronically signed by Aleene PARAS Munschauer, DO      DIAG-PORTABLE CHEST   Final Result by Interface, Incoming Radiology Results (08/29 1240)   IMPRESSION:      Hiatal hernia is stable. Mild interstitial pulmonary edema. Correlate for history of fluid overload or mild congestive heart failure.      Electronically signed by Debby Daring, MD      MRI-HEAD W/O CONTRAST   Final Result by Interface, Incoming Radiology Results (08/28 1636)   IMPRESSION:      1.  No acute stroke, mass, or hemorrhage.   2.  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      Electronically signed by Morene Daring, MD            Discharge Exam:  BP 122/63   Pulse 73   Temp 97.7 F (36.5 C) (Oral)   Resp 16   Ht 5' (1.524 m)   Wt 168 lb 4.8 oz (76.3 kg)   SpO2 100%   BMI 32.87 kg/m   General:  Alert, cooperative, no distress, appears stated age.   Head:  Normocephalic, without obvious abnormality, atraumatic.   Eyes:  Conjunctivae/corneas clear. PERRL, EOMs intact. Fundi benign.   Ears:  Normal TMs and external ear canals both ears.   Nose: Nares normal. Septum midline. Mucosa normal. No drainage or sinus tenderness.   Throat: Lips, mucosa, and tongue normal. Teeth and gums normal.   Neck: Supple, symmetrical, trachea midline, no adenopathy, thyroid: no enlargment/tenderness/nodules, no carotid bruit and no JVD.   Back:   Symmetric, no curvature. ROM normal. No CVA tenderness.   Lungs:   Clear to auscultation bilaterally.   Chest wall:  No  tenderness or deformity.   Heart:  Regular rate and rhythm, S1, S2 normal, no  murmur, click, rub or gallop.   Breast Exam:  No tenderness, masses, or nipple abnormality.   Abdomen:   Soft, non-tender. Bowel sounds normal. No masses,  No organomegaly.   Genitalia:  Normal female without lesion, discharge or tenderness.   Rectal:  Normal tone, no masses or tenderness. Guaiac negative stool.   Extremities: Extremities normal, atraumatic, no cyanosis, post op pain, edema.   Pulses: 2+ and symmetric all extremities.   Skin: Skin color, texture, turgor normal. No rashes or lesions.   Lymph nodes: Cervical, supraclavicular, and axillary nodes normal.   Neurologic: CNII-XII intact. Normal strength, sensation and reflexes throughout.       Disposition: SNF    Patient Instructions:      Medication List        START taking these medications      * apixaban  5 mg Tab  Commonly known as: ELIQUIS   Take 2 Tablets (10 mg) by mouth 2 times daily for 6 doses.     * apixaban  5 mg Tab  Commonly known as: ELIQUIS   Take 1 Tablet (5 mg) by mouth 2 times daily.  Start taking on: January 04, 2023     metoprolol succinate 25 mg XL tablet  Commonly known as: TOPROL  Take 0.5 Tablets (12.5 mg) by mouth daily.  Start taking on: January 02, 2023     pantoprazole  40 mg Tbec  Commonly known as: PROTONIX   Take 1 Tablet (40 mg) by mouth daily.  Start taking on: January 02, 2023     rosuvastatin 10 mg Tab  Commonly known as: CRESTOR  Take 1 Tablet by mouth daily.  Start taking on: January 02, 2023     sacubitriL -valsartan  24-26 mg Tab  Commonly known as: ENTRESTO   Take 1 Tablet by mouth 2 times daily.     senna 8.6 mg  Commonly known as: SENOKOT  Take 2 Tablets by mouth daily.  Start taking on: January 02, 2023          * * This list has 2 medication(s) that are the same as other medications prescribed for you. Read the directions carefully, and ask your doctor or other care provider to review them with you.                CHANGE how you take  these medications      aspirin  81 mg Tbec  Take 1 Tablet (81 mg) by mouth 2 times daily. May resume home daily dosing following 30 days of twice daily dosing.  What changed:   when to take this  additional instructions     * oxyCODONE  5 mg immediate release tablet  Commonly known as: ROXICODONE   Take 0.5 Tablets (2.5 mg) by mouth every 4 hours as needed for up to 5 days.  What changed:   how much to take  when to take this  reasons to take this     * oxyCODONE  5 mg immediate release tablet  Commonly known as: ROXICODONE   Take 0.5 Tablets (2.5 mg) by mouth every 2 hours as needed for up to 5 days.  What changed: You were already taking a medication with the same name, and this prescription was added. Make sure you understand how and when to take each.          * * This list has 2 medication(s) that are the same as other medications prescribed for you. Read the directions carefully, and ask your doctor or other care provider to review them  with you.                CONTINUE taking these medications      cholecalciferol 10 mcg (400 unit) Tab  Commonly known as: VITAMIN D-3     cyanocobalamin  (Vitamin B-12) 100 mcg Tab     FERROUS SULFATE  PO     furosemide  40 mg tablet  Commonly known as: LASIX      levothyroxine  75 mcg tablet  Commonly known as: SYNTHROID      Melatonin 3 mg Tab     potassium chloride  10 mEq tablet  Commonly known as: KDUR     predniSONE  5 mg tablet  Commonly known as: DELTASONE             STOP taking these medications      gabapentin  300 mg capsule  Commonly known as: NEURONTIN                Where to Get Your Medications        These medications were sent to [facility]  [address], [place] [address]      Hours: 7:30 to 5:30 Mon-Fri Phone: [phone]   apixaban  5 mg Tab  rosuvastatin 10 mg Tab       You can get these medications from any pharmacy    Bring a paper prescription for each of these medications  oxyCODONE  5 mg immediate release tablet  oxyCODONE  5 mg  immediate release tablet       Information about where to get these medications is not yet available    Ask your nurse or doctor about these medications  apixaban  5 mg Tab  aspirin  81 mg Tbec  metoprolol succinate 25 mg XL tablet  pantoprazole  40 mg Tbec  sacubitriL -valsartan  24-26 mg Tab  senna 8.6 mg       Activity: activity as tolerated  Diet: Regular Diet  Spent over 30 minutes coordinating discharge with social worker, counseling patient and family, communicating with other physicians, preparing discharge medications, and setting up post discharge care.   Follow-up with MD in 3 days.    Signed:  Ninfa CANDIE Burows, MD  01/01/2023  2:14 PM   *Some images could not be shown.

## 2023-01-01 NOTE — Progress Notes (Signed)
 Inpatient Occupational Therapy Progress Note    S: Per RN, okay to treat. I do feel a lot better today    O:  Precautions  Activity Level: Activity as tolerated  Weight Bearing Status: Weight bearing as tolerated  Joint Precautions:  (No pillow under knee)  Cardiac: Telemetry  Other Precautions: 82 y/o female admitted for R TKA, now s/p L heart cath    Cognition  Overall Cognitive Status: Does not interfere with functional mobility or ADL  Arousal/Alertness: Alert  Behavior: Cooperative;Pleasant;Appropriate  Attention Span: Appears intact  Orientation: Person;Place;Time;Situation  Following Commands: Follows multi-step commands w/out difficulty  Initiation/Sequencing/Organization: Appears intact  Safety Judgement: Good awareness of safety precautions  Problem Solving: Able to problem solve independently    Bed Mobility  Supine to Sit: Unable to assess (pt in chair upon entry)  Sit to Supine: Min assist to Right (assist managing RLE)  Sit to Stand: CGA;With Assistive Device (specify) (RW)  Bed to Chair: CGA;Via Ambulation (RW)  Additional Comments: mobility <> bathroom via RW with CGA    Functional Transfers  Toilet Transfers: CGA;Via ambulation (RW + GB for steadying)    ADL  Where Assessed: Bathroom  Grooming Assistance:  (CGA in stance, SUP seated sinkside)  Bathing Assist:  (CGA)  Bathing Deficit: Setup;Steadying;Verbal Cueing;Supervision/Safety (sponge bathing at sink)  LE Dressing Assist: Moderate  LE Dressing Deficit: Set-up;Steadying;Requires assistive device for steadying;Verbal cueing;Supervision/safety;Increased time to compete;Thread RLE into underwear;Thread LLE into underwear  Toileting Assist:  (CGA)  Toileting Deficit: Setup;Steadying;Verbal Cueing;Supervison/Safety;Grab Bar Use       Balance  Sitting-Static: Fair+ - Maintains balance with minimal challenges from all directions  Sitting-Dynamic: Fair - Maintains balance through minimal excursions of active trunk movement with  supervision  Standing-Static: Fair- - Maintains balance with CGA  Standing-Dynamic: Fair- - Maintains balance through minimal excursions of active trunk movement with CGA    Hand Function  Gross Grasp: Functional  Hand Coordination: WFL    Interventions/Exercises  Shoulder AROM/Strengthening: Functional Reach  Elbow AROM/Strengthening: Functional Reach  Hand strengthening: Functional Task  Fine motor training: Grooming tasks;Functional task;Opening various house hold containers  Functional Cognitive Training: Orientation;Short-term recall  ADL Retraining: Energy conservation (activity pacing)  Standing Tolerance: prn for functional mobility, prn for ADLs  Sitting tolerance: prn for sinkside ADLs, prn for LB dressing  Postural Control: Facilitation;Positioning;Trunk Mobility  Balance Training: Sitting weight shifting all planes;Sitting reaching activities;Standing reaching activities;Standing weight shifting all planes  Pt/family education: role of OT and current POC, ECON strategies for ADLs    Recommendations  OT Recommendations  OT Recommendations: Short-term skilled OT  OT Equipment Recommended:  (defer)    A: Pt tolerated OT tx well, pt agreeable to participate upon entry. Pt progressing to SBA<>CGA mobility household distances via RW. Pt completing ADLs (sponge bathing, grooming tasks) at sink, initially completing standing but transitioning to sitting d/t fatigue in stance. Pt requiring assist with LB dressing d/t RLE pain, pt educated ECON strategies for LB ADLs. At completion of therapy session, pt left with alarm active.     Goals to be met by 01/06/2023:  1.) Pt. will complete toilet transfer via ambulation w/ supervision.-progressing  2.) Pt will complete toileting tasks w/ supervision. -progressing  3.) Pt. will complete LB dressing with supervision and AE as needed. -progressing  4.) Pt. will complete safe car transfer w/ supervision. PROGRESSING (verbalized; unable to complete car simulator, as pt now on  telemetry floor)     P: Pt participated in OT 8/27-01/01/23 to  address ADLs, functional transfers, therapeutic exercise, therapeutic activity, activity adaptation, and activity tolerance. Continue per POC and with d/c recommendations for short-term skilled OT.    This note to serve as d/c summary if pt is discharged prior to next OT session.    Therapist: Denmark, Jody, OT  Date: 01/01/2023

## 2023-01-01 NOTE — Progress Notes (Signed)
-------------------------------------------------------------------------------    Attestation signed by Wilkinson, Madelyn, PT, DPT at 01/02/23 [phone]  I have personally read and reviewed this note and agree with the treatment rendered and documentation by the PTA.    Madelyn Wilkinson, DPT    -------------------------------------------------------------------------------    Inpatient Physical Therapy Progress Note    Bed and chair alarm activated at end of session.     S:  I need a new gown    O:  Precautions  Activity Level: Activity as tolerated  Weight Bearing Status: Weight bearing as tolerated  Joint Precautions: Other (comment) (No pillow under knee)  Cardiac: Telemetry  Other Precautions: 82 y/o female admitted for R TKA, now s/p L heart cath    Bed Mobility  Supine to Sit: CGA;1 assist;Needs side rails;HOB elevated  Sit to Supine: Unable to assess (pt sitting up in recliner)  Sit to Stand: CGA;With Assistive Device (specify);1 assist (STS x 1 from EOB to RW. STS x 1 from commode to RW.)    Gait  Pattern: Narrow BOS;Decreased Cadence;Decreased heel strike L;Decreased heel strike R;Decreased step length L;Decreased step length R  Gait Assistance: CGA;1 Assist  Assistive Device: Rolling walker  Distance (ft): 30 Feet  Additional Comments: Steady, no signs of buckling or LOB.    Balance  Standing-Static: Fair- - Maintains balance with CGA  Standing-Dynamic: Fair- - Maintains balance through minimal excursions of active trunk movement with CGA    Coordination  Coordination: No gross deficits noted  Coordination comments: Functional    Cognition  Overall Cognitive Status: Does not interfere with functional mobility or ADL    Interventions/Exercises  Hip ROM/ Strengthening: Flexion;Sitting  Hip ROM/ Strengthening Reps: 15  Knee ROM/ Strengthening: Flexion;Extension;Sitting  Knee ROM/ Strengthening Reps: 15  Balance Training (standing): Standing Tolerance;Weight shifting laterally;With bilateral UE  support  Functional Activity Tolerance: Endurance does limit participation in activity  Endurance does limit participation: Rest breaks with gait;Rest breaks with exercise    A: Pt agreeable to therapy, cleared with RN. Pt tolerated session well. Pt ambulated further this session with less assist and demonstrates increased endurance. Pt pleasant throughout session and does not c/o pain throughout session. Pt comfortable at end of session. The patient will benefit from continued skilled therapy to progress with strength, activity tolerance, safety and independence with functional mobility.       Short term goals to be met by 01/07/2023  progressing, none met goals updated to 01/07/2023  Pt will perform supine <> sit, supervision - progressing  Pt will perform sit <>stand supervision - progressing   Pt will ambulate 150 ft with LRAD, SBA - progressing   Pt will ascend/descend 2 steps with 2 HR, SBA. - not seen  Pt will perform 10-15 BLE exercises with minimal cueing. - progressing     P: Plan to continue with PT POC progressing activity as tolerated.    Pt sitting up in recliner at end of session. Call light in reach and all needs met.       Recommendations  PT Recommendations  PT Recommendations: Short-term skilled PT  PT Equipment Recommended: Defer to next level of care      Therapist: Sims Bouchard, PTA  Date: 01/01/2023

## 2023-01-01 NOTE — Unmapped External Note (Signed)
 Patient Name: Jodi Walls   Diagnosis: Primary osteoarthritis of right knee [M17.11]  Age: 82 y.o.  Room: 3034/1  Admit Date: 12/24/2022  PCP: [name], MD    Social Work Discharge Report:    Effective: 01/01/2023    This is subject to the patient's medical condition and Physician's orders.     Date: 01/01/2023    Time: 3:00 p.m.    To:   Destination   [facility]    [address] [place] [address]    Phone: [phone]          Call Report: [phone]    Fax Orders: [phone]       VIA: Daughter Silvano    Phone: [phone]    Patient/family are aware and in agreement with the discharge plan. Alternatives to transfer discussed.     Faxed COC, Rx's and AVS to Facility    HENS Complete?: Yes   [id number]}  Payer Source: [employer]. Pre-cert was obtained by [facility] pre-cert team     IMM Complete?: Yes (01/01/23 1315 : Shook, Cara A., LSW)    Please ensure the brown transfer envelope is sent with the patient.    Please notify Social Work of any changes in the discharge orders.    [name], [name]  [phone]

## 2023-01-03 NOTE — Unmapped External Note (Signed)
 [facility] CLARIFICATION FORM    Clinical Significance - Diagnosis Requiring Resource Utilization  CLINICAL DOCUMENTATION CLARIFICATION FORM      * PLEASE CHECK ALL KNOWN & SUSPECTED DIAGNOSES THAT APPLY  AND SPECIFY/COMMENT AS NEEDED    []    Change to IP status for continued followup S/P Knee replacement  []    Change to IP status for Chest pain evaluation_______________________  [x]    Change to IP status for continued followup S/P Knee replacement and Chest Pain evaluation________________  []    ___________________________________________________________________  []    Other (please specify): __________________________________________________    []    Unable to Determine (please comment) ____________________________________      Physician Signature__________________________________________________________(electronic signature)

## 2023-01-09 ENCOUNTER — Encounter: Payer: MEDICARE | Attending: Surgery | Primary: Family Medicine

## 2023-01-11 NOTE — Telephone Encounter (Signed)
 Holly advised and states Becky had total knee replacement and had blood clots. Advised Holly to contact patients cardiologist and/or if she was having sob to go back to ER. She states she will contact Dr. Abbey Chatters (cardiology).

## 2023-01-11 NOTE — Telephone Encounter (Signed)
 Puzzling call  Something must have happened since I last saw her  If so she is calling the wrong person/office and will need to call the people involved with this    I do not see her being on blood thinners in our records for at least 10 years  If she is having sob or a clot she needs to be seen. To the ER please

## 2023-01-11 NOTE — Telephone Encounter (Addendum)
 Patient's daughter, Silvano Gleason calling in on behalf of her mother. Silvano is calling with concerns about mother being taken off of her blood thinner medication and her mother has two blood clots and she is short of breathe. Wondering why her mother is being taken off of the blood thinner.     Please advise.     Silvano can be reached [phone].

## 2023-01-14 NOTE — Telephone Encounter (Signed)
 Trey Paula advised and verbalized understanding

## 2023-01-14 NOTE — Telephone Encounter (Signed)
 ok

## 2023-01-14 NOTE — Telephone Encounter (Signed)
 [facility] calling requesting order for PT and OT.

## 2023-01-15 ENCOUNTER — Encounter

## 2023-01-15 ENCOUNTER — Ambulatory Visit: Admit: 2023-01-15 | Discharge: 2023-01-15 | Payer: MEDICARE | Attending: Family Medicine | Primary: Family Medicine

## 2023-01-15 VITALS — BP 98/70 | HR 80 | Temp 97.80000°F | Ht 60.0 in | Wt 161.8 lb

## 2023-01-15 DIAGNOSIS — Z09 Encounter for follow-up examination after completed treatment for conditions other than malignant neoplasm: Secondary | ICD-10-CM

## 2023-01-15 MED ORDER — ONDANSETRON 4 MG PO TBDP
4 | ORAL_TABLET | Freq: Three times a day (TID) | ORAL | 0 refills | Status: DC | PRN
Start: 2023-01-15 — End: 2023-04-01

## 2023-01-15 MED ORDER — FUROSEMIDE 40 MG PO TABS
40 | ORAL_TABLET | Freq: Every day | ORAL | 2 refills | Status: DC
Start: 2023-01-15 — End: 2023-01-22

## 2023-01-15 NOTE — Progress Notes (Signed)
 Subjective:      Patient ID: Jodi Walls is a 82 y.o. female.    Chief Complaint   Patient presents with    Follow-Up from [facility] 12-24-2022 - 01-11-2023         Patient presents with:  Follow-Up from [facility]: [facility] 12-24-2022 - 01-11-2023     Recent d/c hospital     Here for the above   Here with daughters  Largely the concern is low bp on the new meds   Meds noted and reviewed with patient but largely the daughters including [name]     She had right knee surgery in August on 12/24/22  Apparently cleared through [facility] on 12/20/22    Surgery went well  she had complications following with chest pain tachycardia and cardiology consulted   Concerned for NSTEMI   No cad on cath on 12/28/22  Stress cardiomyopathy     Systolic bp well below 100 at home and 60-90's  Was at rehab after d/c from hospital on the 3rd and sent home from rehab on the 13th   At her own home now   Started to have nursing and therapy coming in today     Family stopped the crestor and metoprolol yesterday  Patient indicates feeling better off the meds  Especially the crestor as she had pbs with statin in past   No cp no sob   No fever   No abd pain  Bm and urine is well   She does have some nausea   During all this     Date of Birth:  April 26, 1941    Date of Visit:  01/15/2023     -- Pcn [Penicillins] -- Swelling    --  Face and hands swelled   -- Triamterene      --  Severe stomach pain,SOB,severe cramps throughout             her body   -- Ace Inhibitors -- Other (See Comments)    --  cough   -- Atenolol  -- Other (See Comments)    --  Bradycardia (HR 40's)   -- Atorvastatin -- Other (See Comments)    --  Muscle aches   -- Zithromax [Azithromycin] -- Swelling   -- Codeine -- Nausea And Vomiting      Current Outpatient Medications:     apixaban  (ELIQUIS ) 5 MG TABS tablet, Take 1 tablet by mouth 2 times daily, Disp: , Rfl:     rosuvastatin (CRESTOR) 10 MG tablet, Take 1 tablet by mouth daily, Disp: , Rfl:     pantoprazole  (PROTONIX ) 40 MG  tablet, Take 1 tablet by mouth daily, Disp: , Rfl:     sacubitril -valsartan  (ENTRESTO ) 24-26 MG per tablet, Take 1 tablet by mouth 2 times daily, Disp: , Rfl:     metoprolol succinate (TOPROL XL) 25 MG extended release tablet, Take 0.5 tablets by mouth daily, Disp: , Rfl:     Cyanocobalamin  (B-12) 2500 MCG TABS, Take by mouth, Disp: , Rfl:     Iron, Ferrous Sulfate , 325 (65 Fe) MG TABS, Take by mouth, Disp: , Rfl:     Docusate Sodium  (COLACE PO), Take by mouth, Disp: , Rfl:       levothyroxine  (SYNTHROID ) 100 MCG tablet, TAKE 1 TABLET BY MOUTH DAILY, Disp: 90 tablet, Rfl: 1    furosemide  (LASIX ) 40 MG tablet, Take 1 tablet by mouth daily May take an additional half tablet in the after noon daily  if needed for swelling, Disp: 30 tablet, Rfl: 2    predniSONE  (DELTASONE ) 5 MG tablet, TAKE 1 TABLET BY MOUTH DAILY (Patient taking differently: Take 1 tablet by mouth every other day), Disp: 90 tablet, Rfl: 1    potassium chloride  (KLOR-CON ) 10 MEQ extended release tablet, Take 1 tablet by mouth 2 times daily, Disp: , Rfl:     aspirin  81 MG tablet, Take 1 tablet by mouth daily, Disp: , Rfl:     No current facility-administered medications for this visit.      ----------------------------------                   01/15/23                             1037            ----------------------------------   BP:              98/70             Site:        Left Upper Arm        Position:        Sitting            Cuff Size:      Medium Adult         Pulse:             80              Temp:      97.8 F (36.6 C)       TempSrc:        Temporal           Weight: 73.4 kg (161 lb 12.8 oz)   Height:       1.524 m (5')        ----------------------------------  Body mass index is 31.6 kg/m.     Wt Readings from Last 3 Encounters:  01/15/23 : 73.4 kg (161 lb 12.8 oz)  10/26/22 : 79.8 kg (176 lb)  08/02/22 : 81.2 kg (179 lb)    BP Readings from Last 3 Encounters:  01/15/23 : 98/70  10/26/22  : 124/80  08/02/22 : 126/77              Review of Systems    Objective:   Physical Exam  Constitutional:       General: She is not in acute distress.     Appearance: She is well-developed. She is not ill-appearing or diaphoretic.   Cardiovascular:      Rate and Rhythm: Normal rate and regular rhythm.      Heart sounds: Normal heart sounds. No murmur heard.     No friction rub. No gallop.   Pulmonary:      Effort: Pulmonary effort is normal. No tachypnea, accessory muscle usage or respiratory distress.      Breath sounds: Normal breath sounds. No decreased breath sounds, wheezing, rhonchi or rales.   Lymphadenopathy:      Cervical: No cervical adenopathy.      Upper Body:      Right upper body: No supraclavicular adenopathy.      Left upper body: No supraclavicular adenopathy.   Skin:     General: Skin is warm and dry.      Coloration: Skin is not pale.   Neurological:      Mental Status: She is alert.  Assessment:   Assessment     Diagnosis Orders   1. Hospital discharge follow-up        2. Essential hypertension, benign  Basic Metabolic Panel      3. PE (pulmonary thromboembolism) (HCC)        4. Broken heart syndrome        5. NSTEMI (non-ST elevated myocardial infarction) (HCC)        6. Hypotension due to drugs            Meds are reviewed and updated with family today  Update in chart after the visit based on their current list   She has chf/ s/p nstemi  S/p knee replacement     Will adjust meds  If worse to er  She does have cardiac f/u tomorrow    Discussed and written down for the daughters the medicine changes  I told them at the time of the OV that the list printed off by computer was not correct and that they should use their copy with the changes I recommended below until they can see the cardio tomorrow    Orders Placed This Encounter   Medications    ondansetron  (ZOFRAN -ODT) 4 MG disintegrating tablet     Sig: Take 1 tablet by mouth 3 times daily as needed for Nausea or Vomiting     Dispense:   21 tablet     Refill:  0     Time spent discussion with family and patient/ hx/ exam/ multiple records reviewed/ medication education and reconciliation with family  Chart note prep 60 + min  Complex patient and hospital stay   Patient also with low bp though no acute distress at this time  Related to low EF and the current medicines     Interval Hx:    9/3 bmp noted   Cbc ok on same date   Multiple notes reviewed from the hospital stay at [facility]  In addition to   She did have a cath and no CAD noted   She did have depressed function. Cath on 12/28/22  Echo with EF of 32% on 12/27/22   She had mri head and was ok on 12/26/22  Cxr on 12/27/22 mild chf   Ct chest on 12/27/22 showed right lung PE       PLAN:   Plan    Ok to stay off the rosuvastatin   Ok to stay off the metoprolol   See Dr Felipe Horton tomorrow for further recommendations    Decrease the furosemide  to one half of the 40 mg  See me back in 3 week         Orysia Blas, MD

## 2023-01-15 NOTE — Patient Instructions (Signed)
 Ok to stay off the rosuvastatin   Ok to stay off the metoprolol   See Dr Katrinka Blazing tomorrow for further recommendations    Decrease the furosemide to one half of the 40 mg  See me back in 3 week

## 2023-01-15 NOTE — Care Coordination (Signed)
 Care Transitions Note    Initial Call - Call within 2 business days of discharge: Yes    Attempted to reach patient for transitions of care follow up. Unable to reach patient.    Outreach Attempts:   HIPAA compliant voicemail left for patient.     Spoke to [employer] after 2 IDs. Daughter is driving to pick up mom for MD visit. States mom not doing very well as she now has CHF. No idea of cause. Does not think that [facility] has called but does think it is to be [facility]. Agreed for me to call back tomorrow after the visit and told her I would call [facility].    Called [facility] and LVM for [name] for [facility]    Patient: Jodi Walls    Patient DOB: 01/24/1941   MRN: [id number]    Reason for Admission: R knee replacement  Discharge Date: 10/26/21  RURS: No data recorded  Last Discharge Facility       Date Complaint Diagnosis Description Type Department Provider    10/26/21  Rupture of implant of right breast, initial encounter Admission (Discharged) [facility] OR Buren Rochester, MD            Was this an external facility discharge? Yes. Discharge Date: 9/13. Facility Name: [facility]    Follow Up Appointment:   Patient has hospital follow up appointment scheduled within 7 days of discharge.    Future Appointments         Provider Specialty Dept Phone    01/15/2023 10:00 AM [name], MD Family Medicine [phone]    02/13/2023 2:00 PM Buren Rochester, MD Plastic Surgery [phone]            Plan for follow-up call in 2-5 days     Inocente Collet, BSN, RN   [facility] Post Acute Care Coordinator  [phone]

## 2023-01-16 ENCOUNTER — Inpatient Hospital Stay
Admission: EM | Admit: 2023-01-16 | Discharge: 2023-01-20 | Disposition: A | Discharge status: Home Health | Payer: MEDICARE

## 2023-01-16 ENCOUNTER — Emergency Department: Admit: 2023-01-16 | Payer: MEDICARE | Primary: Family Medicine

## 2023-01-16 DIAGNOSIS — N184 Chronic kidney disease, stage 4 (severe): Secondary | ICD-10-CM

## 2023-01-16 DIAGNOSIS — N179 Acute kidney failure, unspecified: Secondary | ICD-10-CM

## 2023-01-16 LAB — CBC WITH AUTO DIFFERENTIAL
Basophils %: 1.2 %
Basophils Absolute: 0.1 10*3/uL (ref 0.0–0.2)
Eosinophils %: 1.1 %
Eosinophils Absolute: 0.1 10*3/uL (ref 0.0–0.6)
Hematocrit: 37.1 % (ref 36.0–48.0)
Hemoglobin: 12.2 g/dL (ref 12.0–16.0)
Lymphocytes %: 28.3 %
Lymphocytes Absolute: 3.2 10*3/uL (ref 1.0–5.1)
MCH: 28.9 pg (ref 26.0–34.0)
MCHC: 32.9 g/dL (ref 31.0–36.0)
MCV: 87.8 fL (ref 80.0–100.0)
MPV: 9.7 fL (ref 5.0–10.5)
Monocytes %: 5.9 %
Monocytes Absolute: 0.7 10*3/uL (ref 0.0–1.3)
Neutrophils %: 63.5 %
Neutrophils Absolute: 7.1 10*3/uL (ref 1.7–7.7)
Platelets: 310 10*3/uL (ref 135–450)
RBC: 4.23 M/uL (ref 4.00–5.20)
RDW: 16.2 % — ABNORMAL HIGH (ref 12.4–15.4)
WBC: 11.2 10*3/uL — ABNORMAL HIGH (ref 4.0–11.0)

## 2023-01-16 LAB — PROCALCITONIN: Procalcitonin: 0.23 ng/mL — ABNORMAL HIGH (ref 0.00–0.15)

## 2023-01-16 LAB — TROPONIN
Troponin, High Sensitivity: 84 ng/L — ABNORMAL HIGH (ref 0–14)
Troponin, High Sensitivity: 70 ng/L — ABNORMAL HIGH (ref 0–14)

## 2023-01-16 LAB — BASIC METABOLIC PANEL
Anion Gap: 22 — ABNORMAL HIGH (ref 3–16)
BUN: 56 mg/dL — ABNORMAL HIGH (ref 7–20)
CO2: 15 mmol/L — ABNORMAL LOW (ref 21–32)
Calcium: 10.3 mg/dL (ref 8.3–10.6)
Chloride: 99 mmol/L (ref 99–110)
Creatinine: 3.3 mg/dL — ABNORMAL HIGH (ref 0.6–1.2)
Est, Glom Filt Rate: 13 — AB (ref >60)
Glucose: 119 mg/dL — ABNORMAL HIGH (ref 70–99)
Potassium: 4.6 mmol/L (ref 3.5–5.1)
Sodium: 136 mmol/L (ref 136–145)

## 2023-01-16 LAB — BASIC METABOLIC PANEL W/ REFLEX TO MG FOR LOW K
Anion Gap: 18 — ABNORMAL HIGH (ref 3–16)
BUN: 66 mg/dL — ABNORMAL HIGH (ref 7–20)
CO2: 18 mmol/L — ABNORMAL LOW (ref 21–32)
Calcium: 10.5 mg/dL (ref 8.3–10.6)
Chloride: 98 mmol/L — ABNORMAL LOW (ref 99–110)
Creatinine: 4.1 mg/dL — ABNORMAL HIGH (ref 0.6–1.2)
Est, Glom Filt Rate: 10 — AB (ref >60)
Glucose: 112 mg/dL — ABNORMAL HIGH (ref 70–99)
Potassium reflex Magnesium: 5 mmol/L (ref 3.5–5.1)
Sodium: 134 mmol/L — ABNORMAL LOW (ref 136–145)

## 2023-01-16 LAB — EKG 12-LEAD
Atrial Rate: 74 {beats}/min
P Axis: 69 degrees
P-R Interval: 152 ms
Q-T Interval: 458 ms
QRS Duration: 134 ms
QTc Calculation (Bazett): 508 ms
R Axis: 51 degrees
T Axis: 176 degrees
Ventricular Rate: 74 {beats}/min

## 2023-01-16 LAB — BLOOD GAS, VENOUS
Base Excess, Ven: -7.9 mmol/L
Carboxyhemoglobin: 1.2 %
HCO3, Venous: 16 mmol/L — ABNORMAL LOW (ref 23–29)
MetHgb, Ven: 0.6 % (ref <1.5)
O2 Sat, Ven: 48 %
PO2, Ven: <30 mmHg
TC02 (Calc), Ven: 17 mmol/L
pCO2, Ven: 26.7 mmHg — ABNORMAL LOW (ref 40.0–50.0)
pH, Ven: 7.386 (ref 7.350–7.450)

## 2023-01-16 LAB — IRON AND TIBC
Iron % Saturation: 19 % (ref 15–50)
Iron: 43 ug/dL (ref 37–145)
TIBC: 228 ug/dL — ABNORMAL LOW (ref 260–445)

## 2023-01-16 LAB — URINALYSIS WITH REFLEX TO CULTURE
Bilirubin, Urine: NEGATIVE
Blood, Urine: NEGATIVE
Glucose, Ur: NEGATIVE mg/dL
Nitrite, Urine: NEGATIVE
Protein, UA: 30 mg/dL — AB
Specific Gravity, UA: 1.013 (ref 1.005–1.030)
Urobilinogen, Urine: 1 U/dL (ref <2.0)
pH, Urine: 5.5 (ref 5.0–8.0)

## 2023-01-16 LAB — HEPATIC FUNCTION PANEL
ALT: 12 U/L (ref 10–40)
AST: 29 U/L (ref 15–37)
Albumin: 3.5 g/dL (ref 3.4–5.0)
Alkaline Phosphatase: 104 U/L (ref 40–129)
Bilirubin, Direct: 0.4 mg/dL — ABNORMAL HIGH (ref 0.0–0.3)
Bilirubin, Indirect: 0.4 mg/dL (ref 0.0–1.0)
Total Bilirubin: 0.8 mg/dL (ref 0.0–1.0)
Total Protein: 6 g/dL — ABNORMAL LOW (ref 6.4–8.2)

## 2023-01-16 LAB — TSH: TSH: 0.74 u[IU]/mL (ref 0.27–4.20)

## 2023-01-16 LAB — LIPASE: Lipase: 18 U/L (ref 13.0–60.0)

## 2023-01-16 LAB — PROTIME-INR
INR: 1.98 — ABNORMAL HIGH (ref 0.85–1.15)
Protime: 22.6 s — ABNORMAL HIGH (ref 11.9–14.9)

## 2023-01-16 LAB — MICROSCOPIC URINALYSIS
Bacteria, UA: NONE SEEN /HPF
Epithelial Cells, UA: 10 /HPF — ABNORMAL HIGH (ref 0–5)
RBC, UA: 7 /HPF — ABNORMAL HIGH (ref 0–4)
WBC, UA: 4 /HPF (ref 0–5)

## 2023-01-16 LAB — OSMOLALITY: Osmolality: 307 mosm/kg — ABNORMAL HIGH (ref 280–301)

## 2023-01-16 LAB — BRAIN NATRIURETIC PEPTIDE: NT Pro-BNP: 5356 pg/mL — ABNORMAL HIGH (ref 0–449)

## 2023-01-16 LAB — CK: Total CK: 74 U/L (ref 26–192)

## 2023-01-16 LAB — PTH, INTACT: Pth Intact: 67.5 pg/mL (ref 14.0–72.0)

## 2023-01-16 LAB — T4, FREE: T4 Free: 1.5 ng/dL (ref 0.9–1.8)

## 2023-01-16 MED ORDER — ACETAMINOPHEN 325 MG PO TABS
325 | Freq: Once | ORAL | Status: AC
Start: 2023-01-16 — End: 2023-01-16
  Administered 2023-01-16: 19:00:00 650 mg via ORAL

## 2023-01-16 MED ORDER — POLYETHYLENE GLYCOL 3350 17 G PO PACK
17 | Freq: Every day | ORAL | Status: DC | PRN
Start: 2023-01-16 — End: 2023-01-20

## 2023-01-16 MED ORDER — SODIUM CHLORIDE 0.9 % IV BOLUS
0.9 | Freq: Once | INTRAVENOUS | Status: AC
Start: 2023-01-16 — End: 2023-01-16
  Administered 2023-01-16: 16:00:00 1000 mL via INTRAVENOUS

## 2023-01-16 MED ORDER — ACETAMINOPHEN 325 MG PO TABS
325 | Freq: Four times a day (QID) | ORAL | Status: DC | PRN
Start: 2023-01-16 — End: 2023-01-20
  Administered 2023-01-17 – 2023-01-19 (×3): 650 mg via ORAL

## 2023-01-16 MED ORDER — PREDNISONE 5 MG PO TABS
5 | Freq: Every day | ORAL | Status: DC
Start: 2023-01-16 — End: 2023-01-20
  Administered 2023-01-17 – 2023-01-20 (×4): 5 mg via ORAL

## 2023-01-16 MED ORDER — OXYCODONE HCL 5 MG PO TABS
5 | ORAL | Status: DC | PRN
Start: 2023-01-16 — End: 2023-01-20
  Administered 2023-01-17 – 2023-01-20 (×12): 5 mg via ORAL

## 2023-01-16 MED ORDER — NORMAL SALINE FLUSH 0.9 % IV SOLN
0.9 | Freq: Two times a day (BID) | INTRAVENOUS | Status: DC
Start: 2023-01-16 — End: 2023-01-20
  Administered 2023-01-17 – 2023-01-19 (×5): 10 mL via INTRAVENOUS

## 2023-01-16 MED ORDER — LEVOTHYROXINE SODIUM 100 MCG PO TABS
100 | Freq: Every day | ORAL | Status: DC
Start: 2023-01-16 — End: 2023-01-20
  Administered 2023-01-17 – 2023-01-20 (×4): 100 ug via ORAL

## 2023-01-16 MED ORDER — ONDANSETRON HCL 4 MG/2ML IJ SOLN
4 | Freq: Four times a day (QID) | INTRAMUSCULAR | Status: DC | PRN
Start: 2023-01-16 — End: 2023-01-16

## 2023-01-16 MED ORDER — APIXABAN 5 MG PO TABS
5 | Freq: Two times a day (BID) | ORAL | Status: DC
Start: 2023-01-16 — End: 2023-01-20
  Administered 2023-01-17 – 2023-01-20 (×8): 5 mg via ORAL

## 2023-01-16 MED ORDER — ONDANSETRON 4 MG PO TBDP
4 | Freq: Three times a day (TID) | ORAL | Status: DC | PRN
Start: 2023-01-16 — End: 2023-01-16

## 2023-01-16 MED ORDER — ASPIRIN 81 MG PO TBEC
81 | Freq: Every day | ORAL | Status: DC
Start: 2023-01-16 — End: 2023-01-20
  Administered 2023-01-17 – 2023-01-20 (×4): 81 mg via ORAL

## 2023-01-16 MED ORDER — SODIUM CHLORIDE 0.9 % IV SOLN
0.9 | INTRAVENOUS | Status: DC | PRN
Start: 2023-01-16 — End: 2023-01-20

## 2023-01-16 MED ORDER — VITAMIN B-12 1000 MCG PO TABS
1000 | Freq: Every day | ORAL | Status: DC
Start: 2023-01-16 — End: 2023-01-20
  Administered 2023-01-17 – 2023-01-20 (×4): 2500 ug via ORAL

## 2023-01-16 MED ORDER — OXYCODONE HCL 5 MG PO TABS
5 | Freq: Once | ORAL | Status: AC
Start: 2023-01-16 — End: 2023-01-16
  Administered 2023-01-16: 19:00:00 5 mg via ORAL

## 2023-01-16 MED ORDER — ACETAMINOPHEN 650 MG RE SUPP
650 | Freq: Four times a day (QID) | RECTAL | Status: DC | PRN
Start: 2023-01-16 — End: 2023-01-20

## 2023-01-16 MED ORDER — SODIUM BICARBONATE 8.4 % IV SOLN
8.4 % | INTRAVENOUS | Status: DC
Start: 2023-01-16 — End: 2023-01-19
  Administered 2023-01-16 – 2023-01-19 (×5): via INTRAVENOUS

## 2023-01-16 MED ORDER — NORMAL SALINE FLUSH 0.9 % IV SOLN
0.9 | INTRAVENOUS | Status: DC | PRN
Start: 2023-01-16 — End: 2023-01-20

## 2023-01-16 MED ORDER — PANTOPRAZOLE SODIUM 40 MG PO TBEC
40 | Freq: Every day | ORAL | Status: DC
Start: 2023-01-16 — End: 2023-01-20
  Administered 2023-01-17 – 2023-01-20 (×4): 40 mg via ORAL

## 2023-01-16 MED FILL — SODIUM BICARBONATE 8.4 % IV SOLN: 8.4 % | INTRAVENOUS | Qty: 75 | Fill #0

## 2023-01-16 MED FILL — OXYCODONE HCL 5 MG PO TABS: 5 MG | ORAL | Qty: 1 | Fill #0

## 2023-01-16 MED FILL — ACETAMINOPHEN 325 MG PO TABS: 325 MG | ORAL | Qty: 2 | Fill #0

## 2023-01-16 NOTE — ED Notes (Signed)
 ED PA at bedside

## 2023-01-16 NOTE — H&P (Signed)
 Hospital Medicine History & Physical      PCP: [name], MD    Date of Admission: 01/16/2023    Date of Service: Pt seen/examined on 01/16/2023 and Admitted to Inpatient with expected LOS greater than two midnights due to medical therapy.     Chief Complaint: Weakness abnormal labs      History Of Present Illness:    82 y.o. female who presented to [facility] with weakness abnormal labs, building up weakness for the last few days, denies fever chills nausea or vomiting, had lab work done yesterday with elevated creatinine presented to ED today with low blood pressure was given IV fluid resuscitation improved patient being admitted for further management and treatment nephrology consulted discussed with ED provider via [facility] agree with plan as outlined below    Past Medical History:          Diagnosis Date    Anemia     Arthritis     Cancer (HCC) 1995    hx of breast cancer--no chemo or radiation needed    Fall     09/2021    GERD (gastroesophageal reflux disease)     Heart abnormality     SEES CARDIOLOGIST JASON Ou 2023    Hx of blood clots 2001    after gallbladder surgery--blood clot in arm where IV had been    Hypothyroidism     Kidney failure     stage 3    PONV (postoperative nausea and vomiting)     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 performed by Ruthie Males, MD at [facility] OR    BREAST SURGERY  1995    right mastectomy    BREAST SURGERY  Right 10/26/2021    EXCHANGE OF PERMANENT IMPLANT RIGHT BREAST performed by Ruthie Males, MD at [facility] OR    CHOLECYSTECTOMY  2001    COLONOSCOPY  04/12/2015    Esophagogastroduodenoscopy with biopsy, Colonoscopy, diagnostic. no findings except for diverticula and check in 10 years    HYSTERECTOMY (CERVIX STATUS UNKNOWN)      TAH-BSO. no  cancer    JOINT REPLACEMENT Left     left total knee replacement    LIPOMA RESECTION      right side below right breast    MASTECTOMY, RADICAL  1995    UPPER GASTROINTESTINAL ENDOSCOPY  04/12/2015    and colonoscopy. hiatal hernia and gastritis       Medications Prior to Admission:      Prior to Admission medications    Medication Sig Start Date End Date Taking? Authorizing Provider   oxyCODONE  (ROXICODONE ) 5 MG immediate release tablet Take 1 tablet by mouth every 4 hours as needed for Pain. Max Daily Amount: 30 mg   Yes [provider]   apixaban  (  ELIQUIS ) 5 MG TABS tablet Take 1 tablet by mouth 2 times daily 01/04/23  Yes [provider]   pantoprazole  (PROTONIX ) 40 MG tablet Take 1 tablet by mouth daily 01/02/23  Yes [provider]   sacubitril -valsartan  (ENTRESTO ) 24-26 MG per tablet Take 1 tablet by mouth 2 times daily 01/01/23  Yes [provider]   Cyanocobalamin  (B-12) 2500 MCG TABS Take 2,500 mcg by mouth daily   Yes [provider]   Iron, Ferrous Sulfate , 325 (65 Fe) MG TABS Take 325 mg by mouth daily   Yes [provider]   levothyroxine  (SYNTHROID ) 100 MCG tablet TAKE 1 TABLET BY MOUTH DAILY 10/08/22  Yes [name], MD   predniSONE  (DELTASONE ) 5 MG tablet TAKE 1 TABLET BY MOUTH DAILY  Patient taking differently: Take 1 tablet by mouth every other day 06/21/21  Yes Ali, Zeenat, MD   potassium chloride  (KLOR-CON ) 10 MEQ extended release tablet Take 1 tablet by mouth 2 times daily 06/08/19  Yes [provider]   aspirin  81 MG tablet Take 1 tablet by mouth daily   Yes [provider]   docusate sodium   (COLACE) 100 MG capsule Take 1 capsule by mouth daily as needed (Constipation)    [provider]   ondansetron  (ZOFRAN -ODT) 4 MG disintegrating tablet Take 1 tablet by mouth 3 times daily as needed for Nausea or Vomiting 01/15/23   [name], MD   furosemide  (LASIX ) 40 MG tablet Take 0.5 tablets by mouth daily 01/15/23   [name], MD       Allergies:  Pcn [penicillins], Triamterene , Ace inhibitors, Atenolol , Atorvastatin, Zithromax [azithromycin], and Codeine    Social History:      The patient currently lives at home    TOBACCO:   reports that she has never smoked. She has never used smokeless tobacco.  ETOH:   reports current alcohol use.  E-cigarette/Vaping       Questions Responses    E-cigarette/Vaping Use Never User    Start Date     Passive Exposure     Quit Date     Counseling Given Yes    Comments               Family History:      Reviewed and negative in regards to presenting illness/complaint.        Problem Relation Age of Onset    Cancer Mother         lung     Cancer Father         bladder    Heart Disease Father     High Blood Pressure Father     Cancer Maternal Aunt         ovarian    Cancer Paternal Cousin         ovarian       REVIEW OF SYSTEMS COMPLETED:   Pertinent positives as noted in the HPI. All other systems reviewed and negative.    PHYSICAL EXAM PERFORMED:    BP 101/67   Pulse 79   Temp 97.5 F (36.4 C) (Oral)   Resp 16   Ht 1.524 m (5')   Wt 73.4 kg (161 lb 12.8 oz)   SpO2 100%   BMI 31.60 kg/m     General appearance:  No apparent distress  HEENT:  Normal cephalic.  Respiratory:  Normal respiratory effort. Clear to auscultation, bilaterally without Rales/Wheezes/Rhonchi.  Cardiovascular:  Regular rate and rhythm with normal S1/S2 without murmurs,  rubs or gallops.  Abdomen: Soft, non-tender  Musculoskeletal:  No clubbing, cyanosis bilateral lower extremity chronic edema  Skin: Healing wound right knee  Neurologic: No focal weakness  Psychiatric:  Alert and  oriented  Capillary Refill: Brisk,3 seconds, normal  Peripheral Pulses: +2 palpable, equal bilaterally       Labs:     Recent Labs     01/16/23  1146   WBC 11.2*   HGB 12.2   HCT 37.1   PLT 310     Recent Labs     01/15/23  1139 01/16/23  1146   NA 136 134*   K 4.6 5.0   CL 99 98*   CO2 15* 18*   BUN 56* 66*   CREATININE 3.3* 4.1*   CALCIUM 10.3 10.5     No results for input(s): AST, ALT, BILIDIR, BILITOT, ALKPHOS in the last 72 hours.  No results for input(s): INR in the last 72 hours.  Recent Labs     01/16/23  1146 01/16/23  1242   TROPHS 84* 70*       Urinalysis:      Lab Results   Component Value Date/Time    NITRU Negative 01/16/2023 12:42 PM    WBCUA 4-5 08/23/2011 11:19 AM    BACTERIA 4+ 08/23/2011 11:19 AM    RBCUA Rare 03/15/2009 07:09 PM    BLOODU Negative 01/16/2023 12:42 PM    GLUCOSEU Negative 01/16/2023 12:42 PM    GLUCOSEU NEGATIVE 03/15/2009 07:09 PM       Radiology:       EKG:  I have reviewed the EKG with the following interpretation: LBBB    XR CHEST PORTABLE   Final Result   1. No acute cardiopulmonary process.   2. Large hiatal hernia.             Consults:    IP CONSULT TO NEPHROLOGY  IP CONSULT TO HOSPITALIST    ASSESSMENT:    Active Hospital Problems    Diagnosis Date Noted    AKI (acute kidney injury) (HCC) [N17.9] 01/16/2023         PLAN:    AKI on top of CKD, hold Entresto  hold diuretics nephrology consulted started on bicarb drip  History of congestive heart failure careful with fluid as patient on Entresto  and his EF of 32%  Recent pulmonary emboli continue Eliquis   Hypotension on admission improved now likely due to dehydration  Metabolic acidosis nephrology started on bicarb  LBBB no symptoms no chest pain  Home medication reviewed by myself  Goals of care full code  DVT Prophylaxis: Eliquis   Diet: No diet orders on file  Code Status: No Order    PT/OT Eval Status: Ordered    Dispo -inpatient       Mina DELENA Sinclair, MD    Thank you [name], MD for the opportunity to  be involved in this patient's care. If you have any questions or concerns please feel free to contact me at [phone].    Comment: Please note this report has been produced using speech recognition software and may contain errors related to that system including errors in grammar, punctuation, and spelling, as well as words and phrases that may be inappropriate. If there are any questions or concerns, please feel free to contact the dictating provider for clarification.

## 2023-01-16 NOTE — Consults (Signed)
 [facility]  Phone: [phone]  Fax: [phone]  [email or web address]         Reason for Consult: AKI on CKD stage III      History Obtained From:  patient, daughter, electronic medical record    History of Present Ilness: 82 year old white female with history of chronic kidney disease stage III follows with Dr. 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  History of congestive heart failure she was hospitalized at [facility] for right knee arthroplasty  Postop course was complicated by pulmonary emboli, congestive heart failure she underwent CT angiogram on 29 August also underwent left heart cath on 30 August which showed no coronary artery disease but EF was down to 32%  Patient was initiated on Entresto  and metoprolol in addition to Lasix   She was discharged to skilled nursing facility  She came home from the nursing home on 13 September and since then has not been feeling well she has been weak has been complaining of shortness of breath her blood pressure has been running low as low as in the 60s systolic  She has had poor oral intake with nausea requiring Zofran   Reports decreased urine output    No report of dysuria hematuria she is complaining of back pain  Denies use of nonsteroidal anti-inflammatory drugs  She saw her primary care physician Dr. Cyrena  yesterday and was noted to be hypotensive labs done yesterday revealed a creatinine of 3.3 mg and she was asked to come to the ER  Here she is found to have creatinine of 4.1 BUN of 66 and bicarb of 18        Past Medical History:        Diagnosis Date    Anemia     Arthritis     Cancer (HCC) 1995    hx of breast cancer--no chemo or radiation needed    Fall     09/2021    GERD (gastroesophageal reflux disease)     Heart abnormality     SEES CARDIOLOGIST JASON Brzoska 2023    Hx of blood clots 2001    after gallbladder surgery--blood clot in arm where IV had been    Hypothyroidism     Kidney  failure     stage 3    PONV (postoperative nausea and vomiting)     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 performed by Ruthie Males, MD at [facility] OR    BREAST SURGERY  1995    right mastectomy    BREAST SURGERY Right 10/26/2021    EXCHANGE OF PERMANENT IMPLANT RIGHT BREAST performed by Ruthie Males, MD at [facility] OR    CHOLECYSTECTOMY  2001    COLONOSCOPY  04/12/2015    Esophagogastroduodenoscopy with biopsy, Colonoscopy,  diagnostic. no findings except for diverticula and check in 10 years    HYSTERECTOMY (CERVIX STATUS UNKNOWN)      TAH-BSO. no  cancer    JOINT REPLACEMENT Left     left total knee replacement    LIPOMA RESECTION      right side below right breast    MASTECTOMY, RADICAL  1995    UPPER GASTROINTESTINAL ENDOSCOPY  04/12/2015    and colonoscopy. hiatal hernia and gastritis       Home Medications:    No current facility-administered medications on file prior to encounter.     Current Outpatient Medications on File Prior to Encounter   Medication Sig Dispense Refill    apixaban  (ELIQUIS ) 5 MG TABS tablet Take 1 tablet by mouth 2 times daily      rosuvastatin (CRESTOR) 10 MG tablet Take 1 tablet by mouth daily      pantoprazole  (PROTONIX ) 40 MG tablet Take 1 tablet by mouth daily      sacubitril -valsartan  (ENTRESTO ) 24-26 MG per tablet Take 1 tablet by mouth 2 times daily      Cyanocobalamin  (B-12) 2500 MCG TABS Take by mouth      Iron, Ferrous Sulfate , 325  (65 Fe) MG TABS Take by mouth      Docusate Sodium  (COLACE PO) Take by mouth      ondansetron  (ZOFRAN -ODT) 4 MG disintegrating tablet Take 1 tablet by mouth 3 times daily as needed for Nausea or Vomiting 21 tablet 0    furosemide  (LASIX ) 40 MG tablet Take 0.5 tablets by mouth daily 30 tablet 2    levothyroxine  (SYNTHROID ) 100 MCG tablet TAKE 1 TABLET BY MOUTH DAILY 90 tablet 1    predniSONE  (DELTASONE ) 5 MG tablet TAKE 1 TABLET BY MOUTH DAILY (Patient taking differently: Take 1 tablet by mouth every other day) 90 tablet 1    potassium chloride  (KLOR-CON ) 10 MEQ extended release tablet Take 1 tablet by mouth 2 times daily      aspirin  81 MG tablet Take 1 tablet by mouth daily         Allergies:  Pcn [penicillins], Triamterene , Ace inhibitors, Atenolol , Atorvastatin, Zithromax [azithromycin], and Codeine    Social History:    Social History     Socioeconomic History    Marital status: Widowed     Spouse name: Not on file    Number of children: Not on file    Years of education: Not on file    Highest education level: Not on file   Occupational History    Not on file   Tobacco Use    Smoking status: Never    Smokeless tobacco: Never   Vaping Use    Vaping status: Never Used   Substance and Sexual Activity    Alcohol use: Yes     Comment: social-rare. once a year    Drug use: No    Sexual activity: Not Currently   Other Topics Concern    Not on file   Social History Narrative    Not on file     Social Determinants of Health     Financial Resource Strain: Medium Risk (10/26/2022)    Overall Financial Resource Strain (CARDIA)     Difficulty of Paying Living Expenses: Somewhat hard   Food Insecurity: No Food Insecurity (10/26/2022)    Hunger Vital Sign     Worried About Running Out of Food in the Last Year: Never true     Ran Out of Food in  the Last Year: Never true   Transportation Needs: Unknown (10/26/2022)    PRAPARE - Therapist, Art (Medical): Not on file     Lack of Transportation  (Non-Medical): No   Physical Activity: Unknown (10/08/2022)    Exercise Vital Sign     Days of Exercise per Week: 0 days     Minutes of Exercise per Session: Patient declined   Stress: Not on file   Social Connections: Not on file   Intimate Partner Violence: Unknown (05/19/2022)    Received from [facility]     Interpersonal Safety     Feel physically or emotionally unsafe where currently live: Not on file     Harm by anyone: Not on file     Emotionally Harmed: Not on file   Housing Stability: Unknown (10/26/2022)    Housing Stability Vital Sign     Unable to Pay for Housing in the Last Year: Not on file     Number of Places Lived in the Last Year: Not on file     Unstable Housing in the Last Year: No       Family History:   Family History   Problem Relation Age of Onset    Cancer Mother         lung     Cancer Father         bladder    Heart Disease Father     High Blood Pressure Father     Cancer Maternal Aunt         ovarian    Cancer Paternal Cousin         ovarian       Review of Systems:   Pertinent positives stated above in HPI. All other systems were reviewed and were negative.    Physical exam:   Constitutional:  VITALS:  BP (!) 105/56   Pulse 85   Temp 97.5 F (36.4 C) (Oral)   Resp 16   Ht 1.524 m (5')   Wt 73.4 kg (161 lb 12.8 oz)   SpO2 100%   BMI 31.60 kg/m   Gen: alert, awake, nad elderly  Skin: no rash, turgor wnl  Heent:  eomi, mmm  Neck: no bruits or jvd noted, thyroid normal  Cardiovascular:  S1, S2 without m/r/g  Respiratory: CTA B without w/r/r; respiratory effort normal  Abdomen:  +bs, soft, nt, nd, no hepatosplenomegaly  Ext: No lower extremity edema  Psychiatric: mood and affect appropriate; judgement and insight intact  Musculoskeletal:  Rom, muscular strength intact; digits, nails normal right knee incision well-healed    Data/  CBC:   Lab Results   Component Value Date/Time    WBC 11.2 01/16/2023 11:46 AM    RBC 4.23 01/16/2023 11:46 AM    RBC 4.72 10/26/2015 11:32 AM     HGB 12.2 01/16/2023 11:46 AM    HCT 37.1 01/16/2023 11:46 AM    MCV 87.8 01/16/2023 11:46 AM    MCH 28.9 01/16/2023 11:46 AM    MCHC 32.9 01/16/2023 11:46 AM    RDW 16.2 01/16/2023 11:46 AM    PLT 310 01/16/2023 11:46 AM    MPV 9.7 01/16/2023 11:46 AM     BMP:    Lab Results   Component Value Date/Time    NA 134 01/16/2023 11:46 AM    K 5.0 01/16/2023 11:46 AM    CL 98 01/16/2023 11:46 AM    CO2 18 01/16/2023 11:46 AM  BUN 66 01/16/2023 11:46 AM    CREATININE 4.1 01/16/2023 11:46 AM    CALCIUM 10.5 01/16/2023 11:46 AM    GFRAA 33 01/03/2021 02:51 PM    GFRAA 48 10/11/2011 03:17 PM    LABGLOM 10 01/16/2023 11:46 AM    LABGLOM 45 10/17/2021 12:25 PM    GLUCOSE 112 01/16/2023 11:46 AM    GLUCOSE 109 10/26/2015 11:32 AM         Assessment/  1-AKI on CKD stage III suspect hemodynamic in nature in the setting of hypotension, ARB effect patient did undergo CT angiogram and left heart cath at the end of August but her creatinine was at baseline on discharge  2-CKD stage III follows with Dr. Annia baseline creatinine 1.5 mg  3-history of congestive heart failure recent left cath shows no coronary artery disease EF 32%  4 recent pulmonary emboli  5 hypotension suspect volume depletion and medication effect  6 metabolic acidosis anion gap    Plan/  1-we will stop her Entresto , metoprolol and Lasix  at this time  2-started on IV fluids with sodium bicarbonate   3-obtain urinalysis  4 monitor renal function and urine output closely  5 check a bladder scan  6 check CPK levels  7 further workup will be initiated if renal function does not improve    Thank you for the consultation.  Please do not hesitate to call with questions.    Kristi Fam, MD, GENI

## 2023-01-16 NOTE — Plan of Care (Signed)
 Problem: Discharge Planning  Goal: Discharge to home or other facility with appropriate resources  Outcome: Progressing  Flowsheets (Taken 01/16/2023 2000)  Discharge to home or other facility with appropriate resources:   Identify barriers to discharge with patient and caregiver   Arrange for needed discharge resources and transportation as appropriate   Identify discharge learning needs (meds, wound care, etc)     Problem: Pain  Goal: Verbalizes/displays adequate comfort level or baseline comfort level  Outcome: Progressing     Problem: Skin/Tissue Integrity  Goal: Absence of new skin breakdown  Description: 1.  Monitor for areas of redness and/or skin breakdown  2.  Assess vascular access sites hourly  3.  Every 4-6 hours minimum:  Change oxygen saturation probe site  4.  Every 4-6 hours:  If on nasal continuous positive airway pressure, respiratory therapy assess nares and determine need for appliance change or resting period.  Outcome: Progressing     Problem: ABCDS Injury Assessment  Goal: Absence of physical injury  Outcome: Progressing     Problem: Safety - Adult  Goal: Free from fall injury  Outcome: Progressing

## 2023-01-16 NOTE — ED Triage Notes (Signed)
 Pt presents to the ed with complaints of abnormal kidney functions tests. Pt arrives with daughter who states that the patients pcp sent her in for worsening labs.

## 2023-01-16 NOTE — Care Coordination (Signed)
 SW placed phone call to Tinnie at [facility] stating that the patient had a successful discharge to home last Friday with home care from [facility].     SW called [facility] liaison to advise of hospital admission so they can begin to follow along. She will need new orders when she is ready for discharge.     Respectfully submitted,    Sahana Boyland L. Aylee Littrell-Hix MSW, LISW-S  [facility] Social Worker  [phone]    Electronically signed by Willie Tyleah Loh, MSW, LSW on 01/16/2023 at 4:43 PM

## 2023-01-16 NOTE — Progress Notes (Signed)
 Perfect serve sent to NP [facility] for pain medication request?

## 2023-01-16 NOTE — Progress Notes (Signed)
 Patient admitted to room 4112 from ED.  Oriented to room, call light, and floor policies.  Plan of care reviewed with patient/family.  Pt is resting in bed and denies pain at this time; no s/s of distress noted. Assessment completed; VSS. Tele in place reading sinus rhythm with a rate of 89. Pt rates a high fall risk; bed alarm deferred OR bed alarm to be placed on the bed/chair.  Safety precautions in place; call light and bedside table within reach.  Pt encouraged to call for needs or ambulation.  Pt VU.  Will continue to monitor.

## 2023-01-16 NOTE — ED Provider Notes (Signed)
 [facility] EMERGENCY DEPARTMENT  EMERGENCY DEPARTMENT ENCOUNTER        Pt Name: Jodi Walls  MRN: [id number]  Birthdate 1940/06/22  Date of evaluation: 01/16/2023  Provider: Ozell FORBES Capers, PA-C  PCP: [name], MD  Note Started: 2:37 PM EDT 01/16/23      APP. I have evaluated this patient.        CHIEF COMPLAINT       Chief Complaint   Patient presents with    Abnormal Lab     Pt arrives with daughter who states that pt was seen at her primary care and they were told that pt's kidney function results were worse and patients pcp wanted her to be brought in        HISTORY OF PRESENT ILLNESS: 1 or more Elements     History From: Patient and daughter    Jodi Walls is a 82 y.o. female who presents to the emergency department at request of PCP with acute renal insufficiency/renal failure.  Patient is followed by Dr. Laquetta nephrology.  Dr. Janit is on call this facility.    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  Patient states stable.  Patient last renal panel on October 18, 2022 showing a BUN 20, creatinine 1.4 and GFR 37.  Today 01/16/2023 BUN 66, creatinine 4.1 and GFR 10.  Unclear reason.  Consider dehydration.    The patient indicates that 12/24/2022 she [facility]  48 hours after she had PE x 2 right lung.  She is currently on Eliquis  5 mg twice daily.  No complications related.  No history of DVT or PE.  Believe this to be a clot related to the recent surgery.    Patient was discharged from [facility] went to [facility] rehab facility and she was discharged on Friday doing relatively well.  However she indicates a steady decline over the past 4-5 days.    Patient with occasional nausea and occasional shortness of breath.  No chest pain is being reported.    Patient not on dialysis.  The patient does make urine.    Nursing Notes were all reviewed and agreed with or any disagreements were addressed in the HPI.    REVIEW OF SYSTEMS :       Review of Systems    Positives and Pertinent negatives as per HPI.     SURGICAL HISTORY     Past Surgical History:   Procedure Laterality Date    APPENDECTOMY      ARTHROPLASTY Left 12/25/2018    LEFT MIDDLE FINGER METACARPO-PHALANGEAL JOINT SYNOVECTOMY, LIGAMENT REBALANCING, EXTENSOR TENDON CENTRALIZATION AND SILICONE METACARPOPHALANGEAL JOINT ARTHROPLASTY performed by Lorrene KATHEE Bull, MD at [facility]      r/t to herniated disc in lower back, neck area--screws and rods in place    BREAST BIOPSY      left breast biopsy    BREAST CAPSULECTOMY Right 10/26/2021    . performed by Ruthie Males, MD at [facility] OR    BREAST SURGERY  1995    right mastectomy    BREAST SURGERY Right 10/26/2021    EXCHANGE OF PERMANENT IMPLANT RIGHT BREAST performed by Ruthie Males, MD at [facility]    CHOLECYSTECTOMY  2001    COLONOSCOPY  04/12/2015    Esophagogastroduodenoscopy with biopsy, Colonoscopy, diagnostic. no findings except for diverticula and check in 10 years    HYSTERECTOMY (CERVIX STATUS UNKNOWN)  TAH-BSO. no  cancer    JOINT REPLACEMENT Left     left total knee replacement    LIPOMA RESECTION      right side below right breast    MASTECTOMY, RADICAL  1995    UPPER GASTROINTESTINAL ENDOSCOPY  04/12/2015    and colonoscopy. hiatal hernia and gastritis       CURRENTMEDICATIONS       Previous Medications    APIXABAN  (ELIQUIS ) 5 MG TABS TABLET    Take 1 tablet by mouth 2 times daily    ASPIRIN  81 MG TABLET    Take 1 tablet by mouth daily    CYANOCOBALAMIN  (B-12) 2500 MCG TABS    Take by mouth    DOCUSATE SODIUM  (COLACE PO)    Take by mouth    FUROSEMIDE  (LASIX ) 40 MG TABLET    Take 0.5 tablets by mouth daily    IRON, FERROUS SULFATE , 325 (65 FE) MG TABS    Take by mouth    LEVOTHYROXINE  (SYNTHROID ) 100 MCG TABLET    TAKE 1 TABLET BY MOUTH DAILY    ONDANSETRON  (ZOFRAN -ODT) 4 MG DISINTEGRATING TABLET    Take 1 tablet by mouth 3 times daily as needed for Nausea or Vomiting    PANTOPRAZOLE  (PROTONIX ) 40 MG TABLET     Take 1 tablet by mouth daily    POTASSIUM CHLORIDE  (KLOR-CON ) 10 MEQ EXTENDED RELEASE TABLET    Take 1 tablet by mouth 2 times daily    PREDNISONE  (DELTASONE ) 5 MG TABLET    TAKE 1 TABLET BY MOUTH DAILY    ROSUVASTATIN (CRESTOR) 10 MG TABLET    Take 1 tablet by mouth daily    SACUBITRIL -VALSARTAN  (ENTRESTO ) 24-26 MG PER TABLET    Take 1 tablet by mouth 2 times daily       ALLERGIES     Pcn [penicillins], Triamterene , Ace inhibitors, Atenolol , Atorvastatin, Zithromax [azithromycin], and Codeine    FAMILYHISTORY       Family History   Problem Relation Age of Onset    Cancer Mother         lung     Cancer Father         bladder    Heart Disease Father     High Blood Pressure Father     Cancer Maternal Aunt         ovarian    Cancer Paternal Cousin         ovarian        SOCIAL HISTORY       Social History     Tobacco Use    Smoking status: Never    Smokeless tobacco: Never   Vaping Use    Vaping status: Never Used   Substance Use Topics    Alcohol use: Yes     Comment: social-rare. once a year    Drug use: No       SCREENINGS          Glasgow Coma Scale  Eye Opening: Spontaneous  Best Verbal Response: Oriented  Best Motor Response: Obeys commands  Glasgow Coma Scale Score: 15                        CIWA Assessment  BP: (!) 105/56  Pulse: 85             PHYSICAL EXAM  1 or more Elements     ED Triage Vitals   BP Systolic BP Percentile Diastolic BP Percentile  Temp Temp Source Pulse Respirations SpO2   01/16/23 1144 -- -- 01/16/23 1126 01/16/23 1126 01/16/23 1126 01/16/23 1126 01/16/23 1126   (!) 85/56   97.5 F (36.4 C) Oral (!) 111 18 96 %      Height Weight - Scale         01/16/23 1126 01/16/23 1126         1.524 m (5') 73.4 kg (161 lb 12.8 oz)             Physical Exam  Vitals and nursing note reviewed.   Constitutional:       Appearance: She is well-developed and normal weight. She is ill-appearing.   HENT:      Head: Normocephalic and atraumatic.      Right Ear: External ear normal.      Left Ear: External ear  normal.      Mouth/Throat:      Mouth: Mucous membranes are dry.   Eyes:      General: No scleral icterus.        Right eye: No discharge.         Left eye: No discharge.      Conjunctiva/sclera: Conjunctivae normal.   Cardiovascular:      Rate and Rhythm: Normal rate and regular rhythm.      Heart sounds: Normal heart sounds.   Pulmonary:      Effort: Pulmonary effort is normal.      Breath sounds: Normal breath sounds.   Abdominal:      General: Abdomen is flat. Bowel sounds are normal.      Palpations: Abdomen is soft.      Tenderness: There is no abdominal tenderness.   Musculoskeletal:         General: Tenderness present. Normal range of motion.      Cervical back: Normal range of motion and neck supple.      Right lower leg: No edema.      Left lower leg: No edema.      Comments: Right knee swelling and tenderness.  No erythema.  Patient has a well-healed incision line.   Skin:     General: Skin is warm and dry.      Findings: No bruising or erythema.   Neurological:      General: No focal deficit present.      Mental Status: She is alert and oriented to person, place, and time. Mental status is at baseline.   Psychiatric:         Mood and Affect: Mood normal.         Behavior: Behavior normal.         Thought Content: Thought content normal.         Judgment: Judgment normal.         DIAGNOSTIC RESULTS   LABS:    Labs Reviewed   TROPONIN - Abnormal; Notable for the following components:       Result Value    Troponin, High Sensitivity 84 (*)     All other components within normal limits   TROPONIN - Abnormal; Notable for the following components:    Troponin, High Sensitivity 70 (*)     All other components within normal limits   CBC WITH AUTO DIFFERENTIAL - Abnormal; Notable for the following components:    WBC 11.2 (*)     RDW 16.2 (*)     All other components within normal limits   BASIC METABOLIC PANEL W/ REFLEX TO  MG FOR LOW K - Abnormal; Notable for the following components:    Sodium 134 (*)      Chloride 98 (*)     CO2 18 (*)     Anion Gap 18 (*)     Glucose 112 (*)     BUN 66 (*)     Creatinine 4.1 (*)     Est, Glom Filt Rate 10 (*)     All other components within normal limits   BLOOD GAS, VENOUS - Abnormal; Notable for the following components:    pCO2, Ven 26.7 (*)     HCO3, Venous 16 (*)     All other components within normal limits   URINALYSIS WITH REFLEX TO CULTURE   BRAIN NATRIURETIC PEPTIDE   HEPATIC FUNCTION PANEL   LIPASE   PROTIME-INR   PROCALCITONIN   PTH, INTACT   TSH   T4, FREE   IRON AND TIBC   VITAMIN B12 & FOLATE   OSMOLALITY   CK       When ordered only abnormal lab results are displayed. All other labs were within normal range or not returned as of this dictation.    EKG: When ordered, EKG's are interpreted by the Emergency Department Physician in the absence of a cardiologist.  Please see their note for interpretation of EKG.    RADIOLOGY:   Non-plain film images such as CT, Ultrasound and MRI are read by the radiologist. Plain radiographic images are visualized and preliminarily interpreted by the ED Provider with the below findings:    Chest x-ray not showing any acute cardiopulmonary abnormality    Interpretation per the Radiologist below, if available at the time of this note:    XR CHEST PORTABLE   Final Result   1. No acute cardiopulmonary process.   2. Large hiatal hernia.           XR CHEST PORTABLE    Result Date: 01/16/2023  EXAMINATION: ONE XRAY VIEW OF THE CHEST 01/16/2023 10:15 am COMPARISON: 10/13/2020 HISTORY: ORDERING SYSTEM PROVIDED HISTORY: Shortness of Breath TECHNOLOGIST PROVIDED HISTORY: Reason for exam:->Shortness of Breath FINDINGS: The lungs appear clear.  There is a large hiatal hernia.  The heart is unremarkable.  Bony structures appear normal.  Visualized upper abdomen appear normal.  Patient had prior lower cervical fusion.     1. No acute cardiopulmonary process. 2. Large hiatal hernia.       No results found.    PROCEDURES   Unless otherwise noted below,  none     Procedures    CRITICAL CARE TIME (.cctime)   Critical Care  There was a high probability of life-threatening clinical deterioration in the patient's condition requiring my urgent intervention.  Total critical care time with the patient was 35 minutes excluding separately reportable procedures.  Critical care required due to patients the patient presentation of acute renal insufficiency/failure from typical stage III down to stage V.  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     EMERGENCY DEPARTMENT COURSE and DIFFERENTIAL DIAGNOSIS/MDM:   Vitals:    Vitals:    01/16/23 1257 01/16/23 1345 01/16/23 1400 01/16/23 1415   BP: (!) 89/53 (!) 101/57 (!) 101/58 ROLLEN)  105/56   Pulse: 76 76 80 85   Resp: 14 15 15 16    Temp:       TempSrc:       SpO2:  100%     Weight:       Height:           Patient was given the following medications:  Medications   sodium bicarbonate  75 mEq in sodium chloride  0.45 % 1,000 mL infusion (has no administration in time range)   sodium chloride  0.9 % bolus 1,000 mL (0 mLs IntraVENous Stopped 01/16/23 1419)   oxyCODONE  (ROXICODONE ) immediate release tablet 5 mg (5 mg Oral Given 01/16/23 1433)   acetaminophen  (TYLENOL ) tablet 650 mg (650 mg Oral Given 01/16/23 1433)                 Is this patient to be included in the SEP-1 Core Measure due to severe sepsis or septic shock?   No   Exclusion criteria - the patient is NOT to be included for SEP-1 Core Measure due to:  Infection is not suspected    Chronic Conditions affecting care: Recent TKR right by Dr. 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    CONSULTS: (Who and What was discussed)  IP CONSULT TO NEPHROLOGY  IP CONSULT TO HOSPITALIST        Records Reviewed (External and Source) none    CC/HPI Summary, DDx, ED Course, and Reassessment:     Patient referred by PCP with acute renal insufficiency/failure.  Patient seen yesterday PCP office systolic blood pressure 98/70.  Upon ED arrival today patient's ED blood pressure was 83/54.  NaCl 1 L over 2 hours administered.  Patient's last blood pressure was 105/56.  After fluids patient feeling improved.  Patient will need admission by the hospitalist and consult by nephrology.  The patient's laboratory studies on 6 S20/24 BUN 20, creatinine 1.4 and GFR 37.  On 01/16/2023-today, the BUN 20, creatinine 4.1 and GFR 10.    Nephrology has been consulted.  Dr. Janit was in the ER, evaluated patient and placed orders from a renal standpoint.    Chest x-ray showed no acute cardiopulmonary abnormality.  EKG showing a normal sinus rhythm with a QTc of 508.  Patient's sodium 134, potassium 5.0-hemolyzed and chloride is 90.  The patient's CO2 is 18, gap 18, BUN 66, creatinine 4.1 and GFR 10.  WBC 11.2.  Initial troponin 8 with repeat troponin 70.  VBG not showing any concerning findings.    ED treatment consisting of NaCl 1 L over 2 hours, Tylenol  650 p.o. and oxycodone  5 mg p.o.  Nephrology places order for sodium bicarb, Foley catheter, INO's, bladder scan revealing 166 mL in bladder.    Disposition Considerations (tests considered but not done, Admit vs D/C, Shared Decision Making, Pt Expectation of Test or Tx.):     Patient requires admission for acute on chronic renal failure.  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   Nephrology has been consulted.  Nephrology will follow the patient from that standpoint.  General medicine limit the patient.  Patient with recent TKR Dr. Belvie or [facility] right knee and this looks great.  However she developed PE x  2 and currently on Eliquis  5 mg twice daily.    The patient and daughter in room are agreeable to the admission.    I did send [employer] note to hospitalist at 2:57 PM.      I am the Primary Clinician of Record.  FINAL IMPRESSION      1. Acute renal failure superimposed on stage 4 chronic kidney disease, unspecified acute renal failure type (HCC)    2. Dehydration          DISPOSITION/PLAN     DISPOSITION Decision To Admit 01/16/2023 02:54:09 PM  Condition at Disposition: Fair      PATIENT REFERRED TO:  No follow-up provider specified.    DISCHARGE MEDICATIONS:  New Prescriptions    No medications on file       DISCONTINUED MEDICATIONS:  Discontinued Medications    No medications on file              (Please note that portions of this note were completed with a voice recognition program.  Efforts were made to edit the dictations but occasionally words are mis-transcribed.)    Ozell FORBES Capers, PA-C (electronically signed)        Hilliard Borges E, PA-C  01/16/23 1457

## 2023-01-16 NOTE — Progress Notes (Signed)
 4 Eyes Skin Assessment     NAME:  Jodi Walls  DATE OF BIRTH:  1940-10-01  MEDICAL RECORD NUMBER:  [id number]    The patient is being assessed for  Admission    I agree that at least one RN has performed a thorough Head to Toe Skin Assessment on the patient. ALL assessment sites listed below have been assessed.      Areas assessed by both nurses:    Head, Face, Ears, Shoulders, Back, Chest, Arms, Elbows, Hands, Sacrum. Buttock, Coccyx, Ischium, Legs. Feet and Heels, and Under Medical Devices         Does the Patient have a Wound? Yes wound(s) were present on assessment. LDA wound assessment was Initiated and completed by RN       Braden Prevention initiated by RN: Yes  Wound Care Orders initiated by RN: No    Pressure Injury (Stage 3,4, Unstageable, DTI, NWPT, and Complex wounds) if present, place Wound referral order by RN under ORDER ENTRY: No    New Ostomies, if present place, Ostomy referral order under ORDER ENTRY: No     Nurse 1 eSignature: Electronically signed by Eva Challenger, RN on 01/16/23 at 5:24 PM EDT    **SHARE this note so that the co-signing nurse can place an eSignature**    Nurse 2 eSignature: Electronically signed by Alyce Barrack, RN on 01/16/23 at 5:27 PM EDT

## 2023-01-16 NOTE — Progress Notes (Signed)
 Medication Reconciliation    List of medications patient is currently taking is complete.     Source of information: 1. Conversation with patient's daughter at bedside                                      2. EPIC records        Notes regarding home medications:   1. Patient hasn't received any home meds today per daughter      Jodi Walls, [facility]   01/16/2023  3:21 PM

## 2023-01-17 LAB — COMPREHENSIVE METABOLIC PANEL W/ REFLEX TO MG FOR LOW K
ALT: 10 U/L (ref 10–40)
AST: 26 U/L (ref 15–37)
Albumin/Globulin Ratio: 1.6 (ref 1.1–2.2)
Albumin: 3.1 g/dL — ABNORMAL LOW (ref 3.4–5.0)
Alkaline Phosphatase: 98 U/L (ref 40–129)
Anion Gap: 13 (ref 3–16)
BUN: 57 mg/dL — ABNORMAL HIGH (ref 7–20)
CO2: 19 mmol/L — ABNORMAL LOW (ref 21–32)
Calcium: 9 mg/dL (ref 8.3–10.6)
Chloride: 102 mmol/L (ref 99–110)
Creatinine: 3.4 mg/dL — ABNORMAL HIGH (ref 0.6–1.2)
Est, Glom Filt Rate: 13 — AB (ref >60)
Glucose: 85 mg/dL (ref 70–99)
Potassium reflex Magnesium: 4.4 mmol/L (ref 3.5–5.1)
Sodium: 134 mmol/L — ABNORMAL LOW (ref 136–145)
Total Bilirubin: 0.7 mg/dL (ref 0.0–1.0)
Total Protein: 5.1 g/dL — ABNORMAL LOW (ref 6.4–8.2)

## 2023-01-17 LAB — CBC WITH AUTO DIFFERENTIAL
Basophils %: 0.9 %
Basophils Absolute: 0 10*3/uL (ref 0.0–0.2)
Eosinophils %: 2.1 %
Eosinophils Absolute: 0.1 10*3/uL (ref 0.0–0.6)
Hematocrit: 28.2 % — ABNORMAL LOW (ref 36.0–48.0)
Hemoglobin: 9.7 g/dL — ABNORMAL LOW (ref 12.0–16.0)
Lymphocytes %: 30.1 %
Lymphocytes Absolute: 1.6 10*3/uL (ref 1.0–5.1)
MCH: 29.8 pg (ref 26.0–34.0)
MCHC: 34.5 g/dL (ref 31.0–36.0)
MCV: 86.3 fL (ref 80.0–100.0)
MPV: 9.9 fL (ref 5.0–10.5)
Monocytes %: 6.3 %
Monocytes Absolute: 0.3 10*3/uL (ref 0.0–1.3)
Neutrophils %: 60.6 %
Neutrophils Absolute: 3.2 10*3/uL (ref 1.7–7.7)
Platelets: 161 10*3/uL (ref 135–450)
RBC: 3.26 M/uL — ABNORMAL LOW (ref 4.00–5.20)
RDW: 16.1 % — ABNORMAL HIGH (ref 12.4–15.4)
WBC: 5.2 10*3/uL (ref 4.0–11.0)

## 2023-01-17 LAB — VITAMIN B12 & FOLATE
Folate: 9.27 ng/mL (ref 4.78–24.20)
Vitamin B-12: 2000 pg/mL — ABNORMAL HIGH (ref 211–911)

## 2023-01-17 MED FILL — ACETAMINOPHEN 325 MG PO TABS: 325 MG | ORAL | Qty: 2

## 2023-01-17 MED FILL — SODIUM BICARBONATE 8.4 % IV SOLN: 8.4 % | INTRAVENOUS | Qty: 75

## 2023-01-17 MED FILL — ASPIRIN 81 MG PO TBEC: 81 MG | ORAL | Qty: 1 | Fill #0

## 2023-01-17 MED FILL — PREDNISONE 5 MG PO TABS: 5 MG | ORAL | Qty: 1 | Fill #0

## 2023-01-17 MED FILL — PANTOPRAZOLE SODIUM 40 MG PO TBEC: 40 MG | ORAL | Qty: 1

## 2023-01-17 MED FILL — ASPIRIN 81 MG PO TBEC: 81 MG | ORAL | Qty: 1

## 2023-01-17 MED FILL — OXYCODONE HCL 5 MG PO TABS: 5 MG | ORAL | Qty: 1

## 2023-01-17 MED FILL — SYNTHROID 100 MCG PO TABS: 100 MCG | ORAL | Qty: 1

## 2023-01-17 MED FILL — ELIQUIS 5 MG PO TABS: 5 MG | ORAL | Qty: 1

## 2023-01-17 MED FILL — VITAMIN B-12 1000 MCG PO TABS: 1000 MCG | ORAL | Qty: 3

## 2023-01-17 MED FILL — PREDNISONE 5 MG PO TABS: 5 MG | ORAL | Qty: 1

## 2023-01-17 MED FILL — ACETAMINOPHEN 325 MG PO TABS: 325 MG | ORAL | Qty: 2 | Fill #0

## 2023-01-17 MED FILL — ELIQUIS 5 MG PO TABS: 5 MG | ORAL | Qty: 1 | Fill #0

## 2023-01-17 NOTE — Plan of Care (Signed)
 Problem: Discharge Planning  Goal: Discharge to home or other facility with appropriate resources  Outcome: Progressing  Flowsheets (Taken 01/17/2023 0900)  Discharge to home or other facility with appropriate resources:   Identify barriers to discharge with patient and caregiver   Arrange for needed discharge resources and transportation as appropriate   Identify discharge learning needs (meds, wound care, etc)     Problem: Pain  Goal: Verbalizes/displays adequate comfort level or baseline comfort level  Outcome: Progressing  Flowsheets (Taken 01/17/2023 0715)  Verbalizes/displays adequate comfort level or baseline comfort level:   Encourage patient to monitor pain and request assistance   Assess pain using appropriate pain scale   Administer analgesics based on type and severity of pain and evaluate response   Implement non-pharmacological measures as appropriate and evaluate response   Consider cultural and social influences on pain and pain management   Notify Licensed Independent Practitioner if interventions unsuccessful or patient reports new pain     Problem: Skin/Tissue Integrity  Goal: Absence of new skin breakdown  Description: 1.  Monitor for areas of redness and/or skin breakdown  Outcome: Progressing     Problem: ABCDS Injury Assessment  Goal: Absence of physical injury  Outcome: Progressing  Flowsheets (Taken 01/17/2023 0900)  Absence of Physical Injury: Implement safety measures based on patient assessment     Problem: Safety - Adult  Goal: Free from fall injury  Outcome: Progressing  Flowsheets (Taken 01/17/2023 0900)  Free From Fall Injury: Instruct family/caregiver on patient safety     Problem: Respiratory - Adult  Goal: Achieves optimal ventilation and oxygenation  Outcome: Progressing  Flowsheets (Taken 01/17/2023 0900)  Achieves optimal ventilation and oxygenation:   Oxygen supplementation based on oxygen saturation or arterial blood gases   Assess for changes in respiratory status   Assess for  changes in mentation and behavior   Position to facilitate oxygenation and minimize respiratory effort   Encourage broncho-pulmonary hygiene including cough, deep breathe, incentive spirometry   Assess and instruct to report shortness of breath or any respiratory difficulty     Problem: Cardiovascular - Adult  Goal: Maintains optimal cardiac output and hemodynamic stability  Outcome: Progressing  Flowsheets (Taken 01/17/2023 0900)  Maintains optimal cardiac output and hemodynamic stability:   Monitor blood pressure and heart rate   Assess for signs of decreased cardiac output  Goal: Absence of cardiac dysrhythmias or at baseline  Outcome: Progressing     Problem: Musculoskeletal - Adult  Goal: Return mobility to safest level of function  Outcome: Progressing  Goal: Maintain proper alignment of affected body part  Outcome: Progressing  Goal: Return ADL status to a safe level of function  Outcome: Progressing     Problem: Metabolic/Fluid and Electrolytes - Adult  Goal: Electrolytes maintained within normal limits  Outcome: Progressing  Flowsheets (Taken 01/17/2023 0900)  Electrolytes maintained within normal limits:   Monitor labs and assess patient for signs and symptoms of electrolyte imbalances   Administer electrolyte replacement as ordered   Monitor response to electrolyte replacements, including repeat lab results as appropriate  Goal: Hemodynamic stability and optimal renal function maintained  Outcome: Progressing  Flowsheets (Taken 01/17/2023 0900)  Hemodynamic stability and optimal renal function maintained:   Monitor labs and assess for signs and symptoms of volume excess or deficit   Monitor intake, output and patient weight   Monitor response to interventions for patient's volume status, including labs, urine output, blood pressure (other measures as available)  Goal: Glucose maintained within prescribed  range  Outcome: Progressing  Flowsheets (Taken 01/17/2023 0900)  Glucose maintained within prescribed  range: Monitor blood glucose as ordered     Problem: Hematologic - Adult  Goal: Maintains hematologic stability  Outcome: Progressing  Flowsheets (Taken 01/17/2023 0900)  Maintains hematologic stability: Assess for signs and symptoms of bleeding or hemorrhage     Problem: Nutrition Deficit:  Goal: Optimize nutritional status  Outcome: Progressing

## 2023-01-17 NOTE — Plan of Care (Signed)
 Problem: Discharge Planning  Goal: Discharge to home or other facility with appropriate resources  01/17/2023 2030 by Marcie Erm, RN  Outcome: Progressing  01/17/2023 1945 by Dedrick Darner, RN  Outcome: Progressing  Flowsheets (Taken 01/17/2023 0900)  Discharge to home or other facility with appropriate resources:   Identify barriers to discharge with patient and caregiver   Arrange for needed discharge resources and transportation as appropriate   Identify discharge learning needs (meds, wound care, etc)     Problem: Pain  Goal: Verbalizes/displays adequate comfort level or baseline comfort level  01/17/2023 2030 by Marcie Erm, RN  Outcome: Progressing  01/17/2023 1945 by Dedrick Darner, RN  Outcome: Progressing  Flowsheets (Taken 01/17/2023 0715)  Verbalizes/displays adequate comfort level or baseline comfort level:   Encourage patient to monitor pain and request assistance   Assess pain using appropriate pain scale   Administer analgesics based on type and severity of pain and evaluate response   Implement non-pharmacological measures as appropriate and evaluate response   Consider cultural and social influences on pain and pain management   Notify Licensed Independent Practitioner if interventions unsuccessful or patient reports new pain     Problem: Skin/Tissue Integrity  Goal: Absence of new skin breakdown  Description: 1.  Monitor for areas of redness and/or skin breakdown  2.  Assess vascular access sites hourly  3.  Every 4-6 hours minimum:  Change oxygen saturation probe site  4.  Every 4-6 hours:  If on nasal continuous positive airway pressure, respiratory therapy assess nares and determine need for appliance change or resting period.  01/17/2023 2030 by Marcie Erm, RN  Outcome: Progressing  01/17/2023 1945 by Dedrick Darner, RN  Outcome: Progressing     Problem: ABCDS Injury Assessment  Goal: Absence of physical injury  01/17/2023 2030 by Marcie Erm,  RN  Outcome: Progressing  01/17/2023 1945 by Dedrick Darner, RN  Outcome: Progressing  Flowsheets (Taken 01/17/2023 0900)  Absence of Physical Injury: Implement safety measures based on patient assessment     Problem: Safety - Adult  Goal: Free from fall injury  01/17/2023 2030 by Marcie Erm, RN  Outcome: Progressing  01/17/2023 1945 by Dedrick Darner, RN  Outcome: Progressing  Flowsheets (Taken 01/17/2023 0900)  Free From Fall Injury: Instruct family/caregiver on patient safety     Problem: Respiratory - Adult  Goal: Achieves optimal ventilation and oxygenation  01/17/2023 2030 by Marcie Erm, RN  Outcome: Progressing  01/17/2023 1945 by Dedrick Darner, RN  Outcome: Progressing  Flowsheets (Taken 01/17/2023 0900)  Achieves optimal ventilation and oxygenation:   Oxygen supplementation based on oxygen saturation or arterial blood gases   Assess for changes in respiratory status   Assess for changes in mentation and behavior   Position to facilitate oxygenation and minimize respiratory effort   Encourage broncho-pulmonary hygiene including cough, deep breathe, incentive spirometry   Assess and instruct to report shortness of breath or any respiratory difficulty     Problem: Cardiovascular - Adult  Goal: Maintains optimal cardiac output and hemodynamic stability  01/17/2023 2030 by Marcie Erm, RN  Outcome: Progressing  01/17/2023 1945 by Dedrick Darner, RN  Outcome: Progressing  Flowsheets (Taken 01/17/2023 0900)  Maintains optimal cardiac output and hemodynamic stability:   Monitor blood pressure and heart rate   Assess for signs of decreased cardiac output  Goal: Absence of cardiac dysrhythmias or at baseline  01/17/2023 2030 by Marcie Erm, RN  Outcome: Progressing  01/17/2023 1945 by Dedrick Darner, RN  Outcome: Progressing  Problem: Musculoskeletal - Adult  Goal: Return mobility to safest level of function  01/17/2023 2030 by Marcie Erm, RN  Outcome:  Progressing  01/17/2023 1945 by Dedrick Darner, RN  Outcome: Progressing  Goal: Maintain proper alignment of affected body part  01/17/2023 2030 by Marcie Erm, RN  Outcome: Progressing  01/17/2023 1945 by Dedrick Darner, RN  Outcome: Progressing  Goal: Return ADL status to a safe level of function  01/17/2023 2030 by Marcie Erm, RN  Outcome: Progressing  01/17/2023 1945 by Dedrick Darner, RN  Outcome: Progressing     Problem: Metabolic/Fluid and Electrolytes - Adult  Goal: Electrolytes maintained within normal limits  01/17/2023 2030 by Marcie Erm, RN  Outcome: Progressing  01/17/2023 1945 by Dedrick Darner, RN  Outcome: Progressing  Flowsheets (Taken 01/17/2023 0900)  Electrolytes maintained within normal limits:   Monitor labs and assess patient for signs and symptoms of electrolyte imbalances   Administer electrolyte replacement as ordered   Monitor response to electrolyte replacements, including repeat lab results as appropriate  Goal: Hemodynamic stability and optimal renal function maintained  01/17/2023 2030 by Marcie Erm, RN  Outcome: Progressing  01/17/2023 1945 by Dedrick Darner, RN  Outcome: Progressing  Flowsheets (Taken 01/17/2023 0900)  Hemodynamic stability and optimal renal function maintained:   Monitor labs and assess for signs and symptoms of volume excess or deficit   Monitor intake, output and patient weight   Monitor response to interventions for patient's volume status, including labs, urine output, blood pressure (other measures as available)  Goal: Glucose maintained within prescribed range  01/17/2023 2030 by Marcie Erm, RN  Outcome: Progressing  01/17/2023 1945 by Dedrick Darner, RN  Outcome: Progressing  Flowsheets (Taken 01/17/2023 0900)  Glucose maintained within prescribed range: Monitor blood glucose as ordered     Problem: Hematologic - Adult  Goal: Maintains hematologic stability  01/17/2023 2030 by Marcie Erm,  RN  Outcome: Progressing  01/17/2023 1945 by Dedrick Darner, RN  Outcome: Progressing  Flowsheets (Taken 01/17/2023 0900)  Maintains hematologic stability: Assess for signs and symptoms of bleeding or hemorrhage     Problem: Nutrition Deficit:  Goal: Optimize nutritional status  01/17/2023 2030 by Marcie Erm, RN  Outcome: Progressing  01/17/2023 1945 by Dedrick Darner, RN  Outcome: Progressing

## 2023-01-17 NOTE — Discharge Instr - COC (Addendum)
 Continuity of Care Form    Patient Name: Jodi Walls   DOB:  Jun 28, 1940  MRN:  [id number]    Admit date:  01/16/2023  Discharge date:  01/20/23    Code Status Order: Full Code   Advance Directives:   Advance Care Flowsheet Documentation             Admitting Physician:  Mina DELENA Sinclair, MD  PCP: [name], MD    Discharging Nurse: Damien Lakes RN  Discharging Hospital Unit/Room#: K4W-4112/4112-01  Discharging Unit Phone Number: [phone]    Emergency Contact:   Extended Emergency Contact Information  Primary Emergency Contact: [place], [place] [address] [place]  Home Phone: [phone]  Mobile Phone: [phone]  Relation: Child  Secondary Emergency Contact: Alisa Greig [place], [place] [address] [place]  Home Phone: [phone]  Mobile Phone: [phone]  Relation: Child    Past Surgical History:  Past Surgical History:   Procedure Laterality Date    APPENDECTOMY      ARTHROPLASTY Left 12/25/2018    LEFT MIDDLE FINGER METACARPO-PHALANGEAL JOINT SYNOVECTOMY, LIGAMENT REBALANCING, EXTENSOR TENDON CENTRALIZATION AND SILICONE METACARPOPHALANGEAL JOINT ARTHROPLASTY performed by Lorrene KATHEE Bull, MD at [facility] OR    BACK SURGERY      r/t to herniated disc in lower back, neck area--screws and rods in place    BREAST BIOPSY      left breast biopsy    BREAST CAPSULECTOMY Right 10/26/2021    . performed by Ruthie Males, MD at [facility] OR    BREAST SURGERY  1995    right mastectomy    BREAST SURGERY Right 10/26/2021    EXCHANGE OF PERMANENT IMPLANT RIGHT BREAST performed by Ruthie Males, MD at [facility] OR    CHOLECYSTECTOMY  2001    COLONOSCOPY  04/12/2015    Esophagogastroduodenoscopy with biopsy, Colonoscopy, diagnostic. no findings except for diverticula and check in 10 years    HYSTERECTOMY (CERVIX STATUS UNKNOWN)      TAH-BSO. no  cancer    JOINT REPLACEMENT Left     left total knee replacement    LIPOMA RESECTION      right side below right breast    MASTECTOMY,  RADICAL  1995    UPPER GASTROINTESTINAL ENDOSCOPY  04/12/2015    and colonoscopy. hiatal hernia and gastritis       Immunization History:   Immunization History   Administered Date(s) Administered    COVID-19, PFIZER Bivalent, DO NOT Dilute, (age 12y+), IM, 30 mcg/0.3 mL 02/08/2021    COVID-19, PFIZER PURPLE top, DILUTE for use, (age 73 y+), 85mcg/0.3mL 05/30/2019, 06/20/2019, 03/15/2020    COVID-19, PFIZER, (age 12y+), IM, 108mcg/0.3mL 02/15/2022    Influenza 04/06/2010    Influenza Vaccine, unspecified formulation 01/14/2013    Influenza Virus Vaccine 03/15/2009    Influenza Whole 02/10/2014    Influenza, FLUAD, (age 57 y+), IM, Quadv, 0.5mL 02/15/2022    Influenza, FLUZONE High Dose (age 61 y+), IM, Quadv, 0.7mL 01/23/2019, 02/12/2020, 02/08/2021    Influenza, FLUZONE High Dose, (age 50 y+), IM, Trivalent PF, 0.5mL 02/22/2011, 02/14/2012, 02/08/2015, 01/16/2016, 02/05/2017, 02/27/2018    Pneumococcal Conjugate Vaccine 06/10/2013    Pneumococcal, PCV-13, PREVNAR 13, (age 6w+), IM, 0.61mL 07/13/2015    Pneumococcal, PPSV23, PNEUMOVAX 23, (age 2y+), SC/IM, 0.46mL 03/27/2005, 08/24/2010    TDaP, ADACEL (age 6y-64y), BOOSTRIX (age 10y+), IM, 0.5mL 03/05/2016    Td  vaccine (adult) 03/27/2005       Active Problems:  Patient Active Problem List   Diagnosis Code    Other hyperlipidemia E78.49    Hypothyroidism E03.9    Essential hypertension, benign I10    HX: breast cancer Z85.3    Renal insufficiency N28.9    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    Chronic renal disease, stage III (HCC) [698720] N18.30    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    Major depressive disorder, recurrent, 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    AKI (acute kidney injury) (HCC) N17.9       Isolation/Infection:   Isolation            No Isolation          Patient Infection Status       Infection Onset Added Last Indicated Last Indicated By Review Planned Expiration Resolved  Resolved By    None active    Resolved    COVID-19 12/19/18 12/21/18 12/19/18 COVID-19   01/02/19 Infection Expired                       Nurse Assessment:  Last Vital Signs: BP 94/60   Pulse 79   Temp 97.6 F (36.4 C) (Oral)   Resp 16   Ht 1.524 m (5')   Wt 78.2 kg (172 lb 6.4 oz)   SpO2 99%   BMI 33.67 kg/m     Last documented pain score (0-10 scale): Pain Level: 4  Last Weight:   Wt Readings from Last 1 Encounters:   01/17/23 78.2 kg (172 lb 6.4 oz)     Mental Status:  oriented, alert, coherent, logical, thought processes intact, and able to concentrate and follow conversation    IV Access:  - None    Nursing Mobility/ADLs:  Walking   Assisted  Transfer  Assisted  Bathing  Assisted  Dressing  Assisted  Toileting  Assisted  Feeding  Independent  Med Admin  Independent  Med Delivery   whole    Wound Care Documentation and Therapy:  Wound 01/16/23 Knee Right Knee replacement (Active)   Dressing/Treatment Open to air 01/16/23 2015   Number of days: 0       Incision 10/26/21 Breast Lower;Right (Active)   Number of days: 447        Elimination:  Continence:   Bowel: Yes  Bladder: Yes  Urinary Catheter: None   Colostomy/Ileostomy/Ileal Conduit: No       Date of Last BM: 01/20/23    Intake/Output Summary (Last 24 hours) at 01/17/2023 1153  Last data filed at 01/17/2023 0943  Gross per 24 hour   Intake 1111.8 ml   Output 300 ml   Net 811.8 ml     I/O last 3 completed shifts:  In: 991.8 [I.V.:991.8]  Out: 300 [Urine:300]    Safety Concerns:     History of Falls (last 30 days)    Impairments/Disabilities:      None    Nutrition Therapy:  Current Nutrition Therapy:   - Oral Diet:  General    Routes of Feeding: Oral  Liquids: Thin Liquids  Daily Fluid Restriction: no  Last Modified Barium Swallow with Video (Video Swallowing Test): not done    Treatments at the Time of Hospital Discharge:   Respiratory Treatments: ***  Oxygen Therapy:  is not on home oxygen therapy.  Ventilator:    -  No ventilator support    Rehab  Therapies: Physical Therapy and Occupational Therapy  Weight Bearing Status/Restrictions: No weight bearing restrictions  Other Medical Equipment (for information only, NOT a DME order):  walker  Other Treatments: ***    Patient's personal belongings (please select all that are sent with patient):  Dentures upper and lower    RN SIGNATURE:  Electronically signed by Damien Lakes, RN on 01/20/23 at 1:28 PM EDT    CASE MANAGEMENT/SOCIAL WORK SECTION    Inpatient Status Date: 01/16/23    Readmission Risk Assessment Score:  Readmission Risk              Risk of Unplanned Readmission:  18           Discharging to Facility/ Agency   Name:  [facility] care    Address: [address], [address] [place]., [place] [address]  Phone: [phone]  Fax: [phone]         Case Manager/Social Worker signature: Electronically signed by Suzen JINNY Hasten, MSW on 01/17/23 at 11:53 AM EDT    PHYSICIAN SECTION    Prognosis: Fair    Condition at Discharge: Stable    Rehab Potential (if transferring to Rehab): Fair    Recommended Labs or Other Treatments After Discharge: PCP in 3 to 5 days or sooner if needed, nephrology, cardiology    Physician Certification: I certify the above information and transfer of Jodi Walls  is necessary for the continuing treatment of the diagnosis listed and that she requires Home Care for greater 30 days.     Update Admission H&P: No change in H&P    PHYSICIAN SIGNATURE:  Electronically signed by Suzan Manzanilla, MD on 01/20/23 at 11:55 AM EDT

## 2023-01-17 NOTE — Progress Notes (Signed)
 Physical Therapy  Facility/Department: [facility]  Physical Therapy Initial Assessment    Name: VENESHA PETRAITIS  DOB: 30-May-1940  MRN: [id number]  Date of Service: 01/17/2023    Discharge Recommendations: KALY MCQUARY scored a 19/24 on the AM-PAC short mobility form. Current research shows that an AM-PAC score of 18 or greater is typically associated with a discharge to the patient's home setting. Based on the patient's AM-PAC score and their current functional mobility deficits, it is recommended that the patient have 2-3 sessions per week of Physical Therapy at d/c to increase the patient's independence.  At this time, this patient demonstrates the endurance and safety to discharge home with HHPT and assist PRN and a follow up treatment frequency of 2-3x/wk.  Please see assessment section for further patient specific details.    If patient discharges prior to next session this note will serve as a discharge summary.  Please see below for the latest assessment towards goals.     HOME HEALTH CARE: LEVEL 1 STANDARD    - Initial home health evaluation to occur within 24-48 hours, in patient home   - Therapy to evaluate with goal of regaining prior level of functioning   - Therapy to evaluate if patient has Home Health Aide needs for personal care    Home with Home health PT, Home with assist PRN   PT Equipment Recommendations  Equipment Needed: No (Pt has all DME required for D/C)      Patient Diagnosis(es): The primary encounter diagnosis was Acute renal failure superimposed on stage 4 chronic kidney disease, unspecified acute renal failure type (HCC). A diagnosis of Dehydration was also pertinent to this visit.  Past Medical History:  has a past medical history of Anemia, Arthritis, Cancer (HCC), Fall, GERD (gastroesophageal reflux disease), Heart abnormality, Hx of blood clots, Hypothyroidism, Kidney failure, PONV (postoperative nausea and vomiting), Seronegative rheumatoid arthritis (HCC), Wears dentures,  and Wears glasses.  Past Surgical History:  has a past surgical history that includes Appendectomy; Hysterectomy; Mastectomy, radical (1995); Cholecystectomy (2001); joint replacement (Left); back surgery; lipoma resection; Breast surgery (1995); Breast biopsy; Colonoscopy (04/12/2015); Upper gastrointestinal endoscopy (04/12/2015); arthroplasty (Left, 12/25/2018); Breast surgery (Right, 10/26/2021); and Breast Capsulectomy (Right, 10/26/2021).    Assessment  Body Structures, Functions, Activity Limitations Requiring Skilled Therapeutic Intervention: Decreased functional mobility ;Decreased strength;Decreased endurance;Decreased balance;Increased pain  Assessment: Pt is an 82 y/o female who presents to the hospital with abnormal kidney labs. Pt with recent admission at [facility] for R TKA and discharged to [facility]. Pt recently discharged home and presents back to the hospital with abnormal labs. At baseline, the pt lives with her grandson and is mod I for performance of functional mobility tasks with use of a RW. Today, the pt presents slightly below her baseline level of function and required up to SBA for performance of functional mobility tasks. Pt will benefit from continued skilled PT to facilitate return to PLOF and to promote independence.  Treatment Diagnosis: Impaired functional mobility  Therapy Prognosis: Good  Decision Making: Low Complexity  Clinical Presentation: Stable  Barriers to Learning: None  Requires PT Follow-Up: Yes  Activity Tolerance  Activity Tolerance Comments: Pt tolerated the PT initial evaluation well with no significant limitations.    Plan  Physical Therapy Plan  General Plan: 3-5 times per week  Current Treatment Recommendations: Strengthening, ROM, Balance training, Functional mobility training, Transfer training, Gait training, Stair training, Endurance training, Neuromuscular re-education, Pain management, Home exercise program, Safety  education & training,  Patient/Caregiver education & training, Therapeutic activities  Safety Devices  Type of Devices: All fall risk precautions in place, Call light within reach, Chair alarm in place, Left in chair  Restraints  Restraints Initially in Place: No    Restrictions  Restrictions/Precautions  Restrictions/Precautions: Fall Risk, Weight Bearing (High fall risk)  Lower Extremity Weight Bearing Restrictions  Right Lower Extremity Weight Bearing: Weight Bearing As Tolerated     Subjective  General  Chart Reviewed: Yes  Patient assessed for rehabilitation services?: Yes  Additional Pertinent Hx: INDIRA SORENSON is a 82 y.o. female who presents to the emergency department at request of PCP with acute renal insufficiency/renal failure. Patient is followed by Dr. Laquetta nephrology. Dr. Janit is on call this facility. 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 Patient states stable. Patient last renal panel on October 18, 2022 showing a BUN 20, creatinine 1.4 and GFR 37. Today 01/16/2023 BUN 66, creatinine 4.1 and GFR 10. Unclear reason. Consider dehydration. The patient indicates that 12/24/2022 she [facility] 48 hours after she had PE x 2 right lung. She is currently on Eliquis  5 mg twice daily.  No complications related. No history of DVT or PE. Believe this to be a clot related to the recent surgery. Patient was discharged from [facility] went to [facility] rehab facility and she was discharged on Friday doing relatively well. However she indicates a steady decline over the past 4-5 days. Patient with occasional nausea and occasional shortness of breath. No chest pain is being reported. Patient not on dialysis. The patient does make urine.  Response To Previous Treatment: Not applicable  Family / Caregiver Present: No  Diagnosis: AKI  Follows Commands: Within Functional Limits  General Comment  Comments: Pt semi-fowlers in bed on arrival, pleasant and agreeable to participate in PT initial  evaluation.  Subjective  Subjective: Pt reports R knee pain, rated 4/10. Pain medication in place prior to arrival.     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 Scientist  Home Layout: One level  Home Access: Stairs to enter with rails  Entrance Stairs - Number of Steps: 2  Entrance Stairs - Rails: Both  Bathroom Shower/Tub: Event Organiser: Grab bars in shower, Paediatric nurse, Engineer, Drilling Accessibility: Accessible  Home Equipment: Designer, Industrial/product, Wheelchair - Manual  ADL Assistance: Independent  Homemaking Assistance: Independent  Ambulation Assistance: Independent (With [employer])  Transfer Assistance: Independent (With RW)  Active Driver: Yes  Mode of Transportation: Car  Additional Comments: Pt recently discharged home from [facility] and required assistance with ADLs and IADLs from daughter. Pt has been using w/c intermittently in home.    Vision/Hearing  Vision  Vision: Impaired  Vision Exceptions: Wears glasses for reading  Hearing  Hearing: Within functional limits      Cognition   Orientation  Overall Orientation Status: Within Normal Limits  Orientation Level: Oriented X4  Cognition  Overall Cognitive Status: WFL    Objective  Observation/Palpation  Posture:  (Mild forward head, rounded shoulders, and increased thoracic kyphosis in sitting and standing.)  Observation: Pt on RA on arrival. R knee incision is clean, dry, and intact.  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.)     Bed mobility  Supine to Sit: Stand by assistance (HOB elevated, increased  time required to complete task, use of bed rail)  Scooting: Stand by assistance (Toward EOB)  Transfers  Sit to Stand: Contact guard assistance  Stand to Sit: Contact guard assistance  Comment: Use of RW for transfers, verbal cues required for hand placement  Ambulation  Surface:  Level tile  Device: Rolling Walker  Assistance: Stand by assistance  Quality of Gait: Shortened step lengths, decreased speed, steady with no overt losses of balance  Distance: 30'     Balance  Sitting - Static:  (Independent)  Standing - Static:  (SBA with B UE support on RW)  Standing - Dynamic:  (SBA with B UE support on RW)     AM-PAC - Mobility  AM-PAC Basic Mobility - Inpatient   How much help is needed turning from your back to your side while in a flat bed without using bedrails?: None  How much help is needed moving from lying on your back to sitting on the side of a flat bed without using bedrails?: A Little  How much help is needed moving to and from a bed to a chair?: A Little  How much help is needed standing up from a chair using your arms?: A Little  How much help is needed walking in hospital room?: A Little  How much help is needed climbing 3-5 steps with a railing?: A Little  AM-PAC Inpatient Mobility Raw Score : 19  AM-PAC Inpatient T-Scale Score : 45.44  Mobility Inpatient CMS 0-100% Score: 41.77  Mobility Inpatient CMS G-Code Modifier : CK    Goals  Short Term Goals  Time Frame for Short Term Goals: By discharge  Short Term Goal 1: Pt will transfer supine to/from sit independently  Short Term Goal 2: Pt will transfer sit to/from stand with mod I  Short Term Goal 3: Pt will ambulate at least 50 ft with use of RW at mod I  Short Term Goal 4: Pt will asc/dec 2 steps with B HR at mod I  Patient Goals   Patient Goals : To return home     Education  Patient Education  Education Given To: Patient  Education Provided: Role of Therapy;Plan of Care  Education Provided Comments: General safety, discharge recommendations  Education Method: Verbal  Barriers to Learning: None  Education Outcome: Verbalized understanding;Continued education needed    Therapy Time   Individual Concurrent Group Co-treatment   Time In       0753   Time Out       0835   Minutes       42   Timed Code Treatment Minutes: [address] Minutes        Arseniy Toomey, PT  Calisha Tindel E. Latanga Nedrow PT, DPT [phone]

## 2023-01-17 NOTE — Progress Notes (Signed)
 Comprehensive Nutrition Assessment    Type and Reason for Visit:  Initial, Positive Nutrition Screen, Consult (MST of 3 and consult for unintentional weight loss)    Nutrition Recommendations/Plan:   Continue regular diet; No added salt (3-4 gm)  Provide ONS (Chocolate Ensure) BID with lunch and dinner  Suggest scheduled BM regimen  Continue to monitor patient's PO intake for any needed changes.     Malnutrition Assessment:  Malnutrition Status:  At risk for malnutrition (Comment) (Poor PO intake vs. age related wasting) (01/17/23 1150)    Context:  Chronic Illness     Findings of the 6 clinical characteristics of malnutrition:  Energy Intake:  Unable to assess  Weight Loss:  No significant weight loss     Body Fat Loss:  Mild body fat loss Orbital, Buccal region   Muscle Mass Loss:  Mild muscle mass loss Temples (temporalis), Clavicles (pectoralis & deltoids)  Fluid Accumulation:  Unable to assess    Grip Strength:  Not Performed    Nutrition Assessment:    Per progress notes patient presented to the ED with abnormal lab and weakness for the last few days. Patient reports poor oral intake, back pain and decreased urine output. Patient has CKD stage 3. PMH of CHF, Anemia, Breast Cancer, GERD, and Kidney failure. During assessment patient reports usual body weight was 170lbs last month and has notice losing weight since. Per weight history patient's admission weight 161lbs and current weight 172lbs. On 07/11/2022 patient was 185lbs and lost 13lbs in 7 months. Patient lost 7% of body weight which is not significant weight lose. Patient reports lacking appetite because she is not eating much and food not taste the same. Patient reports eating her bacon and orange from breakfast this moring. She also consumed her meals from yesturday. Dietitian discussed ONS and patient is receptive to ONS. Dietitian will monitor patient's PO intake for any needed changes.    Nutrition Related Findings:    Labs reported (Na+ 134 L),  (BUN 57 H), (Creatinine 3.4 H), (Albumin 3.1 L). Per progress notes patient has not had a BM in several days (suggest scheduled BM regimen). Patient is oriented x 4 Wound Type: Surgical Incision (right knee replacement)       Current Nutrition Intake & Therapies:    Average Meal Intake: Unable to assess     ADULT DIET; Regular; No Added Salt (3-4 gm)  ADULT ORAL NUTRITION SUPPLEMENT; Lunch, Dinner; Standard High Calorie/High Protein Oral Supplement    Anthropometric Measures:  Height: 152.4 cm (5')  Ideal Body Weight (IBW): 100 lbs (45 kg)    Admission Body Weight: 73.4 kg (161 lb 13.1 oz)  Current Body Weight: 78.2 kg (172 lb 6.4 oz), 172.4 % IBW. Weight Source: Bed Scale  Current BMI (kg/m2): 33.7        Weight Adjustment For: No Adjustment                 BMI Categories: Obese Class 1 (BMI 30.0-34.9)    Estimated Daily Nutrient Needs:  Energy Requirements Based On: Kcal/kg  Weight Used for Energy Requirements: Ideal  Energy (kcal/day): 1350-1575 kcal using (30-35 kcal/kg/day)  Weight Used for Protein Requirements: Ideal  Protein (g/day): 68g using (1.5 g/kg)  Method Used for Fluid Requirements: 1 ml/kcal  Fluid (ml/day): 1350-1575 ml    Nutrition Diagnosis:   Inadequate oral intake related to other (comment) (lack of appetite) as evidenced by poor intake prior to admission    Nutrition Interventions:   Food  and/or Nutrient Delivery: Continue Current Diet, Start Oral Nutrition Supplement  Nutrition Education/Counseling: No recommendation at this time  Coordination of Nutrition Care: Continue to monitor while inpatient       Goals:     Goals: PO intake 75% or greater       Nutrition Monitoring and Evaluation:   Behavioral-Environmental Outcomes: None Identified  Food/Nutrient Intake Outcomes: Food and Nutrient Intake, Supplement Intake  Physical Signs/Symptoms Outcomes: Biochemical Data, Weight    Discharge Planning:    Too soon to determine     Darly Marcelin, RD  Contact: [phone]

## 2023-01-17 NOTE — Progress Notes (Signed)
 V2.0    [facility] Progress Note      Name:  Jodi Walls DOB/Age/Sex: [birth year]  (82 y.o. female)   MRN & CSN:  [id number] & [id number] Encounter Date/Time: 01/17/2023 12:30 PM EDT   Location:  [id number]/4112-01 PCP: [name], MD     Attending:Sehar Sedano, MD       Hospital Day: 2    Assessment and Recommendations   Jodi Walls is a 82 y.o. female with pmh of arthritis, remote history of breast cancer, GERD, hypothyroidism, CKD 3 who presents with AKI (acute kidney injury) (HCC)    Patient examined this morning.  Feeling some improvement.  Patient lives at home with grandson.  Uses a walker at the baseline.    Plan:   AKI on CKD 3  IV fluid with sodium bicarb drip  nephrology was consulted  Holding nephrotoxic agents such as numbness Entresto  and diuretics until nephrology recs      2.  Weakness  PT OT to evaluate and recommend    3.  Hypotension  Most likely due to dehydration  Will continue to follow monitor vitals for further management    4.  Remote history of PE  Eliquis  daily      Diet ADULT DIET; Regular; No Added Salt (3-4 gm)   DVT Prophylaxis []  Lovenox, []   Heparin, []  SCDs, [x]  Ambulation,  [x]  Eliquis , []  Xarelto  []  Coumadin   Code Status Full Code   Disposition From: Home  Expected Disposition: Home  Estimated Date of Discharge: 1-2 days  Patient requires continued admission due to upon clinical improvement   Surrogate Decision Maker/ POA       Personally reviewed Lab Studies and Imaging         Medical Decision Making:  The following items were considered in medical decision making:  Discussion of patient care with other providers  Reviewed clinical lab tests  Reviewed radiology tests  Reviewed other diagnostic tests/interventions  Independent review of radiologic images  Microbiology cultures and other micro tests reviewed      Subjective:     Chief Complaint: Weakness and abnormal labs was found to have an AKI on CKD    Jodi Walls is a 82 y.o. female who presents with weakness and  abnormal labs was found to have an AKI on CKD      Review of Systems:      Pertinent positives and negatives discussed in HPI    Objective:     Intake/Output Summary (Last 24 hours) at 01/17/2023 1230  Last data filed at 01/17/2023 0943  Gross per 24 hour   Intake 1111.8 ml   Output 300 ml   Net 811.8 ml      Vitals:   Vitals:    01/17/23 0500 01/17/23 0715 01/17/23 0911 01/17/23 1015   BP: 98/63 119/78  94/60   Pulse:  80  79   Resp:  17  16   Temp:  97.8 F (36.6 C)  97.6 F (36.4 C)   TempSrc:  Oral  Oral   SpO2:  100%  99%   Weight:       Height:   1.524 m (5')          Physical Exam:      Physical Exam Performed:    BP 94/60   Pulse 79   Temp 97.6 F (36.4 C) (Oral)   Resp 16   Ht 1.524 m (5')   Wt 78.2 kg (172 lb  6.4 oz)   SpO2 99%   BMI 33.67 kg/m     General appearance: No apparent distress,cooperative.  HEENT: Pupils equal, round, and reactive to light.   Neck: Supple, with full range of motion. Trachea midline.  Respiratory:  Normal respiratory effort. Clear to auscultation,  Cardiovascular: Regular rate and rhythm with normal S1/S2   Abdomen: Soft, non-tender, non-distended   Musculoskeletal: No dema bilaterally.  Full range of motion without deformity.  Skin: Ecchymosis throughout upper extremities  Neurologic:  Neurovascularly intact without any focal sensory/motor deficits.   Psychiatric: Alert and oriented, thought content appropriate, normal insight  Capillary Refill: Brisk, 3 seconds, normal   Peripheral Pulses: +2 palpable, equal bilaterally       Medications:   Medications:   . apixaban   5 mg Oral BID   . aspirin   81 mg Oral Daily   . vitamin B-12  2,500 mcg Oral Daily   . levothyroxine   100 mcg Oral Daily   . pantoprazole   40 mg Oral Daily   . predniSONE   5 mg Oral Daily   . sodium chloride  flush  5-40 mL IntraVENous 2 times per day      Infusions:   . sodium bicarbonate  75 mEq in sodium chloride  0.45 % 1,000 mL infusion 75 mL/hr at 01/17/23 0558   . sodium chloride        PRN Meds:  oxyCODONE , 5 mg, Q4H PRN  sodium chloride  flush, 5-40 mL, PRN  sodium chloride , , PRN  polyethylene glycol, 17 g, Daily PRN  acetaminophen , 650 mg, Q6H PRN   Or  acetaminophen , 650 mg, Q6H PRN        Labs and Imaging   XR CHEST PORTABLE    Result Date: 01/16/2023  EXAMINATION: ONE XRAY VIEW OF THE CHEST 01/16/2023 10:15 am COMPARISON: 10/13/2020 HISTORY: ORDERING SYSTEM PROVIDED HISTORY: Shortness of Breath TECHNOLOGIST PROVIDED HISTORY: Reason for exam:->Shortness of Breath FINDINGS: The lungs appear clear.  There is a large hiatal hernia.  The heart is unremarkable.  Bony structures appear normal.  Visualized upper abdomen appear normal.  Patient had prior lower cervical fusion.     1. No acute cardiopulmonary process. 2. Large hiatal hernia.       CBC:   Recent Labs     01/16/23  1146 01/17/23  0435   WBC 11.2* 5.2   HGB 12.2 9.7*   PLT 310 161     BMP:    Recent Labs     01/15/23  1139 01/16/23  1146 01/17/23  0435   NA 136 134* 134*   K 4.6 5.0 4.4   CL 99 98* 102   CO2 15* 18* 19*   BUN 56* 66* 57*   CREATININE 3.3* 4.1* 3.4*   GLUCOSE 119* 112* 85     Hepatic:   Recent Labs     01/16/23  1746 01/17/23  0435   AST 29 26   ALT 12 10   BILITOT 0.8 0.7   ALKPHOS 104 98     Lipids:   Lab Results   Component Value Date/Time    CHOL 207 05/16/2022 02:43 PM    HDL 66 05/16/2022 02:43 PM    HDL 78 04/06/2010 11:33 AM    TRIG 97 05/16/2022 02:43 PM     Hemoglobin A1C:   Lab Results   Component Value Date/Time    LABA1C 5.4 12/10/2022 02:43 PM     TSH:   Lab Results   Component Value Date/Time  TSH 0.74 01/16/2023 05:46 PM     Troponin: No results found for: TROPONINT  Lactic Acid: No results for input(s): LACTA in the last 72 hours.  BNP:   Recent Labs     01/16/23  1746   PROBNP 5,356*     UA:  Lab Results   Component Value Date/Time    NITRU Negative 01/16/2023 12:42 PM    COLORU Yellow 01/16/2023 12:42 PM    PHUR 5.5 01/16/2023 12:42 PM    PHUR 6.5 08/23/2011 11:19 AM    WBCUA 4 01/16/2023 12:42 PM    RBCUA 7  01/16/2023 12:42 PM    BACTERIA None Seen 01/16/2023 12:42 PM    CLARITYU Clear 01/16/2023 12:42 PM    LEUKOCYTESUR TRACE 01/16/2023 12:42 PM    UROBILINOGEN 1.0 01/16/2023 12:42 PM    BILIRUBINUR Negative 01/16/2023 12:42 PM    BLOODU Negative 01/16/2023 12:42 PM    GLUCOSEU Negative 01/16/2023 12:42 PM    GLUCOSEU NEGATIVE 03/15/2009 07:09 PM    KETUA TRACE 01/16/2023 12:42 PM     Urine Cultures: No results found for: LABURIN  Blood Cultures: No results found for: BC  No results found for: BLOODCULT2  Organism: No results found for: [facility]      Electronically signed by Suzan Manzanilla, MD on 01/17/2023 at 12:30 PM  Comment: Please note this report has been produced using speech recognition software and may contain errors related to that system including errors in grammar, punctuation, and spelling, as well as words and phrases that may be inappropriate. If there are any questions or concerns, please feel free to contact the dictating provider for clarification.

## 2023-01-17 NOTE — Discharge Instructions (Signed)
 Extra Heart Failure Education/ Tools/ Resources:     https://mydigitalpublication.com/publication/?i=753422   --- this is American Heart Association interactive Healthier Living with Heart Failure guidebook.  Please click hyperlink or copy / paste link into search bar. The QR Code is also available below. Use your mouse to scroll through the pages.  Lots of information about weight monitoring, diet tips, activity, meds, etc    Heart Failure Tools and Resources QR Code is below. It includes multiple resources to include symptom tracker, med tracker, further HF info, and access to a HF Support Network online Community    HF Helper App  -- this is a free smart phone app available for Dentist.  Use your phone to track sodium / fluid intake, zone tool symptom tracking, weights, medications, etc. Click on this hyperlink  HF Helper App   for QR code for easy download or the link is also found in the below HF Tools and Resources.      DASH (Dietary Approach to Stop Hypertension) diet --  StartupTour.com.cy -- this diet is a flexible eating plan that promotes heart healthy eating style.  Click on hyperlink or copy / paste link into search bar.  Lots of low sodium recipes and tips.    IdeaBulletin.ch  -- more free recipes

## 2023-01-17 NOTE — Progress Notes (Signed)
 [facility]  Phone: [phone]  Fax: [phone]  [email or web address]         Reason for Consult: AKI on CKD stage III      History Obtained From:  patient, daughter, electronic medical record    History of Present Ilness: 82 year old white female with history of chronic kidney disease stage III and follows with Dr. Annia. Baseline creatinine ~ 1.5 mg. 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  History of congestive heart failure she was hospitalized at [facility] for right knee arthroplasty.  Postop course was complicated by pulmonary emboli and congestive heart failure. She underwent CT angiogram on 8/29. Also underwent left heart cath on 8/30 which showed no coronary artery disease but EF was down to 32%.  Patient was initiated on Entresto  and metoprolol in addition to Lasix .  She was discharged to skilled nursing facility.  She came home from the nursing home on 13 September and since then has not been feeling well. She has been weak and complaining of shortness of breath. Her blood pressure has been running as low as in the 60s systolic  She has had poor oral intake with nausea requiring Zofran   Reports decreased urine output    No report of dysuria or hematuria. She is complaining of back pain  Denies use of nonsteroidal anti-inflammatory drugs  She saw her primary care physician Dr. Cyrena yesterday and was noted to be hypotensive. Labs done yesterday revealed a creatinine of 3.3 mg and she was asked to come to the ER.  Here she is found to have creatinine of 4.1 BUN of 66 and bicarb of 18      Review of Systems:   Labs and chart reviewed. Patient resting in bed on RA. Feeling better today. BP remains soft but better than yesterday. Cr is trending down.     Physical exam:   Constitutional:  VITALS:  BP 94/60   Pulse 79   Temp 97.6 F (36.4 C) (Oral)   Resp 16   Ht 1.524 m (5')   Wt 78.2 kg (172 lb 6.4 oz)   SpO2 99%   BMI 33.67 kg/m     Gen: alert, awake, nad  elderly  Skin: no rash, turgor wnl  Heent:  eomi, mmm  Neck: no bruits or jvd noted, thyroid normal  Cardiovascular:  S1, S2 without m/r/g  Respiratory: CTA B without w/r/r; respiratory effort normal  Abdomen:  +bs, soft, nt, nd, no hepatosplenomegaly  Ext: No lower extremity edema  Psychiatric: mood and affect appropriate; judgement and insight intact  Musculoskeletal:  Rom, muscular strength intact; digits, nails normal right knee incision well-healed    Data/  CBC:   Lab Results   Component Value Date/Time    WBC 5.2 01/17/2023 04:35 AM    RBC 3.26 01/17/2023 04:35 AM    RBC 4.72 10/26/2015 11:32 AM    HGB 9.7 01/17/2023 04:35 AM    HCT 28.2 01/17/2023 04:35 AM    MCV 86.3 01/17/2023 04:35 AM    MCH 29.8 01/17/2023 04:35 AM    MCHC 34.5 01/17/2023 04:35 AM    RDW 16.1 01/17/2023 04:35 AM    PLT 161 01/17/2023 04:35 AM    MPV 9.9 01/17/2023 04:35 AM     BMP:    Lab Results   Component Value Date/Time    NA 134 01/17/2023 04:35 AM    K 4.4 01/17/2023 04:35 AM    CL 102 01/17/2023 04:35 AM    CO2  19 01/17/2023 04:35 AM    BUN 57 01/17/2023 04:35 AM    CREATININE 3.4 01/17/2023 04:35 AM    CALCIUM 9.0 01/17/2023 04:35 AM    GFRAA 33 01/03/2021 02:51 PM    GFRAA 48 10/11/2011 03:17 PM    LABGLOM 13 01/17/2023 04:35 AM    LABGLOM 45 10/17/2021 12:25 PM    GLUCOSE 85 01/17/2023 04:35 AM    GLUCOSE 109 10/26/2015 11:32 AM         Assessment/  1-AKI on CKD stage III suspect hemodynamic in nature in the setting of hypotension in the setting of ARB use. Patient did undergo CT angiogram and left heart cath at the end of August but her creatinine was at baseline upon discharge  2-CKD stage III follows with Dr. Annia. Baseline Cr ~ 1.5 mg  3-History of congestive heart failure recent left cath shows no coronary artery disease EF 32%  4 Recent pulmonary emboli  5 Hypotension -- suspect volume depletion and medication effect  6 Metabolic acidosis anion gap    Plan/  1-Continue holding Entresto , metoprolol and Lasix  at this  time  2-Continue IV fluids with sodium bicarbonate   3-UA noted -- trace ketones, 30 protein, 3-5 fine casts, 6-10 hyaline casts  4 Monitor renal function and urine output closely  5 Check bladder scan PRN  7 Further workup will be initiated if renal function does not improve    Will discuss with nephrology attending physician, Dr. Janit.  See attestation for additional recommendations.    Rocky MARLA Moats, APRN - CNP

## 2023-01-17 NOTE — Progress Notes (Signed)
 Client alert and oriented x4. Denies any pain. Morning (9am) meds given, vitals assessed. Head-to-toe complete and charted- no new abnormal findings. Client was assessed by physical therapy and is now sitting in chair watching TV waiting on breakfast delivery. Call light/ remote within reach. Electronically signed by Joesph Pizza on 01/17/2023 at 9:19 AM

## 2023-01-17 NOTE — Care Coordination (Signed)
 Case Management Assessment  Initial Evaluation    Date/Time of Evaluation: 01/17/2023 11:46 AM  Assessment Completed by: Suzen JINNY Hasten, MSW    If patient is discharged prior to next notation, then this note serves as note for discharge by case management.    Patient Name: Jodi Walls                   Date of Birth: 08/27/1940  Diagnosis: Dehydration [E86.0]  AKI (acute kidney injury) (HCC) [N17.9]  Acute renal failure superimposed on stage 4 chronic kidney disease, unspecified acute renal failure type (HCC) [N17.9, N18.4]                   Date / Time: 01/16/2023 11:31 AM    Patient Admission Status: Inpatient   Readmission Risk (Low < 19, Mod (19-27), High > 27): Readmission Risk Score: 16.5    Current PCP: [name], MD  PCP verified by CM? (P) Yes    Chart Reviewed: Yes      History Provided by: (P) Patient, Child/Family  Patient Orientation: (P) Alert and Oriented, Person, Place, Situation, Self    Patient Cognition: (P) Alert    Hospitalization in the last 30 days (Readmission):  No    If yes, Readmission Assessment in CM Navigator will be completed.    Advance Directives:      Code Status: Full Code   Patient's Primary Decision Maker is: (P) Legal Next of Kin    Primary Decision MakerBETHA Billy Castilla - Child - [phone]    Secondary Decision Maker: Alisa No - Child [phone]    Secondary Decision Maker: Arnaldo Bookbinder - Child [phone]    Discharge Planning:    Patient lives with: (P) Family Members (Grandson lives with pt but is not helpful with care per dgtrs report) Type of Home: (P) Trailer/Mobile Home (2 STE)  Primary Care Giver: (P) Self  Patient Support Systems include: (P) Children   Current Financial resources: (P) Medicare  Current community resources: (P) ECF/Home Care (Active with [facility] for PT/OT/RN)  Current services prior to admission: (P) Home Care (Active with [facility] PTA)            Current DME:              Type of Home Care services:  (P) OT, PT, Nursing  Services    ADLS  Prior functional level: (P) Assistance with the following:, Shopping, Other (see comment) (Laundry)  Current functional level: (P) Assistance with the following:, Bathing, Cooking, Housework, Shopping, Other (see comment) Occupational Psychologist)    PT AM-PAC: 19 /24  OT AM-PAC: 17 /24    Family can provide assistance at DC: (P) Yes  Would you like Case Management to discuss the discharge plan with any other family members/significant others, and if so, who? (P) Yes (Pt is agreeable to this SW talking with her dgtrs, Amy and [employer])  Plans to Return to Present Housing: (P) Yes  Other Identified Issues/Barriers to RETURNING to current housing: None noted  Potential Assistance needed at discharge: (P) Home Care            Potential DME: No - Pt has a walker, cane, w/c, shower chair and grab bars in the shower  Patient expects to discharge to: (P) Trailer/mobile home  Plan for transportation at discharge: (P) Family    Financial    Payor: BCBS MEDICARE / Plan: ANTHEM MEDIBLUE ESSENTIAL/PLUS / Product Type: *No Product type* /     Does  insurance require precert for SNF: Yes    Potential assistance Purchasing Medications: (P) No  Meds-to-Beds request:        [id number] - [place], [place] - [address] - P [phone] [place] [phone]  [address]  [place] [address]  Phone: [phone] Fax: [phone]    [facility] [address] [place] - [address] [place] [address] - P [phone] [place] [phone]  [address]  [place] [address]  Phone: [phone] Fax: [phone]    [facility] - [place], [place] - [address] - [place] [phone] [place] [phone]  [address]  [place] [address]  Phone: [phone] Fax: [phone]      Notes:    Factors facilitating achievement of predicted outcomes: Family support, Motivated, Cooperative, Pleasant, Good insight into deficits, and Has needed Durable Medical Equipment at home    Barriers to discharge: None  noted    Additional Case Management Notes: Pt resides in a modular home with her grandson.  There are 2 STE.  Pt reported being d/cd from [facility] last week and was set up with [facility] for PT/OT/RN.  Pts dgtr was present in the room and reported pt was needing assistance with cleaning, cooking, grocery shopping, and bathing.  Pts dgtr stated a referral was initiated through [facility] and pt had an in home assessment but she is not sure what the next steps are.  SW contacted [facility] [phone], SW was unable to get in touch with anyone so a new Fast Track referral was initiated.   Pt would like to resume [facility] upon d/c and stated her dgtr will transport her home at d/c.     PLAN: Pt is from home with [facility]  Pt plans to return home at d/c and dgtr will transport pt home.  Fast Track referral to [facility] initiated.     The Plan for Transition of Care is related to the following treatment goals of Dehydration [E86.0]  AKI (acute kidney injury) (HCC) [N17.9]  Acute renal failure superimposed on stage 4 chronic kidney disease, unspecified acute renal failure type (HCC) [N17.9, N18.4]    IF APPLICABLE: The Patient and/or patient representative Sheryl and her family were provided with a choice of provider and agrees with the discharge plan. Freedom of choice list with basic dialogue that supports the patient's individualized plan of care/goals and shares the quality data associated with the providers was provided to: (P) Patient   Patient Representative Name:       The Patient and/or Patient Representative Agree with the Discharge Plan? (P) Yes    Suzen JINNY Hasten, MSW  Case Management Department  Ph: [phone] Fax: [phone]  Electronically signed by Suzen JINNY Hasten, MSW on 01/17/2023 at 11:50 AM       01/17/23 1143   Service Assessment   Patient Orientation Alert and Oriented;Person;Place;Situation;Self   Cognition Alert   History Provided By Patient;Child/Family   Primary Caregiver Self   Accompanied By/Relationship  dgtr, Amy   Support Systems Children   Patient's Healthcare Decision Maker is: Legal Next of Kin   PCP Verified by CM Yes   Last Visit to PCP Within last 3 months   Prior Functional Level Assistance with the following:;Shopping;Other (see comment)  (Laundry)   Current Functional Level Assistance with the following:;Bathing;Cooking;Housework;Shopping;Other (see comment)  (Laundry)   Can patient return to prior living arrangement Yes   Ability to make needs  known: Good   Family able to assist with home care needs: Yes   Would you like for me to discuss the discharge plan with any other family members/significant others, and if so, who? Yes  (Pt is agreeable to this SW talking with her dgtrs, Amy and [employer])   Architect [employer] ECF/Home Care  (Active with [facility] for PT/OT/RN)   CM/SW Referral Other (see comment)  (D/c planning needs)   Discharge Planning   Type of Residence Trailer/Mobile Home  (2 STE)   Living Arrangements Family Members  (Grandson lives with pt but is not helpful with care per dgtrs report)   Current Services Prior To Admission Home Care  (Active with [facility] PTA)   Potential Assistance Needed Home Care   DME Ordered? No   Potential Assistance Purchasing Medications No   Type of Home Care Services OT;PT;Nursing Services   Patient expects to be discharged to: Trailer/mobile home   One/Two Story Residence One story   History of falls? 0   Services At/After Discharge   Transition of Care Consult (CM Consult) N/A   Services At/After Discharge Home Health   Veteran Resource Information Provided? No   Confirm Follow Up Transport Family   Condition of Participation: Discharge Planning   The Plan for Transition of Care is related to the following treatment goals: Strengthening   The Patient and/or Patient Representative was provided with a Choice of Provider? Patient   The Patient and/Or Patient Representative agree with the Discharge Plan? Yes   Freedom of Choice list was provided  with basic dialogue that supports the patient's individualized plan of care/goals, treatment preferences, and shares the quality data associated with the providers?  Yes

## 2023-01-17 NOTE — Progress Notes (Signed)
 Occupational Therapy  Facility/Department: [facility]  Occupational Therapy Initial Assessment  Should patient be discharged prior to another treatment session, this note shall serve as the discharge summary.       Name: Jodi Walls  DOB: [birth year]  MRN: [id number]  Date of Service: 01/17/2023    Discharge Recommendations:  Patient would benefit from continued therapy after discharge, 2-3 sessions per week, Home with assist PRN          Patient Diagnosis(es): The primary encounter diagnosis was Acute renal failure superimposed on stage 4 chronic kidney disease, unspecified acute renal failure type (HCC). A diagnosis of Dehydration was also pertinent to this visit.  Past Medical History:  has a past medical history of Anemia, Arthritis, Cancer (HCC), Fall, GERD (gastroesophageal reflux disease), Heart abnormality, Hx of blood clots, Hypothyroidism, Kidney failure, PONV (postoperative nausea and vomiting), Seronegative rheumatoid arthritis (HCC), Wears dentures, and Wears glasses.  Past Surgical History:  has a past surgical history that includes Appendectomy; Hysterectomy; Mastectomy, radical (1995); Cholecystectomy (2001); joint replacement (Left); back surgery; lipoma resection; Breast surgery (1995); Breast biopsy; Colonoscopy (04/12/2015); Upper gastrointestinal endoscopy (04/12/2015); arthroplasty (Left, 12/25/2018); Breast surgery (Right, 10/26/2021); and Breast Capsulectomy (Right, 10/26/2021).           Assessment  Performance deficits / Impairments: Decreased functional mobility ;Decreased safe awareness;Decreased balance;Decreased ADL status;Decreased endurance;Decreased strength  Assessment: Pt presents with AKI after recent d/c from SNF from [facility].  Pt returned home with grandson who works 3rd shift and support from [employer].  She reports decreased endurance which required use of w/c and RW for short distances.  She was receiving assist for bathing and dressing.  Pt demonstrated ability to complete bed  mobility with SBA and functional mobility with SBA.  She required mod/max A for LB dressing and is on a catheter currently for toileting.  Pt would benefit from cont OT to increase her independence with self care and functional mobility.  Recommend d/c home with HHOT and assist of family.  Prognosis: Good  Decision Making: Medium Complexity  History: See above  Exam: mobility, self care  Assistance / Modification: RW  REQUIRES OT FOLLOW-UP: Yes  Activity Tolerance  Activity Tolerance: Patient Tolerated treatment well     Plan  Occupational Therapy Plan  Times Per Week: 3-5  Times Per Day: Once a day  Days Per Week: 5 Days  Current Treatment Recommendations: Balance training, Functional mobility training, Equipment evaluation, education, & procurement, Self-Care / ADL, Home management training, Patient/Caregiver education & training, Endurance training    Restrictions  Restrictions/Precautions  Restrictions/Precautions: Fall Risk, Weight Bearing (High fall risk)  Lower Extremity Weight Bearing Restrictions  Right Lower Extremity Weight Bearing: Weight Bearing As Tolerated    Subjective  General  Chart Reviewed: Yes, Orders  Patient assessed for rehabilitation services?: Yes  Additional Pertinent Hx: Per [name], PA, CALLISTA HOH is a 82 y.o. female who presents to the emergency department at request of PCP with acute renal insufficiency/renal failure.  Patient is followed by Dr. Laquetta nephrology.  Dr. Janit is on call this facility.     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  Patient states stable.  Patient last renal panel on October 18, 2022 showing a BUN 20, creatinine 1.4 and GFR 37.  Today 01/16/2023 BUN 66, creatinine 4.1 and GFR 10.  Unclear reason.  Consider dehydration.     The patient indicates that 12/24/2022 [facility]  48  hours after she had PE x 2 right lung.  She is currently on Eliquis  5 mg twice daily.  No complications related.  No history of DVT or  PE.  Believe this to be a clot related to the recent surgery.     Patient was discharged from [facility] went to [facility] rehab facility and she was discharged on Friday doing relatively well.  However she indicates a steady decline over the past 4-5 days.     Patient with occasional nausea and occasional shortness of breath.  No chest pain is being reported.  Family / Caregiver Present: No  Referring Practitioner: Dr. Marce  Diagnosis: AKI  Subjective  Subjective: Seen in room, agreed to OT/PT, complained of pain 3-4/10     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 Scientist  Home Layout: One level  Home Access: Stairs to enter with rails  Entrance Stairs - Number of Steps: 2  Entrance Stairs - Rails: Both  Bathroom Shower/Tub: Event Organiser: Grab bars in shower, Paediatric nurse, Engineer, Drilling Accessibility: Accessible  Home Equipment: Designer, Industrial/product, Wheelchair - Manual  ADL Assistance: Independent  Homemaking Assistance: Independent  Ambulation Assistance: Independent (With [employer])  Transfer Assistance: Independent (With RW)  Active Driver: Yes  Mode of Transportation: Car  Additional Comments: Pt recently discharged home from [facility] and required assistance with ADLs and IADLs from daughter. Pt has been using w/c intermittently in home.    Objective       Observation/Palpation  Posture:  (Mild forward head, rounded shoulders, and increased thoracic kyphosis in sitting and standing.)  Observation: Pt on RA on arrival. R knee incision is clean, dry, and intact.  Safety Devices  Type of Devices: All fall risk precautions in place;Call light within reach;Chair alarm in place;Left in chair  Restraints  Restraints Initially in Place: No           ADL  Feeding: Setup  LE Dressing Skilled Clinical Factors: Pt declined LB dressing and reports family have been assisting at home  over the last week  Toileting: Dependent/Total  Toileting Skilled Clinical Factors: Foley cath  Functional Mobility: Stand by assistance  Functional Mobility Skilled Clinical Factors: Amb with SBA with RW, no LOB.  Occasional reminders for hand placement     Activity Tolerance  Activity Tolerance Comments: Pt tolerated the PT initial evaluation well with no significant limitations.     Transfers  Sit to stand: Stand by assistance  Stand to sit: Stand by assistance  Vision  Vision: Impaired  Vision Exceptions: Wears glasses for reading  Hearing  Hearing: Within functional limits  Cognition  Overall Cognitive Status: WFL  Orientation  Overall Orientation Status: Within Normal Limits  Orientation Level: Oriented X4                  Education Given To: Patient  Education Provided: Role of Therapy;Plan of Care  Education Method: Demonstration;Verbal  Barriers to Learning: None  Education Outcome: Verbalized understanding;Demonstrated understanding  LUE AROM (degrees)  LUE AROM : WFL  RUE AROM (degrees)  RUE AROM : WFL                     OutComes Score   KEIMANI LAUFER scored a 17/24 on the AM-PAC ADL Inpatient form. Current research shows that an AM-PAC score of 18 or greater is typically  associated with a discharge to the patient's home setting. Based on the patient's AM-PAC score, and their current ADL deficits, it is recommended that the patient have 2-3 sessions per week of Occupational Therapy at d/c to increase the patient's independence.  At this time, this patient demonstrates the endurance and safety to discharge home with home services and a follow up treatment frequency of 2-3x/wk.   Please see assessment section for further patient specific details.    If patient discharges prior to next session this note will serve as a discharge summary.  Please see below for the latest assessment towards goals.                                                  AM-PAC - ADL  AM-PAC Daily Activity - Inpatient   How much  help is needed for putting on and taking off regular lower body clothing?: A Lot  How much help is needed for bathing (which includes washing, rinsing, drying)?: A Lot  How much help is needed for toileting (which includes using toilet, bedpan, or urinal)?: Total  How much help is needed for putting on and taking off regular upper body clothing?: None  How much help is needed for taking care of personal grooming?: None  How much help for eating meals?: None  AM-PAC Inpatient Daily Activity Raw Score: 17  AM-PAC Inpatient ADL T-Scale Score : 37.26  ADL Inpatient CMS 0-100% Score: 50.11  ADL Inpatient CMS G-Code Modifier : CK         Goals  Short Term Goals  Time Frame for Short Term Goals: less than one week  Short Term Goal 1: Pt will be mod I with functional transfers  Short Term Goal 2: Pt will be mod I with functional mobility  Short Term Goal 3: Pt will be mod I with toileting  Short Term Goal 4: Pt will be mod I with bathing and dressing  Short Term Goal 5: Pt will be mod I with bed mobility  Patient Goals   Patient goals : To return home wiht family assist      Therapy Time   Individual Concurrent Group Co-treatment   Time In 0753         Time Out 0835         Minutes 42         Timed Code Treatment Minutes: 42 Minutes       Lamarr Earnie Jubilee, [place]

## 2023-01-18 LAB — CBC
Hematocrit: 29.7 % — ABNORMAL LOW (ref 36.0–48.0)
Hemoglobin: 10 g/dL — ABNORMAL LOW (ref 12.0–16.0)
MCH: 29.6 pg (ref 26.0–34.0)
MCHC: 33.7 g/dL (ref 31.0–36.0)
MCV: 87.8 fL (ref 80.0–100.0)
MPV: 9.7 fL (ref 5.0–10.5)
Platelets: 169 10*3/uL (ref 135–450)
RBC: 3.38 M/uL — ABNORMAL LOW (ref 4.00–5.20)
RDW: 16.5 % — ABNORMAL HIGH (ref 12.4–15.4)
WBC: 5.8 10*3/uL (ref 4.0–11.0)

## 2023-01-18 LAB — COMPREHENSIVE METABOLIC PANEL
ALT: 9 U/L — ABNORMAL LOW (ref 10–40)
AST: 25 U/L (ref 15–37)
Albumin/Globulin Ratio: 1.5 (ref 1.1–2.2)
Albumin: 3.1 g/dL — ABNORMAL LOW (ref 3.4–5.0)
Alkaline Phosphatase: 94 U/L (ref 40–129)
Anion Gap: 15 (ref 3–16)
BUN: 39 mg/dL — ABNORMAL HIGH (ref 7–20)
CO2: 20 mmol/L — ABNORMAL LOW (ref 21–32)
Calcium: 9.1 mg/dL (ref 8.3–10.6)
Chloride: 102 mmol/L (ref 99–110)
Creatinine: 2.3 mg/dL — ABNORMAL HIGH (ref 0.6–1.2)
Est, Glom Filt Rate: 21 — AB (ref >60)
Glucose: 87 mg/dL (ref 70–99)
Potassium: 4.2 mmol/L (ref 3.5–5.1)
Sodium: 137 mmol/L (ref 136–145)
Total Bilirubin: 0.7 mg/dL (ref 0.0–1.0)
Total Protein: 5.2 g/dL — ABNORMAL LOW (ref 6.4–8.2)

## 2023-01-18 LAB — CREATININE
Creatinine: 2.2 mg/dL — ABNORMAL HIGH (ref 0.6–1.2)
Est, Glom Filt Rate: 22 — AB (ref >60)

## 2023-01-18 MED FILL — OXYCODONE HCL 5 MG PO TABS: 5 MG | ORAL | Qty: 1

## 2023-01-18 MED FILL — PANTOPRAZOLE SODIUM 40 MG PO TBEC: 40 MG | ORAL | Qty: 1

## 2023-01-18 MED FILL — SODIUM BICARBONATE 8.4 % IV SOLN: 8.4 % | INTRAVENOUS | Qty: 75

## 2023-01-18 MED FILL — ELIQUIS 5 MG PO TABS: 5 MG | ORAL | Qty: 1

## 2023-01-18 MED FILL — ASPIRIN 81 MG PO TBEC: 81 MG | ORAL | Qty: 1

## 2023-01-18 MED FILL — SYNTHROID 100 MCG PO TABS: 100 MCG | ORAL | Qty: 1

## 2023-01-18 MED FILL — PREDNISONE 5 MG PO TABS: 5 MG | ORAL | Qty: 1

## 2023-01-18 MED FILL — VITAMIN B-12 1000 MCG PO TABS: 1000 MCG | ORAL | Qty: 3

## 2023-01-18 NOTE — Progress Notes (Signed)
 Physician Progress Note      PATIENT:               Jodi Walls, Jodi Walls  CSN #:                  [id number]  DOB:                       1940-10-09  ADMIT DATE:       01/16/2023 11:31 AM  DISCH DATE:  RESPONDING  PROVIDER #:        Elexia Friedt MD          QUERY TEXT:    Pt admitted with AKI and has CHF documented. If possible, please document in   progress notes and discharge summary further specificity regarding the type   and acuity of CHF:    The medical record reflects the following:  Risk Factors: Age 82yo. Hx- CKD3, CHF, Breast cancer, Heart abnormality  Clinical Indicators: BNP 5356.SABRASABRACXR-  No acute cardiopulmonary process...SABRASABRAPer   Nephrology consult on 01/16/23- history of congestive heart failure recent   left cath shows no coronary artery disease EF 32%  Treatment: BNP, Monitor, NAS diet, I&O    Thank You,  Olam Goody RN BSN CDS CRCR  [email or web address]  Options provided:  -- Chronic Systolic CHF/HFrEF  -- Other - I will add my own diagnosis  -- Disagree - Not applicable / Not valid  -- Disagree - Clinically unable to determine / Unknown  -- Refer to Clinical Documentation Reviewer    PROVIDER RESPONSE TEXT:    This patient has chronic systolic CHF/HFrEF.    Query created by: Goody Olam on 01/18/2023 8:09 AM      Electronically signed by:  Suzan Manzanilla MD 01/18/2023 4:23 PM

## 2023-01-18 NOTE — Progress Notes (Signed)
 [facility]  Phone: [phone]  Fax: [phone]  [email or web address]         Reason for Consult: AKI on CKD stage III      History Obtained From:  patient, daughter, electronic medical record    History of Present Ilness: 82 year old white female with history of chronic kidney disease stage III and follows with Dr. Annia. Baseline creatinine ~ 1.5 mg. 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  History of congestive heart failure she was hospitalized at [facility] for right knee arthroplasty.  Postop course was complicated by pulmonary emboli and congestive heart failure. She underwent CT angiogram on 8/29. Also underwent left heart cath on 8/30 which showed no coronary artery disease but EF was down to 32%.  Patient was initiated on Entresto  and metoprolol in addition to Lasix .  She was discharged to skilled nursing facility.  She came home from the nursing home on 13 September and since then has not been feeling well. She has been weak and complaining of shortness of breath. Her blood pressure has been running as low as in the 60s systolic  She has had poor oral intake with nausea requiring Zofran   Reports decreased urine output    No report of dysuria or hematuria. She is complaining of back pain  Denies use of nonsteroidal anti-inflammatory drugs  She saw her primary care physician Dr. Cyrena yesterday and was noted to be hypotensive. Labs done yesterday revealed a creatinine of 3.3 mg and she was asked to come to the ER.  Here she is found to have creatinine of 4.1 BUN of 66 and bicarb of 18      Review of Systems:   Labs and chart reviewed. Patient resting in bed on RA. Feeling better today. Eating lunch   Family present       Physical exam:   Constitutional:  VITALS:  BP 100/60   Pulse 73   Temp 98.6 F (37 C) (Oral)   Resp 16   Ht 1.524 m (5')   Wt 75.9 kg (167 lb 5.3 oz)   SpO2 95%   BMI 32.68 kg/m     Gen: alert, awake, nad elderly  Skin: no rash, turgor  wnl  Heent:  eomi, mmm  Neck: no bruits or jvd noted, thyroid normal  Cardiovascular:  S1, S2 without m/r/g  Respiratory: CTA B without w/r/r; respiratory effort normal  Abdomen:  +bs, soft, nt, nd, no hepatosplenomegaly  Ext: No lower extremity edema  Psychiatric: mood and affect appropriate; judgement and insight intact      Data/  CBC:   Lab Results   Component Value Date/Time    WBC 5.8 01/18/2023 08:27 AM    RBC 3.38 01/18/2023 08:27 AM    RBC 4.72 10/26/2015 11:32 AM    HGB 10.0 01/18/2023 08:27 AM    HCT 29.7 01/18/2023 08:27 AM    MCV 87.8 01/18/2023 08:27 AM    MCH 29.6 01/18/2023 08:27 AM    MCHC 33.7 01/18/2023 08:27 AM    RDW 16.5 01/18/2023 08:27 AM    PLT 169 01/18/2023 08:27 AM    MPV 9.7 01/18/2023 08:27 AM     BMP:    Lab Results   Component Value Date/Time    NA 137 01/18/2023 08:27 AM    K 4.2 01/18/2023 08:27 AM    K 4.4 01/17/2023 04:35 AM    CL 102 01/18/2023 08:27 AM    CO2 20 01/18/2023 08:27 AM  BUN 39 01/18/2023 08:27 AM    CREATININE 2.2 01/18/2023 10:59 AM    CALCIUM 9.1 01/18/2023 08:27 AM    GFRAA 33 01/03/2021 02:51 PM    GFRAA 48 10/11/2011 03:17 PM    LABGLOM 22 01/18/2023 10:59 AM    LABGLOM 45 10/17/2021 12:25 PM    GLUCOSE 87 01/18/2023 08:27 AM    GLUCOSE 109 10/26/2015 11:32 AM         Assessment/Plan   1-AKI on CKD stage III suspect hemodynamic in nature in the setting of hypotension in the setting of ARB use. Patient did undergo CT angiogram and left heart cath at the end of August but her creatinine was at baseline upon discharge Cr improving to 2.2 mg today Continue IVF's and monitor   2-CKD stage III follows with Dr. Annia. Baseline Cr ~ 1.5 mg  3-History of congestive heart failure recent left cath shows no coronary artery disease EF 32%  4 Recent pulmonary emboli on Eliquis   5 Hypotension -- suspect volume depletion and medication effect BP improving Keep holding Entresto , metoprolol and Lasix  at this time  6 Metabolic acidosis anion gap improving on IVF's containing  Nahco3   7 Anemia HGB 10 gm fe sat 19%  Fe 43 adequate       Will discuss with nephrology attending physician, Dr. Janit.  See attestation for additional recommendations.    Kristi Janit, MD

## 2023-01-18 NOTE — Plan of Care (Signed)
 Problem: Discharge Planning  Goal: Discharge to home or other facility with appropriate resources  01/18/2023 0939 by Dolph Longs, RN  Outcome: Progressing  01/17/2023 2030 by Marcie Erm, RN  Outcome: Progressing  01/17/2023 1945 by Dedrick Darner, RN  Outcome: Progressing  Flowsheets (Taken 01/17/2023 0900)  Discharge to home or other facility with appropriate resources:   Identify barriers to discharge with patient and caregiver   Arrange for needed discharge resources and transportation as appropriate   Identify discharge learning needs (meds, wound care, etc)     Problem: Pain  Goal: Verbalizes/displays adequate comfort level or baseline comfort level  01/18/2023 0939 by Dolph Longs, RN  Outcome: Progressing  01/17/2023 2030 by Marcie Erm, RN  Outcome: Progressing  01/17/2023 1945 by Dedrick Darner, RN  Outcome: Progressing  Flowsheets (Taken 01/17/2023 0715)  Verbalizes/displays adequate comfort level or baseline comfort level:   Encourage patient to monitor pain and request assistance   Assess pain using appropriate pain scale   Administer analgesics based on type and severity of pain and evaluate response   Implement non-pharmacological measures as appropriate and evaluate response   Consider cultural and social influences on pain and pain management   Notify Licensed Independent Practitioner if interventions unsuccessful or patient reports new pain     Problem: Skin/Tissue Integrity  Goal: Absence of new skin breakdown  Description: 1.  Monitor for areas of redness and/or skin breakdown  2.  Assess vascular access sites hourly  3.  Every 4-6 hours minimum:  Change oxygen saturation probe site  4.  Every 4-6 hours:  If on nasal continuous positive airway pressure, respiratory therapy assess nares and determine need for appliance change or resting period.  01/18/2023 0939 by Dolph Longs, RN  Outcome: Progressing  01/17/2023 2030 by Marcie Erm, RN  Outcome: Progressing  01/17/2023  1945 by Dedrick Darner, RN  Outcome: Progressing     Problem: ABCDS Injury Assessment  Goal: Absence of physical injury  01/18/2023 0939 by Dolph Longs, RN  Outcome: Progressing  01/17/2023 2030 by Marcie Erm, RN  Outcome: Progressing  01/17/2023 1945 by Dedrick Darner, RN  Outcome: Progressing  Flowsheets (Taken 01/17/2023 0900)  Absence of Physical Injury: Implement safety measures based on patient assessment     Problem: Safety - Adult  Goal: Free from fall injury  01/18/2023 0939 by Dolph Longs, RN  Outcome: Progressing  01/17/2023 2030 by Marcie Erm, RN  Outcome: Progressing  01/17/2023 1945 by Dedrick Darner, RN  Outcome: Progressing  Flowsheets (Taken 01/17/2023 0900)  Free From Fall Injury: Instruct family/caregiver on patient safety     Problem: Respiratory - Adult  Goal: Achieves optimal ventilation and oxygenation  01/18/2023 0939 by Dolph Longs, RN  Outcome: Progressing  01/17/2023 2030 by Marcie Erm, RN  Outcome: Progressing  01/17/2023 1945 by Dedrick Darner, RN  Outcome: Progressing  Flowsheets (Taken 01/17/2023 0900)  Achieves optimal ventilation and oxygenation:   Oxygen supplementation based on oxygen saturation or arterial blood gases   Assess for changes in respiratory status   Assess for changes in mentation and behavior   Position to facilitate oxygenation and minimize respiratory effort   Encourage broncho-pulmonary hygiene including cough, deep breathe, incentive spirometry   Assess and instruct to report shortness of breath or any respiratory difficulty     Problem: Cardiovascular - Adult  Goal: Maintains optimal cardiac output and hemodynamic stability  01/18/2023 0939 by Dolph Longs, RN  Outcome: Progressing  01/17/2023 2030 by Marcie Erm, RN  Outcome: Progressing  01/17/2023 1945 by Odhiambo, Roseline, RN  Outcome: Progressing  Flowsheets (Taken 01/17/2023 0900)  Maintains optimal cardiac output and hemodynamic stability:   Monitor blood pressure  and heart rate   Assess for signs of decreased cardiac output  Goal: Absence of cardiac dysrhythmias or at baseline  01/18/2023 0939 by Dolph Longs, RN  Outcome: Progressing  01/17/2023 2030 by Marcie Erm, RN  Outcome: Progressing  01/17/2023 1945 by Dedrick Darner, RN  Outcome: Progressing     Problem: Musculoskeletal - Adult  Goal: Return mobility to safest level of function  01/18/2023 0939 by Dolph Longs, RN  Outcome: Progressing  01/17/2023 2030 by Marcie Erm, RN  Outcome: Progressing  01/17/2023 1945 by Dedrick Darner, RN  Outcome: Progressing  Goal: Maintain proper alignment of affected body part  01/18/2023 0939 by Dolph Longs, RN  Outcome: Progressing  01/17/2023 2030 by Marcie Erm, RN  Outcome: Progressing  01/17/2023 1945 by Dedrick Darner, RN  Outcome: Progressing  Goal: Return ADL status to a safe level of function  01/18/2023 0939 by Dolph Longs, RN  Outcome: Progressing  01/17/2023 2030 by Marcie Erm, RN  Outcome: Progressing  01/17/2023 1945 by Dedrick Darner, RN  Outcome: Progressing     Problem: Metabolic/Fluid and Electrolytes - Adult  Goal: Electrolytes maintained within normal limits  01/18/2023 0939 by Dolph Longs, RN  Outcome: Progressing  01/17/2023 2030 by Marcie Erm, RN  Outcome: Progressing  01/17/2023 1945 by Dedrick Darner, RN  Outcome: Progressing  Flowsheets (Taken 01/17/2023 0900)  Electrolytes maintained within normal limits:   Monitor labs and assess patient for signs and symptoms of electrolyte imbalances   Administer electrolyte replacement as ordered   Monitor response to electrolyte replacements, including repeat lab results as appropriate  Goal: Hemodynamic stability and optimal renal function maintained  01/18/2023 0939 by Dolph Longs, RN  Outcome: Progressing  01/17/2023 2030 by Marcie Erm, RN  Outcome: Progressing  01/17/2023 1945 by Dedrick Darner, RN  Outcome: Progressing  Flowsheets (Taken 01/17/2023  0900)  Hemodynamic stability and optimal renal function maintained:   Monitor labs and assess for signs and symptoms of volume excess or deficit   Monitor intake, output and patient weight   Monitor response to interventions for patient's volume status, including labs, urine output, blood pressure (other measures as available)  Goal: Glucose maintained within prescribed range  01/18/2023 0939 by Dolph Longs, RN  Outcome: Progressing  01/17/2023 2030 by Marcie Erm, RN  Outcome: Progressing  01/17/2023 1945 by Dedrick Darner, RN  Outcome: Progressing  Flowsheets (Taken 01/17/2023 0900)  Glucose maintained within prescribed range: Monitor blood glucose as ordered     Problem: Hematologic - Adult  Goal: Maintains hematologic stability  01/18/2023 0939 by Dolph Longs, RN  Outcome: Progressing  01/17/2023 2030 by Marcie Erm, RN  Outcome: Progressing  01/17/2023 1945 by Dedrick Darner, RN  Outcome: Progressing  Flowsheets (Taken 01/17/2023 0900)  Maintains hematologic stability: Assess for signs and symptoms of bleeding or hemorrhage     Problem: Nutrition Deficit:  Goal: Optimize nutritional status  01/18/2023 0939 by Dolph Longs, RN  Outcome: Progressing  01/17/2023 2030 by Marcie Erm, RN  Outcome: Progressing  01/17/2023 1945 by Dedrick Darner, RN  Outcome: Progressing

## 2023-01-18 NOTE — Progress Notes (Signed)
 Occupational Therapy  Facility/Department: [facility]  Occupational Therapy Daily Treatment Note  This note to serve as OT d/c summary if pt is d/c-ed from hospital prior to next OT session.      Name: Jodi Walls  DOB: 07/31/1940  MRN: [id number]  Date of Service: 01/18/2023    Discharge Recommendations:  Patient would benefit from continued therapy after discharge, 2-3 sessions per week, Home with assist PRN          Patient Diagnosis(es): The primary encounter diagnosis was Acute renal failure superimposed on stage 4 chronic kidney disease, unspecified acute renal failure type (HCC). A diagnosis of Dehydration was also pertinent to this visit.  Past Medical History:  has a past medical history of Anemia, Arthritis, Cancer (HCC), Fall, GERD (gastroesophageal reflux disease), Heart abnormality, Hx of blood clots, Hypothyroidism, Kidney failure, PONV (postoperative nausea and vomiting), Seronegative rheumatoid arthritis (HCC), Wears dentures, and Wears glasses.  Past Surgical History:  has a past surgical history that includes Appendectomy; Hysterectomy; Mastectomy, radical (1995); Cholecystectomy (2001); joint replacement (Left); back surgery; lipoma resection; Breast surgery (1995); Breast biopsy; Colonoscopy (04/12/2015); Upper gastrointestinal endoscopy (04/12/2015); arthroplasty (Left, 12/25/2018); Breast surgery (Right, 10/26/2021); and Breast Capsulectomy (Right, 10/26/2021).           Assessment  Performance deficits / Impairments: Decreased functional mobility ;Decreased safe awareness;Decreased balance;Decreased ADL status;Decreased endurance;Decreased strength  Assessment: Pt presents with AKI after recent d/c from SNF from [facility].  Pt returned home with grandson who works 3rd shift and support from [employer].  She reports decreased endurance which required use of w/c and RW for short distances.  She was receiving assist for bathing and dressing.  Pt demonstrated ability to complete bed mobility with SBA  and functional mobility with SBA.  She stood at the sink with SBA to complete grooming tasks.  Pt would benefit from cont OT to increase her independence with self care and functional mobility.  Recommend d/c home with HHOT and assist of family.  Prognosis: Good  Activity Tolerance  Activity Tolerance: Patient Tolerated treatment well     Plan  Occupational Therapy Plan  Times Per Week: 3-5  Times Per Day: Once a day  Days Per Week: 5 Days  Current Treatment Recommendations: Balance training, Functional mobility training, Equipment evaluation, education, & procurement, Self-Care / ADL, Home management training, Patient/Caregiver education & training, Endurance training    Restrictions  Restrictions/Precautions  Restrictions/Precautions: Fall Risk, Weight Bearing  Lower Extremity Weight Bearing Restrictions  Right Lower Extremity Weight Bearing: Weight Bearing As Tolerated    Subjective  General  Chart Reviewed: Yes, Progress Notes  Patient assessed for rehabilitation services?: Yes  Additional Pertinent Hx: Per Jodi Capers, PA, Jodi Walls is a 82 y.o. female who presents to the emergency department at request of PCP with acute renal insufficiency/renal failure.  Patient is followed by Dr. Laquetta nephrology.  Dr. Janit is on call this facility.     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  Patient states stable.  Patient last renal panel on October 18, 2022 showing a BUN 20, creatinine 1.4 and GFR 37.  Today 01/16/2023 BUN 66, creatinine 4.1 and GFR 10.  Unclear reason.  Consider dehydration.     The patient indicates that 12/24/2022 she [facility]  48 hours after she had PE x 2 right lung.  She is currently on Eliquis  5 mg twice daily.  No complications related.  No history of DVT  or PE.  Believe this to be a clot related to the recent surgery.     Patient was discharged from [facility] went to [facility] and she was discharged on Friday doing  relatively well.  However she indicates a steady decline over the past 4-5 days.     Patient with occasional nausea and occasional shortness of breath.  No chest pain is being reported.  Family / Caregiver Present: No  Referring Practitioner: Jodi Walls  Diagnosis: AKI  Subjective  Subjective: Pt seen bedside and agreed to OT treatment.     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 Scientist  Home Layout: One level  Home Access: Stairs to enter with rails  Entrance Stairs - Number of Steps: 2  Entrance Stairs - Rails: Both  Bathroom Shower/Tub: Event Organiser: Grab bars in shower, Paediatric nurse, Engineer, Drilling Accessibility: Accessible  Home Equipment: Designer, Industrial/product, Wheelchair - Manual  ADL Assistance: Independent  Homemaking Assistance: Independent  Ambulation Assistance: Independent (With [employer])  Transfer Assistance: Independent (With RW)  Active Driver: Yes  Mode of Transportation: Car  Additional Comments: Pt recently discharged home from [facility] and required assistance with ADLs and IADLs from daughter. Pt has been using w/c intermittently in home.    Objective             Safety Devices  Type of Devices: Call light within reach;Bed alarm in place;Left in bed;Gait belt     Toilet Transfers  Toilet Transfers Comments: Declined need to use the toilet.  Wheelchair Bed Transfers  Wheelchair/Bed - Technique: Ambulating  Equipment Used: Other;Bed (chair with arms)  Level of Asssistance: Stand by assistance     ADL  Grooming: Stand by assistance  Grooming Skilled Clinical Factors: Pt stood at the sink to complete oral care.  She brushed her hair seated at the sink.  Pt required one seated rest break at the sink due to fatigue.  Toileting: Dependent/Total  Toileting Skilled Clinical Factors: Foley cath  Functional Mobility Skilled Clinical Factors: Completed mobility bed to sink 10 feet, and  then returned to the bed after completing grooming.  She used a RW with SBA.  Skin Care: Bath wipes;Incontinent cleanser;Protective barrier        Bed mobility  Supine to Sit: Stand by assistance  Sit to Supine: Stand by assistance (Extra time and effort to complete.)  Transfers  Sit to stand: Stand by assistance  Stand to sit: Stand by assistance     Cognition  Overall Cognitive Status: WFL  Orientation  Overall Orientation Status: Within Normal Limits  Orientation Level: Oriented X4                                        AM-PAC - ADL  AM-PAC Daily Activity - Inpatient   How much help is needed for putting on and taking off regular lower body clothing?: A Lot  How much help is needed for bathing (which includes washing, rinsing, drying)?: A Lot  How much help is needed for toileting (which includes using toilet, bedpan, or urinal)?: Total  How much help is needed for putting on and taking off regular upper body clothing?: None  How much help is needed for taking care of personal grooming?: None  How much  help for eating meals?: None  AM-PAC Inpatient Daily Activity Raw Score: 17  AM-PAC Inpatient ADL T-Scale Score : 37.26  ADL Inpatient CMS 0-100% Score: 50.11  ADL Inpatient CMS G-Code Modifier : CK      Goals  Short Term Goals  Time Frame for Short Term Goals: less than one week  Short Term Goal 1: Pt will be mod I with functional transfers  Short Term Goal 2: Pt will be mod I with functional mobility  Short Term Goal 3: Pt will be mod I with toileting  Short Term Goal 4: Pt will be mod I with bathing and dressing  Short Term Goal 5: Pt will be mod I with bed mobility  Patient Goals   Patient goals : To return home wiht family assist      Therapy Time   Individual Concurrent Group Co-treatment   Time In 1425         Time Out 1505         Minutes [address], C[place]/L 3093

## 2023-01-18 NOTE — Progress Notes (Signed)
 V2.0    [facility] Progress Note      Name:  Jodi Walls DOB/Age/Sex: [birth year]  (82 y.o. female)   MRN & CSN:  [id number] & [id number] Encounter Date/Time: 01/18/2023 12:30 PM EDT   Location:  [id number] PCP: [name], MD     Attending:Heywood Tokunaga, MD       Hospital Day: 3    Assessment and Recommendations   Jodi Walls is a 82 y.o. female with pmh of arthritis, remote history of breast cancer, GERD, hypothyroidism, CKD 3 who presents with AKI (acute kidney injury) (HCC)    Patient examined this morning.  Feeling some improvement.  Patient lives at home with grandson.  Uses a walker at the baseline.  Creatinine continues to improve we will continue to follow monitor with series of labs    Plan:   AKI on CKD 3  IV fluid with sodium bicarb drip  nephrology was consulted  Holding nephrotoxic agents such as numbness Entresto  and diuretics until nephrology recs      2.  Weakness  PT OT to evaluate and recommend    3.  Hypotension  Most likely due to dehydration  Will continue to follow monitor vitals for further management    4.  Remote history of PE  Eliquis  daily      Diet ADULT DIET; Regular; No Added Salt (3-4 gm)  ADULT ORAL NUTRITION SUPPLEMENT; Lunch, Dinner; Standard High Calorie/High Protein Oral Supplement   DVT Prophylaxis []  Lovenox, []   Heparin, []  SCDs, [x]  Ambulation,  [x]  Eliquis , []  Xarelto  []  Coumadin   Code Status Full Code   Disposition From: Home  Expected Disposition: Home  Estimated Date of Discharge: 1-2 days  Patient requires continued admission due to upon clinical improvement   Surrogate Decision Maker/ POA       Personally reviewed Lab Studies and Imaging         Medical Decision Making:  The following items were considered in medical decision making:  Discussion of patient care with other providers  Reviewed clinical lab tests  Reviewed radiology tests  Reviewed other diagnostic tests/interventions  Independent review of radiologic images  Microbiology cultures and other  micro tests reviewed      Subjective:     Chief Complaint: Weakness and abnormal labs was found to have an AKI on CKD    Jodi Walls is a 82 y.o. female who presents with weakness and abnormal labs was found to have an AKI on CKD      Review of Systems:      Pertinent positives and negatives discussed in HPI    Objective:     Intake/Output Summary (Last 24 hours) at 01/18/2023 1340  Last data filed at 01/18/2023 1002  Gross per 24 hour   Intake 360 ml   Output 1150 ml   Net -790 ml      Vitals:   Vitals:    01/18/23 0600 01/18/23 0734 01/18/23 1138 01/18/23 1326   BP:  102/60 116/69    Pulse:  72 72 73   Resp:  17 16 16    Temp:  97.5 F (36.4 C) 98.6 F (37 C)    TempSrc:  Oral Oral    SpO2:  95% 95% 95%   Weight: 75.9 kg (167 lb 5.3 oz)      Height:             Physical Exam:      Physical Exam Performed:  BP 116/69   Pulse 73   Temp 98.6 F (37 C) (Oral)   Resp 16   Ht 1.524 m (5')   Wt 75.9 kg (167 lb 5.3 oz)   SpO2 95%   BMI 32.68 kg/m     General appearance: No apparent distress,cooperative.  HEENT: Pupils equal, round, and reactive to light.   Neck: Supple, with full range of motion. Trachea midline.  Respiratory:  Normal respiratory effort. Clear to auscultation,  Cardiovascular: Regular rate and rhythm with normal S1/S2   Abdomen: Soft, non-tender, non-distended   Musculoskeletal: No dema bilaterally.  Full range of motion without deformity.  Skin: Ecchymosis throughout upper extremities  Neurologic:  Neurovascularly intact without any focal sensory/motor deficits.   Psychiatric: Alert and oriented, thought content appropriate, normal insight  Capillary Refill: Brisk, 3 seconds, normal   Peripheral Pulses: +2 palpable, equal bilaterally       Medications:   Medications:   . apixaban   5 mg Oral BID   . aspirin   81 mg Oral Daily   . vitamin B-12  2,500 mcg Oral Daily   . levothyroxine   100 mcg Oral Daily   . pantoprazole   40 mg Oral Daily   . predniSONE   5 mg Oral Daily   . sodium chloride  flush   5-40 mL IntraVENous 2 times per day      Infusions:   . sodium bicarbonate  75 mEq in sodium chloride  0.45 % 1,000 mL infusion 75 mL/hr at 01/18/23 0045   . sodium chloride        PRN Meds: oxyCODONE , 5 mg, Q4H PRN  sodium chloride  flush, 5-40 mL, PRN  sodium chloride , , PRN  polyethylene glycol, 17 g, Daily PRN  acetaminophen , 650 mg, Q6H PRN   Or  acetaminophen , 650 mg, Q6H PRN        Labs and Imaging   XR CHEST PORTABLE    Result Date: 01/16/2023  EXAMINATION: ONE XRAY VIEW OF THE CHEST 01/16/2023 10:15 am COMPARISON: 10/13/2020 HISTORY: ORDERING SYSTEM PROVIDED HISTORY: Shortness of Breath TECHNOLOGIST PROVIDED HISTORY: Reason for exam:->Shortness of Breath FINDINGS: The lungs appear clear.  There is a large hiatal hernia.  The heart is unremarkable.  Bony structures appear normal.  Visualized upper abdomen appear normal.  Patient had prior lower cervical fusion.     1. No acute cardiopulmonary process. 2. Large hiatal hernia.       CBC:   Recent Labs     01/16/23  1146 01/17/23  0435 01/18/23  0827   WBC 11.2* 5.2 5.8   HGB 12.2 9.7* 10.0*   PLT 310 161 169     BMP:    Recent Labs     01/16/23  1146 01/17/23  0435 01/18/23  0827 01/18/23  1059   NA 134* 134* 137  --    K 5.0 4.4 4.2  --    CL 98* 102 102  --    CO2 18* 19* 20*  --    BUN 66* 57* 39*  --    CREATININE 4.1* 3.4* 2.3* 2.2*   GLUCOSE 112* 85 87  --      Hepatic:   Recent Labs     01/16/23  1746 01/17/23  0435 01/18/23  0827   AST 29 26 25    ALT 12 10 9*   BILITOT 0.8 0.7 0.7   ALKPHOS 104 98 94     Lipids:   Lab Results   Component Value Date/Time    CHOL 207  05/16/2022 02:43 PM    HDL 66 05/16/2022 02:43 PM    HDL 78 04/06/2010 11:33 AM    TRIG 97 05/16/2022 02:43 PM     Hemoglobin A1C:   Lab Results   Component Value Date/Time    LABA1C 5.4 12/10/2022 02:43 PM     TSH:   Lab Results   Component Value Date/Time    TSH 0.74 01/16/2023 05:46 PM     Troponin: No results found for: TROPONINT  Lactic Acid: No results for input(s): LACTA in the last 72  hours.  BNP:   Recent Labs     01/16/23  1746   PROBNP 5,356*     UA:  Lab Results   Component Value Date/Time    NITRU Negative 01/16/2023 12:42 PM    COLORU Yellow 01/16/2023 12:42 PM    PHUR 5.5 01/16/2023 12:42 PM    PHUR 6.5 08/23/2011 11:19 AM    WBCUA 4 01/16/2023 12:42 PM    RBCUA 7 01/16/2023 12:42 PM    BACTERIA None Seen 01/16/2023 12:42 PM    CLARITYU Clear 01/16/2023 12:42 PM    LEUKOCYTESUR TRACE 01/16/2023 12:42 PM    UROBILINOGEN 1.0 01/16/2023 12:42 PM    BILIRUBINUR Negative 01/16/2023 12:42 PM    BLOODU Negative 01/16/2023 12:42 PM    GLUCOSEU Negative 01/16/2023 12:42 PM    GLUCOSEU NEGATIVE 03/15/2009 07:09 PM    KETUA TRACE 01/16/2023 12:42 PM     Urine Cultures: No results found for: LABURIN  Blood Cultures: No results found for: BC  No results found for: BLOODCULT2  Organism: No results found for: [facility]      Electronically signed by Suzan Manzanilla, MD on 01/18/2023 at 1:40 PM  Comment: Please note this report has been produced using speech recognition software and may contain errors related to that system including errors in grammar, punctuation, and spelling, as well as words and phrases that may be inappropriate. If there are any questions or concerns, please feel free to contact the dictating provider for clarification.

## 2023-01-19 LAB — CBC
Hematocrit: 26.4 % — ABNORMAL LOW (ref 36.0–48.0)
Hemoglobin: 9.1 g/dL — ABNORMAL LOW (ref 12.0–16.0)
MCH: 29.6 pg (ref 26.0–34.0)
MCHC: 34.3 g/dL (ref 31.0–36.0)
MCV: 86.4 fL (ref 80.0–100.0)
MPV: 9.7 fL (ref 5.0–10.5)
Platelets: 146 10*3/uL (ref 135–450)
RBC: 3.06 M/uL — ABNORMAL LOW (ref 4.00–5.20)
RDW: 16.3 % — ABNORMAL HIGH (ref 12.4–15.4)
WBC: 4.9 10*3/uL (ref 4.0–11.0)

## 2023-01-19 LAB — COMPREHENSIVE METABOLIC PANEL
ALT: 7 U/L — ABNORMAL LOW (ref 10–40)
AST: 20 U/L (ref 15–37)
Albumin/Globulin Ratio: 1.6 (ref 1.1–2.2)
Albumin: 3 g/dL — ABNORMAL LOW (ref 3.4–5.0)
Alkaline Phosphatase: 80 U/L (ref 40–129)
Anion Gap: 11 (ref 3–16)
BUN: 27 mg/dL — ABNORMAL HIGH (ref 7–20)
CO2: 24 mmol/L (ref 21–32)
Calcium: 8.9 mg/dL (ref 8.3–10.6)
Chloride: 102 mmol/L (ref 99–110)
Creatinine: 1.7 mg/dL — ABNORMAL HIGH (ref 0.6–1.2)
Est, Glom Filt Rate: 29 — AB (ref >60)
Glucose: 94 mg/dL (ref 70–99)
Potassium: 3.5 mmol/L (ref 3.5–5.1)
Sodium: 137 mmol/L (ref 136–145)
Total Bilirubin: 0.5 mg/dL (ref 0.0–1.0)
Total Protein: 4.9 g/dL — ABNORMAL LOW (ref 6.4–8.2)

## 2023-01-19 MED ORDER — SODIUM CHLORIDE 0.9 % IV SOLN
0.9 | INTRAVENOUS | Status: DC
Start: 2023-01-19 — End: 2023-01-20
  Administered 2023-01-19: 23:00:00 via INTRAVENOUS

## 2023-01-19 MED FILL — OXYCODONE HCL 5 MG PO TABS: 5 MG | ORAL | Qty: 1

## 2023-01-19 MED FILL — ELIQUIS 5 MG PO TABS: 5 MG | ORAL | Qty: 1

## 2023-01-19 MED FILL — SODIUM BICARBONATE 8.4 % IV SOLN: 8.4 % | INTRAVENOUS | Qty: 75

## 2023-01-19 MED FILL — VITAMIN B-12 1000 MCG PO TABS: 1000 MCG | ORAL | Qty: 3

## 2023-01-19 MED FILL — ASPIRIN 81 MG PO TBEC: 81 MG | ORAL | Qty: 1

## 2023-01-19 MED FILL — ACETAMINOPHEN 325 MG PO TABS: 325 MG | ORAL | Qty: 2

## 2023-01-19 MED FILL — SYNTHROID 100 MCG PO TABS: 100 MCG | ORAL | Qty: 1

## 2023-01-19 MED FILL — PANTOPRAZOLE SODIUM 40 MG PO TBEC: 40 MG | ORAL | Qty: 1

## 2023-01-19 MED FILL — PREDNISONE 5 MG PO TABS: 5 MG | ORAL | Qty: 1

## 2023-01-19 NOTE — Progress Notes (Addendum)
[facility]  Phone: [phone]  Fax: [phone]  [email or web address]         Reason for Consult: AKI on CKD stage III      History Obtained From:  patient, daughter, electronic medical record    History of Present Ilness: 82 year old white female with history of chronic kidney disease stage III and follows with Dr. Rondel Baton. Baseline creatinine ~ 1.5 mg. 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  History of congestive heart failure she was hospitalized at [facility] for right knee arthroplasty.  Postop course was complicated by pulmonary emboli and congestive heart failure. She underwent CT angiogram on 8/29. Also underwent left heart cath on 8/30 which showed no coronary artery disease but EF was down to 32%.  Patient was initiated on Entresto and metoprolol in addition to Lasix.  She was discharged to skilled nursing facility.  She came home from the nursing home on 13 September and since then has not been feeling well. She has been weak and complaining of shortness of breath. Her blood pressure has been running as low as in the 60s systolic  She has had poor oral intake with nausea requiring Zofran  Reports decreased urine output    No report of dysuria or hematuria. She is complaining of back pain  Denies use of nonsteroidal anti-inflammatory drugs  She saw her primary care physician Dr. Modesto Charon yesterday and was noted to be hypotensive. Labs done yesterday revealed a creatinine of 3.3 mg and she was asked to come to the ER.  Here she is found to have creatinine of 4.1 BUN of 66 and bicarb of 18      Review of Systems:   Labs and chart reviewed. Feels OK  SLID OFF THE TOILET TO THE FLOOR EARLIER  Family present       Physical exam:   Constitutional:  VITALS:  BP 126/71   Pulse 75   Temp 97.6 F (36.4 C) (Oral)   Resp 18   Ht 1.524 m (5')   Wt 77 kg (169 lb 12.1 oz)   SpO2 97%   BMI 33.15 kg/m     Gen: alert, awake, nad elderly  Skin: no rash, turgor wnl  Heent:  eomi,  mmm  Neck: no bruits or jvd noted, thyroid normal  Cardiovascular:  S1, S2 without m/r/g  Respiratory: CTA B without w/r/r; respiratory effort normal  Abdomen:  +bs, soft, nt, nd, no hepatosplenomegaly  Ext: No lower extremity edema      Data/  CBC:   Lab Results   Component Value Date/Time    WBC 4.9 01/19/2023 05:43 AM    RBC 3.06 01/19/2023 05:43 AM    RBC 4.72 10/26/2015 11:32 AM    HGB 9.1 01/19/2023 05:43 AM    HCT 26.4 01/19/2023 05:43 AM    MCV 86.4 01/19/2023 05:43 AM    MCH 29.6 01/19/2023 05:43 AM    MCHC 34.3 01/19/2023 05:43 AM    RDW 16.3 01/19/2023 05:43 AM    PLT 146 01/19/2023 05:43 AM    MPV 9.7 01/19/2023 05:43 AM     BMP:    Lab Results   Component Value Date/Time    NA 137 01/19/2023 05:43 AM    K 3.5 01/19/2023 05:43 AM    K 4.4 01/17/2023 04:35 AM    CL 102 01/19/2023 05:43 AM    CO2 24 01/19/2023 05:43 AM    BUN 27 01/19/2023 05:43 AM    CREATININE 1.7 01/19/2023  05:43 AM    CALCIUM 8.9 01/19/2023 05:43 AM    GFRAA 33 01/03/2021 02:51 PM    GFRAA 48 10/11/2011 03:17 PM    LABGLOM 29 01/19/2023 05:43 AM    LABGLOM 45 10/17/2021 12:25 PM    GLUCOSE 94 01/19/2023 05:43 AM    GLUCOSE 109 10/26/2015 11:32 AM         Assessment/Plan   1-AKI on CKD stage III suspect hemodynamic in nature in the setting of hypotension in the setting of ARB use. Patient did undergo CT angiogram and left heart cath at the end of August but her creatinine was at baseline upon discharge Cr improving to 1.7  mg today Decrease IVF' rate D/W family    2-CKD stage III follows with Dr. Rondel Baton. Baseline Cr ~ 1.5 mg  3-History of congestive heart failure recent left cath shows no coronary artery disease EF 32%  4 Recent pulmonary emboli on Eliquis  5 Hypotension -- suspect volume depletion and medication effect BP improving Keep holding Entresto, metoprolol and Lasix at this time  6 Metabolic acidosis anion gap improved, change IVF's to NS  7 Anemia HGB 9.1 gm fe sat 19%  Fe 43 adequate       Will discuss with nephrology  attending physician, Dr. Lodema Hong.  See attestation for additional recommendations.    Earl Gala, MD

## 2023-01-19 NOTE — Progress Notes (Addendum)
Message left for daughter Jeanice Lim regarding patient fall this morning. [phone].    Patient notified daughter Amy via phone.

## 2023-01-19 NOTE — Progress Notes (Signed)
Spoke to daughter Jeanice Lim on phone regarding patient fall. Holly suggested underwear with pad instead up pull up since the pull ups are tight and difficult to pull off . Jeanice Lim states she will notify Jodie of the fall. Patient still remains free of any obvious injury. Bed alarm on, nonskid footwear on .

## 2023-01-19 NOTE — Progress Notes (Signed)
Patient assisted to bathroom by PCA using FWW. Patient seated on toilet and leaned to her left side to pull her depends off and slipped off the side of the toilet onto the floor. Assisted fall with PCA. RN and MD in room witnessed fall. Assisted back to toilet x 3 person lift, able to ambulate back to bed with FWW. No obvious signs of trauma, skin intact, no redness or bruising. States she feels sore "all over." Did not hit head during fall, fell to left side on arm, hip. No contact with right leg or knee. No further orders at this time. Bed alarm on  , call light in reach.

## 2023-01-19 NOTE — Plan of Care (Signed)
Problem: Discharge Planning  Goal: Discharge to home or other facility with appropriate resources  Outcome: Progressing     Problem: Pain  Goal: Verbalizes/displays adequate comfort level or baseline comfort level  Outcome: Progressing     Problem: Skin/Tissue Integrity  Goal: Absence of new skin breakdown  Description: 1.  Monitor for areas of redness and/or skin breakdown  2.  Assess vascular access sites hourly  3.  Every 4-6 hours minimum:  Change oxygen saturation probe site  4.  Every 4-6 hours:  If on nasal continuous positive airway pressure, respiratory therapy assess nares and determine need for appliance change or resting period.  Outcome: Progressing     Problem: ABCDS Injury Assessment  Goal: Absence of physical injury  Outcome: Progressing     Problem: Safety - Adult  Goal: Free from fall injury  Outcome: Progressing     Problem: Respiratory - Adult  Goal: Achieves optimal ventilation and oxygenation  Outcome: Progressing     Problem: Cardiovascular - Adult  Goal: Maintains optimal cardiac output and hemodynamic stability  Outcome: Progressing  Goal: Absence of cardiac dysrhythmias or at baseline  Outcome: Progressing     Problem: Musculoskeletal - Adult  Goal: Return mobility to safest level of function  Outcome: Progressing  Goal: Maintain proper alignment of affected body part  Outcome: Progressing  Goal: Return ADL status to a safe level of function  Outcome: Progressing     Problem: Metabolic/Fluid and Electrolytes - Adult  Goal: Electrolytes maintained within normal limits  Outcome: Progressing  Goal: Hemodynamic stability and optimal renal function maintained  Outcome: Progressing  Goal: Glucose maintained within prescribed range  Outcome: Progressing     Problem: Hematologic - Adult  Goal: Maintains hematologic stability  Outcome: Progressing     Problem: Nutrition Deficit:  Goal: Optimize nutritional status  Outcome: Progressing

## 2023-01-19 NOTE — Progress Notes (Signed)
V2.0    [facility] Progress Note      Name:  Jodi Walls DOB/Age/Sex: 1941-03-17  (83 y.o. female)   MRN & CSN:  [id number] & [id number] Encounter Date/Time: 01/19/2023 12:30 PM EDT   Location:  [id number] PCP: [name], MD     Attending:Niclas Markell, MD       Hospital Day: 4    Assessment and Recommendations   Jodi Walls is a 82 y.o. female with pmh of arthritis, remote history of breast cancer, GERD, hypothyroidism, CKD 3 who presents with AKI (acute kidney injury) (HCC)    Patient examined this morning.  Feeling some improvement.  Patient lives at home with grandson.  Uses a walker at the baseline.    Creatinine continues to improve we will continue to follow monitor with series of labs    After patient was positioned on the toilet this morning, she slipped to her left side and slowly fell on the floor.  Patient was witnessed by myself and nursing and PCA.  Patient was assisted back to the toilet.  No injuries at that time patient did not hit her head.  Patient is alert and oriented throughout the entire episode, no loss of consciousness.  Patient denies pain at this time.    Plan:   AKI on CKD 3  IV fluid with sodium bicarb drip  nephrology was consulted  Holding nephrotoxic agents such as numbness Entresto and diuretics until nephrology recs      2.  Weakness  PT OT to evaluate and recommend    3.  Hypotension  Most likely due to dehydration  Will continue to follow monitor vitals for further management    4.  Remote history of PE  Eliquis daily      Diet ADULT DIET; Regular; No Added Salt (3-4 gm)  ADULT ORAL NUTRITION SUPPLEMENT; Lunch, Dinner; Standard High Calorie/High Protein Oral Supplement   DVT Prophylaxis []  Lovenox, []   Heparin, []  SCDs, [x]  Ambulation,  [x]  Eliquis, []  Xarelto  []  Coumadin   Code Status Full Code   Disposition From: Home  Expected Disposition: Home  Estimated Date of Discharge: 1-2 days  Patient requires continued admission due to upon clinical improvement    Surrogate Decision Maker/ POA       Personally reviewed Lab Studies and Imaging         Medical Decision Making:  The following items were considered in medical decision making:  Discussion of patient care with other providers  Reviewed clinical lab tests  Reviewed radiology tests  Reviewed other diagnostic tests/interventions  Independent review of radiologic images  Microbiology cultures and other micro tests reviewed      Subjective:     Chief Complaint: Weakness and abnormal labs was found to have an AKI on CKD    Jodi Walls is a 82 y.o. female who presents with weakness and abnormal labs was found to have an AKI on CKD      Review of Systems:      Pertinent positives and negatives discussed in HPI    Objective:     Intake/Output Summary (Last 24 hours) at 01/19/2023 1050  Last data filed at 01/18/2023 1534  Gross per 24 hour   Intake --   Output 500 ml   Net -500 ml      Vitals:   Vitals:    01/19/23 0437 01/19/23 0603 01/19/23 0722 01/19/23 0913   BP: 104/69  126/71    Pulse: 70  75    Resp: 16  16 18    Temp: 97.6 F (36.4 C)  97.6 F (36.4 C)    TempSrc: Oral  Oral    SpO2: 100%  98%    Weight:  77 kg (169 lb 12.1 oz)     Height:             Physical Exam:      Physical Exam Performed:    BP 126/71   Pulse 75   Temp 97.6 F (36.4 C) (Oral)   Resp 18   Ht 1.524 m (5')   Wt 77 kg (169 lb 12.1 oz)   SpO2 98%   BMI 33.15 kg/m     General appearance: No apparent distress,cooperative.  HEENT: Pupils equal, round, and reactive to light.   Neck: Supple, with full range of motion. Trachea midline.  Respiratory:  Normal respiratory effort. Clear to auscultation,  Cardiovascular: Regular rate and rhythm with normal S1/S2   Abdomen: Soft, non-tender, non-distended   Musculoskeletal: No dema bilaterally.  Full range of motion without deformity.  Skin: Ecchymosis throughout upper extremities  Neurologic:  Neurovascularly intact without any focal sensory/motor deficits.   Psychiatric: Alert and oriented,  thought content appropriate, normal insight  Capillary Refill: Brisk, 3 seconds, normal   Peripheral Pulses: +2 palpable, equal bilaterally       Medications:   Medications:    apixaban  5 mg Oral BID    aspirin  81 mg Oral Daily    vitamin B-12  2,500 mcg Oral Daily    levothyroxine  100 mcg Oral Daily    pantoprazole  40 mg Oral Daily    predniSONE  5 mg Oral Daily    sodium chloride flush  5-40 mL IntraVENous 2 times per day      Infusions:    sodium bicarbonate 75 mEq in sodium chloride 0.45 % 1,000 mL infusion 75 mL/hr at 01/19/23 1003    sodium chloride       PRN Meds: oxyCODONE, 5 mg, Q4H PRN  sodium chloride flush, 5-40 mL, PRN  sodium chloride, , PRN  polyethylene glycol, 17 g, Daily PRN  acetaminophen, 650 mg, Q6H PRN   Or  acetaminophen, 650 mg, Q6H PRN        Labs and Imaging   XR CHEST PORTABLE    Result Date: 01/16/2023  EXAMINATION: ONE XRAY VIEW OF THE CHEST 01/16/2023 10:15 am COMPARISON: 10/13/2020 HISTORY: ORDERING SYSTEM PROVIDED HISTORY: Shortness of Breath TECHNOLOGIST PROVIDED HISTORY: Reason for exam:->Shortness of Breath FINDINGS: The lungs appear clear.  There is a large hiatal hernia.  The heart is unremarkable.  Bony structures appear normal.  Visualized upper abdomen appear normal.  Patient had prior lower cervical fusion.     1. No acute cardiopulmonary process. 2. Large hiatal hernia.       CBC:   Recent Labs     01/17/23  0435 01/18/23  0827 01/19/23  0543   WBC 5.2 5.8 4.9   HGB 9.7* 10.0* 9.1*   PLT 161 169 146     BMP:    Recent Labs     01/17/23  0435 01/18/23  0827 01/18/23  1059 01/19/23  0543   NA 134* 137  --  137   K 4.4 4.2  --  3.5   CL 102 102  --  102   CO2 19* 20*  --  24   BUN 57* 39*  --  27*   CREATININE 3.4* 2.3*  2.2* 1.7*   GLUCOSE 85 87  --  94     Hepatic:   Recent Labs     01/17/23  0435 01/18/23  0827 01/19/23  0543   AST 26 25 20    ALT 10 9* 7*   BILITOT 0.7 0.7 0.5   ALKPHOS 98 94 80     Lipids:   Lab Results   Component Value Date/Time    CHOL 207 05/16/2022  02:43 PM    HDL 66 05/16/2022 02:43 PM    HDL 78 04/06/2010 11:33 AM    TRIG 97 05/16/2022 02:43 PM     Hemoglobin A1C:   Lab Results   Component Value Date/Time    LABA1C 5.4 12/10/2022 02:43 PM     TSH:   Lab Results   Component Value Date/Time    TSH 0.74 01/16/2023 05:46 PM     Troponin: No results found for: "TROPONINT"  Lactic Acid: No results for input(s): "LACTA" in the last 72 hours.  BNP:   Recent Labs     01/16/23  1746   PROBNP 5,356*     UA:  Lab Results   Component Value Date/Time    NITRU Negative 01/16/2023 12:42 PM    COLORU Yellow 01/16/2023 12:42 PM    PHUR 5.5 01/16/2023 12:42 PM    PHUR 6.5 08/23/2011 11:19 AM    WBCUA 4 01/16/2023 12:42 PM    RBCUA 7 01/16/2023 12:42 PM    BACTERIA None Seen 01/16/2023 12:42 PM    CLARITYU Clear 01/16/2023 12:42 PM    LEUKOCYTESUR TRACE 01/16/2023 12:42 PM    UROBILINOGEN 1.0 01/16/2023 12:42 PM    BILIRUBINUR Negative 01/16/2023 12:42 PM    BLOODU Negative 01/16/2023 12:42 PM    GLUCOSEU Negative 01/16/2023 12:42 PM    GLUCOSEU NEGATIVE 03/15/2009 07:09 PM    KETUA TRACE 01/16/2023 12:42 PM     Urine Cultures: No results found for: "LABURIN"  Blood Cultures: No results found for: "BC"  No results found for: "BLOODCULT2"  Organism: No results found for: "ORG"      Electronically signed by Sydnee Cabal, MD on 01/19/2023 at 10:50 AM  Comment: Please note this report has been produced using speech recognition software and may contain errors related to that system including errors in grammar, punctuation, and spelling, as well as words and phrases that may be inappropriate. If there are any questions or concerns, please feel free to contact the dictating provider for clarification.

## 2023-01-20 LAB — COMPREHENSIVE METABOLIC PANEL
ALT: 8 U/L — ABNORMAL LOW (ref 10–40)
AST: 22 U/L (ref 15–37)
Albumin/Globulin Ratio: 1.6 (ref 1.1–2.2)
Albumin: 3.1 g/dL — ABNORMAL LOW (ref 3.4–5.0)
Alkaline Phosphatase: 87 U/L (ref 40–129)
Anion Gap: 11 (ref 3–16)
BUN: 18 mg/dL (ref 7–20)
CO2: 23 mmol/L (ref 21–32)
Calcium: 8.9 mg/dL (ref 8.3–10.6)
Chloride: 105 mmol/L (ref 99–110)
Creatinine: 1.6 mg/dL — ABNORMAL HIGH (ref 0.6–1.2)
Est, Glom Filt Rate: 32 — AB (ref >60)
Glucose: 89 mg/dL (ref 70–99)
Potassium: 3.5 mmol/L (ref 3.5–5.1)
Sodium: 139 mmol/L (ref 136–145)
Total Bilirubin: 0.6 mg/dL (ref 0.0–1.0)
Total Protein: 5.1 g/dL — ABNORMAL LOW (ref 6.4–8.2)

## 2023-01-20 LAB — CBC
Hematocrit: 28.4 % — ABNORMAL LOW (ref 36.0–48.0)
Hemoglobin: 9.7 g/dL — ABNORMAL LOW (ref 12.0–16.0)
MCH: 30.2 pg (ref 26.0–34.0)
MCHC: 34.3 g/dL (ref 31.0–36.0)
MCV: 87.9 fL (ref 80.0–100.0)
MPV: 9.6 fL (ref 5.0–10.5)
Platelets: 165 10*3/uL (ref 135–450)
RBC: 3.23 M/uL — ABNORMAL LOW (ref 4.00–5.20)
RDW: 16.6 % — ABNORMAL HIGH (ref 12.4–15.4)
WBC: 5.8 10*3/uL (ref 4.0–11.0)

## 2023-01-20 MED FILL — PANTOPRAZOLE SODIUM 40 MG PO TBEC: 40 MG | ORAL | Qty: 1

## 2023-01-20 MED FILL — PREDNISONE 5 MG PO TABS: 5 MG | ORAL | Qty: 1

## 2023-01-20 MED FILL — OXYCODONE HCL 5 MG PO TABS: 5 MG | ORAL | Qty: 1

## 2023-01-20 MED FILL — ELIQUIS 5 MG PO TABS: 5 MG | ORAL | Qty: 1

## 2023-01-20 MED FILL — SYNTHROID 100 MCG PO TABS: 100 MCG | ORAL | Qty: 1

## 2023-01-20 MED FILL — ASPIRIN 81 MG PO TBEC: 81 MG | ORAL | Qty: 1

## 2023-01-20 MED FILL — VITAMIN B-12 1000 MCG PO TABS: 1000 MCG | ORAL | Qty: 3

## 2023-01-20 NOTE — Progress Notes (Signed)
New bruising noted on Left hip this morning. Patient complains of feeling "sore and stiff all over". Ambulating without difficulty with front wheel walker .

## 2023-01-20 NOTE — Plan of Care (Signed)
Problem: Discharge Planning  Goal: Discharge to home or other facility with appropriate resources  Outcome: Progressing     Problem: Pain  Goal: Verbalizes/displays adequate comfort level or baseline comfort level  Outcome: Progressing  Flowsheets (Taken 01/19/2023 1943)  Verbalizes/displays adequate comfort level or baseline comfort level:   Encourage patient to monitor pain and request assistance   Assess pain using appropriate pain scale   Administer analgesics based on type and severity of pain and evaluate response   Implement non-pharmacological measures as appropriate and evaluate response     Problem: Skin/Tissue Integrity  Goal: Absence of new skin breakdown  Description: 1.  Monitor for areas of redness and/or skin breakdown  2.  Assess vascular access sites hourly  3.  Every 4-6 hours minimum:  Change oxygen saturation probe site  4.  Every 4-6 hours:  If on nasal continuous positive airway pressure, respiratory therapy assess nares and determine need for appliance change or resting period.  Outcome: Progressing     Problem: ABCDS Injury Assessment  Goal: Absence of physical injury  Outcome: Progressing     Problem: Safety - Adult  Goal: Free from fall injury  Outcome: Progressing     Problem: Respiratory - Adult  Goal: Achieves optimal ventilation and oxygenation  Outcome: Progressing  Flowsheets (Taken 01/19/2023 1943)  Achieves optimal ventilation and oxygenation:   Assess for changes in respiratory status   Assess for changes in mentation and behavior   Position to facilitate oxygenation and minimize respiratory effort   Oxygen supplementation based on oxygen saturation or arterial blood gases     Problem: Cardiovascular - Adult  Goal: Maintains optimal cardiac output and hemodynamic stability  Outcome: Progressing  Goal: Absence of cardiac dysrhythmias or at baseline  Outcome: Progressing     Problem: Musculoskeletal - Adult  Goal: Return mobility to safest level of function  Outcome:  Progressing  Flowsheets (Taken 01/19/2023 1943)  Return Mobility to Safest Level of Function:   Assess patient stability and activity tolerance for standing, transferring and ambulating with or without assistive devices   Assist with transfers and ambulation using safe patient handling equipment as needed  Goal: Maintain proper alignment of affected body part  Outcome: Progressing  Goal: Return ADL status to a safe level of function  Outcome: Progressing  Flowsheets (Taken 01/19/2023 1943)  Return ADL Status to a Safe Level of Function:   Administer medication as ordered   Assess activities of daily living deficits and provide assistive devices as needed   Assist and instruct patient to increase activity and self care as tolerated     Problem: Metabolic/Fluid and Electrolytes - Adult  Goal: Electrolytes maintained within normal limits  Outcome: Progressing  Flowsheets (Taken 01/19/2023 1943)  Electrolytes maintained within normal limits:   Monitor labs and assess patient for signs and symptoms of electrolyte imbalances   Administer electrolyte replacement as ordered   Monitor response to electrolyte replacements, including repeat lab results as appropriate  Goal: Hemodynamic stability and optimal renal function maintained  Outcome: Progressing  Flowsheets (Taken 01/19/2023 1943)  Hemodynamic stability and optimal renal function maintained:   Monitor labs and assess for signs and symptoms of volume excess or deficit   Monitor intake, output and patient weight   Monitor response to interventions for patient's volume status, including labs, urine output, blood pressure (other measures as available)   Monitor urine specific gravity, serum osmolarity and serum sodium as indicated or ordered   Encourage oral intake as appropriate  Goal: Glucose maintained within prescribed range  Outcome: Progressing  Flowsheets (Taken 01/19/2023 1943)  Glucose maintained within prescribed range:   Monitor blood glucose as ordered   Assess for  signs and symptoms of hyperglycemia and hypoglycemia   Assess barriers to adequate nutritional intake and initiate nutrition consult as needed   Administer ordered medications to maintain glucose within target range     Problem: Hematologic - Adult  Goal: Maintains hematologic stability  Outcome: Progressing  Flowsheets (Taken 01/19/2023 1943)  Maintains hematologic stability:   Assess for signs and symptoms of bleeding or hemorrhage   Monitor labs for bleeding or clotting disorders   Administer blood products/factors as ordered     Problem: Nutrition Deficit:  Goal: Optimize nutritional status  Outcome: Progressing

## 2023-01-20 NOTE — Progress Notes (Signed)
Pt has been medicated with Oxycodone 5 mg overnight for pain in back and legs. Turned and repositioned for comfort .

## 2023-01-20 NOTE — Discharge Summary (Signed)
Hospital Medicine Discharge Summary    Patient ID: Jodi Walls      Patient's PCP: [name], MD    Admit Date: 01/16/2023     Discharge Date:   01/20/2023    Admitting Provider: Judeth Horn, MD     Discharge Provider: Sydnee Cabal, MD     Discharge Diagnoses:       Active Hospital Problems    Diagnosis     AKI (acute kidney injury) (HCC) [N17.9]        The patient was seen and examined on day of discharge and this discharge summary is in conjunction with any daily progress note from day of discharge.    Hospital Course:   Jodi Walls is a 82 y.o. female with pmh of arthritis, remote history of breast cancer, GERD, hypothyroidism, CKD 3 who presents with AKI (acute kidney injury.  Nephrology was consulted patient was placed on sodium bicarb IV hydration.  Toxic medications were held.  Patient's symptoms continue to improve patient's creatinine continued to improve and progress.  Patient was very comfortable being discharged home.  Will consult PT OT to evaluate and follow with patient while at home.  Patient is to follow-up PCP in 3 to 5 days or sooner if needed.        Physical Exam Performed:     BP 129/78   Pulse 84   Temp 97.5 F (36.4 C) (Oral)   Resp 16   Ht 1.524 m (5')   Wt 77 kg (169 lb 12.1 oz)   SpO2 98%   BMI 33.15 kg/m       General appearance: No apparent distress,cooperative.  HEENT: Pupils equal, round, and reactive to light.   Neck: Supple, with full range of motion. Trachea midline.  Respiratory:  Normal respiratory effort. Clear to auscultation,  Cardiovascular: Regular rate and rhythm with normal S1/S2   Abdomen: Soft, non-tender, non-distended   Musculoskeletal: No dema bilaterally.  Full range of motion without deformity.  Skin: Ecchymosis throughout upper extremities  Neurologic:  Neurovascularly intact without any focal sensory/motor deficits.   Psychiatric: Alert and oriented, thought content appropriate, normal insight  Capillary Refill: Brisk, 3 seconds,  normal   Peripheral Pulses: +2 palpable, equal bilaterally       Labs: For convenience and continuity at follow-up the following most recent labs are provided:      CBC:    Lab Results   Component Value Date/Time    WBC 5.8 01/20/2023 07:47 AM    HGB 9.7 01/20/2023 07:47 AM    HCT 28.4 01/20/2023 07:47 AM    PLT 165 01/20/2023 07:47 AM       Renal:    Lab Results   Component Value Date/Time    NA 139 01/20/2023 07:47 AM    K 3.5 01/20/2023 07:47 AM    K 4.4 01/17/2023 04:35 AM    CL 105 01/20/2023 07:47 AM    CO2 23 01/20/2023 07:47 AM    BUN 18 01/20/2023 07:47 AM    CREATININE 1.6 01/20/2023 07:47 AM    CALCIUM 8.9 01/20/2023 07:47 AM    PHOS 3.7 06/24/2019 09:33 AM         Significant Diagnostic Studies    Radiology:   XR CHEST PORTABLE   Final Result   1. No acute cardiopulmonary process.   2. Large hiatal hernia.                Consults:  IP CONSULT TO NEPHROLOGY  IP CONSULT TO HOSPITALIST  IP CONSULT TO DIETITIAN  IP CONSULT TO HOME CARE NEEDS    Disposition: Home with PT OT    Condition at Discharge: Stable    Discharge Instructions/Follow-up: PCP in 3 to 5 days or sooner if needed, nephrology regarding CKD    Code Status:  Full Code     Activity: activity as tolerated    Diet: renal diet      Discharge Medications:     Current Discharge Medication List             Details   oxyCODONE (ROXICODONE) 5 MG immediate release tablet Take 1 tablet by mouth every 4 hours as needed for Pain. Max Daily Amount: 30 mg      apixaban (ELIQUIS) 5 MG TABS tablet Take 1 tablet by mouth 2 times daily      pantoprazole (PROTONIX) 40 MG tablet Take 1 tablet by mouth daily      sacubitril-valsartan (ENTRESTO) 24-26 MG per tablet Take 1 tablet by mouth 2 times daily      Cyanocobalamin (B-12) 2500 MCG TABS Take 2,500 mcg by mouth daily      Iron, Ferrous Sulfate, 325 (65 Fe) MG TABS Take 325 mg by mouth daily      levothyroxine (SYNTHROID) 100 MCG tablet TAKE 1 TABLET BY MOUTH DAILY  Qty: 90 tablet, Refills: 1      predniSONE  (DELTASONE) 5 MG tablet TAKE 1 TABLET BY MOUTH DAILY  Qty: 90 tablet, Refills: 1      potassium chloride (KLOR-CON) 10 MEQ extended release tablet Take 1 tablet by mouth 2 times daily      aspirin 81 MG tablet Take 1 tablet by mouth daily      docusate sodium (COLACE) 100 MG capsule Take 1 capsule by mouth daily as needed (Constipation)      ondansetron (ZOFRAN-ODT) 4 MG disintegrating tablet Take 1 tablet by mouth 3 times daily as needed for Nausea or Vomiting  Qty: 21 tablet, Refills: 0      furosemide (LASIX) 40 MG tablet Take 0.5 tablets by mouth daily  Qty: 30 tablet, Refills: 2             Time Spent on discharge: 40 in the examination, evaluation, counseling and review of medications and discharge plan.      Signed:    Sydnee Cabal, MD   01/20/2023      Thank you [name], MD for the opportunity to be involved in this patient's care. If you have any questions or concerns, please feel free to contact me at [phone].    Comment: Please note this report has been produced using speech recognition software and may contain errors related to that system including errors in grammar, punctuation, and spelling, as well as words and phrases that may be inappropriate. If there are any questions or concerns, please feel free to contact the dictating provider for clarification.

## 2023-01-20 NOTE — Progress Notes (Signed)
Discharged home with daughter Jeanice Lim. Pain managed with oxycodone, ambulating with SBA with steady gait, FWW. All belongings gathered and verified .

## 2023-01-20 NOTE — Progress Notes (Signed)
[facility]  Phone: [phone]  Fax: [phone]  [email or web address]         Reason for Consult: AKI on CKD stage III      History Obtained From:  patient, daughter, electronic medical record    History of Present Ilness: 82 year old white female with history of chronic kidney disease stage III and follows with Dr. Rondel Baton. Baseline creatinine ~ 1.5 mg. 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  History of congestive heart failure she was hospitalized at [facility] for right knee arthroplasty.  Postop course was complicated by pulmonary emboli and congestive heart failure. She underwent CT angiogram on 8/29. Also underwent left heart cath on 8/30 which showed no coronary artery disease but EF was down to 32%.  Patient was initiated on Entresto and metoprolol in addition to Lasix.  She was discharged to skilled nursing facility.  She came home from the nursing home on 13 September and since then has not been feeling well. She has been weak and complaining of shortness of breath. Her blood pressure has been running as low as in the 60s systolic  She has had poor oral intake with nausea requiring Zofran  Reports decreased urine output    No report of dysuria or hematuria. She is complaining of back pain  Denies use of nonsteroidal anti-inflammatory drugs  She saw her primary care physician Dr. Modesto Charon yesterday and was noted to be hypotensive. Labs done yesterday revealed a creatinine of 3.3 mg and she was asked to come to the ER.  Here she is found to have creatinine of 4.1 BUN of 66 and bicarb of 18      Review of Systems:   Labs and chart reviewed. Feels well Wants to go home  On RA  Family present       Physical exam:   Constitutional:  VITALS:  BP 129/78   Pulse 84   Temp 97.5 F (36.4 C) (Oral)   Resp 16   Ht 1.524 m (5')   Wt 77 kg (169 lb 12.1 oz)   SpO2 98%   BMI 33.15 kg/m     Gen: alert, awake, nad elderly  Skin: no rash, turgor wnl  Heent:  eomi, mmm  Neck: no bruits  or jvd noted, thyroid normal  Cardiovascular:  S1, S2 without m/r/g  Respiratory: CTA B without w/r/r; respiratory effort normal  Abdomen:  +bs, soft, nt, nd  Ext: No lower extremity edema      Data/  CBC:   Lab Results   Component Value Date/Time    WBC 5.8 01/20/2023 07:47 AM    RBC 3.23 01/20/2023 07:47 AM    RBC 4.72 10/26/2015 11:32 AM    HGB 9.7 01/20/2023 07:47 AM    HCT 28.4 01/20/2023 07:47 AM    MCV 87.9 01/20/2023 07:47 AM    MCH 30.2 01/20/2023 07:47 AM    MCHC 34.3 01/20/2023 07:47 AM    RDW 16.6 01/20/2023 07:47 AM    PLT 165 01/20/2023 07:47 AM    MPV 9.6 01/20/2023 07:47 AM     BMP:    Lab Results   Component Value Date/Time    NA 139 01/20/2023 07:47 AM    K 3.5 01/20/2023 07:47 AM    K 4.4 01/17/2023 04:35 AM    CL 105 01/20/2023 07:47 AM    CO2 23 01/20/2023 07:47 AM    BUN 18 01/20/2023 07:47 AM    CREATININE 1.6 01/20/2023 07:47 AM  CALCIUM 8.9 01/20/2023 07:47 AM    GFRAA 33 01/03/2021 02:51 PM    GFRAA 48 10/11/2011 03:17 PM    LABGLOM 32 01/20/2023 07:47 AM    LABGLOM 45 10/17/2021 12:25 PM    GLUCOSE 89 01/20/2023 07:47 AM    GLUCOSE 109 10/26/2015 11:32 AM         Assessment/Plan   1-AKI on CKD stage III suspect hemodynamic in nature in the setting of hypotension in the setting of ARB use. Patient did undergo CT angiogram and left heart cath at the end of August but her creatinine was at baseline upon discharge Cr improving to 1.6  mg today OK for discharge from renal SP Follow with Dr Rondel Baton    2-CKD stage III follows with Dr. Rondel Baton. Baseline Cr ~ 1.5 mg  3-History of congestive heart failure recent left cath shows no coronary artery disease EF 32%  4 Recent pulmonary emboli on Eliquis  5 Hypotension -- suspect volume depletion and medication effect BP improving Keep holding Entresto, metoprolol and Lasix at this time  6 Metabolic acidosis anion gap improved CO2 23   7 Anemia HGB 9.7 gm fe sat 19%  Fe 43 adequate       Will discuss with nephrology attending physician, Dr. Lodema Hong.  See  attestation for additional recommendations.    Earl Gala, MD

## 2023-01-20 NOTE — Care Coordination-Inpatient (Signed)
Noted d/c order. Contacted [facility] who will pull orders from epic and follow up.  Electronically signed by Watt Climes, LSW on 01/20/2023 at 1:39 PM

## 2023-01-21 NOTE — Care Coordination (Signed)
 Care Transitions Note    Initial Call - Call within 2 business days of discharge: Yes    Attempted to reach patient for transitions of care follow up. Unable to reach patient.    Call placed to [facility]. Left message for Almarie from the intake team. Contact info provided. Requested return call to [facility] - attempting to confirm receipt of patient referral.    Outreach Attempts:   HIPAA compliant voicemail left for patient.     Patient: Jodi Walls    Patient DOB: [birth year]   MRN: [id number]    Reason for Admission: Abnormal lab - sent per PCP  Discharge Date: 01/20/23  RURS: Readmission Risk Score: 16.2    Last Discharge Facility       Date Complaint Diagnosis Description Type Department Provider    01/16/23 Abnormal Lab Acute renal failure superimposed on stage 4 chronic kidney disease, unspecified acute renal failure type (HCC) ... ED to Hosp-Admission (Discharged) (ADMITTED) [facility] Kebab, Mohieddin, MD; Marce Rouse...            Was this an external facility discharge? No    Follow Up Appointment:   Patient does not have a follow up appointment scheduled at time of call. Currently there is no HFU appt scheduled with the PCP.  Future Appointments         Provider Specialty Dept Phone    02/07/2023 1:40 PM [name], MD Family Medicine [phone]    02/13/2023 2:00 PM Buren Rochester, MD Plastic Surgery [phone]            Plan for follow-up on next business day.      Erminio Sang RN BSN  Care Transition Nurse  [phone]

## 2023-01-22 NOTE — Telephone Encounter (Signed)
 Patient daughter calling for mediations to be sent to [employer].

## 2023-01-22 NOTE — Telephone Encounter (Signed)
 Holly advised and states she didn't have time to call Cardiology or Nephrology yet. She will call today after her own appt to schedule.     Left msg for Jeanice Lim stating to return call to schedule Hospital F/U for [facility].

## 2023-01-22 NOTE — Care Coordination-Inpatient (Signed)
 Unless she is seeing nephrology and cardiology soon (less than a week) then try to get her in

## 2023-01-22 NOTE — Care Coordination (Signed)
 Care Transitions Note    Initial Call - Call within 2 business days of discharge: Yes    Attempted to reach patient for transitions of care follow up. Unable to reach patient.    Outreach Attempts:   Multiple attempts to contact patient at phone numbers on file.   HIPAA compliant voicemail left for patient.     Call placed to [facility]. Spoke with [place] from intake. She states she can not see any information on [facility].    Call placed to Alfonso Eans - [facility] Liaison. She states they do have the resumption of care orders for [facility].     Patient: Jodi Walls    Patient DOB: Jun 23, 1940   MRN: [id number]    Reason for Admission: Abnormal lab  Discharge Date: 01/20/23  RURS: Readmission Risk Score: 16.2    Last Discharge Facility       Date Complaint Diagnosis Description Type Department Provider    01/16/23 Abnormal Lab Acute renal failure superimposed on stage 4 chronic kidney disease, unspecified acute renal failure type (HCC) ... ED to Hosp-Admission (Discharged) (ADMITTED) [facility] Kebab, Mohieddin, MD; Marce Rouse...            Was this an external facility discharge? No    Follow Up Appointment:   Patient does not have a follow up appointment scheduled at time of call. Currently there is no HFU appt scheduled with the PCP. Writer will send a message to the office pool to facilitate.  Future Appointments         Provider Specialty Dept Phone    02/07/2023 1:40 PM [name], MD Family Medicine [phone]    02/13/2023 2:00 PM Buren Rochester, MD Plastic Surgery [phone]            No further follow-up call indicated     Erminio Sang RN BSN  Care Transition Nurse  [phone]

## 2023-01-22 NOTE — Telephone Encounter (Signed)
 Appt scheduled for hospital f/u on 01-24-2023.

## 2023-01-24 ENCOUNTER — Ambulatory Visit: Admit: 2023-01-24 | Discharge: 2023-01-24 | Payer: MEDICARE | Attending: Family Medicine | Primary: Family Medicine

## 2023-01-24 VITALS — BP 116/64 | Temp 98.10000°F | Ht 60.0 in | Wt 168.4 lb

## 2023-01-24 DIAGNOSIS — N189 Chronic kidney disease, unspecified: Secondary | ICD-10-CM

## 2023-01-24 DIAGNOSIS — N179 Acute kidney failure, unspecified: Secondary | ICD-10-CM

## 2023-01-24 MED ORDER — PANTOPRAZOLE SODIUM 40 MG PO TBEC
40 MG | ORAL_TABLET | Freq: Every day | ORAL | 1 refills | Status: DC
Start: 2023-01-24 — End: 2023-08-07

## 2023-01-24 MED ORDER — FUROSEMIDE 40 MG PO TABS
40 | ORAL_TABLET | Freq: Every day | ORAL | 1 refills | Status: AC
Start: 2023-01-24 — End: ?

## 2023-01-24 MED ORDER — SACUBITRIL-VALSARTAN 24-26 MG PO TABS
24-26 | ORAL_TABLET | Freq: Two times a day (BID) | ORAL | 0 refills | Status: DC
Start: 2023-01-24 — End: 2023-10-31

## 2023-01-24 MED ORDER — PREDNISONE 5 MG PO TABS
5 | ORAL_TABLET | ORAL | 1 refills | Status: AC
Start: 2023-01-24 — End: ?

## 2023-01-24 NOTE — Progress Notes (Signed)
 Subjective:      Patient ID: Jodi Walls is a 82 y.o. female.    Chief Complaint   Patient presents with    Follow-Up from [facility] 01-16-2023 renal failure        Patient presents with:  Follow-Up from Hospital: [facility] 01-16-2023 renal failure    Here for the above    Here with daughter Jodi Walls     She is much better    Date of Birth:  12-Jun-1940    Date of Visit:  01/24/2023     -- Pcn [Penicillins] -- Swelling    --  Face and hands swelled   -- Triamterene      --  Severe stomach pain,SOB,severe cramps throughout             her body   -- Ace Inhibitors -- Other (See Comments)    --  cough   -- Atenolol  -- Other (See Comments)    --  Bradycardia (HR 40's)   -- Atorvastatin -- Other (See Comments)    --  Muscle aches   -- Zithromax [Azithromycin] -- Swelling   -- Codeine -- Nausea And Vomiting    Current Outpatient Medications:  oxyCODONE  (ROXICODONE ) 5 MG immediate release tablet, Take 1 tablet by mouth every 4 hours as needed for Pain., Disp: , Rfl:   apixaban  (ELIQUIS ) 5 MG TABS tablet, Take 1 tablet by mouth 2 times daily, Disp: , Rfl:   pantoprazole  (PROTONIX ) 40 MG tablet, Take 1 tablet by mouth daily, Disp: , Rfl:   sacubitril -valsartan  (ENTRESTO ) 24-26 MG per tablet, Take 1 tablet by mouth 2 times daily, Disp: , Rfl:   Cyanocobalamin  (B-12) 2500 MCG TABS, Take 2,500 mcg by mouth daily, Disp: , Rfl:   Iron, Ferrous Sulfate , 325 (65 Fe) MG TABS, Take 325 mg by mouth daily, Disp: , Rfl:   docusate sodium  (COLACE) 100 MG capsule, Take 1 capsule by mouth daily as needed (Constipation), Disp: , Rfl:   ondansetron  (ZOFRAN -ODT) 4 MG disintegrating tablet, Take 1 tablet by mouth 3 times daily as needed for Nausea or Vomiting, Disp: 21 tablet, Rfl: 0  furosemide  (LASIX ) 40 MG tablet, Take 0.5 tablets by mouth daily, Disp: 30 tablet, Rfl: 2  levothyroxine  (SYNTHROID ) 100 MCG tablet, TAKE 1 TABLET BY MOUTH DAILY, Disp: 90 tablet, Rfl: 1  predniSONE  (DELTASONE ) 5 MG tablet, TAKE 1 TABLET BY MOUTH  DAILY (Patient taking differently: Take 1 tablet by mouth every other day), Disp: 90 tablet, Rfl: 1  potassium chloride  (KLOR-CON ) 10 MEQ extended release tablet, Take 1 tablet by mouth 2 times daily, Disp: , Rfl:   aspirin  81 MG tablet, Take 1 tablet by mouth daily, Disp: , Rfl:     No current facility-administered medications for this visit.      ---------------------------------                  01/24/23                            1545            ---------------------------------   BP:             116/64            Site:       Left Upper Arm        Position:        Sitting  Cuff Size:     Medium Adult         Temp:      98.1 F (36.7 C)      TempSrc:       Temporal           Weight: 76.4 kg (168 lb 6.4 oz)   Height:      1.524 m (5')        ---------------------------------  Body mass index is 32.89 kg/m.     Wt Readings from Last 3 Encounters:  01/24/23 : 76.4 kg (168 lb 6.4 oz)  01/19/23 : 77 kg (169 lb 12.1 oz)  01/15/23 : 73.4 kg (161 lb 12.8 oz)    BP Readings from Last 3 Encounters:  01/24/23 : 116/64  01/20/23 : 129/78  01/15/23 : 98/70            Review of Systems   Constitutional:  Negative for chills and fever.   Respiratory:          She gets DOE    Cardiovascular:  Negative for chest pain.   Gastrointestinal:  Negative for abdominal pain and blood in stool.   Genitourinary:  Negative for difficulty urinating, dysuria and hematuria.   Neurological:  Negative for headaches.       Objective:   Physical Exam  Constitutional:       General: She is not in acute distress.     Appearance: She is well-developed. She is not ill-appearing or diaphoretic.   Cardiovascular:      Rate and Rhythm: Normal rate and regular rhythm.      Heart sounds: Normal heart sounds. No murmur heard.     No friction rub. No gallop.   Pulmonary:      Effort: Pulmonary effort is normal. No tachypnea, accessory muscle usage or respiratory distress.      Breath sounds: Normal breath  sounds. No decreased breath sounds, wheezing, rhonchi or rales.   Lymphadenopathy:      Cervical: No cervical adenopathy.      Upper Body:      Right upper body: No supraclavicular adenopathy.      Left upper body: No supraclavicular adenopathy.   Skin:     General: Skin is warm and dry.      Coloration: Skin is not pale.   Neurological:      Mental Status: She is alert.         Assessment:   Assessment     Diagnosis Orders   1. Acute renal failure superimposed on chronic kidney disease, unspecified acute renal failure type, unspecified CKD stage (HCC)  Basic Metabolic Panel    CBC with Auto Differential      2. Chronic systolic (congestive) heart failure  Basic Metabolic Panel    CBC with Auto Differential      3. 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

## 2023-01-24 NOTE — Patient Instructions (Signed)
 Do call Dr Rondel Baton for  a check up  Reach out to dr leuenberger for a check up  Check blood in about 10-14 days   See me back in 3 months

## 2023-01-25 NOTE — HIE Documentation (Unsigned)
 Subjective  Jodi Walls  Dec 23, 1940  Chief Complaint  Patient presents with   Hospital Follow-up    Was in [facility] for AKI    Jodi Walls returns to the [facility] for  hospital follow-up.  Prior office notes, medical records and diagnostic   studies  were reviewed in conjunction with the encounter.    She recently presented to the office requiring preoperative cardiac risk  assessment prior to total knee arthroplasty.  A repeat limited echocardiogram  showed no significant change in left ventricular systolic function.  Following  her surgery she developed acute onset of chest discomfort.  Her ECG showed   sinus  rhythm with a persistent left bundle branch block pattern.  (Internal study  independently interpreted by me.)  Her high sensitivity troponin though was  significantly elevated.  A transthoracic echocardiogram showed a decline in   left  ventricular systolic function with an ejection fraction calculated 32% with a  new anterior wall motion abnormality.  (Interpretation reviewed by me.)   She  was then referred for diagnostic coronary angiography that showed normal  epicardial coronary anatomy and no evidence of atherosclerotic coronary artery  disease.  (Interpretation reviewed by me.)  A CT pulmonary angiogram showed  evidence of a small subsegmental pulmonary embolism.  (Interpretation reviewed  by me.)  She was discharged from the hospital with a prescription for   metoprolol  succinate 25 mg daily, Entresto  24/26 mg twice daily and furosemide  40 mg   daily.    After discharge to a skilled nursing facility, she developed symptomatic  hypotension was taken to a hospital out of network.  She was found to be in  acute on chronic renal failure.  She was admitted to the hospital and seen in  consultation by Nephrology.  Most of her cardiovascular medications initiated   at  discharge from [facility] were discontinued and she was shaved   intravenous  crystalloid.   Her creatinine fall from 3.4-1.6 prior to discharge.    She was brought to clinic today in a wheelchair.  She was accompanied by her  daughter who helped provide collateral history.    Patient's Medications  Current Medications   APIXABAN  (ELIQUIS ) 5 MG TABLET    Take 1 Tablet (5 mg) by mouth 2 times   daily.      Order Dose: 5 mg   CHOLECALCIFEROL (VITAMIN D-3) 10 MCG (400 UNIT) TABLET    Take 400 Units by  mouth daily.      Order Dose: 400 Units   CYANOCOBALAMIN  100 MCG TABLET    Take 100 mcg by mouth daily.      Order Dose: 100 mcg   FERROUS SULFATE  PO    Take by mouth every Monday, Wednesday, Friday.      Order Dose: --   FUROSEMIDE  (LASIX ) 40 MG TABLET    Take 40 mg by mouth daily.      Order Dose: 40 mg   LEVOTHYROXINE  (SYNTHROID ) 75 MCG TABLET    100 mcg. daily      Order Dose: 100 mcg   MELATONIN 3 MG TABLET    Take 3 mg by mouth nightly at bedtime.      Order Dose: 3 mg   METOPROLOL SUCCINATE (TOPROL) 25 MG XL TABLET    Take 0.5 Tablets (12.5 mg)   by  mouth daily.      Order Dose: 12.5 mg   PANTOPRAZOLE  (PROTONIX ) 40 MG TABLET,  DELAYED RELEASE (E.C.)    Take 1 Tablet  (40 mg) by mouth daily.      Order Dose: 40 mg   POTASSIUM CHLORIDE  (KDUR) 10 MEQ PO TABLET    Take 10 mEq by mouth 2 times  daily. daily      Order Dose: 10 mEq   PREDNISONE  (DELTASONE ) 5 MG TABLET    Take 5 mg by mouth every morning.      Order Dose: 5 mg   ROSUVASTATIN (CRESTOR) 10 MG TABLET    Take 1 Tablet by mouth daily.      Order Dose: 10 mg   SACUBITRIL -VALSARTAN  (ENTRESTO ) 24-26 MG TABLET    Take 1 Tablet by mouth 2  times daily.      Order Dose: 1 Tablet   SENNA (SENOKOT) 8.6 MG    Take 2 Tablets by mouth daily.      Order Dose: 2 Tablets                Review of Systems      Objective  There were no vitals filed for this visit.  There is no height or weight on file to calculate BMI.    Physical Exam  Constitutional:     Appearance: Normal appearance.  Neck:     Vascular: No carotid bruit or JVD.  Cardiovascular:     Rate and  Rhythm: Normal rate and regular rhythm. No extrasystoles are  present.     Pulses: Normal pulses.     Heart sounds: Normal heart sounds, S1 normal and S2 normal. No murmur   heard.     No gallop.  Pulmonary:     Effort: Pulmonary effort is normal.     Breath sounds: Normal breath sounds. No wheezing or rales.  Abdominal:     General: Bowel sounds are normal.     Tenderness: There is no abdominal tenderness.  Musculoskeletal:     General: No edema.  Skin:     General: Skin is warm and dry.     Nails: There is no clubbing.  Neurological:     Mental Status: She is alert and oriented to person, place, and time.            Assessment    IMPRESSION:        ICD-10-CM  1. Acute systolic heart failure (CMS HCC)  I50.21          PLAN AND RECOMMENDATION:      1.  Left bundle branch block    This has been a stable finding on her surface ECG.  She is otherwise  asymptomatic.  Previous transthoracic echocardiogram showed a structurally  normal heart and preserved left ventricular systolic function.  SPECT MPI  October 2018 showed no reversible perfusion defect concerning for ischemia.    Transthoracic echocardiogram 2021 calculated ejection fraction 46%.    2. 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    3. Hyperlipidemia    Rosuvastatin was discontinued during a recent hospitalization where she  developed acute on chronic renal failure.    4. Acute systolic heart failure    He was speculated that she may have had acute apical ballooning syndrome.    I recommended a limited echocardiogram for reassessment of left ventricular  systolic function.    5. History venous thromboembolic disease    She developed an acute submassive pulmonary embolism following hospitalization  for orthopedic surgery.  I presume six-months of anticoagulation should be sufficient.    6. Chronic kidney disease (stage IIIa)    She was followed by Dr. Annia.    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      Return to clinic in three months.    --- Please note that I used  voice recognition software to generate the above  note.

## 2023-01-28 ENCOUNTER — Telehealth: Payer: Medicare (Managed Care)

## 2023-01-28 NOTE — Telephone Encounter (Signed)
 01/28/23 HHC Forms are completed!

## 2023-01-29 NOTE — Telephone Encounter (Signed)
 Victorino Dike w/ [facility] callong for Verbal on Nursing and Resumption of care    [phone]-[phone]

## 2023-01-29 NOTE — Telephone Encounter (Signed)
 Jayliana advised and verbalized understanding.

## 2023-01-29 NOTE — Telephone Encounter (Signed)
 Patient's daughter called in and wanted to know how low of a BP is too low before the patient needs to be taken to ED.     Patient's BP was 98/62 and pulse 51 yesterday, 01/28/23.

## 2023-01-29 NOTE — Telephone Encounter (Signed)
 Delon with [facility] called to report readings on patient blood pressure from this morning.     When she arrived today her bp was 85/71.     Standing it was 73/54  -a little dizzy     Later it was 100/67 standing    Sitting 94/65    Little later could not get a reading, changed machines and it was 83/62    When on walker 81/61    Patient feels ok.    Saw kidney doctor yesterday, patient unsure of kidney doctor name-she knows he is not on Epic.     Delon is PT and going to ask for a nurse to follow patient blood pressure.  .    Please advise, [phone]

## 2023-01-29 NOTE — Telephone Encounter (Signed)
 ok

## 2023-01-29 NOTE — Telephone Encounter (Signed)
 Holly advised and verbalized understanding.

## 2023-01-29 NOTE — Telephone Encounter (Signed)
 If she is feeling well those numbers are fine  If staying below 90 that is too low and she should be seen to evaluate

## 2023-02-04 NOTE — Other (Unsigned)
Assessment  [facility]  Discharge Summary    Referral Source: Referral Source: Lyda Perone, MD  1st date of treatment:  Date of Eval: 12/10/22  Last date of treatment: 12/10/22  Total # of Visits: 1      Patient Name: Jodi Walls  Date of Birth: 06/30/1940  Diagnosis: Diagnosis: Rt Knee OA  Functional Diagnosis: Functional Diagnosis: Pain and weakness    Current Functional and Physical Status:  Objective measures not available    Outcome Measure: KOS (ability) Raw Score: 0, Percentage: 0 %    Reason for Discharge:  Patient / client was unable to continue to work toward anticipated goals due   to  medical or psychosocial complications.    Pt having kidney and heart issues.    Thank you for your referral to [facility].  Jane Canary. Sorrells, PT,MBA,OCS  02/04/2023  Clinician  Date    [facility] [place]  [phone]  [place] [address]  Phone:  [phone]  Fax:  [phone]

## 2023-02-05 NOTE — Other (Unsigned)
Stable right total knee replacement in good alignment

## 2023-02-05 NOTE — Other (Unsigned)
Alphonsus Sias, MD

## 2023-02-05 NOTE — Other (Unsigned)
Subjective  Subjective:  Patient ID: Jodi Walls  Age: 82 y.o. (DOB: 1940/08/16)  Ethnicity: Non-Hispanic  Race: White/Caucasian  Gender: female    Chief Complaint:  Chief Complaint  Patient presents with   Total Knee Replacement    R TKA 12/24/22          HPI:  6 weeks from a right total knee replacement.  Admitted to the hospital for 3  weeks for acute renal failure and a pulmonary embolism.  Currently on Eliquis.  She then went to rehab for 1 week and is at home now with home health.  No  complaints with her knee today.  Does not have much endurance right now.    Ortho Exam:  Came in a wheelchair today due to the distance walking in.  She is using a  walker at home.  Right knee incision is well healed with mild swelling.  Range  of motion is 0-115 degrees.    Review of Systems:      Objective          Patient Vitals:  Vitals  Height: 5' (152.4 cm)  Weight: 168 lb (76.2 kg)  Pain Score:   4        X-Ray/Injection In Clinic Procedure Notes    X-Ray/Inj Notes    Author Status Last Editor Updated Created   Hissong, Earlie Server., NP Signed Hissong, Earlie Server., NP 02/05/2023 12:32 PM 02/05/2023  12:26 PM     Assoc. Orders   DIAG-KNEE 3-VIEWS RT          Stable right total knee replacement in good alignment                In Clinic Administered Meds Billing Data  None        Assessment  Assessment/Impression:    Encounter Diagnoses  Code Name Primary?   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  She is progressing well but had complications  postop.    Plan:    Orders Placed This Encounter   Xray Clinic - Knee 3 views AP, lateral, sunrise Right  ([id number])    Order Specific Question:   Release to patient    Answer:   Immediate    Will finish physical therapy and increase walking and activities as tolerated.  Call when she is ready for outpatient physical therapy.  Follow up as needed.

## 2023-02-07 ENCOUNTER — Encounter: Payer: MEDICARE | Attending: Family Medicine | Primary: Family Medicine

## 2023-02-13 ENCOUNTER — Encounter: Payer: MEDICARE | Attending: Surgery | Primary: Family Medicine

## 2023-02-25 NOTE — Telephone Encounter (Signed)
SW patients daughter Jodi Walls patient is experiencing nervousness and stress (broken heart syndrome). Patient is scheduled for 03-22-2023.

## 2023-03-22 ENCOUNTER — Encounter: Payer: MEDICARE | Attending: Family Medicine | Primary: Family Medicine

## 2023-03-22 ENCOUNTER — Encounter: Admit: 2023-03-22 | Admitting: Family Medicine

## 2023-03-22 DIAGNOSIS — N179 Acute kidney failure, unspecified: Secondary | ICD-10-CM

## 2023-03-22 DIAGNOSIS — N189 Chronic kidney disease, unspecified: Principal | ICD-10-CM

## 2023-03-23 LAB — BASIC METABOLIC PANEL
Anion Gap: 9 (ref 3–16)
BUN: 28 mg/dL — ABNORMAL HIGH (ref 7–20)
CO2: 26 mmol/L (ref 21–32)
Calcium: 9.4 mg/dL (ref 8.3–10.6)
Chloride: 104 mmol/L (ref 99–110)
Creatinine: 1.5 mg/dL — ABNORMAL HIGH (ref 0.6–1.2)
Est, Glom Filt Rate: 34 — AB (ref >60)
Glucose: 100 mg/dL — ABNORMAL HIGH (ref 70–99)
Potassium: 4.4 mmol/L (ref 3.5–5.1)
Sodium: 139 mmol/L (ref 136–145)

## 2023-03-23 LAB — CBC WITH AUTO DIFFERENTIAL
Basophils %: 0.5 %
Basophils Absolute: 0 10*3/uL (ref 0.0–0.2)
Eosinophils %: 4.7 %
Eosinophils Absolute: 0.2 10*3/uL (ref 0.0–0.6)
Hematocrit: 35.3 % — ABNORMAL LOW (ref 36.0–48.0)
Hemoglobin: 11.6 g/dL — ABNORMAL LOW (ref 12.0–16.0)
Lymphocytes %: 20.1 %
Lymphocytes Absolute: 0.9 10*3/uL — ABNORMAL LOW (ref 1.0–5.1)
MCH: 29.9 pg (ref 26.0–34.0)
MCHC: 32.7 g/dL (ref 31.0–36.0)
MCV: 91.3 fL (ref 80.0–100.0)
MPV: 9.6 fL (ref 5.0–10.5)
Monocytes %: 9.5 %
Monocytes Absolute: 0.4 10*3/uL (ref 0.0–1.3)
Neutrophils %: 65.2 %
Neutrophils Absolute: 2.8 10*3/uL (ref 1.7–7.7)
Platelets: 215 10*3/uL (ref 135–450)
RBC: 3.87 M/uL — ABNORMAL LOW (ref 4.00–5.20)
RDW: 14.7 % (ref 12.4–15.4)
WBC: 4.2 10*3/uL (ref 4.0–11.0)

## 2023-04-01 MED ORDER — ONDANSETRON 4 MG PO TBDP
4 | ORAL_TABLET | ORAL | 0 refills | Status: DC
Start: 2023-04-01 — End: 2024-05-15

## 2023-04-01 NOTE — Other (Unsigned)
 Subjective  Jodi Walls  04/25/1941  Chief Complaint  Patient presents with   3 month follow up      Jodi Walls returns to the [facility] for a   three  month checkup.  Prior office notes, medical records and diagnostic

## 2023-04-02 NOTE — Telephone Encounter (Signed)
 Because of the lung clot in August she needs to be on the med for at least 6 months   That would take us  through February   Where does she want it sent?      Deep vein thrombosis and/or pulmonary embolism treatment: No dosage adjustment is recommended by the manufacturer for any degree of reduced kidney function.

## 2023-04-02 NOTE — Telephone Encounter (Signed)
 [phone] (home) - Left msg for Jeanice Lim (daughter, on HIPAA) or Kriste Basque to return call.

## 2023-04-02 NOTE — Telephone Encounter (Signed)
 Patient called the office today asking for Dr. Modesto Charon to advise on a medication. Patient saw her cardiologist yesterday and was taken off of her Entresto. Patient took her last dose of 5 mg Eliquis yesterday and asks if she should continue this medication or

## 2023-04-03 MED ORDER — APIXABAN 5 MG PO TABS
5 MG | ORAL_TABLET | Freq: Two times a day (BID) | ORAL | 3 refills | Status: DC
Start: 2023-04-03 — End: 2023-08-15

## 2023-04-03 NOTE — Telephone Encounter (Signed)
 Holly advised and verbalized understanding. Please send to [facility].

## 2023-04-03 NOTE — Telephone Encounter (Signed)
 Holly advised and verbalized understanding.

## 2023-04-03 NOTE — Addendum Note (Signed)
 Addended by: [name] on: 04/03/2023 10:02 AM     Modules accepted: Orders

## 2023-04-03 NOTE — Telephone Encounter (Signed)
 Med sent  Unless cardiology wanted her on the aspirin  she should stop it  Also keep appt to see me   Later this month or first of January     Orders Placed This Encounter   Medications    apixaban  (ELIQUIS ) 5 MG TABS tablet     Sig: Take 1 tablet by mouth 2 times daily     Dispense:  60 tablet     Refill:  3

## 2023-04-04 NOTE — Telephone Encounter (Signed)
 We can use an different pharmacy  [facility] or [facility] /  Aspirin ok till then

## 2023-04-04 NOTE — Telephone Encounter (Signed)
 Holly advised and verbalized understanding. [facility] called her earlier today and said Eliquis was ready. She already picked up medication.

## 2023-04-04 NOTE — Telephone Encounter (Signed)
 I tried calling the office and it rang forever so I figured the ladies are getting slammed. I wanted to tell you that the Eliquis is out of stock at [facility]. They have to order it. My sister was supposed to handle this over a week ago so now she is out.

## 2023-04-10 MED ORDER — LEVOTHYROXINE SODIUM 100 MCG PO TABS
100 MCG | ORAL_TABLET | Freq: Every day | ORAL | 1 refills | Status: DC
Start: 2023-04-10 — End: 2023-10-15

## 2023-04-26 ENCOUNTER — Encounter: Payer: MEDICARE | Attending: Family Medicine | Primary: Family Medicine

## 2023-05-06 ENCOUNTER — Encounter: Payer: MEDICARE | Attending: Family Medicine | Primary: Family Medicine

## 2023-05-10 ENCOUNTER — Encounter: Payer: MEDICARE | Attending: Family Medicine | Primary: Family Medicine

## 2023-05-17 ENCOUNTER — Ambulatory Visit: Admit: 2023-05-17 | Discharge: 2023-05-17 | Payer: MEDICARE | Attending: Family Medicine | Primary: Family Medicine

## 2023-05-17 VITALS — BP 122/80 | Temp 97.20000°F | Ht 60.0 in | Wt 165.6 lb

## 2023-05-17 DIAGNOSIS — I10 Essential (primary) hypertension: Secondary | ICD-10-CM

## 2023-05-17 NOTE — Progress Notes (Signed)
Subjective:      Patient ID: Jodi Walls is a 83 y.o. female.    Chief Complaint   Patient presents with    Check-Up     Hypertension, lipids        Patient presents with:  Check-Up: Hypertension, lipids    She is here for the above   She is better  She can do some house work now  She is having some doe and intermittent cp at times for many months   Since before the surgery  She tells me cardiology was not concerned   W/u has been ok   Here with her daughter    Sherron Monday to daughter and indeed she is much better   [facility] using a cane now     Date of Birth:  [birth year]    Date of Visit:  05/17/2023     -- Pcn [Penicillins] -- Swelling    --  Face and hands swelled   -- Triamterene     --  Severe stomach pain,SOB,severe cramps throughout             her body   -- Ace Inhibitors -- Other (See Comments)    --  cough   -- Atenolol -- Other (See Comments)    --  Bradycardia (HR 40's)   -- Atorvastatin -- Other (See Comments)    --  Muscle aches   -- Zithromax [Azithromycin] -- Swelling   -- Codeine -- Nausea And Vomiting    Current Outpatient Medications:  cyclobenzaprine (FEXMID) 7.5 MG tablet, Take 1 tablet by mouth daily, Disp: , Rfl:   senna (SENOKOT) 8.6 MG tablet, Take 2 tablets by mouth daily, Disp: , Rfl:   Magnesium 250 MG CAPS, Take by mouth, Disp: , Rfl:   levothyroxine (SYNTHROID) 100 MCG tablet, TAKE 1 TABLET BY MOUTH DAILY, Disp: 90 tablet, Rfl: 1  apixaban (ELIQUIS) 5 MG TABS tablet, Take 1 tablet by mouth 2 times daily, Disp: 60 tablet, Rfl: 3  pantoprazole (PROTONIX) 40 MG tablet, Take 1 tablet by mouth daily, Disp: 90 tablet, Rfl: 1  predniSONE (DELTASONE) 5 MG tablet, Take 1 tablet by mouth every other day, Disp: 45 tablet, Rfl: 1  furosemide (LASIX) 40 MG tablet, Take 0.5 tablets by mouth daily, Disp: 45 tablet, Rfl: 1  Cyanocobalamin (B-12) 2500 MCG TABS, Take 2,500 mcg by mouth daily, Disp: , Rfl:   docusate sodium (COLACE) 100 MG capsule, Take 1 capsule by mouth daily as needed (Constipation), Disp: ,  Rfl:   potassium chloride (KLOR-CON) 10 MEQ extended release tablet, Take 1 tablet by mouth 2 times daily, Disp: , Rfl:   ondansetron (ZOFRAN-ODT) 4 MG disintegrating tablet, PLACE 1 TABLET ON THE TONGUE AND LET DISSOLVE AND THEN SWALLOW THREE TIMES DAILY AS NEEDED FOR NAUSEA OR VOMITING (Patient not taking: Reported on 05/17/2023), Disp: 21 tablet, Rfl: 0  sacubitril-valsartan (ENTRESTO) 24-26 MG per tablet, Take 1 tablet by mouth 2 times daily (Patient not taking: Reported on 05/17/2023), Disp: 180 tablet, Rfl: 0  oxyCODONE (ROXICODONE) 5 MG immediate release tablet, Take 1 tablet by mouth every 4 hours as needed for Pain. (Patient not taking: Reported on 05/17/2023), Disp: , Rfl:   Iron, Ferrous Sulfate, 325 (65 Fe) MG TABS, Take 325 mg by mouth daily (Patient not taking: Reported on 05/17/2023), Disp: , Rfl:   aspirin 81 MG tablet, Take 1 tablet by mouth daily (Patient not taking: Reported on 05/17/2023), Disp: , Rfl:     No current  facility-administered medications for this visit.      ---------------------------------                  05/17/23                            1532            ---------------------------------   BP:             122/80            Site:       Left Upper Arm        Position:        Sitting           Cuff Size:     Medium Adult         Temp:      97.2 F (36.2 C)      TempSrc:       Temporal           Weight: 75.1 kg (165 lb 9.6 oz)   Height:      1.524 m (5')        ---------------------------------  Body mass index is 32.34 kg/m.     Wt Readings from Last 3 Encounters:  05/17/23 : 75.1 kg (165 lb 9.6 oz)  01/24/23 : 76.4 kg (168 lb 6.4 oz)  01/19/23 : 77 kg (169 lb 12.1 oz)    BP Readings from Last 3 Encounters:  05/17/23 : 122/80  01/24/23 : 116/64  01/20/23 : 129/78            Review of Systems   Constitutional:  Negative for chills and fever.   Cardiovascular:  Negative for chest pain.        No exertional cp    Gastrointestinal:  Negative for  abdominal pain, blood in stool, constipation and diarrhea.   Genitourinary:  Negative for difficulty urinating, dysuria and hematuria.   Skin:         Skin changes on the arms rough patches for a year        Objective:   Physical Exam  Constitutional:       General: She is not in acute distress.     Appearance: Normal appearance. She is well-developed. She is not ill-appearing or diaphoretic.   Cardiovascular:      Rate and Rhythm: Normal rate and regular rhythm.      Heart sounds: Normal heart sounds. No murmur heard.     No friction rub. No gallop.   Pulmonary:      Effort: Pulmonary effort is normal. No tachypnea, accessory muscle usage or respiratory distress.      Breath sounds: Normal breath sounds. No decreased breath sounds, wheezing, rhonchi or rales.   Lymphadenopathy:      Cervical: No cervical adenopathy.      Upper Body:      Right upper body: No supraclavicular adenopathy.      Left upper body: No supraclavicular adenopathy.   Skin:     General: Skin is warm and dry.      Coloration: Skin is not pale.   Neurological:      Mental Status: She is alert.         Assessment:   Assessment     Diagnosis Orders   1. Essential hypertension, benign        2. Chronic systolic (congestive) heart failure  3. Stage 3a chronic kidney disease (HCC)        4. Seronegative rheumatoid arthritis (HCC)          Provoked PE in August last year  Should be ok to come off the med     Interval Hx:  Dr Katrinka Blazing cardiology and reviewed   Dr Rondel Baton for renal disease on 03/25/23  Reviewed the hematology note form 02/13/23 and mgus and PE and treat for 6 months and also anemia for which considering iv iron   She did get 2 iron infusions     The rheumatologist has her on prednisone still   They tell me cardiology not concerned and she is well   Reviewed nml cath no coronary disease. Cath done on 12/28/22 at [facility]       PLAN:   Plan    Continue the medicines  Stop the eliquis after february   See 3 months         [name], MD

## 2023-05-17 NOTE — Patient Instructions (Signed)
Continue the medicines  Stop the eliquis after february   See 3 months

## 2023-08-07 MED ORDER — PANTOPRAZOLE SODIUM 40 MG PO TBEC
40 | ORAL_TABLET | Freq: Every day | ORAL | 1 refills | Status: DC
Start: 2023-08-07 — End: 2024-02-20

## 2023-08-15 ENCOUNTER — Ambulatory Visit: Admit: 2023-08-15 | Discharge: 2023-08-15 | Payer: Medicare (Managed Care) | Attending: Family Medicine

## 2023-08-15 VITALS — BP 122/78 | HR 80 | Temp 97.30000°F | Ht 60.0 in | Wt 174.8 lb

## 2023-08-15 DIAGNOSIS — I10 Essential (primary) hypertension: Secondary | ICD-10-CM

## 2023-08-15 NOTE — Progress Notes (Signed)
 Subjective:      Patient ID: Jodi Walls is a 83 y.o. female.    Chief Complaint   Patient presents with    Check-Up      Hypertension, thyroid, lipids        Patient presents with:  Check-Up:  Hypertension, thyroid, lipids    Here for the above   Feeling ok   Getting around with her cane       Meds same     She is off the eliquis     Date of Birth:  06-13-1940    Date of Visit:  08/15/2023     -- Pcn [Penicillins] -- Swelling    --  Face and hands swelled   -- Triamterene      --  Severe stomach pain,SOB,severe cramps throughout             her body   -- Ace Inhibitors -- Other (See Comments)    --  cough   -- Atenolol  -- Other (See Comments)    --  Bradycardia (HR 40's)   -- Atorvastatin -- Other (See Comments)    --  Muscle aches   -- Zithromax [Azithromycin] -- Swelling   -- Codeine -- Nausea And Vomiting    Current Outpatient Medications:  pantoprazole  (PROTONIX ) 40 MG tablet, TAKE 1 TABLET BY MOUTH DAILY, Disp: 90 tablet, Rfl: 1  Magnesium 250 MG CAPS, Take by mouth, Disp: , Rfl:   levothyroxine  (SYNTHROID ) 100 MCG tablet, TAKE 1 TABLET BY MOUTH DAILY, Disp: 90 tablet, Rfl: 1  ondansetron  (ZOFRAN -ODT) 4 MG disintegrating tablet, PLACE 1 TABLET ON THE TONGUE AND LET DISSOLVE AND THEN SWALLOW THREE TIMES DAILY AS NEEDED FOR NAUSEA OR VOMITING, Disp: 21 tablet, Rfl: 0  predniSONE  (DELTASONE ) 5 MG tablet, Take 1 tablet by mouth every other day, Disp: 45 tablet, Rfl: 1  sacubitril -valsartan  (ENTRESTO ) 24-26 MG per tablet, Take 1 tablet by mouth 2 times daily, Disp: 180 tablet, Rfl: 0  furosemide  (LASIX ) 40 MG tablet, Take 0.5 tablets by mouth daily, Disp: 45 tablet, Rfl: 1  oxyCODONE  (ROXICODONE ) 5 MG immediate release tablet, Take 1 tablet by mouth every 4 hours as needed for Pain., Disp: , Rfl:   Cyanocobalamin  (B-12) 2500 MCG TABS, Take 2,500 mcg by mouth daily, Disp: , Rfl:   Iron, Ferrous Sulfate , 325 (65 Fe) MG TABS, Take 325 mg by mouth daily, Disp: , Rfl:   docusate sodium  (COLACE) 100 MG capsule, Take 1  capsule by mouth daily as needed (Constipation), Disp: , Rfl:   potassium chloride  (KLOR-CON ) 10 MEQ extended release tablet, Take 1 tablet by mouth 2 times daily, Disp: , Rfl:   cyclobenzaprine (FEXMID) 7.5 MG tablet, Take 1 tablet by mouth daily, Disp: , Rfl:   senna (SENOKOT) 8.6 MG tablet, Take 2 tablets by mouth daily, Disp: , Rfl:   aspirin  81 MG tablet, Take 1 tablet by mouth daily (Patient not taking: Reported on 08/15/2023), Disp: , Rfl:     No current facility-administered medications for this visit.      ----------------------------------                   08/15/23                             1357            ----------------------------------   BP:  122/78            BP Site:     Left Upper Arm        Patient Position:        Sitting            BP Cuff Size:      Large Adult          Pulse:             80              Temp:      97.3 F (36.3 C)       TempSrc:        Temporal           Weight: 79.3 kg (174 lb 12.8 oz)   Height:       1.524 m (5')        ----------------------------------  Body mass index is 34.14 kg/m.     Wt Readings from Last 3 Encounters:  08/15/23 : 79.3 kg (174 lb 12.8 oz)  05/17/23 : 75.1 kg (165 lb 9.6 oz)  01/24/23 : 76.4 kg (168 lb 6.4 oz)    BP Readings from Last 3 Encounters:  08/15/23 : 122/78  05/17/23 : 122/80  01/24/23 : 116/64            Review of Systems   Constitutional:  Negative for fever.   Respiratory:  Negative for shortness of breath.    Cardiovascular:  Negative for chest pain.   Gastrointestinal:  Negative for abdominal pain, blood in stool, constipation and diarrhea.   Genitourinary:  Negative for difficulty urinating, dysuria and hematuria.       Objective:   Physical Exam  Constitutional:       General: She is not in acute distress.     Appearance: She is well-developed. She is not ill-appearing or diaphoretic.   Neck:      Thyroid: No thyroid mass or thyromegaly.   Cardiovascular:      Rate and Rhythm:  Normal rate and regular rhythm.      Heart sounds: Normal heart sounds. No murmur heard.     No friction rub. No gallop.   Pulmonary:      Effort: Pulmonary effort is normal. No tachypnea, accessory muscle usage or respiratory distress.      Breath sounds: Normal breath sounds. No decreased breath sounds, wheezing, rhonchi or rales.   Musculoskeletal:      Cervical back: Neck supple.   Lymphadenopathy:      Cervical: No cervical adenopathy.      Upper Body:      Right upper body: No supraclavicular adenopathy.      Left upper body: No supraclavicular adenopathy.   Skin:     General: Skin is warm and dry.      Coloration: Skin is not pale.   Neurological:      Mental Status: She is alert.         Assessment:   Assessment     Diagnosis Orders   1. Essential hypertension, benign        2. Acquired hypothyroidism        3. Other hyperlipidemia        4. Seronegative rheumatoid arthritis (HCC)          No change at this time   Still seeing hematology for MGUS  Still see cardiology and nephrology    She is still on prednisone  from dr  ali. Patient see her every 3 month     She has not done the rsv vaccine yet. We discussed       PLAN:   Plan    Consider the shingle vaccine   Consider the rsv vaccine   Continue the medicines  Stay with the diet   See the new doctor in August for a check up          Orysia Blas, MD

## 2023-08-15 NOTE — Patient Instructions (Signed)
 Consider the shingle vaccine   Consider the rsv vaccine   Continue the medicines  Stay with the diet   See the new doctor in August for a check up

## 2023-10-14 NOTE — Telephone Encounter (Signed)
 Patient due for a follow up on 12-23-2023. Patient will need to schedule with Dr. Mona Angle if staying with the practice.   LM for patient to call the office.

## 2023-10-15 MED ORDER — LEVOTHYROXINE SODIUM 100 MCG PO TABS
100 | ORAL_TABLET | Freq: Every day | ORAL | 0 refills | 90.00000 days | Status: DC
Start: 2023-10-15 — End: 2023-10-31

## 2023-10-15 NOTE — Telephone Encounter (Signed)
 PHARMACY REQUESTING ,,pt has a NTP 07.03.25

## 2023-10-31 ENCOUNTER — Ambulatory Visit: Admit: 2023-10-31 | Discharge: 2023-10-31 | Payer: Medicare (Managed Care)

## 2023-10-31 ENCOUNTER — Encounter

## 2023-10-31 ENCOUNTER — Inpatient Hospital Stay: Admit: 2023-10-31 | Payer: Medicare (Managed Care)

## 2023-10-31 ENCOUNTER — Inpatient Hospital Stay: Payer: Medicare (Managed Care)

## 2023-10-31 VITALS — BP 122/80 | HR 100 | Temp 98.40000°F | Ht 60.0 in | Wt 176.2 lb

## 2023-10-31 DIAGNOSIS — R0609 Other forms of dyspnea: Principal | ICD-10-CM

## 2023-10-31 DIAGNOSIS — I5022 Chronic systolic (congestive) heart failure: Principal | ICD-10-CM

## 2023-10-31 MED ORDER — MEDICAL COMPRESSION STOCKINGS MISC
Freq: Every day | 0 refills | 5.00000 days | Status: DC
Start: 2023-10-31 — End: 2024-02-05

## 2023-10-31 MED ORDER — ASPIRIN 81 MG PO TBEC
81 | Freq: Every day | ORAL | Status: AC
Start: 2023-10-31 — End: ?

## 2023-10-31 MED ORDER — LEVOTHYROXINE SODIUM 50 MCG PO TABS
50 | ORAL_TABLET | Freq: Every day | ORAL | 5 refills | Status: DC
Start: 2023-10-31 — End: 2023-11-03

## 2023-10-31 NOTE — Progress Notes (Signed)
 Jodi Walls (DOB:  07-12-1940) is a 83 y.o. female,Established patient, here for evaluation of the following chief complaint(s):  Established New Doctor (Former Dr. Cyrena Patient ) and Check-Up (Hypertension, thyroid )      Subjective       HPI    CHF (most recent EF 56%)/CAD/LBBB-Follows with Dr Sharps. Pt is on lasix , metoprolol ????. Pt was taken off of entresto  due to low BP   Allergy to atenolol --said it caused bradycardia  Need to restart asa. Some shortness of breath with exertion for the past 3 weeks. Some heart palpitations with this. Some chest pain.    Also some dizziness when lie in bed but not during the day. This has been going on for 3 mths. No ear fullness/tinnitis.    Increased swelling in legs.    CKD-Pt follows with Dr Annia.     RA-Pt follows with rheum. Pt is on prednisone  every other day.    MGUS/anemia-Pt follows with Dr Winn.    Hx of PE--11/2022. provoked after ortho surgery. Competed course of OAC--in feb..    Hypothyroidism-Pt is on synthroid . Last tsh 12/2022 (tsh 0.74 with free t4 1.5). No hx of thyroid cancer    Skin lesions-have had for months.Need referral for derm    Last blood work-CMP/CBC/TSH 12/2022. Overdue for lipid panel        Review of Systems   Constitutional:  Negative for chills and fever.   Respiratory:  Positive for shortness of breath. Negative for cough and wheezing.    Cardiovascular:  Positive for palpitations. Negative for chest pain (atypical).   Gastrointestinal:  Negative for abdominal pain, diarrhea, nausea and vomiting.   Genitourinary:  Negative for difficulty urinating, flank pain and frequency.   Neurological:  Positive for dizziness. Negative for light-headedness and headaches.   Psychiatric/Behavioral:  Negative for dysphoric mood. The patient is not nervous/anxious.              Objective     BP 122/80 (BP Site: Left Upper Arm, Patient Position: Sitting, BP Cuff Size: Large Adult)   Pulse 100   Temp 98.4 F (36.9 C) (Temporal)    Ht 1.524 m (5')   Wt 79.9 kg (176 lb 3.2 oz)   SpO2 97%   BMI 34.41 kg/m      Physical Exam  Vitals and nursing note reviewed.   Constitutional:       General: She is not in acute distress.  HENT:      Head: Normocephalic and atraumatic.   Neck:      Vascular: No carotid bruit.   Cardiovascular:      Rate and Rhythm: Normal rate and regular rhythm.      Pulses: Normal pulses.      Heart sounds: Normal heart sounds.   Pulmonary:      Effort: Pulmonary effort is normal.      Breath sounds: Normal breath sounds.   Musculoskeletal:      Right lower leg: Edema present.      Left lower leg: Edema present.   Skin:     General: Skin is warm and dry.   Neurological:      General: No focal deficit present.      Mental Status: She is alert and oriented to person, place, and time.   Psychiatric:         Mood and Affect: Mood normal.         Behavior: Behavior normal.  Jodi Criscuolo was seen today for established new doctor and check-up.    Diagnoses and all orders for this visit:    Chronic systolic (congestive) heart failure-continue Lasix .  Encourage compression stockings.  Will check chest x-ray as noted below given shortness of breath-recommend following up with Dr. Claudene given worsened shortness of breath and palpitations  -     Comprehensive Metabolic Panel; Future    Seronegative rheumatoid arthritis (HCC)-on prednisone  every other day.  Managed per rheumatology    Stage 3b chronic kidney disease (HCC)-chronic, stable.  Follow-up with nephrology    Acquired hypothyroidism-last TSH level was below 1 and goal for patient given age and comorbidities would be around 5.  Will reduce Synthroid  to 50 mcg daily and recheck levels in 6 weeks.  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  Patient follows with  hematology  -     CBC with Auto Differential; Future    Other hyperlipidemia  -     Lipid Panel; Future    History of pulmonary embolism-provoked.  Completed course of anticoagulation    Skin lesion  -     Non [facility] - External Referral To Dermatology    Coronary artery disease involving native coronary artery of native heart without angina pectoris-need to restart aspirin .  Follow-up with Dr. Claudene  -     aspirin  81 MG EC tablet; Take 1 tablet by mouth daily    Leg swelling-on Lasix .  Will order compression stockings  -     Elastic Bandages & Supports (MEDICAL COMPRESSION STOCKINGS) MISC; 1 each by Does not apply route daily    Dyspnea on exertion-will check chest x-ray.  Wonder if dyspnea on exertion is due to elevated heart rate.  This should improve with decreased dose of Synthroid .  Need to follow-up with Dr. Claudene  -     XR CHEST STANDARD (2 VW); Future    Vertigo-discussed vestibular therapy or ear nose and throat but patient declined both at this time.         Return in about 3 months (around 01/31/2024).      An electronic signature was used to authenticate this note.    --Greig JONELLE Minium, MD

## 2023-10-31 NOTE — Patient Instructions (Addendum)
 Need to see dermatology.  Will check blood work today.  Low salt diet. Less than 2 gm per day. Compression stockings.  Will adjust synthroid . Recheck thyroid levels in 6 weeks. Will also check lipid panel at that time.  Need to f/u with Dr Claudene.  Will check CXR  Will start asa 81 mg daily

## 2023-11-01 LAB — CBC WITH AUTO DIFFERENTIAL
Basophils %: 1.1 %
Basophils Absolute: 0.1 10*3/uL (ref 0.0–0.2)
Eosinophils %: 1.1 %
Eosinophils Absolute: 0.1 10*3/uL (ref 0.0–0.6)
Hematocrit: 42 % (ref 36.0–48.0)
Hemoglobin: 14.2 g/dL (ref 12.0–16.0)
Lymphocytes %: 13.2 %
Lymphocytes Absolute: 1 10*3/uL (ref 1.0–5.1)
MCH: 30 pg (ref 26.0–34.0)
MCHC: 33.7 g/dL (ref 31.0–36.0)
MCV: 88.9 fL (ref 80.0–100.0)
MPV: 9.4 fL (ref 5.0–10.5)
Monocytes %: 5.2 %
Monocytes Absolute: 0.4 10*3/uL (ref 0.0–1.3)
Neutrophils %: 79.4 %
Neutrophils Absolute: 6 10*3/uL (ref 1.7–7.7)
Platelets: 302 10*3/uL (ref 135–450)
RBC: 4.72 M/uL (ref 4.00–5.20)
RDW: 13.9 % (ref 12.4–15.4)
WBC: 7.6 10*3/uL (ref 4.0–11.0)

## 2023-11-01 LAB — COMPREHENSIVE METABOLIC PANEL
ALT: 18 U/L (ref 10–40)
AST: 27 U/L (ref 15–37)
Albumin/Globulin Ratio: 1.7 (ref 1.1–2.2)
Albumin: 4.3 g/dL (ref 3.4–5.0)
Alkaline Phosphatase: 118 U/L (ref 40–129)
Anion Gap: 14 (ref 3–16)
BUN: 23 mg/dL — ABNORMAL HIGH (ref 7–20)
CO2: 25 mmol/L (ref 21–32)
Calcium: 10.5 mg/dL (ref 8.3–10.6)
Chloride: 103 mmol/L (ref 99–110)
Creatinine: 2 mg/dL — ABNORMAL HIGH (ref 0.6–1.2)
Est, Glom Filt Rate: 24 — AB (ref >60)
Glucose: 116 mg/dL — ABNORMAL HIGH (ref 70–99)
Potassium: 4.9 mmol/L (ref 3.5–5.1)
Sodium: 142 mmol/L (ref 136–145)
Total Bilirubin: 0.6 mg/dL (ref 0.0–1.0)
Total Protein: 6.8 g/dL (ref 6.4–8.2)

## 2023-11-01 LAB — TSH REFLEX TO FT4: TSH Reflex FT4: 0.06 u[IU]/mL — ABNORMAL LOW (ref 0.27–4.20)

## 2023-11-01 LAB — T4, FREE: T4 Free: 2 ng/dL — ABNORMAL HIGH (ref 0.9–1.8)

## 2023-11-03 ENCOUNTER — Encounter

## 2023-11-03 MED ORDER — POTASSIUM CHLORIDE ER 10 MEQ PO TBCR
10 | ORAL_TABLET | Freq: Every day | ORAL | 0 refills | Status: AC
Start: 2023-11-03 — End: ?

## 2023-11-12 NOTE — Other (Unsigned)
 [facility]   Office Visit      Date of Service:  11/12/23    Chief Complaint:  Dyspnea    Primary Cardiologist:  Selinda Sharps, MD    Assessment    1. 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 LHC with no significant CAD.  She appears euvolemic on exam.  Normal BNP. NYHA class III symptoms.    GDMT: None  Diuretic: Lasix  20mg  QD    2. LBBB (left bundle branch block)  Present on previous ECGs. Recent ECG from ED visit not available for review.  STAT echocardiogram ordered to evaluate LVEF.    3. Agatston coronary artery calcium score less than 100  CACS < 100. LHC 11/2022 with no significant coronary disease.    4. Stage 3 chronic kidney disease, unspecified whether stage 3a or 3b CKD (CMS  HCC)  Follows with Nephrology.  Avoid nephrotoxic agents.    5. Dyspnea with exertion  Dyspnea has been present for the past 1 year with significant increase over   the  past 1 week. ED evaluation with normal troponins, normal BNP, ECG nonacute.  Chest CTA negative for PE.  She does have large type 4 hiatal hernia which   could  be contributing.  Interestingly, her Synthroid  dose was discontinued 1 week  prior to symptoms increasing.  STAT echocardiogram ordered to assess LVEF   given  history of HFrEF.    6. Chest pain  She has intermittent chest pain which she describes as sharp and lasts a few  sec.  Atypical for angina.  LHC August 2024 with no significant coronary  disease.  ECG nonacute in ED, normal troponins.  Low suspicion for cardiac  etiology of chest pain.      ICD-10-CM  1. 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 LBBB (left bundle branch block)  I44.7  3. Agatston coronary artery calcium score less than 100  R93.1  4. Hyperlipidemia, unspecified hyperlipidemia type  E78.5  5. Stage 3 chronic kidney disease, unspecified whether stage 3a or 3b CKD (CMS  HCC)  N18.30  6. Other acute pulmonary embolism without acute  cor pulmonale  I26.99  7. Severe obesity (BMI 35.0-39.9) with comorbidity (CMS HCC)  E66.01  8. Dyspnea on exertion  R06.09      Subjective  I had the pleasure of seeing Jodi Walls  today for dyspnea. She is a  pleasant 83 y.o. female with a past medical history of HFimpEF (32% -> 56%),  nonischemic cardiomyopathy, pulmonary embolism after orthopedic surgery, LBBB,  subclinical coronary disease with CACS < 100, hyperlipidemia, hypertension,   and  stage III CKD. LHC 11/2022 with no significant coronary disease.    She presented to [facility] ED on 11/08/23 for dyspnea. ED note not available   for  review. Chest CTA negative for PE but did show large type IV hiatal hernia.   BNP  53. ECG with sinus tachycardia with LBBB, HR 107.    Labs 10/31/23:  BNP 53, sodium 137, potassium 3.6, BUN 25, creatinine 1.91, GFR  25, hemoglobin 13.9, hematocrit 40.6, platelets 270,000  History of Present Illness  Jodi Walls is an 83 year old female with heart failure and non-ischemic  cardiomyopathy who presents with worsening shortness of breath and chest pain.  She is accompanied by her daughter.    She experiences worsening shortness of breath over the past week,  intensified  since her surgery last summer. Dyspnea occurs during activities such as  showering and dressing, and sometimes at rest. There is no cough, wheezing, or  fever.    Intermittent chest pain occurs both during exertion and at rest, described as   a  'shock' lasting a second, sometimes radiating to the shoulder blade. Symptoms  began after stopping thyroid medication and Entresto .    Leg swelling persists despite increased diuretic dosage. She denies   palpitations  but feels her heart races when out of breath. Dizziness occurs only when lying  on her back at night.    Her medication history includes recent cessation of Eliquis  and current use of  baby aspirin . She intermittently resumed thyroid medication. She is not on  cholesterol medication due to past  adverse reactions.    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 Energy levels are  low, and she has difficulty swallowing food, attributed to a known hiatal  hernia.    Past Medical History(1)    Past Surgical History(2)    Med List for Ambulatory Encounter(3)    Allergies(4)    Social History    Tobacco Use   Smoking status: Never    Passive exposure: Never   Smokeless tobacco: Never  Substance Use Topics   Alcohol use: No      Family History(5)    Review of Systems  Constitutional:  Positive for fatigue. Negative for chills, diaphoresis and  fever.  Respiratory:  Positive for shortness of breath. Negative for cough, chest  tightness and wheezing.       No orthopnea  No PND  Cardiovascular:  Positive for chest pain and leg swelling. Negative for  palpitations.  Gastrointestinal:  Negative for abdominal distention and blood in stool.  Genitourinary:  Negative for hematuria.  Neurological:  Positive for dizziness. Negative for syncope, weakness and  light-headedness.  Hematological:  Does not bruise/bleed easily.  All other systems reviewed and are negative.      Objective    Vitals:   11/12/23 1442  BP: 102/62  Pulse: 91  Weight: 173 lb 12.8 oz (78.8 kg)  Height: 5' (1.524 m)      Body mass index is 33.94 kg/m???.    Physical Exam  Vitals reviewed.  Constitutional:     General: She is not in acute distress.     Appearance: She is not diaphoretic.  HENT:     Head: Normocephalic and atraumatic.  Neck:     Vascular: No carotid bruit or JVD.  Cardiovascular:     Rate and Rhythm: Normal rate and regular rhythm.     Pulses: Normal pulses.       Carotid pulses are 2+ on the right side and 2+ on the left side.       Radial pulses are 2+ on the right side and 2+ on the left side.     Heart sounds: Normal heart sounds. No murmur heard.  Pulmonary:     Effort: Pulmonary effort is normal. No respiratory distress.     Breath sounds: Normal breath sounds. No decreased breath sounds, wheezing,  rhonchi or  rales.  Abdominal:     Palpations: Abdomen is soft.  Musculoskeletal:     Right lower leg: No edema.     Left lower leg: No edema.  Skin:     General: Skin is warm and dry.  Neurological:     Mental Status: She is alert and oriented to person,  place, and time.  Psychiatric:     Judgment: Judgment normal.        Results  Echocardiogram 01/2023:  - Left ventricle: The cavity size is normal. There is focal basal    hypertrophy of the septum. Systolic function is normal. The estimated    ejection fraction is 58%. The ejection fraction is 56%. Ejection fraction    (EF) was calculated using 3D full volume imaging method.  - Aortic valve: The valve is structurally normal. The valve is trileaflet.  Impressions:  Compared to the previous study, these findings represent  improvement in the left ventricular ejection fraction which was previously  calculated 32%.    Lab Results  Component Value Date   WBC 6.24 01/01/2023   HGB 11.6 (LOW) 01/01/2023   HCT 35.5 01/01/2023   MCV 90.3 01/01/2023   PLT 214 01/01/2023      Lab Results  Component Value Date   NA 138 01/01/2023   K 4.0 01/01/2023   CO2 19 (LOW) 01/01/2023   PHOS 3.2 08/15/2018   BUN 18 01/01/2023   CREATININE 1.24 (HIGH) 01/01/2023      Lab Results  Component Value Date   BNP 1,745 (HIGH) 12/27/2022   DDIMER 2,492 (HIGH) 12/27/2022   HGBA1C 5.4 12/10/2022      Lab Results  Component Value Date   INR 1.0 12/27/2022   PROTIME 12.3 12/27/2022      No results found for: CHL, LDLCALCEXT, HDL, TRIG    Orders Placed This Encounter  Procedures   2D ECHO COMPLETE W/STRAIN/3D PRN CONTR/BUBBLE      Return in about 6 weeks (around 12/24/2023), or if symptoms worsen or fail to  improve, for Dr. Claudene .    Future Appointments  Date Time Provider Department Center  12/11/2023  8:00 AM Claudene Selinda LABOR., MD [facility]  03/30/2024  1:00 PM Claudene Selinda LABOR., MD [facility]      Thank you for allowing me to participate in the care of this patient.    Geofm Dusky, NP  [id number]  [place], [place] [address]  Ph: [phone]  Fax: [phone]    The patient/family provided verbal consent to the use of ambient listening  technology/audio recording during this visit. I reviewed/edited the note   before  signing.        [facility]            (1)  Past Medical History:  Diagnosis Date   Arthritis   left knee OA   Back problem   s/p surgery   Breast cancer (CMS HCC)   treated with radical mastectomy   Cardiomyopathy   Coronary artery disease   CRF (chronic renal failure)   stage 3; monitored; nephrologist - Dr. Marene   GERD (gastroesophageal reflux disease)   Hiatal hernia   Hx of blood clots 2002   hand/arm post surgery   HX OTHER MEDICAL   NO BP or VENIPUNCTURE RIGHT ARM; s/p mastectomy   Hyperlipidemia   Hypertension   Kidney stones   passed   LBBB (left bundle branch block)   Nausea & vomiting   Neck problem   s/p surgery   Postoperative nausea and vomiting   RA (rheumatoid arthritis) (CMS HCC)   Thyroid disorder   hypo  (2)  Past Surgical History:  Procedure Laterality Date   HX BACK SURGERY   HX CATARACT REMOVAL WITH IMPLANT   HX CHOLECYSTECTOMY   HX COLONOSCOPY  HX DENTAL SURGERY   HX FINGER TRIGGER RELEASE Left   HX HYSTERECTOMY   HX MASTECTOMY   right mastectomy with breast implant   HX NECK SURGERY   HX TOTAL KNEE ARTHROPLASTY Left   INTERVENTION - CORONARY  12/28/2022   INTERVENTION - CORONARY performed by Solmon Enriqueta SAUNDERS., MD at [facility] CARDIAC  CATH LAB   LEFT HEART CATH  12/28/2022   LEFT HEART CATH w/SELECT COR performed by Solmon Enriqueta SAUNDERS., MD at [facility]  CARDIAC CATH LAB   PR APPENDECTOMY   PR ARTHROPLASTY KNEE HINGE PROSTHESIS Right 12/24/2022   Right Total Knee Arthroplasty performed by Omar Belvie MATSU., MD at [facility]   PR ARTHRP KNE CONDYLE&PLATU MEDIAL&LAT COMPARTMENTS  06/03/2013   lt  (3)  Medications List  Medication Sig   aspirin  81 mg Tablet, Delayed Release (E.C.) Take 81 mg by mouth daily.   cholecalciferol  (VITAMIN D-3) 10 mcg (400 unit) Tablet Take 400 Units by   mouth  daily.   cyanocobalamin  100 mcg Tablet Take 2,500 mcg by mouth in the morning.   cyclobenzaprine (FEXMID) 7.5 mg Tablet Take 7.5 mg by mouth daily.   docusate sodium  (COLACE) 100 mg capsule Take 100 mg by mouth daily as needed  for Constipation.   furosemide  (LASIX ) 40 mg tablet Take 20 mg by mouth daily.   levothyroxine  (SYNTHROID ) 100 mcg tablet Take 50 mcg by mouth in the morning.  daily.   Magnesium 250 mg Tablet Take 250 mg by mouth daily.   ondansetron  (ZOFRAN -ODT) 4 mg disintegrating tablet Dissolve 4 mg on tongue  every 8 hours as needed for Nausea or Vomiting.   oxyCODONE  5 mg PO immediate release tablet Take 5 mg by mouth every 4 hours   as  needed for Pain.   pantoprazole  (PROTONIX ) 40 mg Tablet, Delayed Release (E.C.) Take 1 Tablet   (40  mg) by mouth daily.   potassium chloride  (KDUR) 10 mEq PO tablet Take 5 mEq by mouth in the morning  and 5 mEq before bedtime.   predniSONE  (DELTASONE ) 5 mg tablet Take 5 mg by mouth every other day.   senna (SENOKOT) 8.6 mg Take 2 Tablets by mouth daily.    No current facility-administered medications for this visit.  (4)  Allergies  Allergen Reactions   Ace Inhibitors    cough   Atenolol     Bradycardia (HR 40's)   Codeine Nausea And Vomiting   Lipitor (Atorvastatin)    Muscle aches   Azithromycin   Penicillins   Triamterene   (5)  Family History  Problem Relation Name Age of Onset   Heart Problems Father       bypass surgery   Anesthesia Complications Neg Hx

## 2023-12-10 ENCOUNTER — Encounter

## 2023-12-10 LAB — BASIC METABOLIC PANEL
Anion Gap: 12 (ref 3–16)
BUN: 24 mg/dL — ABNORMAL HIGH (ref 7–20)
CO2: 26 mmol/L (ref 21–32)
Calcium: 9.8 mg/dL (ref 8.3–10.6)
Chloride: 103 mmol/L (ref 99–110)
Creatinine: 1.8 mg/dL — ABNORMAL HIGH (ref 0.6–1.2)
Est, Glom Filt Rate: 27 — AB (ref >60)
Glucose: 106 mg/dL — ABNORMAL HIGH (ref 70–99)
Potassium: 4 mmol/L (ref 3.5–5.1)
Sodium: 141 mmol/L (ref 136–145)

## 2023-12-10 LAB — LIPID PANEL
Cholesterol, Total: 273 mg/dL — ABNORMAL HIGH (ref 0–199)
HDL: 101 mg/dL — ABNORMAL HIGH (ref 40–60)
LDL Cholesterol: 152 mg/dL — ABNORMAL HIGH (ref <100)
Triglycerides: 98 mg/dL (ref 0–150)
VLDL Cholesterol Calculated: 20 mg/dL

## 2023-12-10 LAB — TSH REFLEX TO FT4: TSH Reflex FT4: 43.7 u[IU]/mL — ABNORMAL HIGH (ref 0.27–4.20)

## 2023-12-11 LAB — T4, FREE: T4 Free: 0.4 ng/dL — ABNORMAL LOW (ref 0.9–1.8)

## 2023-12-11 MED ORDER — LEVOTHYROXINE SODIUM 25 MCG PO TABS
25 | ORAL_TABLET | Freq: Every day | ORAL | 1 refills | 90.00000 days | Status: DC
Start: 2023-12-11 — End: 2024-02-07

## 2023-12-12 NOTE — Telephone Encounter (Signed)
 PT AWARE ,NO QUESTIONS

## 2024-02-06 ENCOUNTER — Encounter

## 2024-02-06 ENCOUNTER — Ambulatory Visit: Admit: 2024-02-06 | Discharge: 2024-02-06 | Payer: Medicare (Managed Care)

## 2024-02-06 NOTE — Progress Notes (Signed)
 "Medicare Annual Wellness Visit    Jodi Walls is here for 3 Month Follow-Up (Thyroid, hypertension ) and Medicare AWV    Assessment & Plan   Stage 3b chronic kidney disease (HCC)  Assessment & Plan:   Chronic, at goal (stable), managed per nephrology         Seronegative rheumatoid arthritis (HCC)  Assessment & Plan:   Chronic, at goal (stable), managed per rheumatology         Chronic systolic (congestive) heart failure  Assessment & Plan:   Chronic, stable.  Patient is euvolemic today.  Discussed low-salt diet.  Discussed compression stockings and elevating legs.  Daily weights.  Follow-up with cardiology.  Patient is taking Lasix  2 times a day as needed.  Asked patient to go for minutes 20 mg dose.         MGUS (monoclonal gammopathy of unknown significance)  Assessment & Plan:   Monitored by specialist- no acute findings meriting change in the plan         Acquired hypothyroidism  Assessment & Plan:   Thyroid levels have been labile.  Most recently restarted Synthroid  and due for repeat thyroid levels    Orders:    TSH reflex to FT4; Future    Orders:  -     TSH reflex to FT4; Future  Anemia, unspecified type  Assessment & Plan:   Monitored by specialist- no acute findings meriting change in the plan         Medicare annual wellness visit, subsequent       Return in about 4 months (around 06/08/2024) for Follow up for chronic medical conditions.     Subjective   Medicare AWV    Patient's complete Health Risk Assessment and screening values have been reviewed and are found in Flowsheets. The following problems were reviewed today and where indicated follow up appointments were made and/or referrals ordered.    Positive Risk Factor Screenings with Interventions:    Fall Risk:  Do you feel unsteady or are you worried about falling? :  (Patient uses a cane to ambulate with)  2 or more falls in past year?: no  Fall with injury in past year?: noInterventions:    Reviewed medications, home hazards, visual acuity, and  co-morbidities that can increase risk for falls  See AVS for additional education material         Controlled Medication Review:    Today's Pain Level: No data recorded   Opioid Risk: (Low risk score <55) Opioid risk score: 22    Patient is low risk for opioid use disorder or overdose.    Last PDMP [place] as Reviewed:  Review User Review Instant Review Result              Last Controlled Substance Monitoring Documentation      Flowsheet Row Telephone from 11/22/2016 in [facility]   Attestation The Prescription Monitoring Report for this patient was reviewed today. filed at 11/22/2016 1511   Periodic Controlled Substance Monitoring No signs of potential drug abuse or diversion identified. filed at 11/22/2016 1511       General HRA Questions:  Select all that apply:  (Patient has arthritis that flares up)Interventions - Pain:  See AVS for additional education material  Interventions Fatigue:  See AVS for additional education material      Inactivity:  On average, how many days per week do you engage in moderate to strenuous exercise (like a brisk walk)?: 0  days (!) Abnormal  On average, how many minutes do you engage in exercise at this level?: 20 min  Interventions:  See AVS for additional education material    Poor Eating Habits/Diet:  Do you eat balanced/healthy meals regularly?:  (Patient encouraged to more healthy balanced meals)  Interventions:  See AVS for additional education material    Abnormal BMI (obese):  Body mass index is 35.11 kg/m. (!) Abnormal  Interventions:  See AVS for additional education material          Vision Screen:  Do you have difficulty driving, watching TV, or doing any of your daily activities because of your eyesight?: No  Have you had an eye exam within the past year?:  (Patient encouraged to follow up with provider)  Interventions:   See AVS for additional education material      ADL's:   Patient reports needing help with:  Select all that apply:  (Patient gets help  from family)  Interventions:  See AVS for additional education material    Advanced Directives:  Do you have a Living Will?:  (Patient given the information)  Intervention:  has NO advanced directive - information provided                     Objective   Vitals:    02/06/24 1038   BP: 122/78   BP Site: Left Upper Arm   Patient Position: Sitting   BP Cuff Size: Medium Adult   Pulse: 80   Temp: 97 F (36.1 C)   TempSrc: Temporal   SpO2: 99%   Weight: 81.6 kg (179 lb 12.8 oz)   Height: 1.524 m (5')      Body mass index is 35.11 kg/m.                  Allergies   Allergen Reactions    Pcn [Penicillins] Swelling     Face and hands swelled    Triamterene       Severe stomach pain,SOB,severe cramps throughout her body    Ace Inhibitors Other (See Comments)     cough    Atenolol  Other (See Comments)     Bradycardia (HR 40's)    Atorvastatin Other (See Comments)     Muscle aches    Zithromax [Azithromycin] Swelling    Codeine Nausea And Vomiting     Prior to Visit Medications   Medication Sig Taking? Authorizing Provider   levothyroxine  (SYNTHROID ) 25 MCG tablet Take 1 tablet by mouth daily Yes Spivey, Amy R, MD   potassium chloride  (KLOR-CON ) 10 MEQ extended release tablet Take 1 tablet by mouth daily Yes Spivey, Amy R, MD   aspirin  81 MG EC tablet Take 1 tablet by mouth daily Yes Spivey, Amy R, MD   pantoprazole  (PROTONIX ) 40 MG tablet TAKE 1 TABLET BY MOUTH DAILY Yes [name], MD   cyclobenzaprine (FEXMID) 7.5 MG tablet Take 1 tablet by mouth daily Yes [provider]   senna (SENOKOT) 8.6 MG tablet Take 2 tablets by mouth daily Yes [provider]   Magnesium 250 MG CAPS Take by mouth Yes [provider]   ondansetron  (ZOFRAN -ODT) 4 MG disintegrating tablet PLACE 1 TABLET ON THE TONGUE AND LET DISSOLVE AND THEN SWALLOW THREE TIMES DAILY AS NEEDED FOR NAUSEA OR VOMITING Yes [name], MD   predniSONE  (DELTASONE ) 5 MG tablet Take 1 tablet by mouth every other day Yes [name], MD    furosemide  (LASIX ) 40 MG  tablet Take 0.5 tablets by mouth daily Yes [name], MD   oxyCODONE  (ROXICODONE ) 5 MG immediate release tablet Take 1 tablet by mouth every 4 hours as needed for Pain. Yes [provider]   Cyanocobalamin  (B-12) 2500 MCG TABS Take 2,500 mcg by mouth daily Yes [provider]   docusate sodium  (COLACE) 100 MG capsule Take 1 capsule by mouth daily as needed (Constipation) Yes [provider]       CareTeam (Including outside providers/suppliers regularly involved in providing care):   Patient Care Team:  Ernesto Greig SAUNDERS, MD as PCP - General (Internal Medicine)  Winn, Alverna SHAUNNA Raddle., MD as PCP - Hematology/Oncology (Hematology and Oncology)  Ernesto Greig SAUNDERS, MD as PCP - Empaneled Provider     Recommendations for Preventive Services Due: see orders and patient instructions/AVS.  Recommended screening schedule for the next 5-10 years is provided to the patient in written form: see Patient Instructions/AVS.     Reviewed and updated this visit:  Tobacco  Allergies  Meds  Problems         I, Corean Austin, LPN, 89/[id number]/[id number], performed the documented evaluation under the direct supervision of the attending physician.     "

## 2024-02-06 NOTE — Progress Notes (Signed)
 "    Jodi Walls (DOB:  1940-08-17) is a 83 y.o. female,Established patient, here for evaluation of the following chief complaint(s):  3 Month Follow-Up (Thyroid, hypertension )      Assessment & Plan  Stage 3b chronic kidney disease (HCC)   Chronic, at goal (stable), managed per nephrology         Seronegative rheumatoid arthritis (HCC)   Chronic, at goal (stable), managed per rheumatology         Chronic systolic (congestive) heart failure   Chronic, stable.  Patient is euvolemic today.  Discussed low-salt diet.  Discussed compression stockings and elevating legs.  Daily weights.  Follow-up with cardiology.  Patient is taking Lasix  2 times a day as needed.  Asked patient to make sure she is taking 20 mg dose         MGUS (monoclonal gammopathy of unknown significance)   Monitored by specialist- no acute findings meriting change in the plan         Acquired hypothyroidism   Thyroid levels have been labile.  Most recently restarted Synthroid  and due for repeat thyroid levels    Orders:    TSH reflex to FT4; Future    Anemia, unspecified type   Monitored by specialist- no acute findings meriting change in the plan           No follow-ups on file.    Subjective       HPI    CHF (most recent EF 56%)/CAD/LBBB-Follows with Dr Sharps. Pt is on lasix . Allergy to atenolol --said it caused bradycardia  --At last appt, c/o dyspnea and palps. CXR done was nonacute. Pt was seen by cards.    Intermittent issue with shortness of breath. Trouble with humidity.  Can occur at rest or with activity. Sometimes have some chest pain with these sx.  Not lately  Increased swelling in legs.  Most days pt takes lasix  two times per day. Monitors weight.     CKD-Pt follows with Dr Annia.      RA-Pt follows with rheum. Pt is on prednisone  every other day.     MGUS/anemia-Pt follows with Dr Winn.     Hx of PE--11/2022. provoked after ortho surgery. Competed course of OAC--in feb..     Hypothyroidism-TSH was 0.06--synthroid  was stopped  and repeat TSH was elevated. Pt was restarted on synthroid  and is overdue for repeat levels      Last blood work-tsh, lipid, bmp 12/10/23   Review of Systems   Constitutional:  Negative for chills and fever.   Respiratory:  Positive for shortness of breath (chronic, intermittent issue). Negative for cough and wheezing.    Cardiovascular:  Positive for chest pain (see above) and leg swelling (chronic issue). Negative for palpitations.   Gastrointestinal:  Negative for nausea and vomiting.   Genitourinary:  Negative for dysuria.   Neurological:  Negative for dizziness, light-headedness and headaches.   Psychiatric/Behavioral:  Negative for dysphoric mood. The patient is not nervous/anxious.                Objective     BP 122/78 (BP Site: Left Upper Arm, Patient Position: Sitting, BP Cuff Size: Medium Adult)   Pulse 80   Temp 97 F (36.1 C) (Temporal)   Ht 1.524 m (5')   Wt 81.6 kg (179 lb 12.8 oz)   SpO2 99%   BMI 35.11 kg/m      Physical Exam  Constitutional:       Appearance: Normal  appearance.   Cardiovascular:      Rate and Rhythm: Normal rate and regular rhythm.      Pulses: Normal pulses.      Heart sounds: Normal heart sounds.   Musculoskeletal:      Right lower leg: Edema (trace) present.      Left lower leg: Edema (trace) present.   Neurological:      General: No focal deficit present.      Mental Status: She is alert and oriented to person, place, and time.   Psychiatric:         Mood and Affect: Mood normal.         Behavior: Behavior normal.                      An electronic signature was used to authenticate this note.    --Greig JONELLE Minium, MD   "

## 2024-02-06 NOTE — Assessment & Plan Note (Addendum)
"   Thyroid levels have been labile.  Most recently restarted Synthroid  and due for repeat thyroid levels    Orders:    TSH reflex to FT4; Future    "

## 2024-02-06 NOTE — Assessment & Plan Note (Addendum)
"   Chronic, stable.  Patient is euvolemic today.  Discussed low-salt diet.  Discussed compression stockings and elevating legs.  Daily weights.  Follow-up with cardiology.  Patient is taking Lasix  2 times a day as needed.  Asked patient to make sure she is taking 20 mg dose         "

## 2024-02-06 NOTE — Assessment & Plan Note (Addendum)
"   Chronic, at goal (stable), managed per nephrology         "

## 2024-02-06 NOTE — Assessment & Plan Note (Addendum)
"   Chronic, at goal (stable), managed per rheumatology         "

## 2024-02-06 NOTE — Assessment & Plan Note (Addendum)
"   Monitored by specialist- no acute findings meriting change in the plan         "

## 2024-02-06 NOTE — Patient Instructions (Addendum)
 "Low salt diet--less than 2 gms daily.  Verify that lasix  dose is only 20 mg  Need blood work today         Preventing Falls: Care Instructions  Injuries and health problems such as trouble walking or poor eyesight can increase your risk of falling. So can some medicines. But there are things you can do to help prevent falls. You can exercise to get stronger. You can also arrange your home to make it safer.    Talk to your doctor about the medicines you take. Ask if any of them increase the risk of falls and whether they can be changed or stopped.   Try to exercise regularly. It can help improve your strength and balance. This can help lower your risk of falling.         Practice fall safety and prevention.   Wear low-heeled shoes that fit well and give your feet good support. Talk to your doctor if you have foot problems that make this hard.  Carry a cellphone or wear a medical alert device that you can use to call for help.  Use stepladders instead of chairs to reach high objects. Don't climb if you're at risk for falls. Ask for help, if needed.  Wear the correct eyeglasses, if you need them.        Make your home safer.   Remove rugs, cords, clutter, and furniture from walkways.  Keep your house well lit. Use night-lights in hallways and bathrooms.  Install and use sturdy handrails on stairways.  Wear nonskid footwear, even inside. Don't walk barefoot or in socks without shoes.        Be safe outside.   Use handrails, curb cuts, and ramps whenever possible.  Keep your hands free by using a shoulder bag or backpack.  Try to walk in well-lit areas. Watch out for uneven ground, changes in pavement, and debris.  Be careful in the winter. Walk on the grass or gravel when sidewalks are slippery. Use de-icer on steps and walkways. Add non-slip devices to shoes.    Put grab bars and nonskid mats in your shower or tub and near the toilet. Try to use a shower chair or bath bench when bathing.   Get into a tub or shower by  putting in your weaker leg first. Get out with your strong side first. Have a phone or medical alert device in the bathroom with you.   Where can you learn more?  Go to [employer] and enter G117 to learn more about Preventing Falls: Care Instructions.  Current as of: November 28, 2022  Content Version: 14.6   2024-2025 [place], [place].   Care instructions adapted under license by [facility]. If you have questions about a medical condition or this instruction, always ask your healthcare professional. Romayne Alderman, [facility], disclaims any warranty or liability for your use of this information.         Fatigue: Care Instructions  Overview     Fatigue is a feeling of tiredness, exhaustion, or lack of energy. You may feel fatigue because of too much or not enough activity. It can also come from stress, lack of sleep, boredom, and poor diet. Many medical problems, such as viral infections, can cause fatigue. Emotional problems, especially depression, are often the cause of fatigue.  Fatigue is most often a symptom of another problem. Treatment for fatigue depends on the cause. For example, if you have fatigue because you have a certain health  problem, treating this problem also treats your fatigue. If depression or anxiety is the cause, treatment may help.  Follow-up care is a key part of your treatment and safety. Be sure to make and go to all appointments, and call your doctor if you are having problems. It's also a good idea to know your test results and keep a list of the medicines you take.  How can you care for yourself at home?  Get regular exercise. But try not to overdo it. It may help to go back and forth between rest and exercise.  Get plenty of rest.  Eat a variety of healthy foods. Try not to skip any meals.  Avoid or try to cut back on your use of caffeine, tobacco, and alcohol. Caffeine is most often found in coffee, tea, cola drinks, and energy drinks.  Limit medicines  that can cause fatigue. These include medicines such as cold and allergy medicines.  When should you call for help?  Watch closely for changes in your health, and be sure to contact your doctor if:    You have new symptoms such as fever or a rash.     Your fatigue gets worse.     You have been feeling down, depressed, or hopeless. Or you may have lost interest in things that you usually enjoy.     You are not getting better as expected.   Where can you learn more?  Go to [employer] and enter [phone] to learn more about Fatigue: Care Instructions.  Current as of: November 28, 2022  Content Version: 14.6   2024-2025 [place], [place].   Care instructions adapted under license by [facility]. If you have questions about a medical condition or this instruction, always ask your healthcare professional. Romayne Alderman, [facility], disclaims any warranty or liability for your use of this information.         Learning About Being Active as an Older Adult  Why is being active important as you get older?     Being active is one of the best things you can do for your health. And it's never too late to start. Being active--or getting active, if you aren't already--has definite benefits. It can:  Give you more energy,  Keep your mind sharp.  Improve balance to reduce your risk of falls.  Help you manage chronic illness with fewer medicines.  No matter how old you are, how fit you are, or what health problems you have, there is a form of activity that will work for you. And the more physical activity you can do, the better your overall health will be.  What kinds of activity can help you stay healthy?  Being more active will make your daily activities easier. Physical activity includes planned exercise and things you do in daily life. There are four types of activity:  Aerobic.  Doing aerobic activity makes your heart and lungs strong.  Includes walking, dancing, and gardening.  Aim for at least 2 hours  spread throughout the week.  It improves your energy and can help you sleep better.  Muscle-strengthening.  This type of activity can help maintain muscle and strengthen bones.  Includes climbing stairs, using resistance bands, and lifting or carrying heavy loads.  Aim for at least twice a week.  It can help protect the knees and other joints.  Stretching.  Stretching gives you better range of motion in joints and muscles.  Includes upper arm stretches, calf stretches, and gentle yoga.  Aim for at least twice a week, preferably after your muscles are warmed up from other activities.  It can help you function better in daily life.  Balancing.  This helps you stay coordinated and have good posture.  Includes heel-to-toe walking, tai chi, and certain types of yoga.  Aim for at least 3 days a week.  It can reduce your risk of falling.  Even if you have a hard time meeting the recommendations, it's better to be more active than less active. All activity done in each category counts toward your weekly total. You'd be surprised how daily things like carrying groceries, keeping up with grandchildren, and taking the stairs can add up.  What keeps you from being active?  If you've had a hard time being more active, you're not alone. Maybe you remember being able to do more. Or maybe you've never thought of yourself as being active. It's frustrating when you can't do the things you want. Being more active can help. What's holding you back?  Getting started.  Have a goal, but break it into easy tasks. Small steps build into big accomplishments.  Staying motivated.  If you feel like skipping your activity, remember your goal. Maybe you want to move better and stay independent. Every activity gets you one step closer.  Not feeling your best.  Start with 5 minutes of an activity you enjoy. Prove to yourself you can do it. As you get comfortable, increase your time.  You may not be where you want to be. But you're in the process of  getting there. Everyone starts somewhere.  How can you find safe ways to stay active?  Talk with your doctor about any physical challenges you're facing. Make a plan with your doctor if you have a health problem or aren't sure how to get started with activity.  If you're already active, ask your doctor if there is anything you should change to stay safe as your body and health change.  If you tend to feel dizzy after you take medicine, avoid activity at that time. Try being active before you take your medicine. This will reduce your risk of falls.  If you plan to be active at home, make sure to clear your space before you get started. Remove things like TV cords, coffee tables, and throw rugs. It's safest to have plenty of space to move freely.  The key to getting more active is to take it slow and steady. Try to improve only a little bit at a time. Pick just one area to improve on at first. And if an activity hurts, stop and talk to your doctor.  Where can you learn more?  Go to [employer] and enter P600 to learn more about Learning About Being Active as an Older Adult.  Current as of: November 28, 2022  Content Version: 14.6   2024-2025 [place], [place].   Care instructions adapted under license by [facility]. If you have questions about a medical condition or this instruction, always ask your healthcare professional. Romayne Alderman, [facility], disclaims any warranty or liability for your use of this information.         Learning About Vision Tests  What are vision tests?     The four most common vision tests are visual acuity tests, refraction, visual field tests, and color vision tests.  Visual acuity (sharpness) tests  These tests are used:  To see if you need glasses or contact lenses.  To monitor an  eye problem.  To check an eye injury.  Visual acuity tests are done as part of routine exams. You may also have this test when you get your driver's license or apply for some types of  jobs.  Visual field tests  These tests are used:  To check for vision loss in any area of your range of vision.  To screen for certain eye diseases.  To look for nerve damage after a stroke, head injury, or other problem that could reduce blood flow to the brain.  Refraction and color tests  A refraction test is done to find the right prescription for glasses and contact lenses.  A color vision test is done to check for color blindness.  Color vision is often tested as part of a routine exam. You may also have this test when you apply for a job where recognizing different colors is important, such as truck driving, optician, dispensing, or the [employer].  How are vision tests done?  Visual acuity test   You cover one eye at a time.  You read aloud from a wall chart across the room.  You read aloud from a small card that you hold in your hand.  Refraction   You look into a special device.  The device puts lenses of different strengths in front of each eye to see how strong your glasses or contact lenses need to be.  Visual field tests   Your doctor may have you look through special machines.  Or your doctor may simply have you stare straight ahead while they move a finger into and out of your field of vision.  Color vision test   You look at pieces of printed test patterns in various colors. You say what number or symbol you see.  Your doctor may have you trace the number or symbol using a pointer.  How do these tests feel?  There is very little chance of having a problem from this test. If dilating drops are used for a vision test, they may make the eyes sting and cause a medicine taste in the mouth.  Follow-up care is a key part of your treatment and safety. Be sure to make and go to all appointments, and call your doctor if you are having problems. It's also a good idea to know your test results and keep a list of the medicines you take.  Where can you learn more?  Go to [employer] and enter G551  to learn more about Learning About Vision Tests.  Current as of: November 28, 2022  Content Version: 14.6   2024-2025 [place], [place].   Care instructions adapted under license by [facility]. If you have questions about a medical condition or this instruction, always ask your healthcare professional. Romayne Alderman, [facility], disclaims any warranty or liability for your use of this information.         Learning About Activities of Daily Living  What are activities of daily living?     Activities of daily living (ADLs) are the basic self-care tasks you do every day. These include eating, bathing, dressing, and moving around.  As you age, and if you have health problems, you may find that it's harder to do some of these tasks. If so, your doctor can suggest ideas that may help.  To measure what kind of help you may need, your doctor will ask how well you are able to do ADLs. Let your doctor know if there are any tasks that  you are having trouble doing. This is an important first step to getting help. And when you have the help you need, you can stay as independent as possible.  How will a doctor assess your ADLs?  Asking about ADLs is part of a routine health checkup your doctor will likely do as you age. Your health check might be done in a doctor's office, in your home, or at a hospital. The goal is to find out if you are having any problems that could make it hard to care for yourself or that make it unsafe for you to be on your own.  To measure your ADLs, your doctor will ask how hard it is for you to do routine tasks. Your doctor may also want to know if you have changed the way you do a task because of a health problem. Your doctor may watch how you:  Walk back and forth.  Keep your balance while you stand or walk.  Move from sitting to standing or from a bed to a chair.  Button or unbutton a civil service fast streamer.  Remove and put on your shoes.  It's common to feel a little worried or anxious if you find you  can't do all the things you used to be able to do. Talking with your doctor about ADLs is a way to make sure you're as safe as possible and able to care for yourself as well as you can. You may want to bring a caregiver, friend, or family member to your checkup. They can help you talk to your doctor.  Follow-up care is a key part of your treatment and safety. Be sure to make and go to all appointments, and call your doctor if you are having problems. It's also a good idea to know your test results and keep a list of the medicines you take.  Current as of: February 21, 2023  Content Version: 14.6   2024-2025 [place], [place].   Care instructions adapted under license by [facility]. If you have questions about a medical condition or this instruction, always ask your healthcare professional. Romayne Alderman, [facility], disclaims any warranty or liability for your use of this information.         Eating Healthy Foods: Care Instructions  With every meal, you can make healthy food choices. Try to eat a variety of fruits, vegetables, whole grains, lean proteins, and low-fat dairy products. This can help you get the right balance of nutrients, including vitamins and minerals. Small changes add up over time. You can start by adding one healthy food to your meals each day.    Try to make half your plate fruits and vegetables, one-fourth whole grains, and one-fourth lean proteins. Try including dairy with your meals.   Eat more fruits and vegetables. Try to have them with most meals and snacks.   Foods for healthy eating        Fruits   These can be fresh, frozen, canned, or dried.  Try to choose whole fruit rather than fruit juice.  Eat a variety of colors.        Vegetables   These can be fresh, frozen, canned, or dried.  Beans, peas, and lentils count too.        Whole grains   Choose whole-grain breads, cereals, and noodles.  Try brown rice.        Lean proteins   These can include lean meat, poultry, fish, and  eggs.  You  can also have tofu, beans, peas, lentils, nuts, and seeds.        Dairy   Try milk, yogurt, and cheese.  Choose low-fat or fat-free when you can.  If you need to, use lactose-free milk or fortified plant-based milk products, such as soy milk.        Water   Drink water when you're thirsty.  Limit sugar-sweetened drinks, including soda, fruit drinks, and sports drinks.  Where can you learn more?  Go to [employer] and enter T756 to learn more about Eating Healthy Foods: Care Instructions.  Current as of: February 04, 2023  Content Version: 14.6   2024-2025 [place], [place].   Care instructions adapted under license by [facility]. If you have questions about a medical condition or this instruction, always ask your healthcare professional. Romayne Alderman, [facility], disclaims any warranty or liability for your use of this information.         Starting a Weight-Loss Plan: Care Instructions  Overview    It can be a challenge to lose weight. But your doctor can help you make a weight-loss plan that meets your needs.  You don't have to make a lot of big changes at once. A better idea might be to focus on small changes and stick with them. When those changes become habit, you can add a few more changes.  Some people find it helpful to take an exercise or nutrition class. If you have questions, ask your doctor about seeing a registered dietitian or an exercise specialist. You might also think about joining a weight-loss support group.  If you're not ready to make changes right now, try to pick a date in the future. Then make an appointment with your doctor to talk about when and how you'll get started with a plan.  Follow-up care is a key part of your treatment and safety. Be sure to make and go to all appointments, and call your doctor if you are having problems. It's also a good idea to know your test results and keep a list of the medicines you take.  How can you care for yourself  as you start a weight-loss plan?   Set realistic goals. Many people expect to lose much more weight than is likely. A weight loss of 5% to 10% of your body weight may be enough to improve your health.  Get family and friends involved to provide support. Talk to them about why you are trying to lose weight, and ask them to help. They can help by participating in exercise and having meals with you, even if they may be eating something different.  Find what works best for you. If you do not have time or do not like to cook, a program that offers meal replacement bars or shakes may be better for you. Or if you like to prepare meals, finding a plan that includes daily menus and recipes may be best.  Ask your doctor about other health professionals who can help you achieve your weight-loss goals.  A dietitian can help you make healthy changes in your diet.  An exercise specialist or personal trainer can help you develop a safe and effective exercise program.  A counselor or psychiatrist can help you cope with issues such as depression, anxiety, or family problems that can make it hard to focus on weight loss.  Consider joining a support group for people who are trying to lose weight. Your doctor can suggest groups in your area.  Where can you learn more?  Go to [employer] and enter U357 to learn more about Starting a Weight-Loss Plan: Care Instructions.  Current as of: June 29, 2023  Content Version: 14.6   2024-2025 [place], [place].   Care instructions adapted under license by [facility]. If you have questions about a medical condition or this instruction, always ask your healthcare professional. Romayne Alderman, [facility], disclaims any warranty or liability for your use of this information.         Advance Directives: Care Instructions  Overview  An advance directive is a legal way to state your wishes at the end of your life. It tells your loved ones and doctor what to do if you can't  say what you want.  There are two main types of advance directives. You can change them any time your wishes change.  Living will. This form tells your loved ones and doctor your wishes about life support and other treatment. The form is also called a declaration.  Medical power of attorney. This form lets you name a person to make treatment decisions for you when you can't speak for yourself. This person is called a health care agent (health care proxy, health care surrogate). The form is also called a durable power of attorney for health care.  If you do not have an advance directive, decisions about your medical care may be made by a family member or doctor who doesn't know you or by a judge.  It may help to think of an advance directive as a gift to the people who care for you. If you have one, they won't have to make tough decisions by themselves.  For more information, including forms for your state, see the [employer] website (plumberbiz.com.cy).  Follow-up care is a key part of your treatment and safety. Be sure to make and go to all appointments, and call your doctor if you are having problems. It's also a good idea to know your test results and keep a list of the medicines you take.  What should you include in an advance directive?  Many states have a unique advance directive form. (It may ask you to address specific issues.) Or you might use a universal form that's approved by many states.  If your form doesn't tell you what to address, it may be hard to know what to include in your advance directive. Use the questions below to help you get started.  Who do you want to make decisions about your medical care if you are not able to?  What life-support measures do you want if you have a serious illness that gets worse over time or can't be cured?  What are you most afraid of that might happen? (Maybe you're afraid of having pain, losing your independence, or being kept alive by  machines.)  Where would you prefer to die? (Your home? A hospital? A nursing home?)  Do you want to donate your organs when you die?  Do you want certain religious practices performed before you die?  When should you call for help?  Be sure to contact your doctor if you have any questions.  Where can you learn more?  Go to [employer] and enter R264 to learn more about Advance Directives: Care Instructions.  Current as of: October 29, 2023  Content Version: 14.6   2024-2025 [place], .   Care instructions adapted under license by [facility]. If you have questions about a medical condition or this  instruction, always ask your healthcare professional. Romayne Alderman, [facility], disclaims any warranty or liability for your use of this information.         A Healthy Heart: Care Instructions  Overview    Coronary artery disease, also called heart disease, occurs when a substance called plaque builds up in the vessels that supply oxygen-rich blood to your heart muscle. This can narrow the blood vessels and reduce blood flow. A heart attack happens when blood flow is completely blocked. A high-fat diet, smoking, and other factors increase the risk of heart disease.  Your doctor has found that you have a chance of having heart disease. A heart-healthy lifestyle can help keep your heart healthy and prevent heart disease. This lifestyle includes eating healthy, being active, staying at a weight that's healthy for you, and not smoking, vaping, or using other tobacco or nicotine products. It also includes taking medicines as directed, managing other health conditions, and trying to get a healthy amount of sleep.  Follow-up care is a key part of your treatment and safety. Be sure to make and go to all appointments, and contact your doctor if you are having problems. It's also a good idea to know your test results and keep a list of the medicines you take.  How can you care for yourself at  home?  Diet  Use less salt when you cook and eat. This helps lower your blood pressure. Taste food before salting. Add only a little salt when you think you need it. With time, your taste buds will adjust to less salt.  Eat fewer snack items, fast foods, canned soups, and other high-salt, high-fat, processed foods.  Read food labels and try to avoid saturated and trans fats. They increase your risk of heart disease by raising cholesterol levels.  Limit the amount of solid fat--butter, margarine, and shortening--you eat. Use olive, peanut, or canola oil when you cook. Bake, broil, and steam foods instead of frying them.  Eat a variety of fruit and vegetables every day. Dark green, deep orange, red, or yellow fruits and vegetables are especially good for you. Examples include spinach, carrots, peaches, and berries.  Foods high in fiber can reduce your cholesterol and provide important vitamins and minerals. High-fiber foods include whole-grain cereals and breads, oatmeal, beans, brown rice, citrus fruits, and apples.  Eat lean proteins. Heart-healthy proteins include seafood, lean meats and poultry, eggs, beans, peas, nuts, seeds, and soy products.  Limit drinks and foods with added sugar. These include candy, desserts, and soda pop.  Heart-healthy lifestyle  If your doctor recommends it, get more exercise. For many people, walking is a good choice. Or you may want to swim, bike, or do other activities. Bit by bit, increase the time you're active every day. Try for at least 30 minutes on most days of the week.  If you smoke, vape, or use other tobacco or nicotine products, try to quit. If you cant quit, cut back as much as you can. If you need help quitting, talk to your doctor about quit programs and medicines. Quitting is one of the most important things you can do to protect your heart. Also avoid secondhand smoke and the aerosol mist from vaping.  Stay at a weight that's healthy for you. Talk to your doctor if  you need help losing weight.  Try to get 7 to 9 hours of sleep each night.  Limit alcohol to 2 drinks a day for men and 1 drink a day  for women. Too much alcohol can cause health problems.  Manage other health problems such as diabetes, high blood pressure, and high cholesterol. If you think you may have a problem with alcohol or drug use, talk to your doctor.  Medicines  Take your medicines exactly as prescribed. Contact your doctor if you think you are having a problem with your medicine.  When should you call for help?  Call 911 if you have symptoms of a heart attack. These may include:  Chest pain or pressure, or a strange feeling in the chest.  Sweating.  Shortness of breath.  Pain, pressure, or a strange feeling in the back, neck, jaw, or upper belly or in one or both shoulders or arms.  Lightheadedness or sudden weakness.  A fast or irregular heartbeat.  After you call 911, the operator may tell you to chew 1 adult-strength or 2 to 4 low-dose aspirin . Wait for an ambulance. Do not try to drive yourself.  Watch closely for changes in your health, and be sure to contact your doctor if you have any problems.  Where can you learn more?  Go to [employer] and enter F075 to learn more about [facility] Healthy [facility]: Care Instructions.  Current as of: November 28, 2022  Content Version: 14.6   2024-2025 [place], [place].   Care instructions adapted under license by [facility]. If you have questions about a medical condition or this instruction, always ask your healthcare professional. Romayne Alderman, [facility], disclaims any warranty or liability for your use of this information.    Personalized Preventive Plan for Jodi Walls - 02/06/2024  Medicare offers a range of preventive health benefits. Some of the tests and screenings are paid in full while other may be subject to a deductible, co-insurance, and/or copay.  Some of these benefits include a comprehensive review of your medical history  including lifestyle, illnesses that may run in your family, and various assessments and screenings as appropriate.  After reviewing your medical record and screening and assessments performed today your provider may have ordered immunizations, labs, imaging, and/or referrals for you.  A list of these orders (if applicable) as well as your Preventive Care list are included within your After Visit Summary for your review.      "

## 2024-02-07 ENCOUNTER — Encounter

## 2024-02-07 LAB — TSH REFLEX TO FT4: TSH Reflex FT4: 21.2 u[IU]/mL — ABNORMAL HIGH (ref 0.27–4.20)

## 2024-02-07 LAB — T4, FREE: T4 Free: 0.9 ng/dL (ref 0.9–1.8)

## 2024-02-07 MED ORDER — LEVOTHYROXINE SODIUM 50 MCG PO TABS
50 | ORAL_TABLET | Freq: Every day | ORAL | 0 refills | 90.00000 days | Status: DC
Start: 2024-02-07 — End: 2024-02-07

## 2024-02-07 MED ORDER — LEVOTHYROXINE SODIUM 50 MCG PO TABS
50 | ORAL_TABLET | Freq: Every day | ORAL | 0 refills | 90.00000 days | Status: DC
Start: 2024-02-07 — End: 2024-04-21

## 2024-02-10 NOTE — Telephone Encounter (Signed)
"  Pt advised and verbalized understanding.    "

## 2024-02-20 MED ORDER — PANTOPRAZOLE SODIUM 40 MG PO TBEC
40 | ORAL_TABLET | Freq: Every day | ORAL | 1 refills | 30.00000 days | Status: AC
Start: 2024-02-20 — End: ?

## 2024-04-08 ENCOUNTER — Encounter

## 2024-04-21 ENCOUNTER — Encounter

## 2024-04-21 MED ORDER — LEVOTHYROXINE SODIUM 50 MCG PO TABS
50 | ORAL_TABLET | Freq: Every day | ORAL | 0 refills | 90.00000 days | Status: AC
Start: 2024-04-21 — End: ?

## 2024-05-06 ENCOUNTER — Encounter

## 2024-05-07 LAB — TSH REFLEX TO FT4: TSH Reflex FT4: 2.66 u[IU]/mL (ref 0.27–4.20)

## 2024-05-15 MED ORDER — ONDANSETRON 4 MG PO TBDP
4 | ORAL_TABLET | Freq: Three times a day (TID) | ORAL | 0 refills | 7.00000 days | Status: AC | PRN
Start: 2024-05-15 — End: ?

## 2024-05-15 NOTE — Telephone Encounter (Signed)
 This medication last filled one year ago.   Patient takes this for nausea. Patient has had increased nausea for approximately 1 week. This medication is effective in eliminating her symptom. Patient reports no vomiting.
